# Patient Record
Sex: Female | Born: 1940 | Race: Black or African American | Hispanic: No | Marital: Single | Smoking: Former smoker
Health system: Southern US, Community
[De-identification: ages and names within clinical notes are randomized; demographics above are authoritative.]

## PROBLEM LIST (undated history)

## (undated) ENCOUNTER — Emergency Department (HOSPITAL_COMMUNITY): Discharge status: Against Medical Advice | Payer: Medicare Other

## (undated) DIAGNOSIS — Z720 Tobacco use: Secondary | ICD-10-CM

## (undated) DIAGNOSIS — I255 Ischemic cardiomyopathy: Secondary | ICD-10-CM

## (undated) DIAGNOSIS — I10 Essential (primary) hypertension: Secondary | ICD-10-CM

## (undated) DIAGNOSIS — E119 Type 2 diabetes mellitus without complications: Secondary | ICD-10-CM

## (undated) DIAGNOSIS — R1310 Dysphagia, unspecified: Secondary | ICD-10-CM

## (undated) DIAGNOSIS — A0472 Enterocolitis due to Clostridium difficile, not specified as recurrent: Secondary | ICD-10-CM

## (undated) DIAGNOSIS — I639 Cerebral infarction, unspecified: Secondary | ICD-10-CM

## (undated) DIAGNOSIS — D649 Anemia, unspecified: Secondary | ICD-10-CM

## (undated) DIAGNOSIS — J449 Chronic obstructive pulmonary disease, unspecified: Secondary | ICD-10-CM

## (undated) DIAGNOSIS — J45909 Unspecified asthma, uncomplicated: Secondary | ICD-10-CM

## (undated) DIAGNOSIS — I252 Old myocardial infarction: Secondary | ICD-10-CM

## (undated) HISTORY — DX: Chronic obstructive pulmonary disease, unspecified: J44.9

## (undated) HISTORY — PX: APPENDECTOMY: SHX54

---

## 2009-07-13 DIAGNOSIS — I639 Cerebral infarction, unspecified: Secondary | ICD-10-CM

## 2009-07-13 HISTORY — DX: Cerebral infarction, unspecified: I63.9

## 2011-07-14 DIAGNOSIS — I252 Old myocardial infarction: Secondary | ICD-10-CM

## 2011-07-14 HISTORY — DX: Old myocardial infarction: I25.2

## 2012-06-26 ENCOUNTER — Inpatient Hospital Stay (HOSPITAL_COMMUNITY)
Admission: EM | Admit: 2012-06-26 | Discharge: 2012-07-04 | DRG: 208 | Disposition: A | Discharge status: Rehabilitation Facility | Payer: Medicare Other | Attending: Cardiovascular Disease | Admitting: Cardiovascular Disease

## 2012-06-26 ENCOUNTER — Emergency Department (HOSPITAL_COMMUNITY): Payer: Medicare Other

## 2012-06-26 ENCOUNTER — Encounter (HOSPITAL_COMMUNITY): Payer: Self-pay | Admitting: Emergency Medicine

## 2012-06-26 DIAGNOSIS — Z79899 Other long term (current) drug therapy: Secondary | ICD-10-CM

## 2012-06-26 DIAGNOSIS — IMO0002 Reserved for concepts with insufficient information to code with codable children: Secondary | ICD-10-CM

## 2012-06-26 DIAGNOSIS — K5900 Constipation, unspecified: Secondary | ICD-10-CM | POA: Diagnosis present

## 2012-06-26 DIAGNOSIS — F17200 Nicotine dependence, unspecified, uncomplicated: Secondary | ICD-10-CM | POA: Diagnosis present

## 2012-06-26 DIAGNOSIS — Z23 Encounter for immunization: Secondary | ICD-10-CM

## 2012-06-26 DIAGNOSIS — J45909 Unspecified asthma, uncomplicated: Secondary | ICD-10-CM | POA: Diagnosis present

## 2012-06-26 DIAGNOSIS — I959 Hypotension, unspecified: Secondary | ICD-10-CM

## 2012-06-26 DIAGNOSIS — J449 Chronic obstructive pulmonary disease, unspecified: Secondary | ICD-10-CM | POA: Diagnosis present

## 2012-06-26 DIAGNOSIS — J45901 Unspecified asthma with (acute) exacerbation: Secondary | ICD-10-CM | POA: Diagnosis present

## 2012-06-26 DIAGNOSIS — R5381 Other malaise: Secondary | ICD-10-CM | POA: Diagnosis present

## 2012-06-26 DIAGNOSIS — R1313 Dysphagia, pharyngeal phase: Secondary | ICD-10-CM | POA: Diagnosis present

## 2012-06-26 DIAGNOSIS — E119 Type 2 diabetes mellitus without complications: Secondary | ICD-10-CM

## 2012-06-26 DIAGNOSIS — I509 Heart failure, unspecified: Secondary | ICD-10-CM | POA: Diagnosis present

## 2012-06-26 DIAGNOSIS — Z720 Tobacco use: Secondary | ICD-10-CM | POA: Diagnosis present

## 2012-06-26 DIAGNOSIS — I5023 Acute on chronic systolic (congestive) heart failure: Secondary | ICD-10-CM

## 2012-06-26 DIAGNOSIS — I428 Other cardiomyopathies: Secondary | ICD-10-CM

## 2012-06-26 DIAGNOSIS — Z8673 Personal history of transient ischemic attack (TIA), and cerebral infarction without residual deficits: Secondary | ICD-10-CM

## 2012-06-26 DIAGNOSIS — I214 Non-ST elevation (NSTEMI) myocardial infarction: Secondary | ICD-10-CM

## 2012-06-26 DIAGNOSIS — R0902 Hypoxemia: Secondary | ICD-10-CM

## 2012-06-26 DIAGNOSIS — J9600 Acute respiratory failure, unspecified whether with hypoxia or hypercapnia: Secondary | ICD-10-CM

## 2012-06-26 DIAGNOSIS — I213 ST elevation (STEMI) myocardial infarction of unspecified site: Secondary | ICD-10-CM

## 2012-06-26 DIAGNOSIS — J441 Chronic obstructive pulmonary disease with (acute) exacerbation: Secondary | ICD-10-CM

## 2012-06-26 DIAGNOSIS — J9620 Acute and chronic respiratory failure, unspecified whether with hypoxia or hypercapnia: Principal | ICD-10-CM | POA: Diagnosis present

## 2012-06-26 DIAGNOSIS — J9690 Respiratory failure, unspecified, unspecified whether with hypoxia or hypercapnia: Secondary | ICD-10-CM

## 2012-06-26 HISTORY — DX: Essential (primary) hypertension: I10

## 2012-06-26 HISTORY — DX: Unspecified asthma, uncomplicated: J45.909

## 2012-06-26 HISTORY — DX: Type 2 diabetes mellitus without complications: E11.9

## 2012-06-26 HISTORY — DX: Cerebral infarction, unspecified: I63.9

## 2012-06-26 HISTORY — DX: Tobacco use: Z72.0

## 2012-06-26 LAB — CBC WITH DIFFERENTIAL/PLATELET
Eosinophils Relative: 3 % (ref 0–5)
Lymphocytes Relative: 21 % (ref 12–46)
Lymphs Abs: 3.1 10*3/uL (ref 0.7–4.0)
MCV: 91.5 fL (ref 78.0–100.0)
Neutro Abs: 10.3 10*3/uL — ABNORMAL HIGH (ref 1.7–7.7)
Neutrophils Relative %: 71 % (ref 43–77)
Platelets: 337 10*3/uL (ref 150–400)
RBC: 4.35 MIL/uL (ref 3.87–5.11)
WBC: 14.6 10*3/uL — ABNORMAL HIGH (ref 4.0–10.5)

## 2012-06-26 MED ORDER — SODIUM CHLORIDE 0.9 % IV BOLUS (SEPSIS)
2000.0000 mL | Freq: Once | INTRAVENOUS | Status: AC
  Administered 2012-06-26: 1000 mL via INTRAVENOUS

## 2012-06-26 MED ORDER — DEXTROSE 5 % IV SOLN
1.0000 g | Freq: Once | INTRAVENOUS | Status: AC
  Administered 2012-06-26: 1 g via INTRAVENOUS
  Filled 2012-06-26: qty 10

## 2012-06-26 MED ORDER — METHYLPREDNISOLONE SODIUM SUCC 125 MG IJ SOLR
125.0000 mg | Freq: Once | INTRAMUSCULAR | Status: AC
  Administered 2012-06-26: 125 mg via INTRAVENOUS
  Filled 2012-06-26: qty 2

## 2012-06-26 MED ORDER — IPRATROPIUM BROMIDE 0.02 % IN SOLN
0.5000 mg | Freq: Once | RESPIRATORY_TRACT | Status: AC
  Administered 2012-06-26: 0.5 mg via RESPIRATORY_TRACT
  Filled 2012-06-26: qty 2.5

## 2012-06-26 MED ORDER — SODIUM CHLORIDE 0.9 % IV SOLN
INTRAVENOUS | Status: DC
  Administered 2012-06-27 – 2012-06-28 (×3): via INTRAVENOUS

## 2012-06-26 MED ORDER — ASPIRIN 81 MG PO CHEW
324.0000 mg | CHEWABLE_TABLET | Freq: Once | ORAL | Status: AC
  Administered 2012-06-26: 324 mg via ORAL
  Filled 2012-06-26: qty 4

## 2012-06-26 MED ORDER — DEXTROSE 5 % IV SOLN: 500.0000 mg | Freq: Once | INTRAVENOUS | Status: DC

## 2012-06-26 MED ORDER — ALBUTEROL SULFATE (5 MG/ML) 0.5% IN NEBU
10.0000 mg | INHALATION_SOLUTION | Freq: Once | RESPIRATORY_TRACT | Status: AC
  Administered 2012-06-26: 10 mg via RESPIRATORY_TRACT
  Filled 2012-06-26: qty 2

## 2012-06-26 MED ORDER — HEPARIN SODIUM (PORCINE) 5000 UNIT/ML IJ SOLN
60.0000 [IU]/kg | INTRAMUSCULAR | Status: AC
  Administered 2012-06-26: 3500 [IU] via INTRAVENOUS
  Filled 2012-06-26: qty 1

## 2012-06-26 MED ORDER — SODIUM CHLORIDE 0.9 % IV SOLN
INTRAVENOUS | Status: DC
  Administered 2012-06-27 – 2012-06-29 (×2): via INTRAVENOUS

## 2012-06-26 NOTE — ED Notes (Signed)
Patient with severe respiratory distress. States started suddenly tonight.

## 2012-06-26 NOTE — ED Provider Notes (Signed)
History   This chart was scribed for Hurman Horn, MD, by Frederik Pear, ER scribe. The patient was seen in room APA19/APA19 and the patient's care was started at 2311.    CSN: [id number]  Arrival date & time 06/26/12  2259   First MD Initiated Contact with Patient 06/26/12 2311      Chief Complaint  Patient presents with  . Respiratory Distress    (Consider location/radiation/quality/duration/timing/severity/associated sxs/prior treatment) HPI Comments: Christine Durham is a 71 y.o. female who presents to the Emergency Department complaining of gradually worsening, constant SOB that began around 1700. She has a h/o of mild asthma and denies any chronic health conditions except for mild asthma. She denies any nausea, emesis, diarrhea, confusion, hematochezia, or fever. She reports that she is a current, every day smoker, but has cut back recently. She is a Level 5 Caveat due to her unstable vital signs. No CP, fever, edema.  No treatment PTA.  SOB severe, started gradually.  Past Medical History  Diagnosis Date  . Stroke   . Diabetes mellitus without complication   . Asthma     Past Surgical History  Procedure Date  . Appendectomy     History reviewed. No pertinent family history.  History  Substance Use Topics  . Smoking status: Current Every Day Smoker  . Smokeless tobacco: Not on file  . Alcohol Use: No    OB History    Grav Para Term Preterm Abortions TAB SAB Ect Mult Living                  Review of Systems  Unable to perform ROS: Unstable vital signs    Allergies  Review of patient's allergies indicates no known allergies.  Home Medications  No current outpatient prescriptions on file.  BP 150/82  Pulse 116  Resp 31  Wt 129 lb (58.514 kg)  SpO2 100%  Physical Exam  Nursing note and vitals reviewed. Constitutional:       Awake, alert, nontoxic appearance.  HENT:  Head: Atraumatic.  Eyes: Right eye exhibits no discharge. Left eye exhibits no  discharge.  Neck: Neck supple.  Pulmonary/Chest: Effort normal. She has wheezes. She exhibits no tenderness.       She is hypoxic. Her pulse oximeter was in the 50's on room and and in the 80's on a 100% mask.  Abdominal: Soft. There is no tenderness. There is no rebound.  Musculoskeletal: She exhibits no edema and no tenderness.       Baseline ROM, no obvious new focal weakness.  Neurological:       Mental status and motor strength appears baseline for patient and situation.  Skin: No rash noted.  Psychiatric: She has a normal mood and affect.  Moderately severe resp distress upon arrival with hypoxia.  ED Course  Procedures (including critical care time) ECG: Sinus tachycardia, ventricular rate 119, left axis deviation, left anterior fascicular block, questionable slight ST elevation inferior leads versus respiratory variation, 2 mm ST elevation in leads V3 through V5, Q waves in V3 and V4,  No comparison ECG available DIAGNOSTIC STUDIES: Oxygen Saturation is 81% on 100% mask, low by my interpretation.    COORDINATION OF CARE:  23:14- Patient / Family / Caregiver understand and agree with initial ED impression and plan with expectations set for ED visit.  Code STEMI activated as soon as ECG shown to me; Pt arrived with several hours of severe respiratory distress and hypoxia requiring emergent intervention with Bipap  and breathing treatment, also without CP, so ECG not obtained immediately upon arrival.  CRITICAL CARE Performed by: Hurman Horn   Total critical care time:  Critical care time was exclusive of separately billable procedures and treating other patients.  Critical care was necessary to treat or prevent imminent or life-threatening deterioration.  Critical care was time spent personally by me on the following activities: development of treatment plan with patient and/or surrogate as well as nursing, discussions with consultants, evaluation of patient's response  to treatment, examination of patient, obtaining history from patient or surrogate, ordering and performing treatments and interventions, ordering and review of laboratory studies, ordering and review of radiographic studies, pulse oximetry and re-evaluation of patient's condition.  Labs Reviewed  CBC WITH DIFFERENTIAL - Abnormal; Notable for the following:    WBC 14.6 (*)     Neutro Abs 10.3 (*)     All other components within normal limits  COMPREHENSIVE METABOLIC PANEL - Abnormal; Notable for the following:    Glucose, Bld 217 (*)     Total Bilirubin 0.2 (*)     All other components within normal limits  TROPONIN I - Abnormal; Notable for the following:    Troponin I 0.39 (*)     All other components within normal limits  PRO B NATRIURETIC PEPTIDE - Abnormal; Notable for the following:    Pro B Natriuretic peptide (BNP) 150.2 (*)     All other components within normal limits  LACTIC ACID, PLASMA - Abnormal; Notable for the following:    Lactic Acid, Venous 2.4 (*)     All other components within normal limits  PROTIME-INR  APTT  CULTURE, BLOOD (ROUTINE X 2)  CULTURE, BLOOD (ROUTINE X 2)  URINALYSIS, ROUTINE W REFLEX MICROSCOPIC  URINE CULTURE   Dg Chest Port 1 View  06/26/2012  *RADIOLOGY REPORT*  Clinical Data: Shortness of breath.  PORTABLE CHEST - 1 VIEW  Comparison: None.  Findings: Adequately inflated without focal consolidation or effusion.  There is mild prominence of the perihilar markings which may be due to minimal vascular congestion.  Cardiomediastinal silhouette is within normal.  There is minimal spondylosis of the spine.  IMPRESSION: Suggestion of minimal vascular congestion.   Original Report Authenticated By: Elberta Fortis, M.D.      1. STEMI (ST elevation myocardial infarction)   2. Respiratory failure   3. Hypoxia   4. COPD exacerbation       MDM  The patient appears reasonably stabilized for transfer considering the current resources, flow, and  capabilities available in the ED at this time, and I doubt any other [facility] requiring further screening and/or treatment in the ED prior to transfer.  I personally performed the services described in this documentation, which was scribed in my presence. The recorded information has been reviewed and is accurate.        Hurman Horn, MD 06/27/12 2232

## 2012-06-27 ENCOUNTER — Emergency Department (HOSPITAL_COMMUNITY): Payer: Medicare Other

## 2012-06-27 ENCOUNTER — Inpatient Hospital Stay (HOSPITAL_COMMUNITY): Payer: Medicare Other

## 2012-06-27 ENCOUNTER — Encounter (HOSPITAL_COMMUNITY): Payer: Self-pay | Admitting: Anesthesiology

## 2012-06-27 ENCOUNTER — Encounter (HOSPITAL_COMMUNITY)
Admission: EM | Disposition: A | Discharge status: Rehabilitation Facility | Payer: Self-pay | Source: Home / Self Care | Attending: Cardiovascular Disease

## 2012-06-27 DIAGNOSIS — R0902 Hypoxemia: Secondary | ICD-10-CM

## 2012-06-27 DIAGNOSIS — I509 Heart failure, unspecified: Secondary | ICD-10-CM

## 2012-06-27 DIAGNOSIS — I2510 Atherosclerotic heart disease of native coronary artery without angina pectoris: Secondary | ICD-10-CM

## 2012-06-27 DIAGNOSIS — I219 Acute myocardial infarction, unspecified: Secondary | ICD-10-CM

## 2012-06-27 DIAGNOSIS — J441 Chronic obstructive pulmonary disease with (acute) exacerbation: Secondary | ICD-10-CM

## 2012-06-27 DIAGNOSIS — I5023 Acute on chronic systolic (congestive) heart failure: Secondary | ICD-10-CM

## 2012-06-27 DIAGNOSIS — J9600 Acute respiratory failure, unspecified whether with hypoxia or hypercapnia: Secondary | ICD-10-CM

## 2012-06-27 HISTORY — PX: LEFT HEART CATHETERIZATION WITH CORONARY ANGIOGRAM: SHX5451

## 2012-06-27 LAB — COMPREHENSIVE METABOLIC PANEL
ALT: 15 U/L (ref 0–35)
ALT: 11 U/L (ref 0–35)
AST: 15 U/L (ref 0–37)
Albumin: 2.3 g/dL — ABNORMAL LOW (ref 3.5–5.2)
Alkaline Phosphatase: 116 U/L (ref 39–117)
Alkaline Phosphatase: 77 U/L (ref 39–117)
BUN: 8 mg/dL (ref 6–23)
CO2: 28 mEq/L (ref 19–32)
Chloride: 97 mEq/L (ref 96–112)
Chloride: 106 mEq/L (ref 96–112)
GFR calc Af Amer: >90 mL/min (ref >90)
GFR calc non Af Amer: >90 mL/min (ref >90)
Glucose, Bld: 217 mg/dL — ABNORMAL HIGH (ref 70–99)
Potassium: 3.6 mEq/L (ref 3.5–5.1)
Potassium: 4 mEq/L (ref 3.5–5.1)
Sodium: 136 mEq/L (ref 135–145)
Sodium: 135 mEq/L (ref 135–145)
Total Bilirubin: 0.1 mg/dL — ABNORMAL LOW (ref 0.3–1.2)
Total Protein: 5.2 g/dL — ABNORMAL LOW (ref 6.0–8.3)

## 2012-06-27 LAB — URINALYSIS, ROUTINE W REFLEX MICROSCOPIC
Bilirubin Urine: NEGATIVE
Bilirubin Urine: NEGATIVE
Glucose, UA: 250 mg/dL — AB
Ketones, ur: NEGATIVE mg/dL
Ketones, ur: NEGATIVE mg/dL
Leukocytes, UA: NEGATIVE
Protein, ur: 30 mg/dL — AB
Specific Gravity, Urine: 1.03 (ref 1.005–1.030)
Specific Gravity, Urine: 1.042 — ABNORMAL HIGH (ref 1.005–1.030)
Urobilinogen, UA: 0.2 mg/dL (ref 0.0–1.0)
pH: 5 (ref 5.0–8.0)

## 2012-06-27 LAB — URINE MICROSCOPIC-ADD ON

## 2012-06-27 LAB — BLOOD GAS, ARTERIAL
Bicarbonate: 22.2 mEq/L (ref 20.0–24.0)
PEEP: 5 cmH2O
pCO2 arterial: 53.7 mmHg — ABNORMAL HIGH (ref 35.0–45.0)
pH, Arterial: 7.24 — ABNORMAL LOW (ref 7.350–7.450)
pO2, Arterial: 189 mmHg — ABNORMAL HIGH (ref 80.0–100.0)

## 2012-06-27 LAB — TROPONIN I
Troponin I: 1.63 ng/mL (ref <0.30)
Troponin I: 1.74 ng/mL (ref <0.30)
Troponin I: 1.64 ng/mL (ref <0.30)

## 2012-06-27 LAB — CBC
HCT: 36 % (ref 36.0–46.0)
HCT: 30 % — ABNORMAL LOW (ref 36.0–46.0)
Hemoglobin: 11.7 g/dL — ABNORMAL LOW (ref 12.0–15.0)
RBC: 3.93 MIL/uL (ref 3.87–5.11)
RDW: 13.7 % (ref 11.5–15.5)
WBC: 12 10*3/uL — ABNORMAL HIGH (ref 4.0–10.5)

## 2012-06-27 LAB — POCT ACTIVATED CLOTTING TIME: Activated Clotting Time: 187 seconds

## 2012-06-27 LAB — CREATININE, SERUM
Creatinine, Ser: 0.6 mg/dL (ref 0.50–1.10)
GFR calc non Af Amer: >90 mL/min (ref >90)

## 2012-06-27 LAB — APTT: aPTT: 30 seconds (ref 24–37)

## 2012-06-27 LAB — GLUCOSE, CAPILLARY
Glucose-Capillary: 119 mg/dL — ABNORMAL HIGH (ref 70–99)
Glucose-Capillary: 139 mg/dL — ABNORMAL HIGH (ref 70–99)
Glucose-Capillary: 133 mg/dL — ABNORMAL HIGH (ref 70–99)

## 2012-06-27 SURGERY — LEFT HEART CATHETERIZATION WITH CORONARY ANGIOGRAM
Anesthesia: LOCAL | Laterality: Right

## 2012-06-27 MED ORDER — GLUCERNA 1.2 CAL PO LIQD
1000.0000 mL | ORAL | Status: DC
  Administered 2012-06-27 – 2012-06-28 (×2): 1000 mL
  Filled 2012-06-27 (×4): qty 1000

## 2012-06-27 MED ORDER — PRO-STAT SUGAR FREE PO LIQD
30.0000 mL | Freq: Four times a day (QID) | ORAL | Status: DC
  Administered 2012-06-27 – 2012-06-28 (×7): 30 mL
  Filled 2012-06-27 (×11): qty 30

## 2012-06-27 MED ORDER — INSULIN ASPART 100 UNIT/ML ~~LOC~~ SOLN
0.0000 [IU] | SUBCUTANEOUS | Status: DC
  Administered 2012-06-27 (×2): 2 [IU] via SUBCUTANEOUS
  Administered 2012-06-27: 8 [IU] via SUBCUTANEOUS
  Administered 2012-06-27: 5 [IU] via SUBCUTANEOUS
  Administered 2012-06-27: 3 [IU] via SUBCUTANEOUS
  Administered 2012-06-28: 2 [IU] via SUBCUTANEOUS
  Administered 2012-06-28: 3 [IU] via SUBCUTANEOUS
  Administered 2012-06-28: 2 [IU] via SUBCUTANEOUS
  Administered 2012-06-28: 3 [IU] via SUBCUTANEOUS
  Administered 2012-06-28 (×2): 2 [IU] via SUBCUTANEOUS
  Administered 2012-06-29: 3 [IU] via SUBCUTANEOUS
  Administered 2012-06-29 (×4): 2 [IU] via SUBCUTANEOUS
  Administered 2012-06-29 – 2012-06-30 (×2): 3 [IU] via SUBCUTANEOUS
  Administered 2012-06-30: 11 [IU] via SUBCUTANEOUS

## 2012-06-27 MED ORDER — LEVALBUTEROL TARTRATE 45 MCG/ACT IN AERO
6.0000 | INHALATION_SPRAY | Freq: Four times a day (QID) | RESPIRATORY_TRACT | Status: DC
  Administered 2012-06-27 – 2012-06-29 (×9): 6 via RESPIRATORY_TRACT

## 2012-06-27 MED ORDER — SODIUM CHLORIDE 0.9 % IV SOLN: 250.0000 mL | INTRAVENOUS | Status: DC | PRN

## 2012-06-27 MED ORDER — ADULT MULTIVITAMIN LIQUID CH
5.0000 mL | Freq: Every day | ORAL | Status: DC
  Administered 2012-06-27 – 2012-06-30 (×3): 5 mL
  Filled 2012-06-27 (×4): qty 5

## 2012-06-27 MED ORDER — IPRATROPIUM BROMIDE HFA 17 MCG/ACT IN AERS
2.0000 | INHALATION_SPRAY | RESPIRATORY_TRACT | Status: DC
  Filled 2012-06-27: qty 12.9

## 2012-06-27 MED ORDER — METHYLPREDNISOLONE SODIUM SUCC 125 MG IJ SOLR
60.0000 mg | Freq: Two times a day (BID) | INTRAMUSCULAR | Status: DC
  Administered 2012-06-27 (×2): 60 mg via INTRAVENOUS
  Administered 2012-06-28: 10:00:00 via INTRAVENOUS
  Administered 2012-06-28 – 2012-06-30 (×4): 60 mg via INTRAVENOUS
  Filled 2012-06-27 (×4): qty 0.96
  Filled 2012-06-27: qty 2
  Filled 2012-06-27 (×4): qty 0.96

## 2012-06-27 MED ORDER — MIDAZOLAM BOLUS VIA INFUSION
1.0000 mg | INTRAVENOUS | Status: DC | PRN
  Filled 2012-06-27: qty 2

## 2012-06-27 MED ORDER — SODIUM CHLORIDE 0.9 % IV SOLN: 1.0000 mg/h | INTRAVENOUS | Status: DC

## 2012-06-27 MED ORDER — SODIUM CHLORIDE 0.9 % IV SOLN
1.0000 mg/h | INTRAVENOUS | Status: DC
  Administered 2012-06-27: 2 mg/h via INTRAVENOUS
  Administered 2012-06-27: 4 mg/h via INTRAVENOUS
  Administered 2012-06-28 (×2): 2 mg/h via INTRAVENOUS
  Filled 2012-06-27 (×6): qty 10

## 2012-06-27 MED ORDER — LIDOCAINE HCL (PF) 1 % IJ SOLN
INTRAMUSCULAR | Status: AC
  Filled 2012-06-27: qty 30

## 2012-06-27 MED ORDER — IPRATROPIUM BROMIDE HFA 17 MCG/ACT IN AERS
6.0000 | INHALATION_SPRAY | Freq: Four times a day (QID) | RESPIRATORY_TRACT | Status: DC
  Administered 2012-06-27 – 2012-06-29 (×10): 6 via RESPIRATORY_TRACT

## 2012-06-27 MED ORDER — HEPARIN (PORCINE) IN NACL 2-0.9 UNIT/ML-% IJ SOLN
INTRAMUSCULAR | Status: AC
  Filled 2012-06-27: qty 1000

## 2012-06-27 MED ORDER — INSULIN ASPART 100 UNIT/ML ~~LOC~~ SOLN: 1.0000 [IU] | SUBCUTANEOUS | Status: DC | PRN

## 2012-06-27 MED ORDER — BIOTENE DRY MOUTH MT LIQD
15.0000 mL | Freq: Four times a day (QID) | OROMUCOSAL | Status: DC
  Administered 2012-06-27 – 2012-06-29 (×9): 15 mL via OROMUCOSAL

## 2012-06-27 MED ORDER — SODIUM CHLORIDE 0.9 % IV SOLN
25.0000 ug/h | INTRAVENOUS | Status: DC
  Administered 2012-06-27: 75 ug/h via INTRAVENOUS
  Administered 2012-06-28: 50 ug/h via INTRAVENOUS
  Filled 2012-06-27 (×2): qty 50

## 2012-06-27 MED ORDER — SODIUM CHLORIDE 0.9 % IJ SOLN: 3.0000 mL | INTRAMUSCULAR | Status: DC | PRN

## 2012-06-27 MED ORDER — ASPIRIN-DIPYRIDAMOLE ER 25-200 MG PO CP12
1.0000 | ORAL_CAPSULE | Freq: Two times a day (BID) | ORAL | Status: DC
  Administered 2012-06-27 – 2012-07-04 (×13): 1 via ORAL
  Filled 2012-06-27 (×22): qty 1

## 2012-06-27 MED ORDER — SODIUM CHLORIDE 0.9 % IJ SOLN
3.0000 mL | Freq: Two times a day (BID) | INTRAMUSCULAR | Status: DC
  Administered 2012-06-27 (×2): 3 mL via INTRAVENOUS
  Administered 2012-06-28: 10:00:00 via INTRAVENOUS
  Administered 2012-06-29 – 2012-06-30 (×4): 3 mL via INTRAVENOUS
  Administered 2012-07-01: 10:00:00 via INTRAVENOUS
  Administered 2012-07-01 – 2012-07-04 (×6): 3 mL via INTRAVENOUS

## 2012-06-27 MED ORDER — INSULIN ASPART 100 UNIT/ML ~~LOC~~ SOLN: 2.0000 [IU] | SUBCUTANEOUS | Status: DC

## 2012-06-27 MED ORDER — ALBUTEROL SULFATE (5 MG/ML) 0.5% IN NEBU
2.5000 mg | INHALATION_SOLUTION | Freq: Once | RESPIRATORY_TRACT | Status: AC
  Administered 2012-06-27: 2.5 mg via RESPIRATORY_TRACT
  Filled 2012-06-27: qty 0.5

## 2012-06-27 MED ORDER — MIDAZOLAM HCL 2 MG/2ML IJ SOLN
INTRAMUSCULAR | Status: AC
  Filled 2012-06-27: qty 2

## 2012-06-27 MED ORDER — NITROGLYCERIN 0.2 MG/ML ON CALL CATH LAB
INTRAVENOUS | Status: AC
  Filled 2012-06-27: qty 1

## 2012-06-27 MED ORDER — DEXTROSE 5 % IV SOLN
1.0000 g | INTRAVENOUS | Status: DC
  Administered 2012-06-27 – 2012-07-01 (×5): 1 g via INTRAVENOUS
  Filled 2012-06-27 (×8): qty 10

## 2012-06-27 MED ORDER — SODIUM CHLORIDE 0.9 % IV SOLN: 10.0000 ug/h | INTRAVENOUS | Status: DC

## 2012-06-27 MED ORDER — CHLORHEXIDINE GLUCONATE 0.12 % MT SOLN
15.0000 mL | Freq: Two times a day (BID) | OROMUCOSAL | Status: DC
  Administered 2012-06-27 – 2012-06-29 (×5): 15 mL via OROMUCOSAL
  Filled 2012-06-27 (×5): qty 15

## 2012-06-27 MED ORDER — ENOXAPARIN SODIUM 40 MG/0.4ML ~~LOC~~ SOLN
40.0000 mg | SUBCUTANEOUS | Status: DC
  Administered 2012-06-27 – 2012-07-03 (×7): 40 mg via SUBCUTANEOUS
  Filled 2012-06-27 (×8): qty 0.4

## 2012-06-27 MED ORDER — SODIUM CHLORIDE 0.9 % IV SOLN: 750.0000 mL | INTRAVENOUS | Status: DC | PRN

## 2012-06-27 MED ORDER — LEVALBUTEROL TARTRATE 45 MCG/ACT IN AERO
6.0000 | INHALATION_SPRAY | RESPIRATORY_TRACT | Status: DC | PRN
  Administered 2012-06-27: 6 via RESPIRATORY_TRACT
  Filled 2012-06-27: qty 15

## 2012-06-27 MED ORDER — METHYLPREDNISOLONE SODIUM SUCC 1000 MG IJ SOLR: 1000.0000 mg | Freq: Four times a day (QID) | INTRAMUSCULAR | Status: DC

## 2012-06-27 MED ORDER — FENTANYL BOLUS VIA INFUSION
25.0000 ug | Freq: Four times a day (QID) | INTRAVENOUS | Status: DC | PRN
  Filled 2012-06-27: qty 100

## 2012-06-27 MED ORDER — DEXTROSE 5 % IV SOLN
500.0000 mg | INTRAVENOUS | Status: DC
  Administered 2012-06-27 – 2012-07-01 (×5): 500 mg via INTRAVENOUS
  Filled 2012-06-27 (×6): qty 500

## 2012-06-27 MED ORDER — LEVALBUTEROL TARTRATE 45 MCG/ACT IN AERO
2.0000 | INHALATION_SPRAY | Freq: Four times a day (QID) | RESPIRATORY_TRACT | Status: DC
  Filled 2012-06-27: qty 15

## 2012-06-27 MED ORDER — PANTOPRAZOLE SODIUM 40 MG IV SOLR
40.0000 mg | Freq: Every day | INTRAVENOUS | Status: DC
  Administered 2012-06-27 – 2012-06-28 (×2): 40 mg via INTRAVENOUS
  Filled 2012-06-27 (×2): qty 40

## 2012-06-27 NOTE — Consult Note (Signed)
Name: Christine Durham MRN: [id number] DOB: 09-23-1940    LOS: 1  Referring Provider:  Cardiology Reason for Referral:  VDRF  PULMONARY / CRITICAL CARE MEDICINE  HPI:  71 years old female with PMH relevant for asthma, smoking, DM and stroke. Patient unable to provide history due to intubation and mechanical ventilation. From the ED records she presented with progressive SOB that started at about 5 pm. EKG done in the ED showed STEMI in the anterolateral leads with initial troponin of 0.39. Apparently the patient had significant wheezing and was hypoxemic. Cardiology decided to proceed with intubation before proceeding with the cardiac catheterization. Critical care consult was called to assist with management of COPD Asthma and mechanical ventilation. At the time of my exam the patient is intubated, sedated. Hemodynamically stable. Cardiac cath did not show obstructive CAD. LVEF is 20%  Vital Signs: Temp:  [97.5 F (36.4 C)-97.7 F (36.5 C)] 97.5 F (36.4 C) (12/16 0800) Pulse Rate:  [79-121] 81  (12/16 1100) Resp:  [14-38] 16  (12/16 1100) BP: (79-162)/(41-139) 91/53 mmHg (12/16 1100) SpO2:  [81 %-100 %] 99 % (12/16 1100) FiO2 (%):  [40 %-100 %] 40 % (12/16 0800) Weight:  [54.9 kg (121 lb 0.5 oz)-58.514 kg (129 lb)] 54.9 kg (121 lb 0.5 oz) (12/16 0240)  Physical Examination: General:  Chronically ill appearing female, NAD. Neuro:  Arousable and follows commands. HEENT:  [place]/AT, PERRL, EOM-I and MMM. Neck:  Supple, -LAN and -thyromegally. Cardiovascular:  RRR, Nl S1/S2, -M/R/G. Lungs:  Coarse BS diffusely. Abdomen:  Soft, NT, ND and +BS. Musculoskeletal:  -edema and -tenderness. Skin:  Intact.  Principal Problem:  *Acute on chronic systolic CHF (congestive heart failure)   ASSESSMENT AND PLAN  PULMONARY  Lab 06/27/12 0400  PHART 7.240*  PCO2ART 53.7*  PO2ART 189.0*  HCO3 22.2  O2SAT 99.1   Ventilator Settings: Vent Mode:  [-] PRVC FiO2 (%):  [40 %-100 %] 40 % Set Rate:   [14 bmp-16 bmp] 16 bmp Vt Set:  [420 mL-500 mL] 420 mL PEEP:  [5 cmH20] 5 cmH20 Plateau Pressure:  [12 cmH20-20 cmH20] 12 cmH20 CXR:  ET tube ok, pulmonary edema. ETT:  12/15>>>  A:  VDRF secondary to pulmonary edema and laying flat as well as underlying pulmonary disease. P:   - Hold pressure support trials for today with active MI. - F/U ABG. - Active diureses. - BD as ordered.  CARDIOVASCULAR  Lab 06/27/12 0934 06/27/12 0251 06/26/12 2321 06/26/12 2320  TROPONINI 1.74* 1.63* -- 0.39*  LATICACIDVEN -- -- 2.4* --  PROBNP -- -- -- 150.2*   ECG:  Per cards. Lines: PIV  A: NSTEMI, nonobstructive lesions seen on cath.  Troponin continue to rise however and patient is aneuric. P:  - Per cards. - Monitor troponins.  RENAL  Lab 06/27/12 0250 06/26/12 2320  NA -- 136  K -- 3.6  CL -- 97  CO2 -- 28  BUN -- 7  CREATININE 0.60 0.50  CALCIUM -- 9.6  MG -- --  PHOS -- --   Intake/Output      12/15 0701 - 12/16 0700 12/16 0701 - 12/17 0700   I.V. (mL/kg) 510.5 (9.3) 513 (9.3)   IV Piggyback 250    Total Intake(mL/kg) 760.5 (13.9) 513 (9.3)   Urine (mL/kg/hr) 185 (0.1) 50 (0.2)   Total Output 185 50   Net +575.5 +463          Intake/Output Summary (Last 24 hours) at 06/27/12 1148 Last data filed  at 06/27/12 1100  Gross per 24 hour  Intake 1273.46 ml  Output    235 ml  Net 1038.46 ml   Foley:  12/15>>>  A:  Patient completely aneuric, cr ok and electrolytes WNL. P:   - Bladder scan, if negative then will order U/S of kidneys and hydrate more to avoid contrast nephropathy. - Renal U/S.  GASTROINTESTINAL  Lab 06/26/12 2320  AST 13  ALT 15  ALKPHOS 116  BILITOT 0.2*  PROT 8.0  ALBUMIN 3.8   A:  No acute P:   - TF per nutrition.  HEMATOLOGIC  Lab 06/27/12 0250 06/26/12 2320  HGB 11.7* 13.2  HCT 36.0 39.8  PLT 295 337  INR -- 0.97  APTT -- 30   A:  No active issues. P:  Daily CBC.  INFECTIOUS  Lab 06/27/12 0250 06/26/12 2320  WBC 15.6*  14.6*  PROCALCITON -- --   Cultures: Blood 12/16>>> Urine 12/16>>> Sputum 12/16>>> Antibiotics: Rocephin 12/16>>> Zithomax 12/16>>>  A:  ?CAP P:   - Pan culture. - F/U on culture. - Rocephin and zithromax.  ENDOCRINE  Lab 06/27/12 1120 06/27/12 0758 06/27/12 0324  GLUCAP 166* 235* 255*   A:  DM history.   P:   CBG and SSI.  NEUROLOGIC  A:  Sedation protocol. P:   Intermittent sedation.  CC time 50 min.  Koren Bound, M.D. Pulmonary and Critical Care Medicine [facility] Pager: [phone]  06/27/2012, 11:32 AM

## 2012-06-27 NOTE — Consult Note (Signed)
Name: Christine Durham MRN: [id number] DOB: 1940/09/14    LOS: 1  PULMONARY / CRITICAL CARE MEDICINE  HPI:   71 years old female with PMH relevant for asthma, smoking, DM and stroke. Patient unable to provide history due to intubation and mechanical ventilation. From the ED records she presented with progressive SOB that started at about 5 pm. EKG done in the ED showed STEMI in the anterolateral leads with initial troponin of 0.39. Apparently the patient had significant wheezing and was hypoxemic. Cardiology decided to proceed with intubation before proceeding with the cardiac catheterization. Critical care consult was called to assist with management of COPD  Asthma and mechanical ventilation. At the time of my exam the patient is intubated, sedated. Hemodynamically stable. Cardiac cath did not show obstructive CAD. LVEF is 20%  Past Medical History  Diagnosis Date  . Stroke   . Diabetes mellitus without complication   . Asthma    Past Surgical History  Procedure Date  . Appendectomy    Prior to Admission medications   Not on File   Allergies No Known Allergies  Family History History reviewed. No pertinent family history. Social History  reports that she has been smoking.  She does not have any smokeless tobacco history on file. She reports that she does not drink alcohol or use illicit drugs.  Review Of Systems:  Unable to provide.   Vital Signs: Pulse Rate:  [115-121] 116  (12/15 2342) Resp:  [30-38] 31  (12/15 2342) BP: (150-151)/(82-139) 150/82 mmHg (12/15 2342) SpO2:  [81 %-100 %] 100 % (12/15 2342) FiO2 (%):  [100 %] 100 % (12/15 2330) Weight:  [129 lb (58.514 kg)] 129 lb (58.514 kg) (12/15 2312)  Physical Examination: General:  Intubated, mechanically ventilated, no acute distress Neuro:  Sedated, synchronous, nonfocal HEENT:  PERRL, pink conjunctivae, moist membranes Neck:  Supple, no JVD   Cardiovascular:  RRR, no M/R/G Lungs:  CTA, mild bilateral  wheezing.. Abdomen:  Soft, nontender, nondistended, bowel sounds present Musculoskeletal:  Moves all extremities, no pedal edema Skin:  No rash  Laboratory data:  Lab 06/26/12 2321 06/26/12 2320 TROPONINI -- 0.39* LATICACIDVEN 2.4* -- PROBNP -- 150.2*   Lab 06/26/12 2320 NA 136 K 3.6 CL 97 CO2 28 BUN 7 CREATININE 0.50 CALCIUM 9.6 MG -- PHOS --   Lab 06/26/12 2320 AST 13 ALT 15 ALKPHOS 116 BILITOT 0.2* PROT 8.0 ALBUMIN 3.8   Lab 06/26/12 2320 HGB 13.2 HCT 39.8 PLT 337 INR 0.97 APTT 30   Lab 06/26/12 2320 WBC 14.6* PROCALCITON --   CXR:  No acute infiltrates ETT:  Adequate position  ASSESSMENT: 71 years old female with PMH relevant for asthma, smoking, DM and stroke. Presents with progressive SOB and hypoxemia. EKG with STEMI in the anterolateral leads and troponin elevation. Patient with diffuse wheezing and hypoxemia. Intubated before cardiac catheterization. Critical care consult called for mechanical ventilation and COPD exacerbation management. Cardiac cath did not show obstructive CAD but showed LVEF of 20% possibly stress cardiomyopathy.   RECOMMENDATIONS:  1) ST elevation in anterolateral leads and troponin elevation with LVEF of 20% possibly secondary to stress cardiomyopathy. 2) Asthma / COPD exacerbation 3) DM - Mechanical ventilation  - PRVC, Vt: 8cc/kg, PEEP: 5, RR: 16, FiO2 100% and adjust to keep O2 sat >94%  - VAP prevention order set  - Continuous sedation and pain management with Versed and Fentanyl.  - Daily awakening and SBT - Xopenex nebs - Ipratropium nebs. - GI prophylaxis with protonix - Will  follow serial troponin - Methylprednisolone 60 mg IV q12 - Rocephin / Zithromax - ICU hyperglycemia protocol with SQ Novolog - DVT prophylaxis with sq heparin.   Critical Care Time devoted to patient care services described in this note is: 1 Hour  Overton Mam, M.D. Pulmonary and Critical Care Medicine [facility] Pager: [phone]  06/27/2012, 1:57 AM

## 2012-06-27 NOTE — H&P (Signed)
History and Physical  Patient ID: Christine Durham MRN: [id number] DOB/AGE: Jul 08, 1941 71 y.o. Admit date: 06/26/2012  Primary Cardiologist : New.   HPI: This is a 71 year old African American female with no previous cardiac history. She has known history of COPD, tobacco use, type 2 diabetes and previous stroke in 2011. She presented to [facility] ER with acute onset of shortness of breath and wheezing around 5 PM today. There was no associated chest pain. The patient was noted to be hypoxic on presentation and tachycardic. EKG showed 2 mm of anterolateral ST elevation. A code STEMI was called. The patient was transferred for emergent cardiac catheterization. She was noted to be in significant respiratory distress upon presentation. She was given a breathing treatment and placed on BiPAP. However, she did not respond well and it was decided to intubate the patient before proceeding with cardiac catheterization. The patient has known history of diabetes treated with metformin. She had a previous stroke on Aggrenox.  Review of systems complete and found to be negative unless listed above  Past Medical History  Diagnosis Date  . Stroke   . Diabetes mellitus without complication   . Asthma     History reviewed. No pertinent family history.  History   Social History  . Marital Status: Single    Spouse Name: N/A    Number of Children: N/A  . Years of Education: N/A   Occupational History  . Not on file.   Social History Main Topics  . Smoking status: Current Every Day Smoker  . Smokeless tobacco: Not on file  . Alcohol Use: No  . Drug Use: No  . Sexually Active:    Other Topics Concern  . Not on file   Social History Narrative  . No narrative on file    Past Surgical History  Procedure Date  . Appendectomy      No prescriptions prior to admission    Physical Exam: Blood pressure 150/82, pulse 116, resp. rate 31, weight 58.514 kg (129 lb), SpO2 100.00%.   General: Patient is  in severe respiratory distress with accessory muscle use. Head: Normocephalic and atraumatic.  Eyes: Pupils are equal and round. Right eye exhibits no discharge. Left eye exhibits no discharge.  Neck: Normal range of motion. Neck supple. No JVD present. No thyromegaly present.  Cardiovascular: Tachycardic with regular rhythm. No gallops or murmurs.  Pulmonary/Chest: Severe respiratory distress with extensive expiratory wheezing. Abdominal: Soft. Bowel sounds are normal. She exhibits no distension. There is no tenderness. There is no rebound and no guarding.  Musculoskeletal: Normal range of motion. She exhibits no edema and no tenderness.  Neurological: She is alert and oriented to person, place, and time. Coordination normal.  Skin: Skin is warm and dry. No rash noted. She is not diaphoretic. No erythema. No pallor.  Psychiatric: She has a normal mood and affect. Her behavior is normal. Judgment and thought content normal.    Labs:   Lab Results  Component Value Date   WBC 14.6* 06/26/2012   HGB 13.2 06/26/2012   HCT 39.8 06/26/2012   MCV 91.5 06/26/2012   PLT 337 06/26/2012    Lab 06/26/12 2320  NA 136  K 3.6  CL 97  CO2 28  BUN 7  CREATININE 0.50  CALCIUM 9.6  PROT 8.0  BILITOT 0.2*  ALKPHOS 116  ALT 15  AST 13  GLUCOSE 217*   Lab Results  Component Value Date   TROPONINI 0.39* 06/26/2012    No  results found for this basename: CHOL   No results found for this basename: HDL   No results found for this basename: LDLCALC   No results found for this basename: TRIG   No results found for this basename: CHOLHDL   No results found for this basename: LDLDIRECT      Radiology: Chest x-ray showed pulmonary vascular congestion. EKG: Sinus tachycardia with 2 mm of ST elevation in V3 to V5 with Q waves.  ASSESSMENT AND PLAN:  1. Acute anterolateral ST elevation myocardial infarction: We'll proceed with emergent cardiac catheterization after stabilizing respiratory  status. She received a heparin bolus. 2. COPD exacerbation: The patient was intubated before cardiac catheterization.  She was ready given IV Solu-Medrol. She will need aggressive treatment of this. 3. Type 2 diabetes: Hold metformin for contrast exposure. 4. History of stroke.  Signed: Lorine Bears MD, [facility] 06/27/2012, 1:09 AM

## 2012-06-27 NOTE — ED Notes (Signed)
CRITICAL VALUE ALERT  Critical value received:  Troponin 0.39  Date of notification:  06/27/2012  Time of notification:  0012  Critical value read back:yes  Nurse who received alert:  Rudene Anda, RN  MD notified (1st page):  Dr. Fonnie Jarvis

## 2012-06-27 NOTE — Progress Notes (Signed)
CRITICAL VALUE ALERT  Critical value received:  Troponin  Date of notification:  06/27/2012  Time of notification:  0357  Critical value read back:yes  Nurse who received alert:  Sherry Ruffing, RN  MD notified (1st page):  [facility]  Time of first page:  [phone]  MD notified (2nd page):  Time of second page:  Responding MD:  Antoine Poche  Time MD responded:  [phone]

## 2012-06-27 NOTE — Progress Notes (Signed)
eLink Physician-Brief Progress Note Patient Name: Christine Durham DOB: Nov 11, 1940 MRN: [id number]  Date of Service  06/27/2012   HPI/Events of Note  Call from nurse reporting positive blood culture prior to txf from [facility] - GM + cocci.  Patient on Rocephin and zithromax   eICU Interventions  Plan: Pharmacy consult to initiate empiric vancomycin   Intervention Category Intermediate Interventions: Infection - evaluation and management  DETERDING,ELIZABETH 06/27/2012, 11:55 PM

## 2012-06-27 NOTE — Progress Notes (Signed)
INITIAL NUTRITION ASSESSMENT  DOCUMENTATION CODES Per approved criteria  -Not Applicable    INTERVENTION: 1. Once placement of OG tube has been verified: Initiate Glucerna 1.2 @ 25 ml/hr via OG and hold, this is the goal rate.  30 ml Prostat 4 times daily.  At goal rate, tube feeding regimen will provide 1120 kcal, 96 grams of protein, and 483 ml of H2O.  2. Multivitamin per tube 3. RD will continue to follow    NUTRITION DIAGNOSIS: Inadequate oral intake  related to inability to eat as evidenced by NPO.   Goal: Meet >/=90% estimated nutrition needs  Monitor:  Vent status, weight trends, labs, I/O's, TF tolerance    Reason for Assessment: consult, TF initiation and management   71 y.o. female  Admitting Dx: Acute on chronic systolic CHF (congestive heart failure)  ASSESSMENT:  Pt admitted with new onset SOB. Planned for cardiac cath, required intubation prior to cath for pulmonary edema. Pt now s/p cath, remains intubated. RD consulted for initiation of TF.  Currently CXR reports do not indicate placement of OG tube. Do not initiate until placement is verified.    Height: Ht Readings from Last 1 Encounters:  06/27/12 5\' 4"  (1.626 m)   Weight: Wt Readings from Last 1 Encounters:  06/27/12 121 lb 0.5 oz (54.9 kg)   Ideal Body Weight: 54.5 kg   % Ideal Body Weight: 101%  Wt Readings from Last 10 Encounters:  06/27/12 121 lb 0.5 oz (54.9 kg)  06/27/12 121 lb 0.5 oz (54.9 kg)   Usual Body Weight: no weight hx  % Usual Body Weight: -  BMI:  Body mass index is 20.78 kg/(m^2). WNL   Patient is currently intubated on ventilator support.  MV: 6.5 Temp:Temp (24hrs), Avg:97.6 F (36.4 C), Min:97.5 F (36.4 C), Max:97.7 F (36.5 C)  Propofol: none   Estimated Nutritional Needs: Kcal: 1109 Protein: 82-95 gm  Fluid: 1.2 L   Skin: intact   Diet Order: NPO  EDUCATION NEEDS: -No education needs identified at this time   Intake/Output Summary (Last 24 hours)  at 06/27/12 1318 Last data filed at 06/27/12 1200  Gross per 24 hour  Intake 1400.46 ml  Output    280 ml  Net 1120.46 ml   Last BM: PTA   Labs:   Lab 06/27/12 0250 06/26/12 2320  NA -- 136  K -- 3.6  CL -- 97  CO2 -- 28  BUN -- 7  CREATININE 0.60 0.50  CALCIUM -- 9.6  MG -- --  PHOS -- --  GLUCOSE -- 217*    CBG (last 3)   Basename 06/27/12 1120 06/27/12 0758 06/27/12 0324  GLUCAP 166* 235* 255*    Scheduled Meds:   . antiseptic oral rinse  15 mL Mouth Rinse QID  . azithromycin  500 mg Intravenous Q24H  . cefTRIAXone (ROCEPHIN)  IV  1 g Intravenous Q24H  . chlorhexidine  15 mL Mouth Rinse BID  . dipyridamole-aspirin  1 capsule Oral BID  . enoxaparin (LOVENOX) injection  40 mg Subcutaneous Q24H  . insulin aspart  0-15 Units Subcutaneous Q4H  . ipratropium  6 puff Inhalation Q6H  . levalbuterol  6 puff Inhalation Q6H  . methylPREDNISolone (SOLU-MEDROL) injection  60 mg Intravenous Q12H  . pantoprazole (PROTONIX) IV  40 mg Intravenous Daily  . sodium chloride  3 mL Intravenous Q12H   Continuous Infusions:   . sodium chloride 125 mL/hr at 06/27/12 0312  . sodium chloride 10 mL/hr at 06/27/12  3086  . fentaNYL infusion INTRAVENOUS Stopped (06/27/12 0848)  . midazolam (VERSED) infusion Stopped (06/27/12 0848)    Past Medical History  Diagnosis Date  . Stroke   . Diabetes mellitus without complication   . Asthma     Past Surgical History  Procedure Date  . Appendectomy       Clarene Duke RD, LDN Pager [phone] After Hours pager [phone]

## 2012-06-27 NOTE — CV Procedure (Signed)
    Cardiac Catheterization Procedure Note  Name: Christine Durham MRN: [id number] DOB: 12/02/1940  Procedure: Left Heart Cath, Selective Coronary Angiography, LV angiography  infarction  Indication: suspected anterolateral ST elevation myocardial infarction in the setting of COPD exacerbation.  Medications:    Contrast:  65 mL Omnipaque  Procedural details: The right groin was prepped, draped, and anesthetized with 1% lidocaine. Using modified Seldinger technique, a 6 French sheath was introduced into the right femoral artery. Standard Judkins catheters were used for coronary angiography and left ventriculography. Catheter exchanges were performed over a guidewire. The sheath was then removed and the site was closed with a Mynx device There were no immediate procedural complications. The patient was transferred to the post catheterization recovery area for further monitoring.   Procedural Findings:  Hemodynamics: AO:  95/50   mmHg LV:  95/15    mmHg LVEDP: 24  mmHg  Coronary angiography: Coronary dominance: Right   Left Main:  Normal  Left Anterior Descending (LAD):  Normal in size with tortuosity in the midsegment with a 40% stenosis in that area. Otherwise no evidence of obstructive disease.  1st diagonal (D1):  Large in size with minor irregularities.  2nd diagonal (D2):  Very small in size.  3rd diagonal (D3):  Very small in size.  Circumflex (LCx):  Normal in size and nondominant. The vessel has minor irregularities.  1st obtuse marginal:  High takeoff and small in size with minor irregularities.  2nd obtuse marginal:  Normal in size with no significant disease.  3rd obtuse marginal:  Medium in size with no significant disease.   Right Coronary Artery: Normal in size and dominant. There is 20% proximal stenosis. Otherwise no obstructive disease.  posterior descending artery: Medium in size with minor irregularities.  posterior lateral branch:  PL1 is small. PL 2 is  normal in size with no significant disease.  Left ventriculography: Left ventricular systolic function is severely , LVEF is estimated at 25% %, there is no significant mitral regurgitation . There is a tiny cyst of the mid distal anterior wall, apex and mid to distal inferior wall with hypercontractility of the basal segments. This appearance is highly suggestive of stress-induced cardiomyopathy.  Final Conclusions:  1. No evidence of obstructive coronary artery disease. 2. Moderately elevated left ventricular end-diastolic pressure. 3. 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  Recommendations: Stabilize respiratory status with treatment of COPD. Critical care consult. Gentle diuresis. Her blood pressure is too low at this time to add a beta blocker or an ACE inhibitor. These should be started subsequently.  Lorine Bears MD, [facility] 06/27/2012, 2:04 AM

## 2012-06-27 NOTE — Progress Notes (Signed)
Inpatient Diabetes Program Recommendations  AACE/ADA: New Consensus Statement on Inpatient Glycemic Control (2013)  Target Ranges:  Prepandial:   less than 140 mg/dL      Peak postprandial:   less than 180 mg/dL (1-2 hours)      Critically ill patients:  140 - 180 mg/dL   Inpatient Diabetes Program Recommendations HgbA1C: Please order to assess prehospital glucose control Thank you  Piedad Climes [facility] Inpatient Diabetes Coordinator [phone]

## 2012-06-27 NOTE — Progress Notes (Addendum)
PROGRESS NOTE  Subjective:   Christine Durham is a 71 yo with hx of COPD, DM, CVA who was transferred from [facility] with respiratory failure.  She was thought to have had a STEMI.  Cath last night revealed only mild - moderate CAD but did reveal a severely depressed LV EF of 20%.  She remains on the vent.  Objective:    Vital Signs:   Temp:  [97.7 F (36.5 C)] 97.7 F (36.5 C) (12/16 0240) Pulse Rate:  [82-121] 82  (12/16 0700) Resp:  [14-38] 16  (12/16 0700) BP: (88-162)/(51-139) 92/51 mmHg (12/16 0700) SpO2:  [81 %-100 %] 100 % (12/16 0740) FiO2 (%):  [40 %-100 %] 40 % (12/16 0740) Weight:  [121 lb 0.5 oz (54.9 kg)-129 lb (58.514 kg)] 121 lb 0.5 oz (54.9 kg) (12/16 0240)      24-hour weight change: Weight change:   Weight trends: Filed Weights   06/26/12 2312 06/27/12 0240  Weight: 129 lb (58.514 kg) 121 lb 0.5 oz (54.9 kg)    Intake/Output:  12/15 0701 - 12/16 0700 In: 635.5 [I.V.:385.5; IV Piggyback:250] Out: 185 [Urine:185]     Physical Exam: BP 92/51  Pulse 82  Temp 97.7 F (36.5 C) (Oral)  Resp 16  Ht 5\' 4"  (1.626 m)  Wt 121 lb 0.5 oz (54.9 kg)  BMI 20.78 kg/m2  SpO2 100%  General: Vital signs reviewed and noted.   Head: Normocephalic, atraumatic.  Eyes: conjunctivae/corneas clear.  EOM's intact.   Throat: normal  Neck: 2+ carotids, no JVD  Lungs:    clear, on vent  Heart:  RR,   Abdomen:  Soft, non-tender, non-distended    Extremities: No edema   Neurologic: Sedated, on vent, did not respond  Psych:     Labs: BMET:  Basename 06/27/12 0250 06/26/12 2320  NA -- 136  K -- 3.6  CL -- 97  CO2 -- 28  GLUCOSE -- 217*  BUN -- 7  CREATININE 0.60 0.50  CALCIUM -- 9.6  MG -- --  PHOS -- --    Liver function tests:  Basename 06/26/12 2320  AST 13  ALT 15  ALKPHOS 116  BILITOT 0.2*  PROT 8.0  ALBUMIN 3.8   No results found for this basename: LIPASE:2,AMYLASE:2 in the last 72 hours  CBC:  Basename 06/27/12 0250 06/26/12 2320  WBC 15.6*  14.6*  NEUTROABS -- 10.3*  HGB 11.7* 13.2  HCT 36.0 39.8  MCV 91.6 91.5  PLT 295 337    Cardiac Enzymes:  Basename 06/27/12 0251 06/26/12 2320  CKTOTAL -- --  CKMB -- --  TROPONINI 1.63* 0.39*    Coagulation Studies:  Basename 06/26/12 2320  LABPROT 12.8  INR 0.97      Tele:  NSR  Medications:    Infusions:    . sodium chloride 125 mL/hr at 06/27/12 0312  . sodium chloride 10 mL/hr at 06/27/12 0313  . fentaNYL infusion INTRAVENOUS 75 mcg/hr (06/27/12 0500)  . midazolam (VERSED) infusion 2 mg/hr (06/27/12 0500)    Scheduled Medications:    . antiseptic oral rinse  15 mL Mouth Rinse QID  . azithromycin  500 mg Intravenous Q24H  . cefTRIAXone (ROCEPHIN)  IV  1 g Intravenous Q24H  . chlorhexidine  15 mL Mouth Rinse BID  . dipyridamole-aspirin  1 capsule Oral BID  . enoxaparin (LOVENOX) injection  40 mg Subcutaneous Q24H  . insulin aspart  0-15 Units Subcutaneous Q4H  . ipratropium  6 puff Inhalation Q6H  .  levalbuterol  6 puff Inhalation Q6H  . methylPREDNISolone (SOLU-MEDROL) injection  60 mg Intravenous Q12H  . pantoprazole (PROTONIX) IV  40 mg Intravenous Daily  . sodium chloride  3 mL Intravenous Q12H    Assessment/ Plan:    1. Acute on chronic systolic CHF:  She presented with respiratory failure- likely to do a combination of systolic CHF and COPD.  The ECG suggested STEMI but the cath did not reveal any acute MI.  I suspect the Troponin elevation is due to her CHF and decompensation.  Will continue current supportive care.  Her BP is low.  Will not start ACE inhibitor yet.  She may not tolerate a beta blocker due to her COPD.  The original diagnosis was STEMI.  There is no STEMI and I have changed the principle diagnosis to A/C systolic CHF.  She will not follow the STEMI pathway or have typical STEMI medications.  2. COPD :  Still on vent. Plans per PCCM  3. DM:  Will check accuchecks.  Holding metformin for now.  4.   Disposition: on vent,  management per PCCM. Length of Stay: 1  Vesta Mixer, Montez Hageman., MD, [facility] 06/27/2012, 7:47 AM Office [phone] Pager [phone]

## 2012-06-27 NOTE — Care Management Note (Addendum)
    Page 1 of 1   06/30/2012     2:35:09 PM   CARE MANAGEMENT NOTE 06/30/2012  Patient:  Christine Durham, Christine Durham   Account Number:  [id number]  Date Initiated:  06/27/2012  Documentation initiated by:  Junius Creamer  Subjective/Objective Assessment:   adm w chf, on vent     Action/Plan:   lives w fam   Anticipated DC Date:     Anticipated DC Plan:        DC Planning Services  CM consult      Choice offered to / List presented to:             Status of service:   Medicare Important Message given?   (If response is "NO", the following Medicare IM given date fields will be blank) Date Medicare IM given:   Date Additional Medicare IM given:    Discharge Disposition:    Per UR Regulation:  Reviewed for med. necessity/level of care/duration of stay  If discussed at Long Length of Stay Meetings, dates discussed:    Comments:  12/19 1434p debbie Jossue Rubenstein rn,bsn phy ther has seen pt and rec cir. have left sticky note for md if he agrees for cir consult.  12/16 12:37p debbie Makinzey Banes rn,bns [phone]

## 2012-06-27 NOTE — Anesthesia Preprocedure Evaluation (Signed)
Anesthesia Evaluation    Airway        Dental   Pulmonary           Cardiovascular      Neuro/Psych    GI/Hepatic   Endo/Other    Renal/GU      Musculoskeletal   Abdominal   Peds  Hematology   Anesthesia Other Findings   Reproductive/Obstetrics                              Anesthesia Physical Anesthesia Plan Anesthesia Quick Evaluation  

## 2012-06-27 NOTE — Procedures (Signed)
Central Venous Catheter Insertion Procedure Note Christine Durham [id number] 1941-06-16  Procedure: Insertion of Central Venous Catheter Indications: Assessment of intravascular volume, Drug and/or fluid administration and Frequent blood sampling  Procedure Details Consent: Risks of procedure as well as the alternatives and risks of each were explained to the (patient/caregiver).  Consent for procedure obtained. Time Out: Verified patient identification, verified procedure, site/side was marked, verified correct patient position, special equipment/implants available, medications/allergies/relevent history reviewed, required imaging and test results available.  Performed  Maximum sterile technique was used including antiseptics, cap, gloves, gown, hand hygiene, mask and sheet. Skin prep: Chlorhexidine; local anesthetic administered A antimicrobial bonded/coated triple lumen catheter was placed in the left subclavian vein using the Seldinger technique.  Evaluation Blood flow good Complications: No apparent complications Patient did tolerate procedure well. Chest X-ray ordered to verify placement.  CXR: pending.  BABCOCK,PETE 06/27/2012, 3:31 PM  U/S used in placement.  Patient seen and examined, agree with above note.  I dictated the care and orders written for this patient under my direction.  Koren Bound, M.D. [phone]

## 2012-06-28 ENCOUNTER — Inpatient Hospital Stay (HOSPITAL_COMMUNITY): Payer: Medicare Other

## 2012-06-28 DIAGNOSIS — I959 Hypotension, unspecified: Secondary | ICD-10-CM | POA: Diagnosis present

## 2012-06-28 DIAGNOSIS — J9600 Acute respiratory failure, unspecified whether with hypoxia or hypercapnia: Secondary | ICD-10-CM | POA: Diagnosis present

## 2012-06-28 DIAGNOSIS — E119 Type 2 diabetes mellitus without complications: Secondary | ICD-10-CM | POA: Diagnosis present

## 2012-06-28 DIAGNOSIS — I214 Non-ST elevation (NSTEMI) myocardial infarction: Secondary | ICD-10-CM | POA: Diagnosis present

## 2012-06-28 DIAGNOSIS — I428 Other cardiomyopathies: Secondary | ICD-10-CM | POA: Diagnosis present

## 2012-06-28 LAB — CBC
HCT: 31.1 % — ABNORMAL LOW (ref 36.0–46.0)
MCHC: 32.8 g/dL (ref 30.0–36.0)
Platelets: 272 10*3/uL (ref 150–400)
RDW: 14.1 % (ref 11.5–15.5)
WBC: 13 10*3/uL — ABNORMAL HIGH (ref 4.0–10.5)

## 2012-06-28 LAB — BASIC METABOLIC PANEL
BUN: 8 mg/dL (ref 6–23)
GFR calc Af Amer: >90 mL/min (ref >90)
GFR calc non Af Amer: >90 mL/min (ref >90)
Potassium: 4 mEq/L (ref 3.5–5.1)
Sodium: 134 mEq/L — ABNORMAL LOW (ref 135–145)

## 2012-06-28 LAB — BLOOD GAS, ARTERIAL
Acid-base deficit: 4.8 mmol/L — ABNORMAL HIGH (ref 0.0–2.0)
Bicarbonate: 20.9 mEq/L (ref 20.0–24.0)
Drawn by: 31101
FIO2: 40 %
RATE: 16 resp/min
pCO2 arterial: 45.9 mmHg — ABNORMAL HIGH (ref 35.0–45.0)
pO2, Arterial: 149 mmHg — ABNORMAL HIGH (ref 80.0–100.0)

## 2012-06-28 LAB — GLUCOSE, CAPILLARY: Glucose-Capillary: 126 mg/dL — ABNORMAL HIGH (ref 70–99)

## 2012-06-28 LAB — URINE CULTURE: Colony Count: NO GROWTH

## 2012-06-28 LAB — MAGNESIUM: Magnesium: 1.7 mg/dL (ref 1.5–2.5)

## 2012-06-28 MED ORDER — K PHOS MONO-SOD PHOS DI & MONO 155-852-130 MG PO TABS
250.0000 mg | ORAL_TABLET | Freq: Two times a day (BID) | ORAL | Status: AC
  Administered 2012-06-28 (×2): 250 mg via ORAL
  Filled 2012-06-28 (×4): qty 1

## 2012-06-28 MED ORDER — VANCOMYCIN HCL IN DEXTROSE 1-5 GM/200ML-% IV SOLN
1000.0000 mg | Freq: Once | INTRAVENOUS | Status: AC
  Administered 2012-06-28: 1000 mg via INTRAVENOUS
  Filled 2012-06-28: qty 200

## 2012-06-28 MED ORDER — PANTOPRAZOLE SODIUM 40 MG PO PACK
40.0000 mg | PACK | Freq: Every day | ORAL | Status: DC
  Administered 2012-06-30: 40 mg
  Filled 2012-06-28 (×2): qty 20

## 2012-06-28 MED ORDER — VANCOMYCIN HCL 1000 MG IV SOLR
750.0000 mg | Freq: Two times a day (BID) | INTRAVENOUS | Status: DC
  Administered 2012-06-28 (×2): 750 mg via INTRAVENOUS
  Filled 2012-06-28 (×4): qty 750

## 2012-06-28 MED ORDER — MAGNESIUM SULFATE 40 MG/ML IJ SOLN
2.0000 g | Freq: Once | INTRAMUSCULAR | Status: AC
  Administered 2012-06-28: 2 g via INTRAVENOUS
  Filled 2012-06-28: qty 50

## 2012-06-28 NOTE — Progress Notes (Signed)
ANTIBIOTIC CONSULT NOTE - INITIAL  Pharmacy Consult for Vancomycin Indication: bacteremia  No Known Allergies  Patient Measurements: Height: 5\' 4"  (162.6 cm) Weight: 121 lb 0.5 oz (54.9 kg) IBW/kg (Calculated) : 54.7   Vital Signs: Temp: 97.4 F (36.3 C) (12/16 2000) Temp src: Oral (12/16 2000) BP: 91/51 mmHg (12/16 2312) Pulse Rate: 63  (12/16 2312) Intake/Output from previous day: 12/16 0701 - 12/17 0700 In: 5355 [I.V.:5105; NG/GT:200; IV Piggyback:50] Out: 545 [Urine:545] Intake/Output from this shift: Total I/O In: 1428 [I.V.:1278; NG/GT:100; IV Piggyback:50] Out: 300 [Urine:300]  Labs:  [facility] 06/27/12 2315 06/27/12 0250 06/26/12 2320  WBC 12.0* 15.6* 14.6*  HGB 9.8* 11.7* 13.2  PLT 251 295 337  LABCREA -- -- --  CREATININE 0.43* 0.60 0.50   Estimated Creatinine Clearance: 57.3 ml/min (by C-G formula based on Cr of 0.43). No results found for this basename: VANCOTROUGH:2,VANCOPEAK:2,VANCORANDOM:2,GENTTROUGH:2,GENTPEAK:2,GENTRANDOM:2,TOBRATROUGH:2,TOBRAPEAK:2,TOBRARND:2,AMIKACINPEAK:2,AMIKACINTROU:2,AMIKACIN:2, in the last 72 hours   Microbiology: Recent Results (from the past 720 hour(s))  CULTURE, BLOOD (ROUTINE X 2)     Status: Normal (Preliminary result)   Collection Time   06/26/12 11:20 PM      Component Value Range Status Comment   Specimen Description BLOOD RIGHT ANTECUBITAL DRAWN BY RN   Final    Special Requests BOTTLES DRAWN AEROBIC AND ANAEROBIC 5CC   Final    Culture     Final    Value: GRAM POSITIVE COCCI     Gram Stain Report Called to,Read Back By and Verified With: BRODY J AT [place] AT 2347 ON [id number] BY FORSYTH K   Report Status PENDING   Incomplete   CULTURE, BLOOD (ROUTINE X 2)     Status: Normal (Preliminary result)   Collection Time   06/26/12 11:25 PM      Component Value Range Status Comment   Specimen Description Blood BLOOD RIGHT HAND DRAWN BY RN   Final    Special Requests BOTTLES DRAWN AEROBIC AND ANAEROBIC 5CC   Final     Culture NO GROWTH 1 DAY   Final    Report Status PENDING   Incomplete   MRSA PCR SCREENING     Status: Normal   Collection Time   06/27/12  3:08 AM      Component Value Range Status Comment   MRSA by PCR NEGATIVE  NEGATIVE Final     Medical History: Past Medical History  Diagnosis Date  . Stroke   . Diabetes mellitus without complication   . Asthma     Medications:  Scheduled:    . [COMPLETED] albuterol  2.5 mg Nebulization Once  . antiseptic oral rinse  15 mL Mouth Rinse QID  . azithromycin  500 mg Intravenous Q24H  . [COMPLETED] cefTRIAXone (ROCEPHIN) IVPB 1 gram/50 mL D5W  1 g Intravenous Once  . cefTRIAXone (ROCEPHIN)  IV  1 g Intravenous Q24H  . chlorhexidine  15 mL Mouth Rinse BID  . dipyridamole-aspirin  1 capsule Oral BID  . enoxaparin (LOVENOX) injection  40 mg Subcutaneous Q24H  . feeding supplement (GLUCERNA 1.2 CAL)  1,000 mL Per Tube Q24H  . feeding supplement  30 mL Per Tube QID  . [COMPLETED] heparin      . insulin aspart  0-15 Units Subcutaneous Q4H  . ipratropium  6 puff Inhalation Q6H  . levalbuterol  6 puff Inhalation Q6H  . [COMPLETED] lidocaine      . methylPREDNISolone (SOLU-MEDROL) injection  60 mg Intravenous Q12H  . [COMPLETED] midazolam      .  multivitamin  5 mL Per Tube Daily  . [COMPLETED] nitroGLYCERIN      . pantoprazole (PROTONIX) IV  40 mg Intravenous Daily  . sodium chloride  3 mL Intravenous Q12H  . [DISCONTINUED] azithromycin (ZITHROMAX) 500 MG IVPB  500 mg Intravenous Once  . [DISCONTINUED] insulin aspart  2-6 Units Subcutaneous Q4H  . [DISCONTINUED] ipratropium  2 puff Inhalation Q4H  . [DISCONTINUED] levalbuterol  2 puff Inhalation Q6H  . [DISCONTINUED] methylPREDNISolone sodium succinate  1,000 mg Intravenous Q6H   Assessment: 71 yo female with blood cultures growing GPC for empiric vancomycin  Goal of Therapy:  Vancomycin trough level 15-20 mcg/ml  Plan:  Vancomycin 1 g IV now, then 750 mg IV q12h  Eddie Candle 06/28/2012,12:01 AM

## 2012-06-28 NOTE — Progress Notes (Signed)
Name: Christine Durham MRN: [id number] DOB: Oct 22, 1940    LOS: 2  Referring Provider:  Cardiology Reason for Referral:  VDRF  PULMONARY / CRITICAL CARE MEDICINE  HPI:  71 years old female with PMH relevant for asthma, smoking, DM and stroke. Patient unable to provide history due to intubation and mechanical ventilation. From the ED records she presented with progressive SOB that started at about 5 pm. EKG done in the ED showed STEMI in the anterolateral leads with initial troponin of 0.39. Apparently the patient had significant wheezing and was hypoxemic. Cardiology decided to proceed with intubation before proceeding with the cardiac catheterization. Critical care consult was called to assist with management of COPD Asthma and mechanical ventilation. At the time of my exam the patient is intubated, sedated. Hemodynamically stable. Cardiac cath did not show obstructive CAD. LVEF is 20%  Vital Signs: Temp:  [97.4 F (36.3 C)-98.2 F (36.8 C)] 97.5 F (36.4 C) (12/17 0800) Pulse Rate:  [57-86] 71  (12/17 0857) Resp:  [14-24] 16  (12/17 0857) BP: (81-103)/(44-64) 95/44 mmHg (12/17 0857) SpO2:  [95 %-100 %] 99 % (12/17 0857) FiO2 (%):  [30 %-40 %] 40 % (12/17 0857) Weight:  [62 kg (136 lb 11 oz)] 62 kg (136 lb 11 oz) (12/17 0500)  Physical Examination: General:  Chronically ill appearing female, NAD. Neuro:  Arousable and follows commands. HEENT:  [place]/AT, PERRL, EOM-I and MMM. Neck:  Supple, -LAN and -thyromegally. Cardiovascular:  RRR, Nl S1/S2, -M/R/G. Lungs:  Coarse BS diffusely. Abdomen:  Soft, NT, ND and +BS. Musculoskeletal:  -edema and -tenderness. Skin:  Intact.  Principal Problem:  *Acute on chronic systolic CHF (congestive heart failure) Active Problems:  Acute respiratory failure  Diabetes mellitus without complication  NICM (nonischemic cardiomyopathy)  Hypotension  Non-ST elevation myocardial infarction (NSTEMI), initial care episode - Type 2 secondary to respiratory  issues   ASSESSMENT AND PLAN  PULMONARY  Lab 06/28/12 0500 06/27/12 0400  PHART 7.280* 7.240*  PCO2ART 45.9* 53.7*  PO2ART 149.0* 189.0*  HCO3 20.9 22.2  O2SAT 99.0 99.1   Ventilator Settings: Vent Mode:  [-] CPAP FiO2 (%):  [30 %-40 %] 40 % Set Rate:  [16 bmp] 16 bmp Vt Set:  [420 mL] 420 mL PEEP:  [5 cmH20] 5 cmH20 Pressure Support:  [10 cmH20] 10 cmH20 Plateau Pressure:  [13 cmH20-17 cmH20] 17 cmH20 CXR:  ET tube ok, pulmonary edema. ETT:  12/15>>>  A:  VDRF secondary to pulmonary edema and laying flat as well as underlying pulmonary disease. P:   - Begin PS trials today. - F/U ABG. - Hold diureses today but KVO IVF. - BD as ordered.  CARDIOVASCULAR  Lab 06/27/12 1528 06/27/12 0934 06/27/12 0251 06/26/12 2321 06/26/12 2320  TROPONINI 1.64* 1.74* 1.63* -- 0.39*  LATICACIDVEN -- -- -- 2.4* --  PROBNP -- -- -- -- 150.2*   ECG:  Per cards. Lines: PIV  A: NSTEMI, nonobstructive lesions seen on cath.  Troponin dropping even with deteriorating renal function. P:  - Per cards. - Monitor troponins.  RENAL  Lab 06/28/12 0430 06/27/12 2315 06/27/12 0250 06/26/12 2320  NA 134* 135 -- 136  K 4.0 4.0 -- --  CL 105 106 -- 97  CO2 22 23 -- 28  BUN 8 8 -- 7  CREATININE 0.44* 0.43* 0.60 0.50  CALCIUM 8.2* 7.8* -- 9.6  MG 1.7 -- -- --  PHOS 2.5 -- -- --   Intake/Output      12/16 0701 - 12/17 0700  12/17 0701 - 12/18 0700   I.V. (mL/kg) 6161 (99.4)    NG/GT 400    IV Piggyback 300    Total Intake(mL/kg) 6861 (110.7)    Urine (mL/kg/hr) 1080 (0.7)    Total Output 1080    Net +5781           Intake/Output Summary (Last 24 hours) at 06/28/12 0915 Last data filed at 06/28/12 0700  Gross per 24 hour  Intake   6602 ml  Output   1030 ml  Net   5572 ml   Foley:  12/15>>>  A:  Patient completely aneuric, cr ok and electrolytes WNL. P:   - Renal U/S negative for hydro, aneuria improved with hydration, CVP 12. - KVO IVF. - Hold lasix due to  hypotension.  GASTROINTESTINAL  Lab 06/27/12 2315 06/26/12 2320  AST 15 13  ALT 11 15  ALKPHOS 77 116  BILITOT 0.1* 0.2*  PROT 5.2* 8.0  ALBUMIN 2.3* 3.8   A:  No acute P:   - TF per nutrition.  HEMATOLOGIC  Lab 06/28/12 0430 06/27/12 2315 06/27/12 0250 06/26/12 2320  HGB 10.2* 9.8* 11.7* 13.2  HCT 31.1* 30.0* 36.0 39.8  PLT 272 251 295 337  INR -- -- -- 0.97  APTT -- -- -- 30   A:  No active issues. P:  Daily CBC.  INFECTIOUS  Lab 06/28/12 0430 06/27/12 2315 06/27/12 0250 06/26/12 2320  WBC 13.0* 12.0* 15.6* 14.6*  PROCALCITON -- -- -- --   Cultures: Blood 12/16>>>NTD Urine 12/16>>>NTD Sputum 12/16>>>NTD Antibiotics: Rocephin 12/16>>> Zithomax 12/16>>>  A:  ?CAP P:   - Pan culture. - F/U on culture. - Rocephin and zithromax.  ENDOCRINE  Lab 06/28/12 0807 06/28/12 0359 06/27/12 2331 06/27/12 1936 06/27/12 1546  GLUCAP 152* 142* 133* 139* 119*   A:  DM history.   P:   CBG and SSI.  NEUROLOGIC  A:  Sedation protocol. P:   Intermittent sedation.  CC time 35 min.  Koren Bound, M.D. Pulmonary and Critical Care Medicine [facility] Pager: [phone]  06/28/2012, 9:15 AM

## 2012-06-28 NOTE — Progress Notes (Signed)
Patient Name: Christine Durham Date of Encounter: 06/28/2012  Principal Problem:  *Acute on chronic systolic CHF (congestive heart failure) Active Problems:  Acute respiratory failure  NICM (nonischemic cardiomyopathy)  Diabetes mellitus without complication    SUBJECTIVE:  Awake on vent (still with some sedation going), agitated but responds to verbal commands.   OBJECTIVE:  CVP's since 4 pm yest: 9 -> 11 -> 10 -> 15 -> 10 this am  Filed Vitals:   06/28/12 0500 06/28/12 0600 06/28/12 0700 06/28/12 0800  BP: 93/55 99/53 88/47  95/44  Pulse: 86 75 67 63  Temp:    97.5 F (36.4 C)  TempSrc:    Oral  Resp: 15 16 16 16   Height:      Weight: 136 lb 11 oz (62 kg)     SpO2: 100% 99% 97% 98%    Intake/Output Summary (Last 24 hours) at 06/28/12 0849 Last data filed at 06/28/12 0700  Gross per 24 hour  Intake   6729 ml  Output   1030 ml  Net   5699 ml   Filed Weights   06/26/12 2312 06/27/12 0240 06/28/12 0500  Weight: 129 lb (58.514 kg) 121 lb 0.5 oz (54.9 kg) 136 lb 11 oz (62 kg)    PHYSICAL EXAM General: Well developed, slender female, agitated. Head: Normocephalic, atraumatic.  Neck: Supple without bruits, JVD unable to assess secondary to pt movement/equipment. Lungs:  Resp regular and unlabored, exp wheeze, rales. Heart: RRR, S1, S2, no S3, S4, or murmur. Abdomen: Soft, non-tender, non-distended, BS + x 4.  Extremities: No clubbing, cyanosis, no edema.  Neuro: Alert and oriented X 1 but agitated. Moves all extremities spontaneously.  LABS: CBC:  Basename 06/28/12 0430 06/27/12 2315 06/26/12 2320  WBC 13.0* 12.0* --  NEUTROABS -- -- 10.3*  HGB 10.2* 9.8* --  HCT 31.1* 30.0* --  MCV 92.3 91.7 --  PLT 272 251 --   INR:  Basename 06/26/12 2320  INR 0.97   Basic Metabolic Panel:  Basename 06/28/12 0430 06/27/12 2315  NA 134* 135  K 4.0 4.0  CL 105 106  CO2 22 23  GLUCOSE 271* 154*  BUN 8 8  CREATININE 0.44* 0.43*  CALCIUM 8.2* 7.8*  MG 1.7 --  PHOS  2.5 --   Liver Function Tests:  [facility] 06/27/12 2315 06/26/12 2320  AST 15 13  ALT 11 15  ALKPHOS 77 116  BILITOT 0.1* 0.2*  PROT 5.2* 8.0  ALBUMIN 2.3* 3.8   Cardiac Enzymes:  Basename 06/27/12 1528 06/27/12 0934 06/27/12 0251  CKTOTAL -- -- --  CKMB -- -- --  CKMBINDEX -- -- --  TROPONINI 1.64* 1.74* 1.63*   No results found for this basename: TROPIPOC:2 in the last 72 hours BNP: Pro B Natriuretic peptide (BNP)  Date/Time Value Range Status  06/26/2012 11:20 PM 150.2* 0 - 125 pg/mL Final    TELE:   SR     ECG: pending   Radiology/Studies:  Dg Chest Port 1 View 06/28/2012  *RADIOLOGY REPORT*  Clinical Data: Shortness of breath.  Assess endotracheal tube position.  PORTABLE CHEST - 1 VIEW  Comparison: 06/27/2012.  Findings: The support apparatus is stable.  Low lung volumes with vascular crowding and bibasilar atelectasis.  No large pleural effusion or pulmonary edema.  IMPRESSION:  1.  Stable support apparatus. 2.  Lower lung volumes with vascular crowding and bibasilar atelectasis.   Original Report Authenticated By: Rudie Meyer, M.D.    US Renal 06/27/2012  *RADIOLOGY REPORT*  Clinical Data: Acute renal failure.  Diabetes.  Rule out hydronephrosis.  RENAL/URINARY TRACT ULTRASOUND COMPLETE  Comparison:  None.  Findings:  Right Kidney:  The right kidney is isoechoic to the index organ, the liver.  It is somewhat small measuring 9.8 cm.  No focal lesions are present.  Left Kidney:  The left kidney is isoechoic to the index organ, the spleen.  There is no hydronephrosis.  No stones or mass lesions are present.  The maximal length is 10.0 cm.  Bladder:  A Foley catheter is in place.  IMPRESSION:  1.  Somewhat small hyperechoic kidneys bilaterally, compatible with chronic medical renal disease. 2.  No mass lesion or evidence for obstruction.   Original Report Authenticated By: Marin Roberts, M.D.    Dg Chest Port 1 View 06/27/2012  *RADIOLOGY REPORT*  Clinical Data:  Central line placement.  PORTABLE CHEST - 1 VIEW  Comparison: Chest x-ray 06/27/2012.  Findings: An endotracheal tube is in place with tip 3.4 cm above the carina. There is a left-sided subclavian central venous catheter with tip terminating in the distal superior vena cava. A nasogastric tube is seen extending into the stomach, however, the tip of the nasogastric tube extends below the lower margin of the image.  Lung volumes are slightly low.  Linear bibasilar opacities are favored to represent subsegmental atelectasis.  No definite pleural effusions.  Pulmonary vascular crowding related to low lung volumes, without frank pulmonary edema.  Heart size is normal. Mediastinal contours are unremarkable.  Atherosclerosis of the thoracic aorta.  IMPRESSION: 1.  Support apparatus, as above. 2.  Low lung volumes with probable bibasilar subsegmental atelectasis.   Original Report Authenticated By: Trudie Reed, M.D.    Dg Chest Port 1 View 06/27/2012  *RADIOLOGY REPORT*  Clinical Data: Check ET position  PORTABLE CHEST - 1 VIEW  Comparison: 06/26/2012  Findings: Endotracheal tube tip 3.7 cm proximal to the carina. Bibasilar opacities.  Interstitial coarsening.  Right hilar prominence.  No pneumothorax.  No definite pleural effusion. Cardiac contour within normal limits. No interval osseous change.  IMPRESSION: Endotracheal tube tip 3.7 cm proximal to the carina.  Bibasilar opacities; atelectasis, aspiration, or infiltrate.  Right hilar prominence is nonspecific, may reflect vascular congestion.  Recommend attention at two-view follow-up when the patient is clinically able.   Original Report Authenticated By: Jearld Lesch, M.D.    Dg Chest Port 1 View 06/26/2012  *RADIOLOGY REPORT*  Clinical Data: Shortness of breath.  PORTABLE CHEST - 1 VIEW  Comparison: None.  Findings: Adequately inflated without focal consolidation or effusion.  There is mild prominence of the perihilar markings which may be due to minimal  vascular congestion.  Cardiomediastinal silhouette is within normal.  There is minimal spondylosis of the spine.  IMPRESSION: Suggestion of minimal vascular congestion.   Original Report Authenticated By: Elberta Fortis, M.D.     Current Medications:    . antiseptic oral rinse  15 mL Mouth Rinse QID  . azithromycin  500 mg Intravenous Q24H  . cefTRIAXone (ROCEPHIN)  IV  1 g Intravenous Q24H  . chlorhexidine  15 mL Mouth Rinse BID  . dipyridamole-aspirin  1 capsule Oral BID  . enoxaparin (LOVENOX) injection  40 mg Subcutaneous Q24H  . feeding supplement (GLUCERNA 1.2 CAL)  1,000 mL Per Tube Q24H  . feeding supplement  30 mL Per Tube QID  . insulin aspart  0-15 Units Subcutaneous Q4H  . ipratropium  6 puff Inhalation Q6H  . levalbuterol  6 puff  Inhalation Q6H  . methylPREDNISolone (SOLU-MEDROL) injection  60 mg Intravenous Q12H  . multivitamin  5 mL Per Tube Daily  . pantoprazole (PROTONIX) IV  40 mg Intravenous Daily  . sodium chloride  3 mL Intravenous Q12H  . vancomycin  750 mg Intravenous Q12H      . sodium chloride 125 mL/hr at 06/28/12 0814  . sodium chloride 10 mL/hr at 06/27/12 0313  . fentaNYL infusion INTRAVENOUS 50 mcg/hr (06/27/12 2000)  . midazolam (VERSED) infusion 2 mg/hr (06/28/12 0000)    ASSESSMENT AND PLAN: Principal Problem:  *Acute on chronic systolic CHF (congestive heart failure) - no diuresis for now with hypotension and she may be volume depleted. EF low but BNP not elevated 12/15, will recheck in am. I/O strongly positive but UOP has improved, continue to follow volume status closely.  Active Problems:  Acute respiratory failure - Per CCM, may be able to extubate today   NICM (nonischemic cardiomyopathy) - cath yest without sig CAD, BP too low for ACE/BB for now, add when BP will allow.   Diabetes mellitus without complication - on SSI, CBGs likely higher due to steroids.  Hypotension - not on pressors, continue fluid but decrease rate, watch  volume.  Non-ST elevation myocardial infarction (NSTEMI), initial care episode - Type 2 secondary to respiratory issues - Troponin elevated but no sig CAD at cath, follow.  SignedTheodore Demark , PA-C 8:49 AM 06/28/2012 Patient independently assessed and examined. It appears she was hypovolemic on admission and may be septic. I discussed with Dr Molli Knock of CCM.Marland Kitchen We'll discontinue IV fluids, treat with antibiotics, and evaluate for improved renal function. She is starting to have more urine output without a diuretic. Renal ultrasound is consistent with chronic medical disease. We'll try to extubate her today as well.

## 2012-06-29 ENCOUNTER — Inpatient Hospital Stay (HOSPITAL_COMMUNITY): Payer: Medicare Other

## 2012-06-29 LAB — CULTURE, BLOOD (ROUTINE X 2)

## 2012-06-29 LAB — MAGNESIUM: Magnesium: 2.2 mg/dL (ref 1.5–2.5)

## 2012-06-29 LAB — BLOOD GAS, ARTERIAL
Bicarbonate: 25.1 mEq/L — ABNORMAL HIGH (ref 20.0–24.0)
O2 Saturation: 93.3 %
PEEP: 5 cmH2O
Patient temperature: 98.6
TCO2: 26.4 mmol/L (ref 0–100)

## 2012-06-29 LAB — BASIC METABOLIC PANEL
CO2: 24 mEq/L (ref 19–32)
Chloride: 106 mEq/L (ref 96–112)
GFR calc non Af Amer: >90 mL/min (ref >90)
Glucose, Bld: 252 mg/dL — ABNORMAL HIGH (ref 70–99)
Potassium: 3.5 mEq/L (ref 3.5–5.1)
Sodium: 137 mEq/L (ref 135–145)

## 2012-06-29 LAB — GLUCOSE, CAPILLARY
Glucose-Capillary: 144 mg/dL — ABNORMAL HIGH (ref 70–99)
Glucose-Capillary: 174 mg/dL — ABNORMAL HIGH (ref 70–99)
Glucose-Capillary: 132 mg/dL — ABNORMAL HIGH (ref 70–99)
Glucose-Capillary: 144 mg/dL — ABNORMAL HIGH (ref 70–99)

## 2012-06-29 LAB — CBC
Hemoglobin: 9.7 g/dL — ABNORMAL LOW (ref 12.0–15.0)
MCH: 30.1 pg (ref 26.0–34.0)
RBC: 3.22 MIL/uL — ABNORMAL LOW (ref 3.87–5.11)

## 2012-06-29 MED ORDER — SODIUM CHLORIDE 0.9 % IV SOLN
INTRAVENOUS | Status: DC
  Administered 2012-06-29: 17:00:00 via INTRAVENOUS

## 2012-06-29 MED ORDER — IPRATROPIUM BROMIDE 0.02 % IN SOLN
0.5000 mg | Freq: Four times a day (QID) | RESPIRATORY_TRACT | Status: DC
  Administered 2012-06-29 – 2012-07-04 (×20): 0.5 mg via RESPIRATORY_TRACT
  Filled 2012-06-29 (×20): qty 2.5

## 2012-06-29 MED ORDER — BIOTENE DRY MOUTH MT LIQD
15.0000 mL | Freq: Two times a day (BID) | OROMUCOSAL | Status: DC
  Administered 2012-06-29 – 2012-07-04 (×9): 15 mL via OROMUCOSAL

## 2012-06-29 MED ORDER — LOSARTAN POTASSIUM 50 MG PO TABS
50.0000 mg | ORAL_TABLET | Freq: Every day | ORAL | Status: DC
  Administered 2012-06-30: 50 mg via NASOGASTRIC
  Filled 2012-06-29 (×2): qty 1

## 2012-06-29 MED ORDER — LEVALBUTEROL HCL 0.63 MG/3ML IN NEBU
0.6300 mg | INHALATION_SOLUTION | Freq: Four times a day (QID) | RESPIRATORY_TRACT | Status: DC
  Administered 2012-06-29 – 2012-07-04 (×20): 0.63 mg via RESPIRATORY_TRACT
  Filled 2012-06-29 (×25): qty 3

## 2012-06-29 MED ORDER — LABETALOL HCL 5 MG/ML IV SOLN
0.5000 mg/min | INTRAVENOUS | Status: DC
  Administered 2012-06-29: 0.5 mg/min via INTRAVENOUS
  Filled 2012-06-29 (×2): qty 100

## 2012-06-29 NOTE — Progress Notes (Signed)
PROGRESS NOTE  Subjective:   [name] is a 71 yo with hx of COPD, DM, CVA who was transferred from [facility] with respiratory failure.  She was thought to have had a STEMI.  Cath last night revealed only mild - moderate CAD but did reveal a severely depressed LV EF of 20%.  She remains on the vent.  Objective:    Vital Signs:   Temp:  [97.4 F (36.3 C)-99 F (37.2 C)] 98.3 F (36.8 C) (12/18 0400) Pulse Rate:  [60-91] 81  (12/18 0600) Resp:  [9-21] 16  (12/18 0600) BP: (95-135)/(44-73) 131/66 mmHg (12/18 0600) SpO2:  [95 %-100 %] 99 % (12/18 0600) FiO2 (%):  [30 %-40 %] 40 % (12/18 0539) Weight:  [142 lb 3.2 oz (64.5 kg)] 142 lb 3.2 oz (64.5 kg) (12/18 0500)      24-hour weight change: Weight change: 5 lb 8.2 oz (2.5 kg)  Weight trends: Filed Weights   06/27/12 0240 06/28/12 0500 06/29/12 0500  Weight: 121 lb 0.5 oz (54.9 kg) 136 lb 11 oz (62 kg) 142 lb 3.2 oz (64.5 kg)    Intake/Output:  12/17 0701 - 12/18 0700 In: 1636 [I.V.:411; NG/GT:575; IV Piggyback:650] Out: 1252 [Urine:1250; Stool:2]     Physical Exam: BP 131/66  Pulse 81  Temp 98.3 F (36.8 C) (Oral)  Resp 16  Ht 5\' 4"  (1.626 m)  Wt 142 lb 3.2 oz (64.5 kg)  BMI 24.41 kg/m2  SpO2 99%  General: Vital signs reviewed and noted.   Head: Normocephalic, atraumatic.  Eyes: conjunctivae/corneas clear.  EOM's intact.   Throat: normal  Neck: 2+ carotids, no JVD  Lungs:    clear, on vent  Heart:  RR,   Abdomen:  Soft, non-tender, non-distended    Extremities: No edema   Neurologic: Sedated, on vent, did not respond  Psych:     Labs: BMET:  Basename 06/29/12 0430 06/28/12 0430  NA 137 134*  K 3.5 4.0  CL 106 105  CO2 24 22  GLUCOSE 252* 271*  BUN 12 8  CREATININE 0.41* 0.44*  CALCIUM 8.2* 8.2*  MG 2.2 1.7  PHOS 2.5 2.5    Liver function tests:  Basename 06/27/12 2315 06/26/12 2320  AST 15 13  ALT 11 15  ALKPHOS 77 116  BILITOT 0.1* 0.2*  PROT 5.2* 8.0  ALBUMIN 2.3* 3.8   No results  found for this basename: LIPASE:2,AMYLASE:2 in the last 72 hours  CBC:  Basename 06/29/12 0430 06/28/12 0430 06/26/12 2320  WBC 13.4* 13.0* --  NEUTROABS -- -- 10.3*  HGB 9.7* 10.2* --  HCT 29.6* 31.1* --  MCV 91.9 92.3 --  PLT 260 272 --    Cardiac Enzymes:  Basename 06/27/12 1528 06/27/12 0934 06/27/12 0251 06/26/12 2320  CKTOTAL -- -- -- --  CKMB -- -- -- --  TROPONINI 1.64* 1.74* 1.63* 0.39*    Coagulation Studies:  Basename 06/26/12 2320  LABPROT 12.8  INR 0.97      Tele:  NSR  Medications:    Infusions:    . sodium chloride 10 mL/hr at 06/27/12 0313  . fentaNYL infusion INTRAVENOUS 50 mcg/hr (06/28/12 2005)  . midazolam (VERSED) infusion 2 mg/hr (06/28/12 2000)    Scheduled Medications:    . antiseptic oral rinse  15 mL Mouth Rinse QID  . azithromycin  500 mg Intravenous Q24H  . cefTRIAXone (ROCEPHIN)  IV  1 g Intravenous Q24H  . chlorhexidine  15 mL Mouth Rinse BID  .  dipyridamole-aspirin  1 capsule Oral BID  . enoxaparin (LOVENOX) injection  40 mg Subcutaneous Q24H  . feeding supplement (GLUCERNA 1.2 CAL)  1,000 mL Per Tube Q24H  . feeding supplement  30 mL Per Tube QID  . insulin aspart  0-15 Units Subcutaneous Q4H  . ipratropium  6 puff Inhalation Q6H  . levalbuterol  6 puff Inhalation Q6H  . methylPREDNISolone (SOLU-MEDROL) injection  60 mg Intravenous Q12H  . multivitamin  5 mL Per Tube Daily  . pantoprazole sodium  40 mg Per Tube Daily  . phosphorus  250 mg Oral BID  . sodium chloride  3 mL Intravenous Q12H  . vancomycin  750 mg Intravenous Q12H    Assessment/ Plan:    1. Acute on chronic systolic CHF:  She presented with respiratory failure- likely to do a combination of systolic CHF and COPD.  The ECG suggested STEMI but the cath did not reveal any acute MI.  I suspect the Troponin elevation is due to her CHF and decompensation.  Will continue current supportive care.  Will start ARB.   She may not tolerate a beta blocker due to her  COPD.  The original diagnosis was STEMI.  There is no STEMI and I have changed the principle diagnosis to A/C systolic CHF.  She will not follow the STEMI pathway or have typical STEMI medications.  2. COPD :  Still on vent. Plans per PCCM  3. DM:  Will check accuchecks.  Holding metformin for now.  4. Acute on chronic Respiratory failure- management per CCM.     Disposition: on vent, management per PCCM. Length of Stay: 3  Vesta Mixer, Montez Hageman., MD, [facility] 06/29/2012, 7:05 AM Office [phone] Pager [phone]

## 2012-06-29 NOTE — Progress Notes (Signed)
06/29/2012- 0850- Pt suctioned and extubated to 4lpm cannula.  Pt tolerated well with sats of 95%, rr22, and pt vocalizing post extubation.  Will cont to monitor progress and wean as tolerated.

## 2012-06-29 NOTE — Progress Notes (Signed)
Name: Christine Durham MRN: [id number] DOB: July 24, 1940    LOS: 3  Referring Provider:  Cardiology Reason for Referral:  VDRF  PULMONARY / CRITICAL CARE MEDICINE  HPI:  71 years old female with PMH relevant for asthma, smoking, DM and stroke. Patient unable to provide history due to intubation and mechanical ventilation. From the ED records she presented with progressive SOB that started at about 5 pm. EKG done in the ED showed STEMI in the anterolateral leads with initial troponin of 0.39. Apparently the patient had significant wheezing and was hypoxemic. Cardiology decided to proceed with intubation before proceeding with the cardiac catheterization. Critical care consult was called to assist with management of COPD Asthma and mechanical ventilation. At the time of my exam the patient is intubated, sedated. Hemodynamically stable. Cardiac cath did not show obstructive CAD. LVEF is 20%  Vital Signs: Temp:  [97.4 F (36.3 C)-99 F (37.2 C)] 98.5 F (36.9 C) (12/18 0800) Pulse Rate:  [60-93] 93  (12/18 0844) Resp:  [9-21] 15  (12/18 0844) BP: (95-142)/(44-71) 142/71 mmHg (12/18 0700) SpO2:  [95 %-100 %] 98 % (12/18 0844) FiO2 (%):  [30 %-40 %] 40 % (12/18 0844) Weight:  [64.5 kg (142 lb 3.2 oz)] 64.5 kg (142 lb 3.2 oz) (12/18 0500)  Physical Examination: General:  Chronically ill appearing female, NAD. Neuro:  Arousable and follows commands. HEENT:  [place]/AT, PERRL, EOM-I and MMM. Neck:  Supple, -LAN and -thyromegally. Cardiovascular:  RRR, Nl S1/S2, -M/R/G. Lungs:  Coarse BS diffusely. Abdomen:  Soft, NT, ND and +BS. Musculoskeletal:  -edema and -tenderness. Skin:  Intact.  Principal Problem:  *Acute on chronic systolic CHF (congestive heart failure) Active Problems:  Acute respiratory failure  Diabetes mellitus without complication  NICM (nonischemic cardiomyopathy)  Hypotension  Non-ST elevation myocardial infarction (NSTEMI), initial care episode - Type 2 secondary to respiratory  issues   ASSESSMENT AND PLAN  PULMONARY  Lab 06/29/12 0500 06/28/12 0500 06/27/12 0400  PHART 7.394 7.280* 7.240*  PCO2ART 42.0 45.9* 53.7*  PO2ART 64.0* 149.0* 189.0*  HCO3 25.1* 20.9 22.2  O2SAT 93.3 99.0 99.1   Ventilator Settings: Vent Mode:  [-] CPAP;PSV FiO2 (%):  [30 %-40 %] 40 % Set Rate:  [16 bmp] 16 bmp Vt Set:  [420 mL] 420 mL PEEP:  [5 cmH20] 5 cmH20 Pressure Support:  [5 cmH20-10 cmH20] 5 cmH20 Plateau Pressure:  [12 cmH20-18 cmH20] 18 cmH20 CXR:  ET tube ok, pulmonary edema. ETT:  12/15>>>  A:  VDRF secondary to pulmonary edema and laying flat as well as underlying pulmonary disease. P:   - SBT to extubate today. - Continue to hold diureses today but KVO IVF. - BD as ordered. - IS and flutter valve.  CARDIOVASCULAR  Lab 06/27/12 1528 06/27/12 0934 06/27/12 0251 06/26/12 2321 06/26/12 2320  TROPONINI 1.64* 1.74* 1.63* -- 0.39*  LATICACIDVEN -- -- -- 2.4* --  PROBNP -- -- -- -- 150.2*   ECG:  Per cards. Lines: PIV  A: NSTEMI, nonobstructive lesions seen on cath.  Troponin dropping even with deteriorating renal function. P:  - Per cards. - Monitor troponins. - Start labetalol drip for BP control since patient will not be able to take PO after extubation.  RENAL  Lab 06/29/12 0430 06/28/12 0430 06/27/12 2315 06/27/12 0250 06/26/12 2320  NA 137 134* 135 -- 136  K 3.5 4.0 -- -- --  CL 106 105 106 -- 97  CO2 24 22 23  -- 28  BUN 12 8 8  -- 7  CREATININE 0.41* 0.44* 0.43* 0.60 0.50  CALCIUM 8.2* 8.2* 7.8* -- 9.6  MG 2.2 1.7 -- -- --  PHOS 2.5 2.5 -- -- --   Intake/Output      12/17 0701 - 12/18 0700 12/18 0701 - 12/19 0700   I.V. (mL/kg) 418 (6.5) 4 (0.1)   NG/GT 600 25   IV Piggyback 650    Total Intake(mL/kg) 1668 (25.9) 29 (0.4)   Urine (mL/kg/hr) 1500 (1) 100   Stool 2    Total Output 1502 100   Net +166 -71         Intake/Output Summary (Last 24 hours) at 06/29/12 1002 Last data filed at 06/29/12 0800  Gross per 24 hour  Intake    1201 ml  Output   1452 ml  Net   -251 ml   Foley:  12/15>>>  A:  Patient completely aneuric, cr ok and electrolytes WNL. P:   - Renal U/S negative for hydro, aneuria improved with hydration, CVP noted. - KVO IVF. - Hold lasix and allow for renal function to normalize. - Replace K and Phos.  GASTROINTESTINAL  Lab 06/27/12 2315 06/26/12 2320  AST 15 13  ALT 11 15  ALKPHOS 77 116  BILITOT 0.1* 0.2*  PROT 5.2* 8.0  ALBUMIN 2.3* 3.8   A:  No acute P:   - Hold TF and swallow evaluation ordered for today.  HEMATOLOGIC  Lab 06/29/12 0430 06/28/12 0430 06/27/12 2315 06/27/12 0250 06/26/12 2320  HGB 9.7* 10.2* 9.8* 11.7* 13.2  HCT 29.6* 31.1* 30.0* 36.0 39.8  PLT 260 272 251 295 337  INR -- -- -- -- 0.97  APTT -- -- -- -- 30   A:  No active issues. P:  - Daily CBC.  INFECTIOUS  Lab 06/29/12 0430 06/28/12 0430 06/27/12 2315 06/27/12 0250 06/26/12 2320  WBC 13.4* 13.0* 12.0* 15.6* 14.6*  PROCALCITON -- -- -- -- --   Cultures: Blood 12/16>>>Coag negative staph 2/2. Urine 12/16>>>NTD. Sputum 12/16>>>NTD. Antibiotics: Rocephin 12/16>>> Zithomax 12/16>>> Vancomycin 12/17>>>2/18  A:  ?CAP P:   - Pan culture with vancomycin added but is coag negative staph, will stop. - Rocephin and zithromax.  ENDOCRINE  Lab 06/29/12 0758 06/29/12 0351 06/28/12 2327 06/28/12 1944 06/28/12 1531  GLUCAP 174* 144* 143* 171* 122*   A:  DM history.   P:   CBG and SSI.  NEUROLOGIC  A:  Sedation protocol. P:   D/C sedation.  CC time 35 min.  Koren Bound, M.D. Pulmonary and Critical Care Medicine [facility] Pager: [phone]  06/29/2012, 10:02 AM

## 2012-06-30 ENCOUNTER — Inpatient Hospital Stay (HOSPITAL_COMMUNITY): Payer: Medicare Other

## 2012-06-30 LAB — CBC
MCH: 30 pg (ref 26.0–34.0)
MCV: 90.3 fL (ref 78.0–100.0)
Platelets: 272 10*3/uL (ref 150–400)
RDW: 13.9 % (ref 11.5–15.5)

## 2012-06-30 LAB — BASIC METABOLIC PANEL
BUN: 12 mg/dL (ref 6–23)
CO2: 29 mEq/L (ref 19–32)
Calcium: 9.1 mg/dL (ref 8.4–10.5)
Creatinine, Ser: 0.52 mg/dL (ref 0.50–1.10)
Glucose, Bld: 125 mg/dL — ABNORMAL HIGH (ref 70–99)

## 2012-06-30 MED ORDER — INSULIN ASPART 100 UNIT/ML ~~LOC~~ SOLN: 0.0000 [IU] | SUBCUTANEOUS | Status: DC

## 2012-06-30 MED ORDER — POTASSIUM CHLORIDE CRYS ER 20 MEQ PO TBCR: 40.0000 meq | EXTENDED_RELEASE_TABLET | Freq: Once | ORAL | Status: DC

## 2012-06-30 MED ORDER — INSULIN ASPART 100 UNIT/ML ~~LOC~~ SOLN
0.0000 [IU] | Freq: Three times a day (TID) | SUBCUTANEOUS | Status: DC
  Administered 2012-07-01: 3 [IU] via SUBCUTANEOUS
  Administered 2012-07-01: 5 [IU] via SUBCUTANEOUS
  Administered 2012-07-02 – 2012-07-03 (×4): 3 [IU] via SUBCUTANEOUS
  Administered 2012-07-03: 5 [IU] via SUBCUTANEOUS
  Administered 2012-07-04: 3 [IU] via SUBCUTANEOUS

## 2012-06-30 MED ORDER — CARVEDILOL 3.125 MG PO TABS
3.1250 mg | ORAL_TABLET | Freq: Two times a day (BID) | ORAL | Status: DC
  Administered 2012-06-30 – 2012-07-01 (×3): 3.125 mg via ORAL
  Filled 2012-06-30 (×5): qty 1

## 2012-06-30 MED ORDER — INSULIN ASPART 100 UNIT/ML ~~LOC~~ SOLN: 0.0000 [IU] | Freq: Every day | SUBCUTANEOUS | Status: DC

## 2012-06-30 MED ORDER — PANTOPRAZOLE SODIUM 40 MG PO TBEC
40.0000 mg | DELAYED_RELEASE_TABLET | Freq: Every day | ORAL | Status: DC
  Administered 2012-07-01 – 2012-07-04 (×4): 40 mg via ORAL
  Filled 2012-06-30 (×4): qty 1

## 2012-06-30 MED ORDER — ADULT MULTIVITAMIN W/MINERALS CH
1.0000 | ORAL_TABLET | Freq: Every day | ORAL | Status: DC
  Administered 2012-07-01 – 2012-07-04 (×4): 1 via ORAL
  Filled 2012-06-30 (×4): qty 1

## 2012-06-30 MED ORDER — METHYLPREDNISOLONE SODIUM SUCC 40 MG IJ SOLR
40.0000 mg | Freq: Two times a day (BID) | INTRAMUSCULAR | Status: DC
  Administered 2012-06-30 – 2012-07-04 (×8): 40 mg via INTRAVENOUS
  Filled 2012-06-30 (×9): qty 1

## 2012-06-30 MED ORDER — POTASSIUM CHLORIDE CRYS ER 20 MEQ PO TBCR
40.0000 meq | EXTENDED_RELEASE_TABLET | Freq: Once | ORAL | Status: AC
  Administered 2012-06-30: 40 meq via ORAL
  Filled 2012-06-30: qty 2

## 2012-06-30 MED ORDER — LOSARTAN POTASSIUM 50 MG PO TABS
50.0000 mg | ORAL_TABLET | Freq: Every day | ORAL | Status: DC
  Administered 2012-07-01 – 2012-07-04 (×4): 50 mg via ORAL
  Filled 2012-06-30 (×4): qty 1

## 2012-06-30 NOTE — Progress Notes (Signed)
NUTRITION FOLLOW UP  Intervention:   1. Will follow PO intake  Nutrition Dx:   Inadequate oral intake related to inability to eat as evidenced by NPO.  Resolving with diet advance   Goal:   Meet >/=90% estimated nutrition needs.  -unmet   Monitor:   PO intake, weight trends, labs, I/O's  Assessment:   Pt was extubated on 12/18 AM. Seen by SLP this morning for appropriate diet, recommending D3-thin.  S/p cardiac cath- did not show obstructive CAD, LVEF 20%    Height: Ht Readings from Last 1 Encounters:  06/27/12 5\' 4"  (1.626 m)    Weight Status:   Wt Readings from Last 1 Encounters:  06/30/12 139 lb 1.8 oz (63.1 kg)    Re-estimated needs:  Kcal: 1400-1600 Protein: 75-90 gm  Fluid: per team   Skin: intact   Diet Order: NPO   Intake/Output Summary (Last 24 hours) at 06/30/12 1038 Last data filed at 06/30/12 1000  Gross per 24 hour  Intake 1191.38 ml  Output    700 ml  Net 491.38 ml    Last BM: 12/18   Labs:   Lab 06/30/12 0350 06/29/12 0430 06/28/12 0430  NA 138 137 134*  K 3.6 3.5 4.0  CL 103 106 105  CO2 29 24 22   BUN 12 12 8   CREATININE 0.52 0.41* 0.44*  CALCIUM 9.1 8.2* 8.2*  MG 2.0 2.2 1.7  PHOS 2.9 2.5 2.5  GLUCOSE 125* 252* 271*    CBG (last 3)   Basename 06/30/12 0728 06/30/12 0328 06/29/12 2256  GLUCAP 168* 117* 144*    Scheduled Meds:   . antiseptic oral rinse  15 mL Mouth Rinse BID  . azithromycin  500 mg Intravenous Q24H  . carvedilol  3.125 mg Oral BID WC  . cefTRIAXone (ROCEPHIN)  IV  1 g Intravenous Q24H  . dipyridamole-aspirin  1 capsule Oral BID  . enoxaparin (LOVENOX) injection  40 mg Subcutaneous Q24H  . insulin aspart  0-15 Units Subcutaneous Q4H  . ipratropium  0.5 mg Nebulization Q6H  . levalbuterol  0.63 mg Nebulization Q6H  . losartan  50 mg Per NG tube Daily  . methylPREDNISolone (SOLU-MEDROL) injection  40 mg Intravenous Q12H  . multivitamin  5 mL Per Tube Daily  . pantoprazole sodium  40 mg Per Tube Daily   . potassium chloride  40 mEq Oral Once  . sodium chloride  3 mL Intravenous Q12H    Continuous Infusions:   . sodium chloride 10 mL/hr at 06/30/12 1000  . sodium chloride 10 mL/hr at 06/30/12 1000    Clarene Duke RD, LDN Pager [phone] After Hours pager [phone]

## 2012-06-30 NOTE — Evaluation (Addendum)
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  Admitted for progressive SOB and thought to have STEMI underwent cath, intubated early am of 12/16 and extubated 12/18.  Presents to below impairments affecting her prior level of function, independence and safety with functional mobility. Needs f/u rehab prior to d/c home.  Will benefit physical therapy in the acute setting to maximize safety for d/c to next venue.     PT Assessment  Patient needs continued PT services    Follow Up Recommendations  CIR    Does the patient have the potential to tolerate intense rehabilitation      Barriers to Discharge Decreased caregiver support Unsure how much support her sister is able to provide    Equipment Recommendations  None recommended by PT    Recommendations for Other Services OT consult (spoke with RN about OT consult)   Frequency Min 3X/week    Precautions / Restrictions Precautions Precautions: Fall Precaution Comments: CVA with left sided weakness in 2011, wears AFO on left ankle Restrictions Weight Bearing Restrictions: No         Mobility  Bed Mobility Bed Mobility: Supine to Sit Supine to Sit: 4: Min guard;HOB elevated;With rails (20 degrees) Details for Bed Mobility Assistance: sequencing cues, gaurding for safety, increased effort from weakness and residual left sided deficits Transfers Transfers: Sit to Stand;Stand to Sit Sit to Stand: 3: Mod assist;From bed;With upper extremity assist;With armrests Stand to Sit: 4: Min assist;To chair/3-in-1;With upper extremity assist;With armrests Details for Transfer Assistance: difficulty placing and controlling LUE for functional assist with sit->stand  needing therapist to stabilize it, mod facilitation at hips/trunk for anterior translation over BOS as well as stability as she powered up to standing, needing redirective cues for safety throughout all transfers and gait as pt distracted easily by surroundings Ambulation/Gait Ambulation/Gait Assistance: 4: Min assist Ambulation Distance (Feet): 25 Feet Assistive device: Rolling walker Ambulation/Gait Assistance Details: cues for tall posture and facilitation for trunk stability Gait Pattern: Left genu recurvatum;Decreased step length - left;Decreased stance time - left General Gait Details: ambulates on the lateral boder of left foot, appears AFO inverts her ankle slightly (possibly loose?) Stairs: No Wheelchair Mobility Wheelchair Mobility: No              PT Diagnosis: Difficulty walking;Abnormality of gait;Generalized weakness  PT Problem List: Decreased strength;Decreased activity tolerance;Decreased balance;Decreased mobility;Decreased cognition;Decreased safety awareness;Cardiopulmonary status limiting activity;Decreased knowledge of precautions;Decreased coordination;Decreased knowledge of use of DME PT Treatment Interventions: DME instruction;Gait training;Functional mobility training;Therapeutic activities;Therapeutic exercise;Balance training;Neuromuscular re-education;Patient/family education;Cognitive remediation   PT Goals Acute Rehab PT Goals PT Goal Formulation: With patient Time For Goal Achievement: 07/07/12 Potential to Achieve Goals: Good Pt will go Supine/Side to Sit: with modified independence PT Goal: Supine/Side to Sit - Progress: Goal set today Pt will go Sit to Supine/Side: with modified independence PT Goal: Sit to Supine/Side - Progress: Goal set today Pt will go Sit to Stand: with modified independence PT Goal: Sit to Stand - Progress: Goal set today Pt will go Stand to Sit: with modified independence PT Goal: Stand to Sit - Progress: Goal set today Pt  will Transfer Bed to Chair/Chair to Bed: with modified independence PT Transfer Goal: Bed to Chair/Chair to Bed - Progress: Goal  set today Pt will Ambulate: 51 - 150 feet;with modified independence;with least restrictive assistive device PT Goal: Ambulate - Progress: Goal set today Pt will Perform Home Exercise Program: Independently PT Goal: Perform Home Exercise Program - Progress: Goal set today  Visit Information  Last PT Received On: 06/30/12 Assistance Needed: +1    Subjective Data  Subjective: You are doing everything wrong. (with regards to putting on her shoes and AFO)   Prior Functioning  Home Living Lives With: Family (sister) Available Help at Discharge: Family;Available 24 hours/day (unsure how much sister can help her) Type of Home: House Home Access: Stairs to enter [employer] of Steps: 1 Home Layout: One level Bathroom Shower/Tub: Tub/shower unit Home Adaptive Equipment: Tub transfer bench;Walker - rolling;Straight cane Prior Function Level of Independence: Independent with assistive device(s) (?? per pt report, unsure how true this is) Needs Assistance: Transfers;Bathing Transfer Assistance: says she scoots on the bench into the tub, initially said sister helped but she bounced around a lot so unsure what the truth really is Driving: No Vocation: On disability Communication Communication: No difficulties    Cognition  Overall Cognitive Status: Impaired (possibly at baseline) Area of Impairment: Safety/judgement;Attention Arousal/Alertness: Awake/alert Orientation Level: Appears intact for tasks assessed Behavior During Session: [facility] for tasks performed Current Attention Level: Selective;Alternating Attention - Other Comments: highly distractable needing redirection throughout session Safety/Judgement: Decreased safety judgement for tasks assessed;Impulsive    Extremity/Trunk Assessment Right Upper Extremity Assessment RUE ROM/Strength/Tone: WFL  for tasks assessed (discomfort from IV making use of wrist difficult) Left Upper Extremity Assessment LUE ROM/Strength/Tone: Deficits LUE ROM/Strength/Tone Deficits: previous CVA with residual deficits, grossly 3-4/5 LUE Sensation: WFL - Light Touch Right Lower Extremity Assessment RLE ROM/Strength/Tone: WFL for tasks assessed Left Lower Extremity Assessment LLE ROM/Strength/Tone: Deficits LLE ROM/Strength/Tone Deficits: residual deficits from CVA, grossly 3-4/5   Balance Balance Balance Assessed: Yes Static Sitting Balance Static Sitting - Balance Support: Bilateral upper extremity supported Static Sitting - Level of Assistance: 5: Stand by assistance;4: Min assist Static Sitting - Comment/# of Minutes: falls posteriorly when challenged, increased effort trying to put her shoe on  Static Standing Balance Static Standing - Balance Support: Bilateral upper extremity supported Static Standing - Level of Assistance: 4: Min assist;5: Stand by assistance Static Standing - Comment/# of Minutes: cues for safety (pt trying to take hands off RW and turn around to see what the nurse is doing (pt incontinent with stool needing assist for pericare)  End of Session PT - End of Session Equipment Utilized During Treatment: Gait belt Activity Tolerance: Patient tolerated treatment well Patient left: in chair;with call bell/phone within reach Nurse Communication: Mobility status  GP     [facility] HELEN 06/30/2012, 12:04 PM

## 2012-06-30 NOTE — Progress Notes (Signed)
150 mls and of fenatnyl and versed respectively wasted at the sink in the presence of Tara. Charge nurse for the night

## 2012-06-30 NOTE — Progress Notes (Signed)
PROGRESS NOTE  Subjective:   Christine Durham is a 71 yo with hx of COPD, DM, CVA who was transferred from [facility] with respiratory failure.  She was thought to have had a STEMI.  Cath last night revealed only mild - moderate CAD but did reveal a severely depressed LV EF of 20%.  She remains on the vent.  Objective:    Vital Signs:   Temp:  [97.5 F (36.4 C)-98.5 F (36.9 C)] 98.1 F (36.7 C) (12/19 0725) Pulse Rate:  [72-110] 78  (12/19 0725) Resp:  [13-27] 15  (12/19 0600) BP: (99-172)/(47-98) 127/72 mmHg (12/19 0700) SpO2:  [89 %-100 %] 97 % (12/19 0725) FiO2 (%):  [36 %-40 %] 36 % (12/18 1513) Weight:  [139 lb 1.8 oz (63.1 kg)] 139 lb 1.8 oz (63.1 kg) (12/19 0504)  Last BM Date: 06/29/12   24-hour weight change: Weight change: -3 lb 1.4 oz (-1.4 kg)  Weight trends: Filed Weights   06/28/12 0500 06/29/12 0500 06/30/12 0504  Weight: 136 lb 11 oz (62 kg) 142 lb 3.2 oz (64.5 kg) 139 lb 1.8 oz (63.1 kg)    Intake/Output:  12/18 0701 - 12/19 0700 In: 1038.4 [I.V.:695.4; NG/GT:43; IV Piggyback:300] Out: 900 [Urine:900]     Physical Exam: BP 127/72  Pulse 78  Temp 98.1 F (36.7 C) (Oral)  Resp 15  Ht 5\' 4"  (1.626 m)  Wt 139 lb 1.8 oz (63.1 kg)  BMI 23.88 kg/m2  SpO2 97%  General: Vital signs reviewed and noted. Awake, extubated.  Head: Normocephalic, atraumatic.  Eyes: conjunctivae/corneas clear.  EOM's intact.   Throat: normal  Neck: 2+ carotids, no JVD  Lungs:   clear  Heart:  RR,   Abdomen:  Soft, non-tender, non-distended    Extremities: No edema   Neurologic: Grossly intact  Psych: Waking up fairly well,  Answers basic questions well    Labs: BMET:  Basename 06/30/12 0350 06/29/12 0430  NA 138 137  K 3.6 3.5  CL 103 106  CO2 29 24  GLUCOSE 125* 252*  BUN 12 12  CREATININE 0.52 0.41*  CALCIUM 9.1 8.2*  MG 2.0 2.2  PHOS 2.9 2.5    Liver function tests:  Basename 06/27/12 2315  AST 15  ALT 11  ALKPHOS 77  BILITOT 0.1*  PROT 5.2*  ALBUMIN  2.3*   No results found for this basename: LIPASE:2,AMYLASE:2 in the last 72 hours  CBC:  Basename 06/30/12 0350 06/29/12 0430  WBC 11.3* 13.4*  NEUTROABS -- --  HGB 9.9* 9.7*  HCT 29.8* 29.6*  MCV 90.3 91.9  PLT 272 260    Cardiac Enzymes:  Basename 06/27/12 1528 06/27/12 0934  CKTOTAL -- --  CKMB -- --  TROPONINI 1.64* 1.74*    Coagulation Studies: No results found for this basename: LABPROT:5,INR:5 in the last 72 hours    Tele:  NSR  Medications:    Infusions:    . sodium chloride 10 mL/hr at 06/29/12 1648  . sodium chloride 10 mL/hr at 06/29/12 1648  . labetalol (NORMODYNE) infusion Stopped (06/30/12 0410)    Scheduled Medications:    . antiseptic oral rinse  15 mL Mouth Rinse BID  . azithromycin  500 mg Intravenous Q24H  . cefTRIAXone (ROCEPHIN)  IV  1 g Intravenous Q24H  . dipyridamole-aspirin  1 capsule Oral BID  . enoxaparin (LOVENOX) injection  40 mg Subcutaneous Q24H  . insulin aspart  0-15 Units Subcutaneous Q4H  . ipratropium  0.5 mg Nebulization Q6H  .  levalbuterol  0.63 mg Nebulization Q6H  . losartan  50 mg Per NG tube Daily  . methylPREDNISolone (SOLU-MEDROL) injection  60 mg Intravenous Q12H  . multivitamin  5 mL Per Tube Daily  . pantoprazole sodium  40 mg Per Tube Daily  . phosphorus  250 mg Oral BID  . sodium chloride  3 mL Intravenous Q12H    Assessment/ Plan:    1. Acute on chronic systolic CHF:  She presented with respiratory failure- likely to do a combination of systolic CHF and COPD.  The ECG suggested STEMI but the cath did not reveal any acute MI.   She is now off the vent.  Making progress.  The original diagnosis was STEMI.  There is no STEMI and I have changed the principle diagnosis to A/C systolic CHF.  She will not follow the STEMI pathway or have typical STEMI medications. She tolerated the Labetalol last night without wheezing.  Will start coreg 3.125 BID.  2. COPD :  Still on vent. Plans per PCCM  3. DM:  Will  check accuchecks.  Holding metformin for now.  4. Acute on chronic Respiratory failure- management per CCM.     Disposition: will get PT/OT consult.  Transfer to tele / step down. tomorrow  Length of Stay: 4  Vesta Mixer, Montez Hageman., MD, [facility] 06/30/2012, 7:30 AM Office [phone] Pager [phone]

## 2012-06-30 NOTE — Progress Notes (Signed)
Rehab Admissions Coordinator Note:  Patient was screened by Trish Mage for appropriateness for an Inpatient Acute Rehab Consult.  At this time, we are recommending Inpatient Rehab consult.  Trish Mage 06/30/2012, 3:45 PM  I can be reached at [phone].

## 2012-06-30 NOTE — Evaluation (Signed)
Clinical/Bedside Swallow Evaluation Patient Details  Name: Christine Durham MRN: [id number] Date of Birth: 08/16/1940  Today's Date: 06/30/2012 Time: 4098-1191 SLP Time Calculation (min): 19 min  Past Medical History:  Past Medical History  Diagnosis Date  . Stroke   . Diabetes mellitus without complication   . Asthma    Past Surgical History:  Past Surgical History  Procedure Date  . Appendectomy    HPI:  71 yr old admitted with SOB, wheeze with PMH of COPD, CVA (2011).  STEMI underwent cath, intubated early am of 12/16 and extubated 12/18.  Pt. denies dysphagia from prior CVA.   Assessment / Plan / Recommendation Clinical Impression  Pt. exhibited an overall functional oropharyngeal swalllow fucntion.  Delayed cough x 1 at end of assessment possibly due to baseline cough with COPD.  Belching present throughout in pt. with history of GERD. Pt. is at mild aspiration risk at present which should diminish as pt.'s endurance and pharyngeal edema (if present ) from intubation subsides.  Recommend Dys 3 diet (for energy conservation) and thin liquids, small sips with straw ok (cups preferred for 1st day or so), pills whole in applesauce.  ST will f/u x 1.     Aspiration Risk  Mild    Diet Recommendation Dysphagia 3 (Mechanical Soft);Thin liquid   Liquid Administration via: Cup;Straw Medication Administration: Whole meds with puree Supervision: Patient able to self feed;Intermittent supervision to cue for compensatory strategies Compensations: Slow rate;Small sips/bites Postural Changes and/or Swallow Maneuvers: Seated upright 90 degrees;Upright 30-60 min after meal    Other  Recommendations Oral Care Recommendations: Oral care BID   Follow Up Recommendations  None    Frequency and Duration min 1 x/week  1 week        SLP Swallow Goals Patient will consume recommended diet without observed clinical signs of aspiration with: Minimal assistance Patient will utilize recommended  strategies during swallow to increase swallowing safety with: Minimal assistance   Swallow Study Prior Functional Status          Oral/Motor/Sensory Function Overall Oral Motor/Sensory Function: Appears within functional limits for tasks assessed   Ice Chips Ice chips: Not tested   Thin Liquid Thin Liquid: Impaired Presentation: Straw;Cup Pharyngeal  Phase Impairments: Cough - Delayed    Nectar Thick Nectar Thick Liquid: Not tested   Honey Thick Honey Thick Liquid: Within functional limits   Puree Puree: Within functional limits   Solid   GO    Solid: Within functional limits       Royce Macadamia M.Ed [employer] [phone]  06/30/2012

## 2012-06-30 NOTE — Progress Notes (Signed)
Name: Christine Durham MRN: [id number] DOB: 1940/08/29    LOS: 4  Referring Provider:  Cardiology Reason for Referral:  VDRF  PULMONARY / CRITICAL CARE MEDICINE  HPI:  71 years old female with PMH relevant for asthma, smoking, DM and stroke. Patient unable to provide history due to intubation and mechanical ventilation. From the ED records she presented with progressive SOB that started at about 5 pm. EKG done in the ED showed STEMI in the anterolateral leads with initial troponin of 0.39. Apparently the patient had significant wheezing and was hypoxemic. Cardiology decided to proceed with intubation before proceeding with the cardiac catheterization. Critical care consult was called to assist with management of COPD Asthma and mechanical ventilation. At the time of my exam the patient is intubated, sedated. Hemodynamically stable. Cardiac cath did not show obstructive CAD. LVEF is 20%  Vital Signs: Temp:  [97.5 F (36.4 C)-98.5 F (36.9 C)] 98.1 F (36.7 C) (12/19 0725) Pulse Rate:  [72-103] 78  (12/19 0725) Resp:  [14-26] 16  (12/19 0700) BP: (99-172)/(47-98) 127/72 mmHg (12/19 0700) SpO2:  [89 %-99 %] 97 % (12/19 0725) FiO2 (%):  [36 %] 36 % (12/18 1513) Weight:  [63.1 kg (139 lb 1.8 oz)] 63.1 kg (139 lb 1.8 oz) (12/19 0504)  Physical Examination: General:  Chronically ill appearing female, NAD. Neuro:  Arousable and follows commands.  Lethargic. HEENT:  [place]/AT, PERRL, EOM-I and MMM. Neck:  Supple, -LAN and -thyromegally. Cardiovascular:  RRR, Nl S1/S2, -M/R/G. Lungs: Bibasilar rales, ET tube out. Abdomen:  Soft, NT, ND and +BS. Musculoskeletal:  -edema and -tenderness. Skin:  Intact.  Principal Problem:  *Acute on chronic systolic CHF (congestive heart failure) Active Problems:  Acute respiratory failure  Diabetes mellitus without complication  NICM (nonischemic cardiomyopathy)  Hypotension  Non-ST elevation myocardial infarction (NSTEMI), initial care episode - Type 2  secondary to respiratory issues   ASSESSMENT AND PLAN  PULMONARY  Lab 06/29/12 0500 06/28/12 0500 06/27/12 0400  PHART 7.394 7.280* 7.240*  PCO2ART 42.0 45.9* 53.7*  PO2ART 64.0* 149.0* 189.0*  HCO3 25.1* 20.9 22.2  O2SAT 93.3 99.0 99.1   Ventilator Settings: Vent Mode:  [-]  FiO2 (%):  [36 %] 36 % CXR:  ET tube ok, pulmonary edema. ETT:  12/15>>>12/18  A:  VDRF secondary to pulmonary edema and laying flat as well as underlying pulmonary disease. P:   - Extubated and doing well. - Abx as below. - Hold further diureses from a pulmonary standpoint. - IS and flutter valve. - Decrease solumedrol to 40 q12 x4 doses then change to prednisone 40 mg PO daily x4 days then D/C. - Bronchodilators as ordered  CARDIOVASCULAR  Lab 06/27/12 1528 06/27/12 0934 06/27/12 0251 06/26/12 2321 06/26/12 2320  TROPONINI 1.64* 1.74* 1.63* -- 0.39*  LATICACIDVEN -- -- -- 2.4* --  PROBNP -- -- -- -- 150.2*   ECG:  Per cards. Lines: L IJ TLC 12/15>>>12/19  A: NSTEMI, nonobstructive lesions seen on cath.  Troponin dropping even with deteriorating renal function. P:  - Per cards. - Monitor troponins. - HTN management per cards.  RENAL  Lab 06/30/12 0350 06/29/12 0430 06/28/12 0430 06/27/12 2315 06/27/12 0250 06/26/12 2320  NA 138 137 134* 135 -- 136  K 3.6 3.5 -- -- -- --  CL 103 106 105 106 -- 97  CO2 29 24 22 23  -- 28  BUN 12 12 8 8  -- 7  CREATININE 0.52 0.41* 0.44* 0.43* 0.60 --  CALCIUM 9.1 8.2* 8.2* 7.8* --  9.6  MG 2.0 2.2 1.7 -- -- --  PHOS 2.9 2.5 2.5 -- -- --   Intake/Output      12/18 0701 - 12/19 0700 12/19 0701 - 12/20 0700   I.V. (mL/kg) 695.4 (11)    NG/GT 43    IV Piggyback 300    Total Intake(mL/kg) 1038.4 (16.5)    Urine (mL/kg/hr) 900 (0.6)    Stool 0    Total Output 900    Net +138.4         Stool Occurrence 2 x      Intake/Output Summary (Last 24 hours) at 06/30/12 1030 Last data filed at 06/30/12 0700  Gross per 24 hour  Intake 931.38 ml  Output     700 ml  Net 231.38 ml   Foley:  12/15>>>  A:  Patient completely aneuric, cr ok and electrolytes WNL. P:   - Renal U/S negative for hydro, aneuria improved with hydration, CVP noted. - KVO IVF. - Hold lasix from a pulmonary standpoint. - Replace electrolytes as needed.  GASTROINTESTINAL  Lab 06/27/12 2315 06/26/12 2320  AST 15 13  ALT 11 15  ALKPHOS 77 116  BILITOT 0.1* 0.2*  PROT 5.2* 8.0  ALBUMIN 2.3* 3.8   A:  No acute P:   - Diet per speech recommendations.  HEMATOLOGIC  Lab 06/30/12 0350 06/29/12 0430 06/28/12 0430 06/27/12 2315 06/27/12 0250 06/26/12 2320  HGB 9.9* 9.7* 10.2* 9.8* 11.7* --  HCT 29.8* 29.6* 31.1* 30.0* 36.0 --  PLT 272 260 272 251 295 --  INR -- -- -- -- -- 0.97  APTT -- -- -- -- -- 30   A:  No active issues. P:  - Daily CBC.  INFECTIOUS  Lab 06/30/12 0350 06/29/12 0430 06/28/12 0430 06/27/12 2315 06/27/12 0250  WBC 11.3* 13.4* 13.0* 12.0* 15.6*  PROCALCITON -- -- -- -- --   Cultures: Blood 12/16>>>Coag negative staph 2/2, repeat pending. Urine 12/16>>>NTD. Sputum 12/16>>>NTD. Antibiotics: Rocephin 12/16>>>12/23 Zithomax 12/16>>>12/20 Vancomycin 12/17>>>2/18  A:  ?CAP P:   - Rocephin and zithromax as designated above.  ENDOCRINE  Lab 06/30/12 0728 06/30/12 0328 06/29/12 2256 06/29/12 2001 06/29/12 1553  GLUCAP 168* 117* 144* 169* 147*   A:  DM history.   P:   CBG and SSI.  NEUROLOGIC  A:  Sedation protocol. P:   D/C sedation.  Koren Bound, M.D. Pulmonary and Critical Care Medicine [facility] Pager: [phone]  06/30/2012, 10:30 AM

## 2012-07-01 ENCOUNTER — Encounter (HOSPITAL_COMMUNITY): Payer: Self-pay | Admitting: Physician Assistant

## 2012-07-01 DIAGNOSIS — E119 Type 2 diabetes mellitus without complications: Secondary | ICD-10-CM

## 2012-07-01 DIAGNOSIS — I219 Acute myocardial infarction, unspecified: Secondary | ICD-10-CM

## 2012-07-01 DIAGNOSIS — R5381 Other malaise: Secondary | ICD-10-CM

## 2012-07-01 DIAGNOSIS — Z720 Tobacco use: Secondary | ICD-10-CM | POA: Diagnosis present

## 2012-07-01 DIAGNOSIS — J9600 Acute respiratory failure, unspecified whether with hypoxia or hypercapnia: Secondary | ICD-10-CM

## 2012-07-01 LAB — BASIC METABOLIC PANEL
CO2: 28 mEq/L (ref 19–32)
GFR calc non Af Amer: >90 mL/min (ref >90)
Glucose, Bld: 245 mg/dL — ABNORMAL HIGH (ref 70–99)
Potassium: 4.1 mEq/L (ref 3.5–5.1)
Sodium: 139 mEq/L (ref 135–145)

## 2012-07-01 LAB — CBC
Hemoglobin: 10 g/dL — ABNORMAL LOW (ref 12.0–15.0)
MCH: 29.9 pg (ref 26.0–34.0)
RBC: 3.34 MIL/uL — ABNORMAL LOW (ref 3.87–5.11)

## 2012-07-01 LAB — GLUCOSE, CAPILLARY
Glucose-Capillary: 170 mg/dL — ABNORMAL HIGH (ref 70–99)
Glucose-Capillary: 194 mg/dL — ABNORMAL HIGH (ref 70–99)

## 2012-07-01 LAB — MAGNESIUM: Magnesium: 2 mg/dL (ref 1.5–2.5)

## 2012-07-01 LAB — PHOSPHORUS: Phosphorus: 3.2 mg/dL (ref 2.3–4.6)

## 2012-07-01 LAB — CULTURE, BLOOD (ROUTINE X 2)

## 2012-07-01 MED ORDER — METFORMIN HCL 500 MG PO TABS
500.0000 mg | ORAL_TABLET | Freq: Two times a day (BID) | ORAL | Status: DC
Start: 2012-07-02 — End: 2012-07-04
  Administered 2012-07-02 – 2012-07-04 (×5): 500 mg via ORAL
  Filled 2012-07-01 (×7): qty 1

## 2012-07-01 MED ORDER — AZITHROMYCIN 500 MG PO TABS
500.0000 mg | ORAL_TABLET | Freq: Every day | ORAL | Status: DC
  Administered 2012-07-02 – 2012-07-04 (×3): 500 mg via ORAL
  Filled 2012-07-01 (×3): qty 1

## 2012-07-01 MED ORDER — INFLUENZA VIRUS VACC SPLIT PF IM SUSP
0.5000 mL | INTRAMUSCULAR | Status: AC
  Administered 2012-07-02: 0.5 mL via INTRAMUSCULAR
  Filled 2012-07-01: qty 0.5

## 2012-07-01 MED ORDER — CARVEDILOL 6.25 MG PO TABS
6.2500 mg | ORAL_TABLET | Freq: Two times a day (BID) | ORAL | Status: DC
  Administered 2012-07-01 – 2012-07-04 (×6): 6.25 mg via ORAL
  Filled 2012-07-01 (×9): qty 1

## 2012-07-01 MED ORDER — PNEUMOCOCCAL VAC POLYVALENT 25 MCG/0.5ML IJ INJ
0.5000 mL | INJECTION | INTRAMUSCULAR | Status: AC
  Administered 2012-07-02: 0.5 mL via INTRAMUSCULAR
  Filled 2012-07-01: qty 0.5

## 2012-07-01 NOTE — Progress Notes (Signed)
Speech Language Pathology Dysphagia Treatment Patient Details Name: Christine Durham MRN: [id number] DOB: 08-28-1940 Today's Date: 07/01/2012 Time: 4098-1191 SLP Time Calculation (min): 8 min  Assessment / Plan / Recommendation Clinical Impression  Follow up after yesterday's bedside swallow assessment.  Pt. alert, up in chair coughing as SLP entered room.  Delayed cough x 1 following sip water, however suspect this is baseline cough versus aspiration with liquid.  No difficulty masticating cracker.  SLP explained clinical relationship between respiration and swallowing and benefit of rest breaks if needed, small sips, use caution with straw.  Pt. verbalized understanding.  Recommend continue Dys 3 diet (for energy conservation) and thin liquids.  No further ST needed.     Diet Recommendation  Continue with Current Diet: Dysphagia 3 (mechanical soft);Thin liquid    SLP Plan All goals met;Discharge SLP treatment due to (comment)      Swallowing Goals  SLP Swallowing Goals Patient will consume recommended diet without observed clinical signs of aspiration with: Minimal assistance Swallow Study Goal #1 - Progress: Met Patient will utilize recommended strategies during swallow to increase swallowing safety with: Minimal assistance Swallow Study Goal #2 - Progress: Met  General Temperature Spikes Noted: No Respiratory Status: Supplemental O2 delivered via (comment) Behavior/Cognition: Alert;Cooperative;Pleasant mood Oral Cavity - Dentition: Adequate natural dentition Patient Positioning: Upright in chair  Oral Cavity - Oral Hygiene Does patient have any of the following "at risk" factors?: Oxygen therapy - cannula, mask, simple oxygen devices Brush patient's teeth BID with toothbrush (using toothpaste with fluoride): Yes   Dysphagia Treatment Treatment focused on: Skilled observation of diet tolerance;Patient/family/caregiver education Treatment Methods/Modalities: Skilled  observation Patient observed directly with PO's: Yes Type of PO's observed: Regular;Thin liquids Feeding: Able to feed self Liquids provided via: Straw Pharyngeal Phase Signs & Symptoms: Delayed cough Type of cueing: Verbal Amount of cueing: Minimal        Royce Macadamia M.Ed [employer] [phone]  07/01/2012

## 2012-07-01 NOTE — Progress Notes (Signed)
Patient Name: Christine Durham Date of Encounter: 07/01/2012     Principal Problem:  *Acute on chronic systolic CHF (congestive heart failure) Active Problems:  Acute respiratory failure  Diabetes mellitus without complication  NICM (nonischemic cardiomyopathy)  Hypotension  Non-ST elevation myocardial infarction (NSTEMI), initial care episode - Type 2 secondary to respiratory issues   SUBJECTIVE: Speaking in short sentences. Still short of breath. No chest pain or lightheadedness. Reports progressive dyspnea leading up to hospitalization. No chest pain, leg swelling or weight changes. No increased stress.   OBJECTIVE  Filed Vitals:   07/01/12 0400 07/01/12 0500 07/01/12 0600 07/01/12 0700  BP: 109/73 126/67 128/61 142/71  Pulse: 96 93 82 93  Temp:      TempSrc:      Resp:      Height:      Weight:   62.7 kg (138 lb 3.7 oz)   SpO2: 95% 98% 98% 96%    Intake/Output Summary (Last 24 hours) at 07/01/12 0810 Last data filed at 07/01/12 0321  Gross per 24 hour  Intake    750 ml  Output   1000 ml  Net   -250 ml   Weight change: -0.4 kg (-14.1 oz)  PHYSICAL EXAM  General: Appears older than stated age, in no acute distress. Head: Normocephalic, sparse hair distribution, atraumatic, sclera non-icteric, no xanthomas, nares are without discharge, poor dentition  Neck: Supple without bruits or JVD. Lungs:  Tachypneic, mild-moderately labored, accessory muscles used when speaking, expiratory wheeze and rhonchi which clears with cough noted Heart: Distant heart sounds, RRR no s3, s4, or murmurs. Abdomen: Soft, non-tender, non-distended, BS + x 4.  Msk:  Strength and tone appears normal for age. Extremities: No clubbing, cyanosis or edema. DP/PT/Radials 2+ and equal bilaterally. Neuro: Alert and oriented X 3. Moves all extremities spontaneously. Psych: Normal affect.  LABS:  Recent Labs  [facility] 07/01/12 0535 06/30/12 0350   WBC 11.7* 11.3*   HGB 10.0* 9.9*   HCT 29.9*  29.8*   MCV 89.5 90.3   PLT 277 272   Lab 07/01/12 0535 06/30/12 0350 06/29/12 0430 06/27/12 2315  NA 139 138 137 --  K 4.1 3.6 3.5 --  CL 103 103 106 --  CO2 28 29 24  --  BUN 11 12 12  --  CREATININE 0.58 0.52 0.41* --  CALCIUM 9.0 9.1 8.2* --  PROT -- -- -- 5.2*  BILITOT -- -- -- 0.1*  ALKPHOS -- -- -- 77  ALT -- -- -- 11  AST -- -- -- 15  AMYLASE -- -- -- --  LIPASE -- -- -- --  GLUCOSE 245* 125* 252* --    TELE: NSR, sinus tachycardia intermittently with hypoxia  Radiology/Studies:  US Renal  06/27/2012  *RADIOLOGY REPORT*  Clinical Data: Acute renal failure.  Diabetes.  Rule out hydronephrosis.  RENAL/URINARY TRACT ULTRASOUND COMPLETE  Comparison:  None.  Findings:  Right Kidney:  The right kidney is isoechoic to the index organ, the liver.  It is somewhat small measuring 9.8 cm.  No focal lesions are present.  Left Kidney:  The left kidney is isoechoic to the index organ, the spleen.  There is no hydronephrosis.  No stones or mass lesions are present.  The maximal length is 10.0 cm.  Bladder:  A Foley catheter is in place.  IMPRESSION:  1.  Somewhat small hyperechoic kidneys bilaterally, compatible with chronic medical renal disease. 2.  No mass lesion or evidence for obstruction.   Original Report  Authenticated By: Marin Roberts, M.D.    Dg Chest Port 1 View  06/30/2012  *RADIOLOGY REPORT*  Clinical Data: Shortness of breath, post extubation  PORTABLE CHEST - 1 VIEW  Comparison: 06/29/2012  Findings: Endotracheal tube has been removed.  No diagnostic pneumothorax.  The cardiomediastinal silhouette is stable. Persistent right basilar atelectasis or infiltrate.  Left basilar atelectasis.  Stable left subclavian central line position with tip in the innominate vein.  No pulmonary edema.  IMPRESSION: Endotracheal tube has been removed.  No diagnostic pneumothorax. No pulmonary edema.  Right basilar atelectasis or infiltrate.  Left basilar atelectasis.  Stable left subclavian  central line position.   Original Report Authenticated By: Natasha Mead, M.D.    Dg Chest Port 1 View  06/29/2012  *RADIOLOGY REPORT*  Clinical Data: Shortness of breath.  Endotracheal tube position.  PORTABLE CHEST - 1 VIEW  Comparison: 06/28/2012  Findings: 0550 hours.  Endotracheal tube tip is 3.9 cm above the base of the carina. The NG tube passes into the stomach although the distal tip position is not included on the film.  The tip of the left subclavian central line has pulled back in the interval and is now positioned at the innominate vein confluence.  Cardiopericardial silhouette is stable.  There is bibasilar collapse / consolidation, left greater than right.  Probable tiny bilateral pleural effusions. Telemetry leads overlie the chest.  IMPRESSION: The left subclavian central line tip has pulled back in the interval and is now at the level of the innominate vein confluence.  Persistent bibasilar collapse / consolidation.   Original Report Authenticated By: Kennith Center, M.D.    Dg Chest Port 1 View  06/28/2012  *RADIOLOGY REPORT*  Clinical Data: Shortness of breath.  Assess endotracheal tube position.  PORTABLE CHEST - 1 VIEW  Comparison: 06/27/2012.  Findings: The support apparatus is stable.  Low lung volumes with vascular crowding and bibasilar atelectasis.  No large pleural effusion or pulmonary edema.  IMPRESSION:  1.  Stable support apparatus. 2.  Lower lung volumes with vascular crowding and bibasilar atelectasis.   Original Report Authenticated By: Rudie Meyer, M.D.    Dg Chest Port 1 View  06/27/2012  *RADIOLOGY REPORT*  Clinical Data: Central line placement.  PORTABLE CHEST - 1 VIEW  Comparison: Chest x-ray 06/27/2012.  Findings: An endotracheal tube is in place with tip 3.4 cm above the carina. There is a left-sided subclavian central venous catheter with tip terminating in the distal superior vena cava. A nasogastric tube is seen extending into the stomach, however, the tip of the  nasogastric tube extends below the lower margin of the image.  Lung volumes are slightly low.  Linear bibasilar opacities are favored to represent subsegmental atelectasis.  No definite pleural effusions.  Pulmonary vascular crowding related to low lung volumes, without frank pulmonary edema.  Heart size is normal. Mediastinal contours are unremarkable.  Atherosclerosis of the thoracic aorta.  IMPRESSION: 1.  Support apparatus, as above. 2.  Low lung volumes with probable bibasilar subsegmental atelectasis.   Original Report Authenticated By: Trudie Reed, M.D.    Dg Chest Port 1 View  06/27/2012  *RADIOLOGY REPORT*  Clinical Data: Check ET position  PORTABLE CHEST - 1 VIEW  Comparison: 06/26/2012  Findings: Endotracheal tube tip 3.7 cm proximal to the carina. Bibasilar opacities.  Interstitial coarsening.  Right hilar prominence.  No pneumothorax.  No definite pleural effusion. Cardiac contour within normal limits. No interval osseous change.  IMPRESSION: Endotracheal tube tip 3.7 cm proximal  to the carina.  Bibasilar opacities; atelectasis, aspiration, or infiltrate.  Right hilar prominence is nonspecific, may reflect vascular congestion.  Recommend attention at two-view follow-up when the patient is clinically able.   Original Report Authenticated By: Jearld Lesch, M.D.    Dg Chest Port 1 View  06/26/2012  *RADIOLOGY REPORT*  Clinical Data: Shortness of breath.  PORTABLE CHEST - 1 VIEW  Comparison: None.  Findings: Adequately inflated without focal consolidation or effusion.  There is mild prominence of the perihilar markings which may be due to minimal vascular congestion.  Cardiomediastinal silhouette is within normal.  There is minimal spondylosis of the spine.  IMPRESSION: Suggestion of minimal vascular congestion.   Original Report Authenticated By: Elberta Fortis, M.D.     Current Medications:     . antiseptic oral rinse  15 mL Mouth Rinse BID  . azithromycin  500 mg Intravenous Q24H  .  carvedilol  3.125 mg Oral BID WC  . cefTRIAXone (ROCEPHIN)  IV  1 g Intravenous Q24H  . dipyridamole-aspirin  1 capsule Oral BID  . enoxaparin (LOVENOX) injection  40 mg Subcutaneous Q24H  . influenza  inactive virus vaccine  0.5 mL Intramuscular Tomorrow-1000  . insulin aspart  0-15 Units Subcutaneous TID WC  . insulin aspart  0-5 Units Subcutaneous QHS  . ipratropium  0.5 mg Nebulization Q6H  . levalbuterol  0.63 mg Nebulization Q6H  . losartan  50 mg Oral Daily  . methylPREDNISolone (SOLU-MEDROL) injection  40 mg Intravenous Q12H  . multivitamin with minerals  1 tablet Oral Daily  . pantoprazole  40 mg Oral QAC breakfast  . pneumococcal 23 valent vaccine  0.5 mL Intramuscular Tomorrow-1000  . potassium chloride  40 mEq Oral Once  . sodium chloride  3 mL Intravenous Q12H    ASSESSMENT:  Ms. Chow is a 71 yo with hx of COPD, DM, CVA who was transferred from [facility] with respiratory failure. She was thought to have had a STEMI. Cath 12/16 reveals nonobstructive CAD, elevated LVEDP, LVEF 25%, WMAs c/w stress-induced cardiomyopathy.  1. VDRF s/p extubation 12/19 2. Acute systolic CHF/NICM 3. NSTEMI, type 2 4. Asthma 5. DM2 6. HTN 7. H/o CVA 8. Tobacco abuse 9. Anuria  - resolved  DISCUSSION/PLAN:   Patient successfully extubated yesterday. Acute on chronic respiratory failure multifactorial 2/2 acute systolic CHF believed to be mediated by stress-induced cardiomyopathy and underlying asthma +/- A/COPD with continued tobacco abuse. Still dyspneic on exam with expiratory wheezing and rhonchi. O2 sat's consistently in the mid-high 90s. Undergoing breathing treatment now. Continued on nebs, O2, abx and steroids. Appreciate management by pulmonary/CCM. From a cardiac perspective, she appears fairly euvolemic. No pulmonary edema on CXR yesterday. Adventitious lung sounds likely from primary pulmonary disease. Continue ARB and BB. Add low-dose ASA and statin. She will need repeat echo after  discharge. Okay to transfer to stepdown. Inpatient rehab recommended, and deemed appropriate once medically stable. Will plan on this once tuned up. Otherwise continue SSI. Tobacco cessation stressed. Anuria resolved. No acute process on renal u/s 12/16. BP and renal function stable.    Signed, R. Hurman Horn, PA-C 07/01/2012, 8:10 AM   Attending Note:   The patient was seen and examined.  Agree with assessment and plan as noted above and the note was amended as needed.  She has improved significantly this week.  We will transfer her out of the CCU.  She has significant COPD and underlying lung disease. I think that much of her presenting symptoms  were due to lung issues as well as her cardiomyopathy.  We have increased her Coreg today to 6.25 BID.  Will continue to titrate her CHF meds up.  2. CPAP Per PCCM-- continue antibiotics as noted below.  Antibiotics:  Rocephin 12/16>>>12/23  Zithomax 12/16>>>12/20  Vancomycin 12/17>>>2/18   Alvia Grove., MD, [facility] 07/01/2012, 4:46 PM

## 2012-07-01 NOTE — Progress Notes (Signed)
PHARMACIST - PHYSICIAN COMMUNICATION DR:   Kirke Corin CONCERNING: Antibiotic IV to Oral Route Change Policy  RECOMMENDATION: This patient is receiving Azithromycin by the intravenous route.  Based on criteria approved by the Pharmacy and Therapeutics Committee, the antibiotic(s) is/are being converted to the equivalent oral dose form(s).   DESCRIPTION: These criteria include:  Patient being treated for a respiratory tract infection, urinary tract infection, or cellulitis  The patient is not neutropenic and does not exhibit a GI malabsorption state  The patient is eating (either orally or via tube) and/or has been taking other orally administered medications for a least 24 hours  The patient is improving clinically and has a Tmax < 100.5  If you have questions about this conversion, please contact the Pharmacy Department  []   [phone] )  [facility] [x]   [phone] )  [facility]  []   [phone] )  [facility] []   [phone] )  [facility]   Georgina Pillion, PharmD, BCPS 07/01/2012 2:59 PM

## 2012-07-01 NOTE — Progress Notes (Signed)
Physical Therapy Treatment Patient Details Name: Christine Durham MRN: [id number] DOB: 30-Dec-1940 Today's Date: 07/01/2012 Time: 4098-1191 PT Time Calculation (min): 55 min  PT Assessment / Plan / Recommendation Comments on Treatment Session  Pt admitted with SOB, VDRF, STEMI pt initially dyspneic on arrival with wheezing with sats 96% on 2L RN and RT notified with breathing tx provided. Pt able to maintain 89-96% on 2L with mobility with HR 83-95 with mobility. Pt cued for breathing technique and mobility. Will continue to follow.     Follow Up Recommendations        Does the patient have the potential to tolerate intense rehabilitation     Barriers to Discharge        Equipment Recommendations       Recommendations for Other Services    Frequency     Plan Discharge plan remains appropriate;Frequency remains appropriate    Precautions / Restrictions Precautions Precautions: Fall Precaution Comments: Left weakness and AFO   Pertinent Vitals/Pain No pain    Mobility  Bed Mobility Bed Mobility: Not assessed Details for Bed Mobility Assistance: Pt in recliner before and after tx Transfers Sit to Stand: From chair/3-in-1;From toilet;3: Mod assist Stand to Sit: 4: Min assist;To chair/3-in-1;To toilet Details for Transfer Assistance: pt with cueing for sequence, safety and hand placement with assist for anterior translation from chair and toilet over 3 trials. Assist to control descent Ambulation/Gait Ambulation/Gait Assistance: 4: Min assist Ambulation Distance (Feet): 100 Feet (100', 10', 10') Ambulation/Gait Assistance Details: Pt ambulated in hall with RW with excessive trunk flexion and inversion LLE. Pt needed to use bathroom after sitting and assited to bathroom with assist for pericare due to incontinent stool in standing Gait Pattern: Step-through pattern;Decreased stride length;Decreased stance time - left;Trunk flexed Gait velocity: decreased Stairs: No    Exercises  General Exercises - Lower Extremity Long Arc Quad: AROM;Both;20 reps;Seated Hip Flexion/Marching: AROM;Both;20 reps;Seated   PT Diagnosis:    PT Problem List:   PT Treatment Interventions:     PT Goals Acute Rehab PT Goals PT Goal: Sit to Stand - Progress: Progressing toward goal PT Goal: Stand to Sit - Progress: Progressing toward goal PT Goal: Ambulate - Progress: Progressing toward goal PT Goal: Perform Home Exercise Program - Progress: Progressing toward goal  Visit Information  Last PT Received On: 07/01/12 Assistance Needed: +1    Subjective Data  Subjective: I'm just so SOB   Cognition  Arousal/Alertness: Awake/alert Orientation Level: Appears intact for tasks assessed Behavior During Session: [facility] for tasks performed Safety/Judgement: Decreased awareness of need for assistance    Balance     End of Session PT - End of Session Equipment Utilized During Treatment: Gait belt;Left ankle foot orthosis;Oxygen Activity Tolerance: Patient tolerated treatment well Patient left: in chair   GP     [facility] 07/01/2012, 9:57 AM Delaney Meigs, PT [phone]

## 2012-07-01 NOTE — Progress Notes (Signed)
Name: Christine Durham MRN: [id number] DOB: 1941/02/16    LOS: 5  Referring Provider:  Cardiology Reason for Referral:  VDRF  PULMONARY / CRITICAL CARE MEDICINE  HPI:  71 years old female with PMH relevant for asthma, smoking, DM and stroke. Patient unable to provide history due to intubation and mechanical ventilation. From the ED records she presented with progressive SOB that started at about 5 pm. EKG done in the ED showed STEMI in the anterolateral leads with initial troponin of 0.39. Apparently the patient had significant wheezing and was hypoxemic. Cardiology decided to proceed with intubation before proceeding with the cardiac catheterization. Critical care consult was called to assist with management of COPD Asthma and mechanical ventilation. At the time of my exam the patient is intubated, sedated. Hemodynamically stable. Cardiac cath did not show obstructive CAD. LVEF is 20%  Vital Signs: Temp:  [97.3 F (36.3 C)-98.4 F (36.9 C)] 98.4 F (36.9 C) (12/20 0800) Pulse Rate:  [76-108] 92  (12/20 0950) Resp:  [20] 20  (12/20 0147) BP: (107-142)/(50-84) 141/79 mmHg (12/20 0824) SpO2:  [88 %-98 %] 96 % (12/20 0950) Weight:  [62.7 kg (138 lb 3.7 oz)] 62.7 kg (138 lb 3.7 oz) (12/20 0600)  Physical Examination: General:  Chronically ill appearing female, NAD. Neuro:  Arousable and follows commands.  Lethargic. HEENT:  [place]/AT, PERRL, EOM-I and MMM. Neck:  Supple, -LAN and -thyromegally. Cardiovascular:  RRR, Nl S1/S2, -M/R/G. Lungs: Bibasilar rales, ET tube out. Abdomen:  Soft, NT, ND and +BS. Musculoskeletal:  -edema and -tenderness. Skin:  Intact.  Principal Problem:  *Acute on chronic systolic CHF (congestive heart failure) Active Problems:  Acute respiratory failure  Diabetes mellitus without complication  NICM (nonischemic cardiomyopathy)  Hypotension  Non-ST elevation myocardial infarction (NSTEMI), initial care episode - Type 2 secondary to respiratory issues  Tobacco  abuse   ASSESSMENT AND PLAN  PULMONARY  Lab 06/29/12 0500 06/28/12 0500 06/27/12 0400  PHART 7.394 7.280* 7.240*  PCO2ART 42.0 45.9* 53.7*  PO2ART 64.0* 149.0* 189.0*  HCO3 25.1* 20.9 22.2  O2SAT 93.3 99.0 99.1   Ventilator Settings:   CXR:  ET tube ok, pulmonary edema. ETT:  12/15>>>12/18  A:  VDRF secondary to pulmonary edema and laying flat as well as underlying pulmonary disease. P:   - Extubated and doing well. - Abx as below. - Diureses per cards. - IS and flutter valve. - Decreased solumedrol to 40 q12 x2 doses then change to prednisone 40 mg PO daily x4 days then D/C. - Bronchodilators as ordered  CARDIOVASCULAR  Lab 06/27/12 1528 06/27/12 0934 06/27/12 0251 06/26/12 2321 06/26/12 2320  TROPONINI 1.64* 1.74* 1.63* -- 0.39*  LATICACIDVEN -- -- -- 2.4* --  PROBNP -- -- -- -- 150.2*   ECG:  Per cards. Lines: L IJ TLC 12/15>>>12/19  A: NSTEMI, nonobstructive lesions seen on cath.  Troponin dropping even with deteriorating renal function. P:  - Per cards. - Monitor troponins. - HTN management per cards.  RENAL  Lab 07/01/12 0535 06/30/12 0350 06/29/12 0430 06/28/12 0430 06/27/12 2315  NA 139 138 137 134* 135  K 4.1 3.6 -- -- --  CL 103 103 106 105 106  CO2 28 29 24 22 23   BUN 11 12 12 8 8   CREATININE 0.58 0.52 0.41* 0.44* 0.43*  CALCIUM 9.0 9.1 8.2* 8.2* 7.8*  MG 2.0 2.0 2.2 1.7 --  PHOS 3.2 2.9 2.5 2.5 --   Intake/Output      12/19 0701 - 12/20 0700 12/20  0701 - 12/21 0700   P.O. 400 150   I.V. (mL/kg) 70 (1.1)    NG/GT     IV Piggyback 300    Total Intake(mL/kg) 770 (12.3) 150 (2.4)   Urine (mL/kg/hr) 1000 (0.7) 150   Stool 0 1   Total Output 1000 151   Net -230 -1        Urine Occurrence 1 x    Stool Occurrence 2 x      Intake/Output Summary (Last 24 hours) at 07/01/12 1017 Last data filed at 07/01/12 0800  Gross per 24 hour  Intake    660 ml  Output   1151 ml  Net   -491 ml   Foley:  12/15>>>  A:  Patient completely aneuric, cr  ok and electrolytes WNL. P:   - Renal U/S negative for hydro, aneuria improved with hydration, CVP noted. - KVO IVF. - Diureses per cards. - Replace electrolytes as needed.  GASTROINTESTINAL  Lab 06/27/12 2315 06/26/12 2320  AST 15 13  ALT 11 15  ALKPHOS 77 116  BILITOT 0.1* 0.2*  PROT 5.2* 8.0  ALBUMIN 2.3* 3.8   A:  No acute P:   - Diet per speech recommendations.  HEMATOLOGIC  Lab 07/01/12 0535 06/30/12 0350 06/29/12 0430 06/28/12 0430 06/27/12 2315 06/26/12 2320  HGB 10.0* 9.9* 9.7* 10.2* 9.8* --  HCT 29.9* 29.8* 29.6* 31.1* 30.0* --  PLT 277 272 260 272 251 --  INR -- -- -- -- -- 0.97  APTT -- -- -- -- -- 30   A:  No active issues. P:  - Daily CBC.  INFECTIOUS  Lab 07/01/12 0535 06/30/12 0350 06/29/12 0430 06/28/12 0430 06/27/12 2315  WBC 11.7* 11.3* 13.4* 13.0* 12.0*  PROCALCITON -- -- -- -- --   Cultures: Blood 12/16>>>Coag negative staph 2/2, repeat pending. Urine 12/16>>>NTD. Sputum 12/16>>>NTD.  Antibiotics: Rocephin 12/16>>>12/23 Zithomax 12/16>>>12/20 Vancomycin 12/17>>>2/18  A:  ?CAP P:   - Rocephin and zithromax as designated above.  ENDOCRINE  Lab 07/01/12 0821 06/30/12 2111 06/30/12 1700 06/30/12 1228 06/30/12 0728  GLUCAP 170* 196* 347* 110* 168*   A:  DM history.   P:   CBG and SSI.  NEUROLOGIC  A:  Sedation protocol. P:   D/C sedation.  PCCM sign off, please cal back if needed.  Koren Bound, M.D. Pulmonary and Critical Care Medicine [facility] Pager: [phone]  07/01/2012, 10:17 AM

## 2012-07-01 NOTE — Progress Notes (Signed)
Inpatient Diabetes Program Recommendations  AACE/ADA: New Consensus Statement on Inpatient Glycemic Control (2013)  Target Ranges:  Prepandial:   less than 140 mg/dL      Peak postprandial:   less than 180 mg/dL (1-2 hours)      Critically ill patients:  140 - 180 mg/dL  Inpatient Diabetes Program Recommendations HgbA1C: Please order to assess prehospital glucose control Thank you  Piedad Climes [facility] Inpatient Diabetes Coordinator [phone]

## 2012-07-01 NOTE — Consult Note (Signed)
Physical Medicine and Rehabilitation Consult Reason for Consult: Deconditioning/STEMI Referring Physician: Critical care   HPI: Christine Durham is a 71 y.o. 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 Patient moved from [place] after most recent stroke 2011 the liver there sister. She was independent with a cane/walker prior to admission. Presented 06/27/2012 with acute onset of shortness of breath and wheezing. She denied any chest pain. Patient was noted to be hypoxic on presentation and tachycardic in the emergency department. EKG showed 2 mm of anterior lateral ST elevation with code NSTEMI was called. Patient with increased respiratory distress and was intubated. Cardiac catheterization completed showing no evidence of obstructive coronary artery disease she was severely reduced LV systolic function due to stress induced cardiomyopathy. Patient remained on Aggrenox therapy prior to admission as well as subcutaneous Lovenox added for DVT prophylaxis. She was extubated on 06/29/2012. Physical therapy evaluation completed with recommendations for physical medicine rehabilitation consult to consider inpatient rehabilitation services   Review of Systems  Respiratory: Positive for cough and wheezing.   Gastrointestinal: Positive for constipation.  Musculoskeletal: Positive for myalgias.  Neurological: Positive for weakness.  All other systems reviewed and are negative.   Past Medical History  Diagnosis Date  . Stroke   . Diabetes mellitus without complication   . Asthma    Past Surgical History  Procedure Date  . Appendectomy    History reviewed. No pertinent family history. Social History:  reports that she has been smoking.  She does not have any smokeless tobacco history on file. She reports that she does not drink alcohol or use illicit drugs. Allergies: No Known  Allergies Medications Prior to Admission  Medication Sig Dispense Refill  . dipyridamole-aspirin (AGGRENOX) 200-25 MG per 12 hr capsule Take 1 capsule by mouth 2 (two) times daily.      . metFORMIN (GLUCOPHAGE) 500 MG tablet Take 500 mg by mouth 2 (two) times daily with a meal.        Home: Home Living Lives With: Family (sister) Available Help at Discharge: Family;Available 24 hours/day (unsure how much sister can help her) Type of Home: House Home Access: Stairs to enter [employer] of Steps: 1 Home Layout: One level Bathroom Shower/Tub: Tub/shower unit Home Adaptive Equipment: Tub transfer bench;Walker - rolling;Straight cane  Functional History: Prior Function Driving: No Vocation: On disability Functional Status:  Mobility: Bed Mobility Bed Mobility: Supine to Sit Supine to Sit: 4: Min guard;HOB elevated;With rails (20 degrees) Transfers Transfers: Sit to Stand;Stand to Sit Sit to Stand: 3: Mod assist;From bed;With upper extremity assist;With armrests Stand to Sit: 4: Min assist;To chair/3-in-1;With upper extremity assist;With armrests Ambulation/Gait Ambulation/Gait Assistance: 4: Min assist Ambulation Distance (Feet): 25 Feet Assistive device: Rolling walker Ambulation/Gait Assistance Details: cues for tall posture and facilitation for trunk stability Gait Pattern: Left genu recurvatum;Decreased step length - left;Decreased stance time - left General Gait Details: ambulates on the lateral boder of left foot, appears AFO everts her ankle slightly (possibly loose?) Stairs: No Wheelchair Mobility Wheelchair Mobility: No  ADL:    Cognition: Cognition Arousal/Alertness: Awake/alert Orientation Level: Oriented X4 Cognition Overall Cognitive Status: Impaired (possibly at baseline) Area of Impairment: Safety/judgement;Attention Arousal/Alertness: Awake/alert Orientation Level: Appears intact for tasks assessed Behavior During Session: [facility] for tasks  performed Current Attention Level: Selective;Alternating Attention - Other Comments: highly distractable needing redirection throughout session Safety/Judgement: Decreased safety judgement for tasks assessed;Impulsive  Blood pressure 142/71, pulse 93,  temperature 98.3 F (36.8 C), temperature source Oral, resp. rate 20, height 5\' 4"  (1.626 m), weight 62.7 kg (138 lb 3.7 oz), SpO2 96.00%. Physical Exam  Vitals reviewed. Constitutional: She is oriented to person, place, and time.  HENT:  Head: Normocephalic.  Eyes:       Pupils reactive to light  Neck: Neck supple. No thyromegaly present.  Cardiovascular: Normal rate and regular rhythm.   Pulmonary/Chest:       Decreased breath sounds at the bases but clear to auscultation, dyspneic with conversation  Abdominal: Soft. Bowel sounds are normal. She exhibits no distension.  Neurological: She is alert and oriented to person, place, and time.       She follows simple commands. Mild left facial droop and weakness. LUE is 4-, LLE 3+ to -. Mild sensory loss to LT on the left. Right side grossly 4/5. Cognitively seems to display reasonable insight and awareness.  Skin: Skin is warm and dry.  Psychiatric: She has a normal mood and affect.    Results for orders placed during the hospital encounter of 06/26/12 (from the past 24 hour(s))  GLUCOSE, CAPILLARY     Status: Abnormal   Collection Time   06/30/12 12:28 PM      Component Value Range   Glucose-Capillary 110 (*) 70 - 99 mg/dL  GLUCOSE, CAPILLARY     Status: Abnormal   Collection Time   06/30/12  5:00 PM      Component Value Range   Glucose-Capillary 347 (*) 70 - 99 mg/dL  GLUCOSE, CAPILLARY     Status: Abnormal   Collection Time   06/30/12  9:11 PM      Component Value Range   Glucose-Capillary 196 (*) 70 - 99 mg/dL  BASIC METABOLIC PANEL     Status: Abnormal   Collection Time   07/01/12  5:35 AM      Component Value Range   Sodium 139  135 - 145 mEq/L   Potassium 4.1  3.5 - 5.1  mEq/L   Chloride 103  96 - 112 mEq/L   CO2 28  19 - 32 mEq/L   Glucose, Bld 245 (*) 70 - 99 mg/dL   BUN 11  6 - 23 mg/dL   Creatinine, Ser 4.09  0.50 - 1.10 mg/dL   Calcium 9.0  8.4 - 81.1 mg/dL   GFR calc non Af Amer >90  >90 mL/min   GFR calc Af Amer >90  >90 mL/min  CBC     Status: Abnormal   Collection Time   07/01/12  5:35 AM      Component Value Range   WBC 11.7 (*) 4.0 - 10.5 K/uL   RBC 3.34 (*) 3.87 - 5.11 MIL/uL   Hemoglobin 10.0 (*) 12.0 - 15.0 g/dL   HCT 91.4 (*) 78.2 - 95.6 %   MCV 89.5  78.0 - 100.0 fL   MCH 29.9  26.0 - 34.0 pg   MCHC 33.4  30.0 - 36.0 g/dL   RDW 21.3  08.6 - 57.8 %   Platelets 277  150 - 400 K/uL  MAGNESIUM     Status: Normal   Collection Time   07/01/12  5:35 AM      Component Value Range   Magnesium 2.0  1.5 - 2.5 mg/dL  PHOSPHORUS     Status: Normal   Collection Time   07/01/12  5:35 AM      Component Value Range   Phosphorus 3.2  2.3 - 4.6 mg/dL  Dg Chest Port 1 View  06/30/2012  *RADIOLOGY REPORT*  Clinical Data: Shortness of breath, post extubation  PORTABLE CHEST - 1 VIEW  Comparison: 06/29/2012  Findings: Endotracheal tube has been removed.  No diagnostic pneumothorax.  The cardiomediastinal silhouette is stable. Persistent right basilar atelectasis or infiltrate.  Left basilar atelectasis.  Stable left subclavian central line position with tip in the innominate vein.  No pulmonary edema.  IMPRESSION: Endotracheal tube has been removed.  No diagnostic pneumothorax. No pulmonary edema.  Right basilar atelectasis or infiltrate.  Left basilar atelectasis.  Stable left subclavian central line position.   Original Report Authenticated By: Natasha Mead, M.D.     Assessment/Plan: Diagnosis: deconditioning after respiratory failure, MI//hx of prior right CVA 1. Does the need for close, 24 hr/day medical supervision in concert with the patient's rehab needs make it unreasonable for this patient to be served in a less intensive setting?  Yes 2. Co-Morbidities requiring supervision/potential complications: DM,CM, 3. Due to bladder management, bowel management, safety, skin/wound care, disease management, medication administration and patient education, does the patient require 24 hr/day rehab nursing? Yes 4. Does the patient require coordinated care of a physician, rehab nurse, PT (1-2 hrs/day, 5 days/week) and OT (1-2 hrs/day, 5 days/week) to address physical and functional deficits in the context of the above medical diagnosis(es)? Yes Addressing deficits in the following areas: balance, endurance, locomotion, strength, transferring, bowel/bladder control, bathing, dressing, feeding, grooming and toileting 5. Can the patient actively participate in an intensive therapy program of at least 3 hrs of therapy per day at least 5 days per week? Yes 6. The potential for patient to make measurable gains while on inpatient rehab is excellent 7. Anticipated functional outcomes upon discharge from inpatient rehab are mod I to supevision with PT, mod I to min assist with OT, n/a with SLP. 8. Estimated rehab length of stay to reach the above functional goals is: 10 days 9. Does the patient have adequate social supports to accommodate these discharge functional goals? Yes 10. Anticipated D/C setting: Home 11. Anticipated post D/C treatments: HH therapy 12. Overall Rehab/Functional Prognosis: excellent  RECOMMENDATIONS: This patient's condition is appropriate for continued rehabilitative care in the following setting: CIR Patient has agreed to participate in recommended program. Yes Note that insurance prior authorization may be required for reimbursement for recommended care.  Comment: Pt still with conversational dyspnea. Will follow for medical progress. Rehab RN to follow up.   Ivory Broad, MD     07/01/2012

## 2012-07-01 NOTE — Progress Notes (Signed)
Pt arrived via wheelchair from 2900.  Pt has no complaints.  Will con't to monitor.

## 2012-07-02 DIAGNOSIS — I428 Other cardiomyopathies: Secondary | ICD-10-CM

## 2012-07-02 LAB — GLUCOSE, CAPILLARY: Glucose-Capillary: 193 mg/dL — ABNORMAL HIGH (ref 70–99)

## 2012-07-02 NOTE — Progress Notes (Signed)
Patient ID: Zela Sobieski, female   DOB: Aug 22, 1940, 71 y.o.   MRN: [id number]   Patient Name: Christine Durham Date of Encounter: 07/02/2012     Principal Problem:  *Acute on chronic systolic CHF (congestive heart failure) Active Problems:  Acute respiratory failure  Diabetes mellitus without complication  NICM (nonischemic cardiomyopathy)  Hypotension  Non-ST elevation myocardial infarction (NSTEMI), initial care episode - Type 2 secondary to respiratory issues  Tobacco abuse   SUBJECTIVE: Less dyspnic eating breakfast with no oxygen on.   OBJECTIVE  Filed Vitals:   07/02/12 0242 07/02/12 0440 07/02/12 0815 07/02/12 0917  BP:  134/62  113/61  Pulse:  68    Temp:  98.1 F (36.7 C)    TempSrc:  Oral    Resp:  18    Height:      Weight:      SpO2: 96% 97% 96%     Intake/Output Summary (Last 24 hours) at 07/02/12 0946 Last data filed at 07/02/12 4098  Gross per 24 hour  Intake    661 ml  Output    575 ml  Net     86 ml   Weight change: -14.1 oz (-0.4 kg)  PHYSICAL EXAM  General: Appears older than stated age, in no acute distress. Head: Normocephalic, sparse hair distribution, atraumatic, sclera non-icteric, no xanthomas, nares are without discharge, poor dentition  Neck: Supple without bruits or JVD. Lungs:  RR 14  non labored, accessory muscles used when speaking, expiratory wheeze and rhonchi which clears with cough noted Heart: Distant heart sounds, RRR no s3, s4, or murmurs. Abdomen: Soft, non-tender, non-distended, BS + x 4.  Msk:  Strength and tone appears normal for age. Extremities: No clubbing, cyanosis or edema. DP/PT/Radials 2+ and equal bilaterally. Neuro: Alert and oriented X 3. Moves all extremities spontaneously. Psych: Normal affect.  LABS:  Recent Labs  [facility] 07/01/12 0535 06/30/12 0350   WBC 11.7* 11.3*   HGB 10.0* 9.9*   HCT 29.9* 29.8*   MCV 89.5 90.3   PLT 277 272    Lab 07/01/12 0535 06/30/12 0350 06/29/12 0430 06/27/12 2315  NA  139 138 137 --  K 4.1 3.6 3.5 --  CL 103 103 106 --  CO2 28 29 24  --  BUN 11 12 12  --  CREATININE 0.58 0.52 0.41* --  CALCIUM 9.0 9.1 8.2* --  PROT -- -- -- 5.2*  BILITOT -- -- -- 0.1*  ALKPHOS -- -- -- 77  ALT -- -- -- 11  AST -- -- -- 15  AMYLASE -- -- -- --  LIPASE -- -- -- --  GLUCOSE 245* 125* 252* --    TELE: NSR, sinus tachycardia intermittently with hypoxia  Radiology/Studies:  US Renal  06/27/2012  *RADIOLOGY REPORT*  Clinical Data: Acute renal failure.  Diabetes.  Rule out hydronephrosis.  RENAL/URINARY TRACT ULTRASOUND COMPLETE  Comparison:  None.  Findings:  Right Kidney:  The right kidney is isoechoic to the index organ, the liver.  It is somewhat small measuring 9.8 cm.  No focal lesions are present.  Left Kidney:  The left kidney is isoechoic to the index organ, the spleen.  There is no hydronephrosis.  No stones or mass lesions are present.  The maximal length is 10.0 cm.  Bladder:  A Foley catheter is in place.  IMPRESSION:  1.  Somewhat small hyperechoic kidneys bilaterally, compatible with chronic medical renal disease. 2.  No mass lesion or evidence for obstruction.   Original Report  Authenticated By: Marin Roberts, M.D.    Dg Chest Port 1 View  06/30/2012  *RADIOLOGY REPORT*  Clinical Data: Shortness of breath, post extubation  PORTABLE CHEST - 1 VIEW  Comparison: 06/29/2012  Findings: Endotracheal tube has been removed.  No diagnostic pneumothorax.  The cardiomediastinal silhouette is stable. Persistent right basilar atelectasis or infiltrate.  Left basilar atelectasis.  Stable left subclavian central line position with tip in the innominate vein.  No pulmonary edema.  IMPRESSION: Endotracheal tube has been removed.  No diagnostic pneumothorax. No pulmonary edema.  Right basilar atelectasis or infiltrate.  Left basilar atelectasis.  Stable left subclavian central line position.   Original Report Authenticated By: Natasha Mead, M.D.    Dg Chest Port 1  View  06/29/2012  *RADIOLOGY REPORT*  Clinical Data: Shortness of breath.  Endotracheal tube position.  PORTABLE CHEST - 1 VIEW  Comparison: 06/28/2012  Findings: 0550 hours.  Endotracheal tube tip is 3.9 cm above the base of the carina. The NG tube passes into the stomach although the distal tip position is not included on the film.  The tip of the left subclavian central line has pulled back in the interval and is now positioned at the innominate vein confluence.  Cardiopericardial silhouette is stable.  There is bibasilar collapse / consolidation, left greater than right.  Probable tiny bilateral pleural effusions. Telemetry leads overlie the chest.  IMPRESSION: The left subclavian central line tip has pulled back in the interval and is now at the level of the innominate vein confluence.  Persistent bibasilar collapse / consolidation.   Original Report Authenticated By: Kennith Center, M.D.    Dg Chest Port 1 View  06/28/2012  *RADIOLOGY REPORT*  Clinical Data: Shortness of breath.  Assess endotracheal tube position.  PORTABLE CHEST - 1 VIEW  Comparison: 06/27/2012.  Findings: The support apparatus is stable.  Low lung volumes with vascular crowding and bibasilar atelectasis.  No large pleural effusion or pulmonary edema.  IMPRESSION:  1.  Stable support apparatus. 2.  Lower lung volumes with vascular crowding and bibasilar atelectasis.   Original Report Authenticated By: Rudie Meyer, M.D.    Dg Chest Port 1 View  06/27/2012  *RADIOLOGY REPORT*  Clinical Data: Central line placement.  PORTABLE CHEST - 1 VIEW  Comparison: Chest x-ray 06/27/2012.  Findings: An endotracheal tube is in place with tip 3.4 cm above the carina. There is a left-sided subclavian central venous catheter with tip terminating in the distal superior vena cava. A nasogastric tube is seen extending into the stomach, however, the tip of the nasogastric tube extends below the lower margin of the image.  Lung volumes are slightly low.   Linear bibasilar opacities are favored to represent subsegmental atelectasis.  No definite pleural effusions.  Pulmonary vascular crowding related to low lung volumes, without frank pulmonary edema.  Heart size is normal. Mediastinal contours are unremarkable.  Atherosclerosis of the thoracic aorta.  IMPRESSION: 1.  Support apparatus, as above. 2.  Low lung volumes with probable bibasilar subsegmental atelectasis.   Original Report Authenticated By: Trudie Reed, M.D.    Dg Chest Port 1 View  06/27/2012  *RADIOLOGY REPORT*  Clinical Data: Check ET position  PORTABLE CHEST - 1 VIEW  Comparison: 06/26/2012  Findings: Endotracheal tube tip 3.7 cm proximal to the carina. Bibasilar opacities.  Interstitial coarsening.  Right hilar prominence.  No pneumothorax.  No definite pleural effusion. Cardiac contour within normal limits. No interval osseous change.  IMPRESSION: Endotracheal tube tip 3.7 cm proximal  to the carina.  Bibasilar opacities; atelectasis, aspiration, or infiltrate.  Right hilar prominence is nonspecific, may reflect vascular congestion.  Recommend attention at two-view follow-up when the patient is clinically able.   Original Report Authenticated By: Jearld Lesch, M.D.    Dg Chest Port 1 View  06/26/2012  *RADIOLOGY REPORT*  Clinical Data: Shortness of breath.  PORTABLE CHEST - 1 VIEW  Comparison: None.  Findings: Adequately inflated without focal consolidation or effusion.  There is mild prominence of the perihilar markings which may be due to minimal vascular congestion.  Cardiomediastinal silhouette is within normal.  There is minimal spondylosis of the spine.  IMPRESSION: Suggestion of minimal vascular congestion.   Original Report Authenticated By: Elberta Fortis, M.D.     Current Medications:     . antiseptic oral rinse  15 mL Mouth Rinse BID  . azithromycin  500 mg Oral Daily  . carvedilol  6.25 mg Oral BID WC  . cefTRIAXone (ROCEPHIN)  IV  1 g Intravenous Q24H  .  dipyridamole-aspirin  1 capsule Oral BID  . enoxaparin (LOVENOX) injection  40 mg Subcutaneous Q24H  . insulin aspart  0-15 Units Subcutaneous TID WC  . insulin aspart  0-5 Units Subcutaneous QHS  . ipratropium  0.5 mg Nebulization Q6H  . levalbuterol  0.63 mg Nebulization Q6H  . losartan  50 mg Oral Daily  . metFORMIN  500 mg Oral BID WC  . methylPREDNISolone (SOLU-MEDROL) injection  40 mg Intravenous Q12H  . multivitamin with minerals  1 tablet Oral Daily  . pantoprazole  40 mg Oral QAC breakfast  . potassium chloride  40 mEq Oral Once  . sodium chloride  3 mL Intravenous Q12H    ASSESSMENT:  Ms. Wearing is a 71 yo with hx of COPD, DM, CVA who was transferred from [facility] with respiratory failure. She was thought to have had a STEMI. Cath 12/16 reveals nonobstructive CAD, elevated LVEDP, LVEF 25%, WMAs c/w stress-induced cardiomyopathy.  1. VDRF s/p extubation 12/19 2. Acute systolic CHF/NICM 3. NSTEMI, type 2 4. Asthma 5. DM2 6. HTN 7. H/o CVA 8. Tobacco abuse 9. Anuria  - resolved  DISCUSSION/PLAN:   Patient successfully extubated yesterday. Acute on chronic respiratory failure multifactorial 2/2 acute systolic CHF believed to be mediated by stress-induced cardiomyopathy and underlying asthma +/- A/COPD with continued tobacco abuse.  D/C iv antibiotic in am. after 5 days.  From a cardiac perspective, she appears fairly euvolemic. No pulmonary edema on CXR yesterday. Adventitious lung sounds likely from primary pulmonary disease. Continue ARB and BB. Add low-dose ASA and statin. She will need repeat echo after discharge. Okay to transfer to stepdown. Inpatient rehab recommended, and deemed appropriate once medically stable. Will plan on this once tuned up. Otherwise continue SSI. Tobacco cessation stressed. Anuria resolved. No acute process on renal u/s 12/16. BP and renal function stable. She is ready to go to rehab over the week end   Charlton Haws 9:49 AM  07/02/2012

## 2012-07-03 LAB — GLUCOSE, CAPILLARY
Glucose-Capillary: 194 mg/dL — ABNORMAL HIGH (ref 70–99)
Glucose-Capillary: 191 mg/dL — ABNORMAL HIGH (ref 70–99)
Glucose-Capillary: 223 mg/dL — ABNORMAL HIGH (ref 70–99)

## 2012-07-03 NOTE — Progress Notes (Signed)
Patient ID: Christine Durham, female   DOB: February 08, 1941, 71 y.o.   MRN: [id number]   Patient Name: Christine Durham Date of Encounter: 07/03/2012     Principal Problem:  *Acute on chronic systolic CHF (congestive heart failure) Active Problems:  Acute respiratory failure  Diabetes mellitus without complication  NICM (nonischemic cardiomyopathy)  Hypotension  Non-ST elevation myocardial infarction (NSTEMI), initial care episode - Type 2 secondary to respiratory issues  Tobacco abuse   SUBJECTIVE: Less dyspnic eating breakfast with no oxygen on.   OBJECTIVE  Filed Vitals:   07/02/12 1424 07/02/12 1728 07/02/12 2017 07/03/12 0557  BP: 120/64 128/61 134/70 137/69  Pulse: 70 90 92 87  Temp: 98.4 F (36.9 C)  98.1 F (36.7 C) 97.6 F (36.4 C)  TempSrc: Oral  Oral Oral  Resp: 18  18 20   Height:      Weight:      SpO2: 96%  90% 96%    Intake/Output Summary (Last 24 hours) at 07/03/12 6440 Last data filed at 07/03/12 0232  Gross per 24 hour  Intake    723 ml  Output   1026 ml  Net   -303 ml   Weight change:   PHYSICAL EXAM  General: Appears older than stated age, in no acute distress. Head: Normocephalic, sparse hair distribution, atraumatic, sclera non-icteric, no xanthomas, nares are without discharge, poor dentition  Neck: Supple without bruits or JVD. Lungs:  RR 14  non labored, accessory muscles used when speaking, expiratory wheeze and rhonchi which clears with cough noted Heart: Distant heart sounds, RRR no s3, s4, or murmurs. Abdomen: Soft, non-tender, non-distended, BS + x 4.  Msk:  Strength and tone appears normal for age. Extremities: No clubbing, cyanosis or edema. DP/PT/Radials 2+ and equal bilaterally. Neuro: Alert and oriented X 3. Moves all extremities spontaneously. Psych: Normal affect.  LABS:  Recent Labs  [facility] 07/01/12 0535   WBC 11.7*   HGB 10.0*   HCT 29.9*   MCV 89.5   PLT 277    Lab 07/01/12 0535 06/30/12 0350 06/29/12 0430 06/27/12  2315  NA 139 138 137 --  K 4.1 3.6 3.5 --  CL 103 103 106 --  CO2 28 29 24  --  BUN 11 12 12  --  CREATININE 0.58 0.52 0.41* --  CALCIUM 9.0 9.1 8.2* --  PROT -- -- -- 5.2*  BILITOT -- -- -- 0.1*  ALKPHOS -- -- -- 77  ALT -- -- -- 11  AST -- -- -- 15  AMYLASE -- -- -- --  LIPASE -- -- -- --  GLUCOSE 245* 125* 252* --    TELE: NSR, sinus tachycardia intermittently with hypoxia  Radiology/Studies:  US Renal  06/27/2012  *RADIOLOGY REPORT*  Clinical Data: Acute renal failure.  Diabetes.  Rule out hydronephrosis.  RENAL/URINARY TRACT ULTRASOUND COMPLETE  Comparison:  None.  Findings:  Right Kidney:  The right kidney is isoechoic to the index organ, the liver.  It is somewhat small measuring 9.8 cm.  No focal lesions are present.  Left Kidney:  The left kidney is isoechoic to the index organ, the spleen.  There is no hydronephrosis.  No stones or mass lesions are present.  The maximal length is 10.0 cm.  Bladder:  A Foley catheter is in place.  IMPRESSION:  1.  Somewhat small hyperechoic kidneys bilaterally, compatible with chronic medical renal disease. 2.  No mass lesion or evidence for obstruction.   Original Report Authenticated By: Marin Roberts, M.D.  Dg Chest Port 1 View  06/30/2012  *RADIOLOGY REPORT*  Clinical Data: Shortness of breath, post extubation  PORTABLE CHEST - 1 VIEW  Comparison: 06/29/2012  Findings: Endotracheal tube has been removed.  No diagnostic pneumothorax.  The cardiomediastinal silhouette is stable. Persistent right basilar atelectasis or infiltrate.  Left basilar atelectasis.  Stable left subclavian central line position with tip in the innominate vein.  No pulmonary edema.  IMPRESSION: Endotracheal tube has been removed.  No diagnostic pneumothorax. No pulmonary edema.  Right basilar atelectasis or infiltrate.  Left basilar atelectasis.  Stable left subclavian central line position.   Original Report Authenticated By: Natasha Mead, M.D.    Dg Chest Port 1  View  06/29/2012  *RADIOLOGY REPORT*  Clinical Data: Shortness of breath.  Endotracheal tube position.  PORTABLE CHEST - 1 VIEW  Comparison: 06/28/2012  Findings: 0550 hours.  Endotracheal tube tip is 3.9 cm above the base of the carina. The NG tube passes into the stomach although the distal tip position is not included on the film.  The tip of the left subclavian central line has pulled back in the interval and is now positioned at the innominate vein confluence.  Cardiopericardial silhouette is stable.  There is bibasilar collapse / consolidation, left greater than right.  Probable tiny bilateral pleural effusions. Telemetry leads overlie the chest.  IMPRESSION: The left subclavian central line tip has pulled back in the interval and is now at the level of the innominate vein confluence.  Persistent bibasilar collapse / consolidation.   Original Report Authenticated By: Kennith Center, M.D.    Dg Chest Port 1 View  06/28/2012  *RADIOLOGY REPORT*  Clinical Data: Shortness of breath.  Assess endotracheal tube position.  PORTABLE CHEST - 1 VIEW  Comparison: 06/27/2012.  Findings: The support apparatus is stable.  Low lung volumes with vascular crowding and bibasilar atelectasis.  No large pleural effusion or pulmonary edema.  IMPRESSION:  1.  Stable support apparatus. 2.  Lower lung volumes with vascular crowding and bibasilar atelectasis.   Original Report Authenticated By: Rudie Meyer, M.D.    Dg Chest Port 1 View  06/27/2012  *RADIOLOGY REPORT*  Clinical Data: Central line placement.  PORTABLE CHEST - 1 VIEW  Comparison: Chest x-ray 06/27/2012.  Findings: An endotracheal tube is in place with tip 3.4 cm above the carina. There is a left-sided subclavian central venous catheter with tip terminating in the distal superior vena cava. A nasogastric tube is seen extending into the stomach, however, the tip of the nasogastric tube extends below the lower margin of the image.  Lung volumes are slightly low.   Linear bibasilar opacities are favored to represent subsegmental atelectasis.  No definite pleural effusions.  Pulmonary vascular crowding related to low lung volumes, without frank pulmonary edema.  Heart size is normal. Mediastinal contours are unremarkable.  Atherosclerosis of the thoracic aorta.  IMPRESSION: 1.  Support apparatus, as above. 2.  Low lung volumes with probable bibasilar subsegmental atelectasis.   Original Report Authenticated By: Trudie Reed, M.D.    Dg Chest Port 1 View  06/27/2012  *RADIOLOGY REPORT*  Clinical Data: Check ET position  PORTABLE CHEST - 1 VIEW  Comparison: 06/26/2012  Findings: Endotracheal tube tip 3.7 cm proximal to the carina. Bibasilar opacities.  Interstitial coarsening.  Right hilar prominence.  No pneumothorax.  No definite pleural effusion. Cardiac contour within normal limits. No interval osseous change.  IMPRESSION: Endotracheal tube tip 3.7 cm proximal to the carina.  Bibasilar opacities; atelectasis, aspiration,  or infiltrate.  Right hilar prominence is nonspecific, may reflect vascular congestion.  Recommend attention at two-view follow-up when the patient is clinically able.   Original Report Authenticated By: Jearld Lesch, M.D.    Dg Chest Port 1 View  06/26/2012  *RADIOLOGY REPORT*  Clinical Data: Shortness of breath.  PORTABLE CHEST - 1 VIEW  Comparison: None.  Findings: Adequately inflated without focal consolidation or effusion.  There is mild prominence of the perihilar markings which may be due to minimal vascular congestion.  Cardiomediastinal silhouette is within normal.  There is minimal spondylosis of the spine.  IMPRESSION: Suggestion of minimal vascular congestion.   Original Report Authenticated By: Elberta Fortis, M.D.     Current Medications:     . antiseptic oral rinse  15 mL Mouth Rinse BID  . azithromycin  500 mg Oral Daily  . carvedilol  6.25 mg Oral BID WC  . dipyridamole-aspirin  1 capsule Oral BID  . enoxaparin  (LOVENOX) injection  40 mg Subcutaneous Q24H  . insulin aspart  0-15 Units Subcutaneous TID WC  . insulin aspart  0-5 Units Subcutaneous QHS  . ipratropium  0.5 mg Nebulization Q6H  . levalbuterol  0.63 mg Nebulization Q6H  . losartan  50 mg Oral Daily  . metFORMIN  500 mg Oral BID WC  . methylPREDNISolone (SOLU-MEDROL) injection  40 mg Intravenous Q12H  . multivitamin with minerals  1 tablet Oral Daily  . pantoprazole  40 mg Oral QAC breakfast  . potassium chloride  40 mEq Oral Once  . sodium chloride  3 mL Intravenous Q12H    ASSESSMENT:  Ms. Trovato is a 71 yo with hx of COPD, DM, CVA who was transferred from [facility] with respiratory failure. She was thought to have had a STEMI. Cath 12/16 reveals nonobstructive CAD, elevated LVEDP, LVEF 25%, WMAs c/w stress-induced cardiomyopathy.  1. VDRF s/p extubation 12/19 2. Acute systolic CHF/NICM 3. NSTEMI, type 2 4. Asthma 5. DM2 6. HTN 7. H/o CVA 8. Tobacco abuse 9. Anuria  - resolved  DISCUSSION/PLAN:   Patient successfully extubated yesterday. Acute on chronic respiratory failure multifactorial 2/2 acute systolic CHF believed to be mediated by stress-induced cardiomyopathy and underlying asthma +/- A/COPD with continued tobacco abuse. IV antibiotics D/C  From a cardiac perspective, she appears fairly euvolemic. No pulmonary edema on CXR 12/18 . Adventitious lung sounds likely from primary pulmonary disease. Continue ARB and BB. Add low-dose ASA and statin. She will need repeat echo after discharge. Okay to transfer to inpatient rehab when bed available ? Monday  Needs to stop smoking Charlton Haws 8:19 AM  07/03/2012

## 2012-07-03 NOTE — Progress Notes (Signed)
Telemetry monitors down periodically.  Rhythm confirmed post return of monitoring.  Pt remains in NSR at this time.  Will continue to monitor. Nino Glow RN

## 2012-07-04 ENCOUNTER — Inpatient Hospital Stay (HOSPITAL_COMMUNITY)
Admission: RE | Admit: 2012-07-04 | Discharge: 2012-07-08 | DRG: 945 | Disposition: A | Discharge status: Home / Self Care | Payer: Medicare Other | Source: Ambulatory Visit | Attending: Physical Medicine & Rehabilitation | Admitting: Physical Medicine & Rehabilitation

## 2012-07-04 ENCOUNTER — Encounter (HOSPITAL_COMMUNITY): Payer: Self-pay | Admitting: *Deleted

## 2012-07-04 DIAGNOSIS — J9600 Acute respiratory failure, unspecified whether with hypoxia or hypercapnia: Secondary | ICD-10-CM | POA: Diagnosis present

## 2012-07-04 DIAGNOSIS — K5900 Constipation, unspecified: Secondary | ICD-10-CM | POA: Diagnosis present

## 2012-07-04 DIAGNOSIS — I69998 Other sequelae following unspecified cerebrovascular disease: Secondary | ICD-10-CM

## 2012-07-04 DIAGNOSIS — J449 Chronic obstructive pulmonary disease, unspecified: Secondary | ICD-10-CM | POA: Diagnosis present

## 2012-07-04 DIAGNOSIS — I214 Non-ST elevation (NSTEMI) myocardial infarction: Secondary | ICD-10-CM | POA: Diagnosis present

## 2012-07-04 DIAGNOSIS — I639 Cerebral infarction, unspecified: Secondary | ICD-10-CM

## 2012-07-04 DIAGNOSIS — F17200 Nicotine dependence, unspecified, uncomplicated: Secondary | ICD-10-CM | POA: Diagnosis present

## 2012-07-04 DIAGNOSIS — J189 Pneumonia, unspecified organism: Secondary | ICD-10-CM | POA: Diagnosis present

## 2012-07-04 DIAGNOSIS — J45909 Unspecified asthma, uncomplicated: Secondary | ICD-10-CM | POA: Diagnosis present

## 2012-07-04 DIAGNOSIS — I10 Essential (primary) hypertension: Secondary | ICD-10-CM | POA: Diagnosis present

## 2012-07-04 DIAGNOSIS — E119 Type 2 diabetes mellitus without complications: Secondary | ICD-10-CM | POA: Diagnosis present

## 2012-07-04 DIAGNOSIS — R5381 Other malaise: Secondary | ICD-10-CM

## 2012-07-04 DIAGNOSIS — J4489 Other specified chronic obstructive pulmonary disease: Secondary | ICD-10-CM | POA: Diagnosis present

## 2012-07-04 DIAGNOSIS — Z5189 Encounter for other specified aftercare: Principal | ICD-10-CM

## 2012-07-04 DIAGNOSIS — I428 Other cardiomyopathies: Secondary | ICD-10-CM | POA: Diagnosis present

## 2012-07-04 LAB — GLUCOSE, CAPILLARY: Glucose-Capillary: 211 mg/dL — ABNORMAL HIGH (ref 70–99)

## 2012-07-04 LAB — CREATININE, SERUM
Creatinine, Ser: 0.58 mg/dL (ref 0.50–1.10)
GFR calc Af Amer: >90 mL/min (ref >90)

## 2012-07-04 MED ORDER — LOSARTAN POTASSIUM 50 MG PO TABS
50.0000 mg | ORAL_TABLET | Freq: Every day | ORAL | Status: DC
  Administered 2012-07-05 – 2012-07-08 (×4): 50 mg via ORAL
  Filled 2012-07-04 (×5): qty 1

## 2012-07-04 MED ORDER — CARVEDILOL 12.5 MG PO TABS: 12.5000 mg | ORAL_TABLET | Freq: Two times a day (BID) | ORAL | Status: DC

## 2012-07-04 MED ORDER — SORBITOL 70 % SOLN: 30.0000 mL | Freq: Every day | Status: DC | PRN

## 2012-07-04 MED ORDER — ONDANSETRON HCL 4 MG PO TABS: 4.0000 mg | ORAL_TABLET | Freq: Four times a day (QID) | ORAL | Status: DC | PRN

## 2012-07-04 MED ORDER — POLYETHYLENE GLYCOL 3350 17 G PO PACK
17.0000 g | PACK | Freq: Every day | ORAL | Status: DC | PRN
  Filled 2012-07-04: qty 1

## 2012-07-04 MED ORDER — AZITHROMYCIN 500 MG PO TABS
500.0000 mg | ORAL_TABLET | Freq: Every day | ORAL | Status: DC
  Administered 2012-07-05 – 2012-07-08 (×4): 500 mg via ORAL
  Filled 2012-07-04 (×5): qty 1

## 2012-07-04 MED ORDER — ALBUTEROL SULFATE HFA 108 (90 BASE) MCG/ACT IN AERS: 2.0000 | INHALATION_SPRAY | Freq: Four times a day (QID) | RESPIRATORY_TRACT | Status: DC | PRN

## 2012-07-04 MED ORDER — AZITHROMYCIN 500 MG PO TABS: 500.0000 mg | ORAL_TABLET | Freq: Every day | ORAL | Status: AC

## 2012-07-04 MED ORDER — LEVALBUTEROL HCL 0.63 MG/3ML IN NEBU: 0.6300 mg | INHALATION_SOLUTION | Freq: Four times a day (QID) | RESPIRATORY_TRACT | Status: DC

## 2012-07-04 MED ORDER — METFORMIN HCL 500 MG PO TABS
500.0000 mg | ORAL_TABLET | Freq: Two times a day (BID) | ORAL | Status: DC
  Administered 2012-07-04 – 2012-07-08 (×8): 500 mg via ORAL
  Filled 2012-07-04 (×10): qty 1

## 2012-07-04 MED ORDER — PANTOPRAZOLE SODIUM 40 MG PO TBEC
40.0000 mg | DELAYED_RELEASE_TABLET | Freq: Every day | ORAL | Status: DC
  Administered 2012-07-05 – 2012-07-08 (×4): 40 mg via ORAL
  Filled 2012-07-04 (×5): qty 1

## 2012-07-04 MED ORDER — BIOTENE DRY MOUTH MT LIQD
15.0000 mL | Freq: Two times a day (BID) | OROMUCOSAL | Status: DC
  Administered 2012-07-04 – 2012-07-08 (×7): 15 mL via OROMUCOSAL

## 2012-07-04 MED ORDER — ASPIRIN-DIPYRIDAMOLE ER 25-200 MG PO CP12
1.0000 | ORAL_CAPSULE | Freq: Two times a day (BID) | ORAL | Status: DC
  Administered 2012-07-04 – 2012-07-08 (×8): 1 via ORAL
  Filled 2012-07-04 (×10): qty 1

## 2012-07-04 MED ORDER — LOSARTAN POTASSIUM 50 MG PO TABS: 50.0000 mg | ORAL_TABLET | Freq: Every day | ORAL | Status: DC

## 2012-07-04 MED ORDER — PREDNISONE (PAK) 10 MG PO TABS: 40.0000 mg | ORAL_TABLET | Freq: Every day | ORAL | Status: DC

## 2012-07-04 MED ORDER — CARVEDILOL 12.5 MG PO TABS
12.5000 mg | ORAL_TABLET | Freq: Two times a day (BID) | ORAL | Status: DC
  Filled 2012-07-04 (×2): qty 1

## 2012-07-04 MED ORDER — INSULIN ASPART 100 UNIT/ML ~~LOC~~ SOLN
0.0000 [IU] | Freq: Three times a day (TID) | SUBCUTANEOUS | Status: DC
  Administered 2012-07-04: 8 [IU] via SUBCUTANEOUS
  Administered 2012-07-05: 2 [IU] via SUBCUTANEOUS
  Administered 2012-07-05: 5 [IU] via SUBCUTANEOUS
  Administered 2012-07-05 – 2012-07-06 (×2): 2 [IU] via SUBCUTANEOUS
  Administered 2012-07-06: 8 [IU] via SUBCUTANEOUS
  Administered 2012-07-06: 2 [IU] via SUBCUTANEOUS
  Administered 2012-07-07 (×2): 3 [IU] via SUBCUTANEOUS
  Administered 2012-07-07 – 2012-07-08 (×2): 2 [IU] via SUBCUTANEOUS

## 2012-07-04 MED ORDER — METHYLPREDNISOLONE SODIUM SUCC 40 MG IJ SOLR
40.0000 mg | Freq: Two times a day (BID) | INTRAMUSCULAR | Status: DC
  Administered 2012-07-04 – 2012-07-05 (×3): 40 mg via INTRAVENOUS
  Filled 2012-07-04 (×6): qty 1

## 2012-07-04 MED ORDER — ACETAMINOPHEN 325 MG PO TABS: 325.0000 mg | ORAL_TABLET | ORAL | Status: DC | PRN

## 2012-07-04 MED ORDER — ONDANSETRON HCL 4 MG/2ML IJ SOLN: 4.0000 mg | Freq: Four times a day (QID) | INTRAMUSCULAR | Status: DC | PRN

## 2012-07-04 MED ORDER — CARVEDILOL 12.5 MG PO TABS
12.5000 mg | ORAL_TABLET | Freq: Two times a day (BID) | ORAL | Status: DC
  Administered 2012-07-04 – 2012-07-08 (×8): 12.5 mg via ORAL
  Filled 2012-07-04 (×10): qty 1

## 2012-07-04 MED ORDER — ADULT MULTIVITAMIN W/MINERALS CH
1.0000 | ORAL_TABLET | Freq: Every day | ORAL | Status: DC
  Administered 2012-07-05 – 2012-07-08 (×4): 1 via ORAL
  Filled 2012-07-04 (×5): qty 1

## 2012-07-04 MED ORDER — ENOXAPARIN SODIUM 40 MG/0.4ML ~~LOC~~ SOLN
40.0000 mg | SUBCUTANEOUS | Status: DC
  Administered 2012-07-04 – 2012-07-07 (×4): 40 mg via SUBCUTANEOUS
  Filled 2012-07-04 (×5): qty 0.4

## 2012-07-04 MED ORDER — IPRATROPIUM BROMIDE 0.02 % IN SOLN: 0.5000 mg | Freq: Three times a day (TID) | RESPIRATORY_TRACT | Status: DC

## 2012-07-04 MED ORDER — IPRATROPIUM BROMIDE 0.02 % IN SOLN: 0.5000 mg | Freq: Four times a day (QID) | RESPIRATORY_TRACT | Status: DC

## 2012-07-04 MED ORDER — LEVALBUTEROL HCL 0.63 MG/3ML IN NEBU
0.6300 mg | INHALATION_SOLUTION | Freq: Three times a day (TID) | RESPIRATORY_TRACT | Status: DC
  Filled 2012-07-04 (×2): qty 3

## 2012-07-04 NOTE — Progress Notes (Signed)
Patient admit to inpatient rehab at 1500 from 4700.  Patient A &O x4, oriented to unit and provide admission information.  Patient verbalized understanding of fall prevention video and states she will call for assistance, call bell within reach.

## 2012-07-04 NOTE — Progress Notes (Signed)
Rehab admissions - Evaluated for possible admission.  I spoke with patient.  I then called her sister.  Both patient and sister are in agreement to inpatient rehab admission.  Patient is sitting up in chair today and tolerating activity well.  Bed available and will plan to admit to acute inpatient rehab today.  Call me for questions.  #[phone]

## 2012-07-04 NOTE — Progress Notes (Signed)
Subjective:  The patient is feeling well today.  She is anxious to transfer to inpatient rehabilitation when bed available. She denies any chest pain or shortness of breath. Telemetry shows normal sinus rhythm with occasional short runs of PAT which are asymptomatic.  Objective:  Vital Signs in the last 24 hours: Temp:  [97.9 F (36.6 C)-98.6 F (37 C)] 97.9 F (36.6 C) (12/23 0607) Pulse Rate:  [82-92] 84  (12/23 0607) Resp:  [19-20] 19  (12/23 0607) BP: (131-150)/(51-77) 131/57 mmHg (12/23 0607) SpO2:  [92 %-97 %] 94 % (12/23 0833) Weight:  [129 lb 8 oz (58.741 kg)] 129 lb 8 oz (58.741 kg) (12/23 0607)  Intake/Output from previous day: 12/22 0701 - 12/23 0700 In: 1203 [P.O.:1200; I.V.:3] Out: 2203 [Urine:2200; Stool:3] Intake/Output from this shift:       . antiseptic oral rinse  15 mL Mouth Rinse BID  . azithromycin  500 mg Oral Daily  . carvedilol  6.25 mg Oral BID WC  . dipyridamole-aspirin  1 capsule Oral BID  . enoxaparin (LOVENOX) injection  40 mg Subcutaneous Q24H  . insulin aspart  0-15 Units Subcutaneous TID WC  . insulin aspart  0-5 Units Subcutaneous QHS  . ipratropium  0.5 mg Nebulization Q6H  . levalbuterol  0.63 mg Nebulization Q6H  . losartan  50 mg Oral Daily  . metFORMIN  500 mg Oral BID WC  . methylPREDNISolone (SOLU-MEDROL) injection  40 mg Intravenous Q12H  . multivitamin with minerals  1 tablet Oral Daily  . pantoprazole  40 mg Oral QAC breakfast  . potassium chloride  40 mEq Oral Once  . sodium chloride  3 mL Intravenous Q12H      . sodium chloride Stopped (06/30/12 1104)  . sodium chloride Stopped (06/30/12 1038)    Physical Exam: The patient appears to be in no distress.  There is significant alopecia of a chronic nature.  Head and neck exam reveals that the pupils are equal and reactive.  The extraocular movements are full.  There is no scleral icterus.  Mouth and pharynx are benign.  No lymphadenopathy.  No carotid bruits.  The  jugular venous pressure is normal.  Thyroid is not enlarged or tender.  Chest is clear to percussion and auscultation.  No rales or rhonchi.  Expansion of the chest is symmetrical.  Heart reveals no abnormal lift or heave.  First and second heart sounds are normal.  There is no murmur gallop rub or click.  The abdomen is soft and nontender.  Bowel sounds are normoactive.  There is no hepatosplenomegaly or mass.  There are no abdominal bruits.  Extremities reveal no phlebitis or edema.  Pedal pulses are good.  There is no cyanosis or clubbing.  Neurologic exam reveals that she feels that her left side is weaker than her right secondary to old stroke.  Integument reveals no rash  Lab Results: No results found for this basename: WBC:2,HGB:2,PLT:2 in the last 72 hours  Basename 07/04/12 0512  NA --  K --  CL --  CO2 --  GLUCOSE --  BUN --  CREATININE 0.58   No results found for this basename: TROPONINI:2,CK,MB:2 in the last 72 hours Hepatic Function Panel No results found for this basename: PROT,ALBUMIN,AST,ALT,ALKPHOS,BILITOT,BILIDIR,IBILI in the last 72 hours No results found for this basename: CHOL in the last 72 hours No results found for this basename: PROTIME in the last 72 hours  Imaging: Imaging results have been reviewed  Cardiac Studies:  Assessment/Plan:  Patient Active Hospital Problem List: Acute on chronic systolic CHF (congestive heart failure) (06/27/2012)   Assessment: Patient appears to be euvolemic at the present time    Plan: Continue off diuretics.  Continue beta blocker and ARB  Acute respiratory failure (06/28/2012)   Assessment: No dyspnea at present    Plan: Continue current meds per pulmonary  Diabetes mellitus without complication ()   Assessment: Stable    Plan: Continue sliding scale insulin and metformin.  Renal function stable  NICM (nonischemic cardiomyopathy) (06/28/2012)   Assessment: Improved    Plan: We'll increase carvedilol to 12.5  mg twice a day  Hypotension (06/28/2012)   Assessment: Resolved   Non-ST elevation myocardial infarction (NSTEMI), initial care episode - Type 2 secondary to respiratory issues (06/28/2012)   Assessment: Stress-induced cardiomyopathy    Plan: Continue current cardiac meds    Plan: Okay to transfer to inpatient rehabilitation today if bed available.   LOS: 8 days    Cassell Clement 07/04/2012, 8:38 AM

## 2012-07-04 NOTE — Plan of Care (Signed)
Overall Plan of Care 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  Functional Problem List  Patient demonstrates impairments in the following areas: Balance, Bladder, Bowel, Edema, Endurance, Medication Management, Motor, Pain, Safety and Skin Integrity  Basic ADL's: bathing, dressing and toileting Advanced ADL's: not applicable  Transfers:  bed to chair, toilet, tub/shower and car Locomotion:  ambulation and stairs  Additional Impairments:  Functional use of upper extremity  Anticipated Outcomes Item Anticipated Outcome  Eating/Swallowing  independent  Basic self-care  Mod I with self care, supervision ADL transfer to toilet and tub bench  Tolieting  Mod I, Supervision with transfer  Bowel/Bladder  Remain continent with timed toileting   Transfers    Locomotion    Communication    Cognition    Pain  2 or less on scale 0-10  Safety/Judgment    Other     Therapy Plan: PT Intensity: Minimum of 1-2 x/day ,45 to 90 minutes PT Frequency: 5 out of 7 days PT Duration Estimated Length of Stay: 5 to 7 days OT Intensity: Minimum of 1-2 x/day, 45 to 90 minutes OT Frequency: 5 out of 7 days OT Duration/Estimated Length of Stay: 5-7 days      Team Interventions: Item RN PT OT SLP SW TR Other  Self Care/Advanced ADL Retraining  x x      Neuromuscular Re-Education  x x      Therapeutic Activities  x x      UE/LE Strength Training/ROM  x x      UE/LE Coordination Activities  x x      Visual/Perceptual Remediation/Compensation         DME/Adaptive Equipment Instruction  x x      Therapeutic Exercise  x x      Balance/Vestibular Training  x x      Patient/Family Education x x x      Cognitive Remediation/Compensation         Functional Mobility Training  x x       Ambulation/Gait Training  x       Arts administrator         Bladder Management x        Bowel Management x        Disease Management/Prevention         Pain Management x        Medication Management x        Skin Care/Wound Management x        Splinting/Orthotics         Discharge Planning  x x      Psychosocial Support                                Team Discharge Planning: Destination: PT- home ,OT- None ,  SLP-  Projected Follow-up: PT-Other (comment) (Prosthetics consult to adjust brace), OT-   , SLP-  Projected Equipment Needs: PT- (patient has cane and rolling walker), OT-none  ,  SLP-  Patient/family involved in discharge planning: PT- Patient,  OT-Patient, SLP-   MD ELOS: 10 days Medical Rehab Prognosis:  Good Assessment: 71 year old female with chronic left 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 Developed non-ST elevation MI. Now requires 24 7 rehabilitation RN M.D. As well as CIR level PT OT    See Team Conference Notes for weekly updates to the plan of care

## 2012-07-04 NOTE — Discharge Summary (Signed)
CARDIOLOGY DISCHARGE SUMMARY   Patient ID: Christine Durham MRN: [id number] DOB/AGE: March 31, 1941 71 y.o.  Admit date: 06/26/2012 Discharge date: 07/04/2012  Primary Discharge Diagnosis:    *Acute on chronic systolic CHF (congestive heart failure)/Acute respiratory failure Secondary Discharge Diagnosis:   NICM (nonischemic cardiomyopathy)  Hypotension  Diabetes mellitus without complication  Non-ST elevation myocardial infarction (NSTEMI), initial care episode - Type 2 secondary to respiratory issues  Tobacco abuse Asthma COPD Deconditioning  Consults: CCM, diabetes coordinator, nutrition, physical medicine and rehabilitation  Procedures:  Intubation and mechanical ventilation, Left Heart Cath, Selective Coronary Angiography, LV angiography, insertion of a central venous catheter   Hospital Course: Christine Durham is a 71 year old female with no previous cardiac issues. She went to [facility] emergency room on 06/26/2012 for increasing shortness of breath. Her ECG showed anterolateral ST elevation occurred STEMI was called. She was in significant respiratory distress upon presentation, not responding to BiPAP and nebulizers. She was intubated and taken to the Cath Lab.  Cardiac catheterization results are below but she had no significant coronary artery disease. However, she had a significant nonischemic cardiomyopathy with an EF of 25%. Medical therapy was recommended.  Critical care medicine was consulted to manage her ventilator and respiratory status. She had blood cultures drawn at [facility] that were positive for gram-positive cocci, coag negative staph. Antibiotics were initiated. Nebulizers as well as steroids were managed by critical care medicine. On 07/01/2012, she was extubated. She will complete her antibiotics today and is to be discharged on nebulizers and her plus a when necessary inhaler. She has done well since extubation and will follow up with primary care after  discharge.  Medications for her nonischemic cardiomyopathy were initiated. She was diuresed as needed but is not being discharged on Lasix. A beta blocker was started and has been uptitrated as her blood pressure will allow. She was also started on ARB. She had been on Aggrenox prior to admission and this was continued. She was on sliding scale insulin to help manage her blood sugars which were elevated because of the steroids and she is to followup with primary care for management of her diabetes. Her Glucophage was held initially after her heart catheterization but was restarted and she is to be discharged on that.  She was felt to have significant deconditioning because of her illness. A physical medicine and rehabilitation consult was called. She was seen by Dr. Hermelinda Medicus and by the rehabilitation admissions coordinator. She was seen by physical therapy and felt appropriate for inpatient rehabilitation.  By 07/04/2012, she had made some progress but was still unable to ambulate without assistance. She was evaluated by Dr. Patty Sermons and considered stable for discharge to inpatient rehabilitation, then to follow up in the [facility] office as well as with primary care as an outpatient. She will need to use daily weights to follow her volume status and be on a low sodium diabetic diet.  Labs:   Lab Results  Component Value Date   WBC 11.7* 07/01/2012   HGB 10.0* 07/01/2012   HCT 29.9* 07/01/2012   MCV 89.5 07/01/2012   PLT 277 07/01/2012     Lab 07/04/12 0512 07/01/12 0535 06/27/12 2315  NA -- 139 --  K -- 4.1 --  CL -- 103 --  CO2 -- 28 --  BUN -- 11 --  CREATININE 0.58 -- --  CALCIUM -- 9.0 --  PROT -- -- 5.2*  BILITOT -- -- 0.1*  ALKPHOS -- -- 77  ALT -- -- 11  AST -- -- 15  GLUCOSE -- 245* --   Lab Results  Component Value Date   TROPONINI 1.64* 06/27/2012   Pro B Natriuretic peptide (BNP)  Date/Time Value Range Status  06/26/2012 11:20 PM 150.2* 0 - 125 pg/mL Final       Radiology: US Renal 06/27/2012  *RADIOLOGY REPORT*  Clinical Data: Acute renal failure.  Diabetes.  Rule out hydronephrosis.  RENAL/URINARY TRACT ULTRASOUND COMPLETE  Comparison:  None.  Findings:  Right Kidney:  The right kidney is isoechoic to the index organ, the liver.  It is somewhat small measuring 9.8 cm.  No focal lesions are present.  Left Kidney:  The left kidney is isoechoic to the index organ, the spleen.  There is no hydronephrosis.  No stones or mass lesions are present.  The maximal length is 10.0 cm.  Bladder:  A Foley catheter is in place.  IMPRESSION:  1.  Somewhat small hyperechoic kidneys bilaterally, compatible with chronic medical renal disease. 2.  No mass lesion or evidence for obstruction.   Original Report Authenticated By: Marin Roberts, M.D.    Dg Chest Port 1 View 06/30/2012  *RADIOLOGY REPORT*  Clinical Data: Shortness of breath, post extubation  PORTABLE CHEST - 1 VIEW  Comparison: 06/29/2012  Findings: Endotracheal tube has been removed.  No diagnostic pneumothorax.  The cardiomediastinal silhouette is stable. Persistent right basilar atelectasis or infiltrate.  Left basilar atelectasis.  Stable left subclavian central line position with tip in the innominate vein.  No pulmonary edema.  IMPRESSION: Endotracheal tube has been removed.  No diagnostic pneumothorax. No pulmonary edema.  Right basilar atelectasis or infiltrate.  Left basilar atelectasis.  Stable left subclavian central line position.   Original Report Authenticated By: Natasha Mead, M.D.    Dg Chest Port 1 View 06/29/2012  *RADIOLOGY REPORT*  Clinical Data: Shortness of breath.  Endotracheal tube position.  PORTABLE CHEST - 1 VIEW  Comparison: 06/28/2012  Findings: 0550 hours.  Endotracheal tube tip is 3.9 cm above the base of the carina. The NG tube passes into the stomach although the distal tip position is not included on the film.  The tip of the left subclavian central line has pulled back in the interval  and is now positioned at the innominate vein confluence.  Cardiopericardial silhouette is stable.  There is bibasilar collapse / consolidation, left greater than right.  Probable tiny bilateral pleural effusions. Telemetry leads overlie the chest.  IMPRESSION: The left subclavian central line tip has pulled back in the interval and is now at the level of the innominate vein confluence.  Persistent bibasilar collapse / consolidation.   Original Report Authenticated By: Kennith Center, M.D.    Dg Chest Port 1 View 06/28/2012  *RADIOLOGY REPORT*  Clinical Data: Shortness of breath.  Assess endotracheal tube position.  PORTABLE CHEST - 1 VIEW  Comparison: 06/27/2012.  Findings: The support apparatus is stable.  Low lung volumes with vascular crowding and bibasilar atelectasis.  No large pleural effusion or pulmonary edema.  IMPRESSION:  1.  Stable support apparatus. 2.  Lower lung volumes with vascular crowding and bibasilar atelectasis.   Original Report Authenticated By: Rudie Meyer, M.D.    Dg Chest Port 1 View 06/27/2012  *RADIOLOGY REPORT*  Clinical Data: Central line placement.  PORTABLE CHEST - 1 VIEW  Comparison: Chest x-ray 06/27/2012.  Findings: An endotracheal tube is in place with tip 3.4 cm above the carina. There is a left-sided subclavian central venous catheter with tip terminating in the  distal superior vena cava. A nasogastric tube is seen extending into the stomach, however, the tip of the nasogastric tube extends below the lower margin of the image.  Lung volumes are slightly low.  Linear bibasilar opacities are favored to represent subsegmental atelectasis.  No definite pleural effusions.  Pulmonary vascular crowding related to low lung volumes, without frank pulmonary edema.  Heart size is normal. Mediastinal contours are unremarkable.  Atherosclerosis of the thoracic aorta.  IMPRESSION: 1.  Support apparatus, as above. 2.  Low lung volumes with probable bibasilar subsegmental atelectasis.    Original Report Authenticated By: Trudie Reed, M.D.    Dg Chest Port 1 View 06/27/2012  *RADIOLOGY REPORT*  Clinical Data: Check ET position  PORTABLE CHEST - 1 VIEW  Comparison: 06/26/2012  Findings: Endotracheal tube tip 3.7 cm proximal to the carina. Bibasilar opacities.  Interstitial coarsening.  Right hilar prominence.  No pneumothorax.  No definite pleural effusion. Cardiac contour within normal limits. No interval osseous change.  IMPRESSION: Endotracheal tube tip 3.7 cm proximal to the carina.  Bibasilar opacities; atelectasis, aspiration, or infiltrate.  Right hilar prominence is nonspecific, may reflect vascular congestion.  Recommend attention at two-view follow-up when the patient is clinically able.   Original Report Authenticated By: Jearld Lesch, M.D.    Dg Chest Port 1 View 06/26/2012  *RADIOLOGY REPORT*  Clinical Data: Shortness of breath.  PORTABLE CHEST - 1 VIEW  Comparison: None.  Findings: Adequately inflated without focal consolidation or effusion.  There is mild prominence of the perihilar markings which may be due to minimal vascular congestion.  Cardiomediastinal silhouette is within normal.  There is minimal spondylosis of the spine.  IMPRESSION: Suggestion of minimal vascular congestion.   Original Report Authenticated By: Elberta Fortis, M.D.     Cardiac Cath: 06/27/2012 Left Main: Normal  Left Anterior Descending (LAD): Normal in size with tortuosity in the midsegment with a 40% stenosis in that area. Otherwise no evidence of obstructive disease.  1st diagonal (D1): Large in size with minor irregularities.  2nd diagonal (D2): Very small in size.  3rd diagonal (D3): Very small in size.  Circumflex (LCx): Normal in size and nondominant. The vessel has minor irregularities.  1st obtuse marginal: High takeoff and small in size with minor irregularities.  2nd obtuse marginal: Normal in size with no significant disease.  3rd obtuse marginal: Medium in size with no  significant disease.  Right Coronary Artery: Normal in size and dominant. There is 20% proximal stenosis. Otherwise no obstructive disease.  posterior descending artery: Medium in size with minor irregularities.  posterior lateral branch: PL1 is small. PL 2 is normal in size with no significant disease. Left ventriculography: Left ventricular systolic function is severely , LVEF is estimated at 25% %, there is no significant mitral regurgitation . There is a tiny cyst of the mid distal anterior wall, apex and mid to distal inferior wall with hypercontractility of the basal segments. This appearance is highly suggestive of stress-induced cardiomyopathy.  Final Conclusions:  1. No evidence of obstructive coronary artery disease.  2. Moderately elevated left ventricular end-diastolic pressure.  3. 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  EKG:  28-Jun-2012 09:10:15 [facility] ROUTINE RECORD Normal sinus rhythm Left axis deviation Low voltage QRS Inferior infarct , age undetermined Cannot rule out Anterior infarct , age undetermined T wave abnormality, consider lateral ischemia Prolonged QT Abnormal ECG 16mm/s 56mm/mV 100Hz  8.0.1 12SL 241 HD CID: 1 Referred by: Orson Slick  Vent. rate 81 BPM PR interval 158 ms QRS duration 70 ms QT/QTc 454/527 ms P-R-T axes 77 -31 245  FOLLOW UP PLANS AND APPOINTMENTS No Known Allergies   Medication List     As of 07/04/2012 11:22 AM    TAKE these medications         albuterol 108 (90 BASE) MCG/ACT inhaler   Commonly known as: PROVENTIL HFA;VENTOLIN HFA   Inhale 2 puffs into the lungs every 6 (six) hours as needed for wheezing.      azithromycin 500 MG tablet   Commonly known as: ZITHROMAX   Take 1 tablet (500 mg total) by mouth daily.      carvedilol 12.5 MG tablet   Commonly known as: COREG   Take 1 tablet (12.5 mg total) by mouth 2 (two) times daily with a meal.       dipyridamole-aspirin 200-25 MG per 12 hr capsule   Commonly known as: AGGRENOX   Take 1 capsule by mouth 2 (two) times daily.      ipratropium 0.02 % nebulizer solution   Commonly known as: ATROVENT   Take 2.5 mLs (0.5 mg total) by nebulization every 6 (six) hours.      levalbuterol 0.63 MG/3ML nebulizer solution   Commonly known as: XOPENEX   Take 3 mLs (0.63 mg total) by nebulization every 6 (six) hours.      losartan 50 MG tablet   Commonly known as: COZAAR   Take 1 tablet (50 mg total) by mouth daily.      metFORMIN 500 MG tablet   Commonly known as: GLUCOPHAGE   Take 500 mg by mouth 2 (two) times daily with a meal.      predniSONE 10 MG tablet   Commonly known as: STERAPRED UNI-PAK   Take 4 tablets (40 mg total) by mouth daily. Take for 4 days and then stop.         Discharge Orders    Future Appointments: Provider: Department: Dept Phone: Center:   07/27/2012 1:30 PM Gaylord Shih, MD [facility] [phone] [id number]     Follow-up Information    Follow up with Valera Castle, MD. On 07/27/2012. (at 1:30 pm)    Contact information:   See at [facility] [address] [place], [place] [phone]           BRING ALL MEDICATIONS WITH YOU TO FOLLOW UP APPOINTMENTS  Time spent with patient to include physician time: 51 min Signed: Theodore Demark 07/04/2012, 11:22 AM Co-Sign MD

## 2012-07-04 NOTE — H&P (Signed)
CC:  Breathing problems and weakness Christine Durham is a 71 y.o. 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 Patient moved from [place] after most recent stroke 2011 the liver there sister. She was independent with a cane/walker prior to admission. Presented 06/27/2012 with acute onset of shortness of breath and wheezing. She denied any chest pain. Patient was noted to be hypoxic on presentation and tachycardic in the emergency department. EKG showed 2 mm of anterior lateral ST elevation with code NSTEMI was called. Patient with increased respiratory distress and was intubated. Cardiac catheterization completed showing no evidence of obstructive coronary artery disease she was severely reduced LV systolic function due to stress induced cardiomyopathy. Patient remained on Aggrenox therapy prior to admission as well as subcutaneous Lovenox added for DVT prophylaxis. She was extubated on 06/29/2012. Placed on Zithromax and Rocephin as well as Solu-Medrol 07/02/2012 after chest x-ray showed right basilar atelectasis versus infiltrate patient remained afebrile as later Rocephin was discontinued and remained on Zithromax alone. Her Solu-Medrol was to be slowly tapered. Physical therapy evaluation completed with recommendations for physical medicine rehabilitation consult to consider inpatient rehabilitation services. Patient was felt to be a candidate for inpatient rehabilitation services and was admitted for a comprehensive rehabilitation program    Review of Systems   Respiratory: Positive for cough and wheezing.   Gastrointestinal: Positive for constipation.   Musculoskeletal: Positive for myalgias.   Neurological: Positive for weakness.   All other systems reviewed and are negative      Past Medical History   Diagnosis  Date   .  Stroke  2011       with left-sided residual deficits   .   Diabetes mellitus without complication     .  Asthma     .  HTN (hypertension)     .  Tobacco abuse      Past Surgical History   Procedure  Date   .  Appendectomy      History reviewed. No pertinent family history. Social History: reports that she has been smoking.  She does not have any smokeless tobacco history on file. She reports that she does not drink alcohol or use illicit drugs. Allergies: No Known Allergies Medications Prior to Admission   Medication  Sig  Dispense  Refill   .  dipyridamole-aspirin (AGGRENOX) 200-25 MG per 12 hr capsule  Take 1 capsule by mouth 2 (two) times daily.         .  metFORMIN (GLUCOPHAGE) 500 MG tablet  Take 500 mg by mouth 2 (two) times daily with a meal.            Home: Home Living Lives With: Family (sister) Available Help at Discharge: Family;Available 24 hours/day (unsure how much sister can help her) Type of Home: House Home Access: Stairs to enter [employer] of Steps: 1 Home Layout: One level Bathroom Shower/Tub: Tub/shower unit Home Adaptive Equipment: Tub transfer bench;Walker - rolling;Straight cane    Functional History: Prior Function Driving: No Vocation: On disability   Functional Status:   Mobility: Bed Mobility Bed Mobility: Not assessed Supine to Sit: 4: Min guard;HOB elevated;With rails (20 degrees) Transfers Transfers: Sit to Stand;Stand to Sit Sit to Stand: From chair/3-in-1;From toilet;3: Mod assist Stand to Sit: 4: Min assist;To chair/3-in-1;To toilet Ambulation/Gait Ambulation/Gait Assistance: 4: Min assist Ambulation Distance (Feet): 100 Feet (100', 10', 10') Assistive device: Rolling walker Ambulation/Gait Assistance Details: Pt  ambulated in hall with RW with excessive trunk flexion and inversion LLE. Pt needed to use bathroom after sitting and assited to bathroom with assist for pericare due to incontinent stool in standing Gait Pattern: Step-through pattern;Decreased stride length;Decreased  stance time - left;Trunk flexed Gait velocity: decreased General Gait Details: ambulates on the lateral boder of left foot, appears AFO everts her ankle slightly (possibly loose?) Stairs: No Wheelchair Mobility Wheelchair Mobility: No   ADL:   Cognition: Cognition Arousal/Alertness: Awake/alert Orientation Level: Oriented X4 Cognition Overall Cognitive Status: Impaired (possibly at baseline) Area of Impairment: Safety/judgement;Attention Arousal/Alertness: Awake/alert Orientation Level: Appears intact for tasks assessed Behavior During Session: [facility] for tasks performed Current Attention Level: Selective;Alternating Attention - Other Comments: highly distractable needing redirection throughout session Safety/Judgement: Decreased awareness of need for assistance     Blood pressure 131/57, pulse 84, temperature 97.9 F (36.6 C), temperature source Oral, resp. rate 19, height 5\' 7"  (1.702 m), weight 58.741 kg (129 lb 8 oz), SpO2 94.00%.   Physical Exam  Vitals reviewed.   Constitutional: She is oriented to person, place, and time.   HENT:   Head: Normocephalic.   Eyes:  Pupils reactive to light   Neck: Neck supple. No thyromegaly present.   Cardiovascular: Normal rate and regular rhythm.   Pulmonary/Chest:  Decreased breath sounds at the bases but clear to auscultation, dyspneic with conversation   Abdominal: Soft. Bowel sounds are normal. She exhibits no distension.  Neurological: She is alert and oriented to person, place, and time.  She follows simple commands. Mild left facial droop and weakness. LUE is 3-In the deltoid, biceps, triceps, grip, LLE 3+In the hip flexors, knee extensors, ankle dorsiflexors and plantar flexors . Mild sensory loss to LT on the left. Right side grossly 4/5. Cognitively seems to display reasonable insight and awareness.  Cerebellar: Ataxia finger nose to finger testing on the left side  Skin: Skin is warm and dry.  Psychiatric: She has a  normal mood and affect    Results for orders placed during the hospital encounter of 06/26/12 (from the past 48 hour(s))   GLUCOSE, CAPILLARY     Status: Abnormal     Collection Time     07/02/12 11:10 AM       Component  Value  Range  Comment     Glucose-Capillary  168 (*)  70 - 99 mg/dL     GLUCOSE, CAPILLARY     Status: Abnormal     Collection Time     07/02/12  4:04 PM       Component  Value  Range  Comment     Glucose-Capillary  119 (*)  70 - 99 mg/dL     GLUCOSE, CAPILLARY     Status: Abnormal     Collection Time     07/02/12  9:06 PM       Component  Value  Range  Comment     Glucose-Capillary  121 (*)  70 - 99 mg/dL     GLUCOSE, CAPILLARY     Status: Abnormal     Collection Time     07/03/12  6:43 AM       Component  Value  Range  Comment     Glucose-Capillary  194 (*)  70 - 99 mg/dL     GLUCOSE, CAPILLARY     Status: Abnormal     Collection Time     07/03/12 11:03 AM       Component  Value  Range  Comment     Glucose-Capillary  191 (*)  70 - 99 mg/dL     GLUCOSE, CAPILLARY     Status: Abnormal     Collection Time     07/03/12  4:11 PM       Component  Value  Range  Comment     Glucose-Capillary  223 (*)  70 - 99 mg/dL     GLUCOSE, CAPILLARY     Status: Abnormal     Collection Time     07/03/12  8:53 PM       Component  Value  Range  Comment     Glucose-Capillary  141 (*)  70 - 99 mg/dL     CREATININE, SERUM     Status: Normal     Collection Time     07/04/12  5:12 AM       Component  Value  Range  Comment     Creatinine, Ser  0.58   0.50 - 1.10 mg/dL       GFR calc non Af Amer  >90   >90 mL/min       GFR calc Af Amer  >90   >90 mL/min     GLUCOSE, CAPILLARY     Status: Abnormal     Collection Time     07/04/12  6:01 AM       Component  Value  Range  Comment     Glucose-Capillary  166 (*)  70 - 99 mg/dL      No results found.   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 Patient is admitted to receive collaborative, interdisciplinary care between the physiatrist, rehab nursing staff, and therapy team. Patient's level of medical complexity and substantial therapy needs in context of that medical necessity cannot be provided at a lesser intensity of care such as a SNF. Patient has experienced substantial functional loss from his/her baseline which was documented above under the "Functional History" and "Functional Status" headings.  Judging by the patient's diagnosis, physical exam, and functional history, the patient has potential for functional progress which will result in measurable gains while on inpatient rehab.  These gains will be of substantial and practical use upon discharge  in facilitating mobility and self-care at the household level. Physiatrist will provide 24 hour management of medical needs as well as oversight of the therapy plan/treatment and provide guidance as appropriate regarding the interaction of the two. 24 hour rehab nursing will assist with bladder management, bowel management, safety, skin/wound care, disease management, medication administration and patient education  and help integrate therapy concepts, techniques,education, etc. PT will assess and treat for:  Pre-gait training, gait training, neuromuscular reeducation, endurance, safety, equipment.  Goals are: Supervision to modified independent level with mobility. OT will assess and treat for: ADLs, cognitive perceptual skills, safety, endurance, equipment, neuromuscular reeducation.   Goals are: Supervision to modified independent level ADLs. SLP will assess and treat for: Not applicable.  Goals are: Not applicable. Case Management and Social Worker will assess and treat for psychological issues and discharge planning. Team conference will be held weekly to assess progress toward goals and to determine barriers to  discharge. Patient will receive at least 3 hours of therapy per day at least 5 days per week. ELOS: 10 days      Prognosis:  good     Medical Problem List and Plan: 1.  Deconditioning after respiratory failure, myocardial infarction with history of prior right CVA 2. DVT Prophylaxis/Anticoagulation: Subcutaneous Lovenox. Monitor platelet counts any signs of bleeding 3. Neuropsych: This patient Is capable of making decisions on his/her own behalf. 4.NSTEMI. Cardiac catheterization showed no obstructive coronary artery disease. Followup per cardiology services 5. Hypertension. Coreg 12.5 mg twice a day, Cozaar 50 mg daily. Monitor with increased activity 6. Question CAP. Continue Zithromax as advised as well as Solu-Medrol taper. Nebulizers as directed. Check oxygen saturations every shift 7. COPD/tobacco abuse. Counseling. 8. Diabetes mellitus. Blood sugars ranging from 141-223 and monitored closely while on Solu-Medrol. Continue Glucophage 500 mg twice a day and sliding scale insulin. Check blood sugars a.c. and at bedtime 07/04/2012

## 2012-07-04 NOTE — PMR Pre-admission (Signed)
PMR Admission Coordinator Pre-Admission Assessment  Patient: Christine Durham is an 71 y.o., female MRN: [id number] DOB: 1940/10/21 Height: 5\' 7"  (170.2 cm) Weight: 58.741 kg (129 lb 8 oz) (c scale)              Insurance Information HMO:  No    PPO:       PCP:       IPA:       80/20:       OTHER:   PRIMARY:  Medicare A/B      Policy#: [id number]      Subscriber: Tarri Fuller CM Name:        Phone#:       Fax#:   Pre-Cert#:        Employer: Not employed Benefits:  Phone #:       Name: Armed forces technical officer. Date: 06/13/07     Deduct: $1184      Out of Pocket Max: none      Life Max: unlimited CIR: 100%      SNF: 100 days Outpatient: 80%     Co-Pay: 20% Home Health: 100%      Co-Pay: none DME: 80%     Co-Pay: 20% Providers: patient's choice  Emergency Contact Information Contact Information    Name Relation Home Work Mobile   [place] Sister [phone]  [phone]     Current Medical History  Patient Admitting Diagnosis: deconditioning after respiratory failure, MI//hx of prior right CVA   History of Present Illness: A  71 y.o. 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 Patient moved from [place] after most recent stroke 2011 the liver there sister. She was independent with a cane/walker prior to admission. Presented 06/27/2012 with acute onset of shortness of breath and wheezing. She denied any chest pain. Patient was noted to be hypoxic on presentation and tachycardic in the emergency department. EKG showed 2 mm of anterior lateral ST elevation with code NSTEMI was called. Patient with increased respiratory distress and was intubated. Cardiac catheterization completed showing no evidence of obstructive coronary artery disease she was severely reduced LV systolic function due to stress induced cardiomyopathy. Patient remained on Aggrenox therapy prior to admission as  well as subcutaneous Lovenox added for DVT prophylaxis. She was extubated on 06/29/2012. Physical therapy evaluation completed with recommendations for physical medicine rehabilitation consult to consider inpatient rehabilitation services.  Past Medical History  Past Medical History  Diagnosis Date  . 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 Diabetes mellitus without complication   . Asthma   . HTN (hypertension)   . Tobacco abuse     Family History  family history is not on file.  Prior Rehab/Hospitalizations: Had inpatient rehab in [place] for 2 months until 01/12.   Current Medications  Current facility-administered medications:0.9 %  sodium chloride infusion, , Intravenous, Continuous, Hurman Horn, MD;  0.9 %  sodium chloride infusion, 250 mL, Intravenous, PRN, Iran Ouch, MD;  0.9 %  sodium chloride infusion, , Intravenous, Continuous, Alyson Reedy, MD;  antiseptic oral rinse (BIOTENE) solution 15 mL, 15 mL, Mouth Rinse, BID, Alyson Reedy, MD, 15 mL at 07/04/12 0811 azithromycin (ZITHROMAX) tablet 500 mg, 500 mg, Oral, Daily, Iran Ouch, MD, 500 mg at 07/03/12 0853;  carvedilol (COREG) tablet 12.5 mg, 12.5 mg, Oral, BID WC, Cassell Clement, MD;  dipyridamole-aspirin (AGGRENOX) 200-25 MG per 12 hr capsule  1 capsule, 1 capsule, Oral, BID, Iran Ouch, MD, 1 capsule at 07/04/12 0810;  enoxaparin (LOVENOX) injection 40 mg, 40 mg, Subcutaneous, Q24H, Iran Ouch, MD, 40 mg at 07/03/12 1853 insulin aspart (novoLOG) injection 0-15 Units, 0-15 Units, Subcutaneous, TID WC, Lonia Farber, MD, 3 Units at 07/04/12 0625;  insulin aspart (novoLOG) injection 0-5 Units, 0-5 Units, Subcutaneous, QHS, Konstantin Zubelevitskiy, MD;  insulin aspart (novoLOG) injection 1-4 Units, 1-4 Units, Subcutaneous, Q4H PRN, Alyson Reedy, MD ipratropium (ATROVENT) nebulizer solution 0.5 mg, 0.5 mg, Nebulization, Q6H, Alyson Reedy, MD, 0.5 mg at 07/04/12 1610;   levalbuterol (XOPENEX) nebulizer solution 0.63 mg, 0.63 mg, Nebulization, Q6H, Alyson Reedy, MD, 0.63 mg at 07/04/12 9604;  losartan (COZAAR) tablet 50 mg, 50 mg, Oral, Daily, Iran Ouch, MD, 50 mg at 07/03/12 0854;  metFORMIN (GLUCOPHAGE) tablet 500 mg, 500 mg, Oral, BID WC, Vesta Mixer, MD, 500 mg at 07/04/12 5409 methylPREDNISolone sodium succinate (SOLU-MEDROL) 40 mg/mL injection 40 mg, 40 mg, Intravenous, Q12H, Alyson Reedy, MD, 40 mg at 07/03/12 2212;  multivitamin with minerals tablet 1 tablet, 1 tablet, Oral, Daily, Iran Ouch, MD, 1 tablet at 07/03/12 8119;  pantoprazole (PROTONIX) EC tablet 40 mg, 40 mg, Oral, QAC breakfast, Iran Ouch, MD, 40 mg at 07/04/12 0810 potassium chloride SA (K-DUR,KLOR-CON) CR tablet 40 mEq, 40 mEq, Oral, Once, Alyson Reedy, MD;  sodium chloride 0.9 % injection 3 mL, 3 mL, Intravenous, Q12H, Iran Ouch, MD, 3 mL at 07/03/12 2212;  sodium chloride 0.9 % injection 3 mL, 3 mL, Intravenous, PRN, Iran Ouch, MD  Patients Current Diet: Dysphagia  Precautions / Restrictions Precautions Precautions: Fall Precaution Comments: Left weakness and AFO Restrictions Weight Bearing Restrictions: No   Prior Activity Level Household: Was homebound, going to MD appts only.  Home Assistive Devices / Equipment Home Assistive Devices/Equipment: Brace (specify type) Home Adaptive Equipment: Tub transfer bench;Walker - rolling;Straight cane  Prior Functional Level Prior Function Level of Independence: Independent with assistive device(s) (?? per pt report, unsure how true this is) Needs Assistance: Transfers;Bathing Transfer Assistance: says she scoots on the bench into the tub, initially said sister helped but she bounced around a lot so unsure what the truth really is Driving: No Vocation: On disability  Current Functional Level Cognition  Arousal/Alertness: Awake/alert Overall Cognitive Status: Impaired (possibly at  baseline) Current Attention Level: Selective;Alternating Attention - Other Comments: highly distractable needing redirection throughout session Orientation Level: Oriented X4 Safety/Judgement: Decreased awareness of need for assistance    Extremity Assessment (includes Sensation/Coordination)  RUE ROM/Strength/Tone: WFL for tasks assessed (discomfort from IV making use of wrist difficult)  RLE ROM/Strength/Tone: WFL for tasks assessed    ADLs       Mobility  Bed Mobility: Not assessed Supine to Sit: 4: Min guard;HOB elevated;With rails (20 degrees)    Transfers  Transfers: Sit to Stand;Stand to Sit Sit to Stand: From chair/3-in-1;From toilet;3: Mod assist Stand to Sit: 4: Min assist;To chair/3-in-1;To toilet    Ambulation / Gait / Stairs / Wheelchair Mobility  Ambulation/Gait Ambulation/Gait Assistance: 4: Min assist Ambulation Distance (Feet): 100 Feet (100', 10', 10') Assistive device: Rolling walker Ambulation/Gait Assistance Details: Pt ambulated in hall with RW with excessive trunk flexion and inversion LLE. Pt needed to use bathroom after sitting and assited to bathroom with assist for pericare due to incontinent stool in standing Gait Pattern: Step-through pattern;Decreased stride length;Decreased stance time - left;Trunk flexed Gait velocity: decreased General Gait Details:  ambulates on the lateral boder of left foot, appears AFO everts her ankle slightly (possibly loose?) Stairs: No Wheelchair Mobility Wheelchair Mobility: No    Posture / Games developer Sitting - Balance Support: Bilateral upper extremity supported Static Sitting - Level of Assistance: 5: Stand by assistance;4: Min assist Static Sitting - Comment/# of Minutes: falls posteriorly when challenged, increased effort trying to put her shoe on  Static Standing Balance Static Standing - Balance Support: Bilateral upper extremity supported Static Standing - Level of Assistance: 4: Min  assist;5: Stand by assistance Static Standing - Comment/# of Minutes: cues for safety (pt trying to take hands off RW and turn around to see what the nurse is doing (pt incontinent with stool needing assist for pericare)    Special needs/care consideration BiPAP/CPAP No CPM NO Continuous Drip IV Yes, NS IV Dialysis NO        Days NO Life Vest NO Oxygen NO Special Bed NO Trach Size NO Wound Vac (area) NO      Location NO Skin WNL                              Location NO Bowel mgmt: Had BM on 12/20 Bladder mgmt: Voiding up on [facility] Diabetic mgmt Yes.  Sister states mild diabetic controlled with oral medication at home.     Previous Home Environment Living Arrangements: Other relatives Lives With: Family (sister) Available Help at Discharge: Family;Available 24 hours/day (unsure how much sister can help her) Type of Home: House Home Layout: One level Home Access: Stairs to enter [employer] of Steps: 1 Bathroom Shower/Tub: Tub/shower unit Home Care Services: No  Discharge Living Setting Plans for Discharge Living Setting: House;Lives with (comment) (Lives with her sister and a nephew.) Type of Home at Discharge: House Discharge Home Layout: One level Discharge Home Access: Stairs to enter Entrance Stairs-Number of Steps: 1 step entry Do you have any problems obtaining your medications?: Yes (Describe)  Social/Family/Support Systems Patient Roles: Other (Comment) (Has a sister.) Contact Information: Charlaine Seif-Webb - sister (h) [phone] (c) [phone] Anticipated Caregiver: Charlaine - sister Ability/Limitations of Caregiver: Sister is retired and can assist. Medical laboratory scientific officer: 24/7 Discharge Plan Discussed with Primary Caregiver: Yes Is Caregiver In Agreement with Plan?: Yes Does Caregiver/Family have Issues with Lodging/Transportation while Pt is in Rehab?: No    Goals/Additional Needs Patient/Family Goal for Rehab: PT/OT mod I/S goals, receiving ST  on acute. Expected length of stay: 10 days Cultural Considerations: Ep[employer] faith Dietary Needs: Dys 3, thin liquids Equipment Needs: TBD Pt/Family Agrees to Admission and willing to participate: Yes Program Orientation Provided & Reviewed with Pt/Caregiver Including Roles  & Responsibilities: Yes   Decrease burden of Care through IP rehab admission:  Not applicable  Possible need for SNF placement upon discharge: Not likely.  Sister was caregiver PTA and can assist.  Patient Condition: Please see physician update to information in consult dated 07/01/12.  Preadmission Screen Completed By:  Trish Mage, 07/04/2012 10:58 AM ______________________________________________________________________   Discussed status with Dr. Wynn Banker on 07/04/12 at 1105 and received telephone approval for admission today.  Admission Coordinator:  Trish Mage, time1105/Date12/23/13

## 2012-07-04 NOTE — Progress Notes (Signed)
Physical Therapy Treatment Patient Details Name: Christine Durham MRN: [id number] DOB: [birth year] Today's Date: 07/04/2012 Time: 7829-5621 PT Time Calculation (min): 27 min  PT Assessment / Plan / Recommendation Comments on Treatment Session  Pt admitted with SOB, VDRF, NSTEMI and progressing well with mobility. Pt on RA throughout with sats 95% and HR 97-100 with mobility. Pt unable to ascend step to enter home at this point but progressing with transfers and gait. Will continue to follow. Encouraged daily mobility with nursing assist.     Follow Up Recommendations  Home health PT;Supervision/Assistance - 24 hour     Does the patient have the potential to tolerate intense rehabilitation     Barriers to Discharge        Equipment Recommendations       Recommendations for Other Services    Frequency     Plan Discharge plan needs to be updated;Frequency remains appropriate    Precautions / Restrictions Precautions Precautions: Fall Precaution Comments: left weakness and AFO   Pertinent Vitals/Pain No pain    Mobility  Bed Mobility Bed Mobility: Not assessed Transfers Transfers: Sit to Stand;Stand to Sit Sit to Stand: 4: Min assist;From chair/3-in-1;With armrests Stand to Sit: 4: Min assist;To chair/3-in-1;With armrests Details for Transfer Assistance: cueing for sequence, hand placement, anterior translation and repeated over 3 trials to improve strength and transition, assist to control descent Ambulation/Gait Ambulation/Gait Assistance: 4: Min guard Ambulation Distance (Feet): 250 Feet Assistive device: Rolling walker Ambulation/Gait Assistance Details: cueing to step into RW and to extend turnk as pt tends to maintain self posterior to RW Gait Pattern: Step-through pattern;Decreased stance time - left;Trunk flexed Gait velocity: decreased Stairs: No (attempted one step backward with RW pt able to step up with )    Exercises General Exercises - Lower Extremity Long Arc  Quad: AROM;Both;Other reps (comment);Seated (25) Hip Flexion/Marching: AROM;Both;Other reps (comment);Seated (25)   PT Diagnosis:    PT Problem List:   PT Treatment Interventions:     PT Goals Acute Rehab PT Goals PT Goal: Sit to Stand - Progress: Progressing toward goal PT Goal: Stand to Sit - Progress: Progressing toward goal PT Goal: Ambulate - Progress: Progressing toward goal Pt will Go Up / Down Stairs: 1-2 stairs;with min assist;with least restrictive assistive device PT Goal: Up/Down Stairs - Progress: Goal set today PT Goal: Perform Home Exercise Program - Progress: Progressing toward goal  Visit Information  Last PT Received On: 07/04/12    Subjective Data  Subjective: "I thank you for getting me out"   Cognition  Overall Cognitive Status: Appears within functional limits for tasks assessed/performed Arousal/Alertness: Awake/alert Orientation Level: Appears intact for tasks assessed Behavior During Session: [facility] for tasks performed    Balance     End of Session PT - End of Session Equipment Utilized During Treatment: Left ankle foot orthosis Activity Tolerance: Patient tolerated treatment well Patient left: in chair;with call bell/phone within reach   GP     Delorse Lek 07/04/2012, 11:07 AM Delaney Meigs, PT [phone]

## 2012-07-05 ENCOUNTER — Inpatient Hospital Stay (HOSPITAL_COMMUNITY): Payer: Medicare Other | Admitting: Occupational Therapy

## 2012-07-05 ENCOUNTER — Inpatient Hospital Stay (HOSPITAL_COMMUNITY): Payer: Medicare Other

## 2012-07-05 DIAGNOSIS — R5381 Other malaise: Secondary | ICD-10-CM

## 2012-07-05 LAB — CBC WITH DIFFERENTIAL/PLATELET
Basophils Absolute: 0 10*3/uL (ref 0.0–0.1)
Eosinophils Relative: 0 % (ref 0–5)
HCT: 35.1 % — ABNORMAL LOW (ref 36.0–46.0)
Lymphocytes Relative: 19 % (ref 12–46)
MCV: 90 fL (ref 78.0–100.0)
Monocytes Absolute: 0.6 10*3/uL (ref 0.1–1.0)
Monocytes Relative: 4 % (ref 3–12)
RDW: 14 % (ref 11.5–15.5)
WBC: 12.5 10*3/uL — ABNORMAL HIGH (ref 4.0–10.5)

## 2012-07-05 LAB — COMPREHENSIVE METABOLIC PANEL
BUN: 11 mg/dL (ref 6–23)
CO2: 28 mEq/L (ref 19–32)
Calcium: 9.5 mg/dL (ref 8.4–10.5)
Creatinine, Ser: 0.51 mg/dL (ref 0.50–1.10)
GFR calc Af Amer: >90 mL/min (ref >90)
GFR calc non Af Amer: >90 mL/min (ref >90)
Glucose, Bld: 194 mg/dL — ABNORMAL HIGH (ref 70–99)

## 2012-07-05 LAB — URINALYSIS, ROUTINE W REFLEX MICROSCOPIC
Glucose, UA: >1000 mg/dL — AB
Hgb urine dipstick: NEGATIVE
Ketones, ur: NEGATIVE mg/dL
Protein, ur: NEGATIVE mg/dL
pH: 6 (ref 5.0–8.0)

## 2012-07-05 LAB — GLUCOSE, CAPILLARY: Glucose-Capillary: 131 mg/dL — ABNORMAL HIGH (ref 70–99)

## 2012-07-05 NOTE — Progress Notes (Signed)
Patient information reviewed and entered into eRehab system by Chau Savell, RN, CRRN, PPS Coordinator.  Information including medical coding and functional independence measure will be reviewed and updated through discharge.     Per nursing patient was given "Data Collection Information Summary for Patients in Inpatient Rehabilitation Facilities with attached "Privacy Act Statement-Health Care Records" upon admission.  

## 2012-07-05 NOTE — Progress Notes (Signed)
Social Work Patient ID: Christine Durham, female   DOB: [birth year], 71 y.o.   MRN: [id number]  Met briefly with patient this afternoon to review team conference.  Pt aware and agreeable with targeted d/c 12/27. Plans to contact sister to inform her of this (she will be picking her up).  Pt aware that Dossie Der, LCSW to follow up with her upon her return on Thursday to determine any d/c referral needs.  Ulas Zuercher, LCSW

## 2012-07-05 NOTE — Evaluation (Signed)
Occupational Therapy Assessment and Plan and Skilled Intervention  Patient Details  Name: Christine Durham MRN: [id number] Date of Birth: 1941/04/22  OT Diagnosis: hemiplegia affecting non-dominant side and muscle weakness (generalized) Rehab Potential: Rehab Potential: Good ELOS: 5-7 days   Today's Date: 07/05/2012 Time: 0900-1000 and 1130-1200 Time Calculation (min): 60 min and 30 min  Visit 1:  Pt seen for initial evaluation and BADL retraining at shower level with the use of a RW.  Pt is able to use her LUE as an active assist in all activities and raise her arm to 130 degrees. Pt only required close supervision with self care and min assist with mobility skills.  Visit 2:  Pt seen for LUE AROM exercise, tub bench transfers in tub room, toilet transfers, and dynamic standing balance at the sink with grooming activities.  Pt is highly motivated to increase her balance skills.  Problem List:  Patient Active Problem List  Diagnosis  . Acute on chronic systolic CHF (congestive heart failure)  . Acute respiratory failure  . Diabetes mellitus without complication  . NICM (nonischemic cardiomyopathy)  . Hypotension  . Non-ST elevation myocardial infarction (NSTEMI), initial care episode - Type 2 secondary to respiratory issues  . Tobacco abuse  . Asthma  . COPD (chronic obstructive pulmonary disease)  . Physical deconditioning  . CVA (cerebral infarction)    Past Medical History:  Past Medical History  Diagnosis Date  . 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 Diabetes mellitus without complication   . Asthma   . HTN (hypertension)   . Tobacco abuse    Past Surgical History:  Past Surgical History  Procedure Date  . Appendectomy     Assessment & Plan Clinical Impression:  Christine Durham is a 71 y.o. 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 Patient  moved from [place] after most recent stroke 2011 the liver there sister. She was independent with a cane/walker prior to admission. Presented 06/27/2012 with acute onset of shortness of breath and wheezing. She denied any chest pain. Patient was noted to be hypoxic on presentation and tachycardic in the emergency department. EKG showed 2 mm of anterior lateral ST elevation with code NSTEMI was called. Patient with increased respiratory distress and was intubated. Cardiac catheterization completed showing no evidence of obstructive coronary artery disease she was severely reduced LV systolic function due to stress induced cardiomyopathy. Patient remained on Aggrenox therapy prior to admission as well as subcutaneous Lovenox added for DVT prophylaxis. She was extubated on 06/29/2012. Placed on Zithromax and Rocephin as well as Solu-Medrol 07/02/2012 after chest x-ray showed right basilar atelectasis versus infiltrate patient remained afebrile as later Rocephin was discontinued and remained on Zithromax alone. Her Solu-Medrol was to be slowly tapered. Physical therapy evaluation completed with recommendations for physical medicine rehabilitation consult to consider inpatient rehabilitation services. Patient was felt to be a candidate for inpatient rehabilitation services and was admitted for a comprehensive rehabilitation program.  Patient transferred to CIR on 07/04/2012 .    Patient currently requires min with self-care mobility skills and supervision with self care secondary to muscle weakness, abnormal tone and decreased standing balance and hemiplegia.  Prior to hospitalization, patient could complete self care with mod I and mobility with supervision.  Patient will benefit from skilled intervention to increase independence with basic self-care skills prior to discharge home with care partner.  Anticipate patient will  require 24 hour supervision and intermittent supervision and no further OT follow  recommended.  OT - End of Session Activity Tolerance: Tolerates 10 - 20 min activity with multiple rests OT Assessment Rehab Potential: Good OT Plan OT Intensity: Minimum of 1-2 x/day, 45 to 90 minutes OT Frequency: 5 out of 7 days OT Duration/Estimated Length of Stay: 5-7 days OT Treatment/Interventions: Balance/vestibular training;Discharge planning;DME/adaptive equipment instruction;Functional mobility training;Neuromuscular re-education;Patient/family education;Therapeutic Exercise;Therapeutic Activities;UE/LE Coordination activities;UE/LE Strength taining/ROM;Self Care/advanced ADL retraining OT Recommendation Follow Up Recommendations: None  OT Evaluation Precautions/Restrictions  Precautions Precautions: Fall Precaution Comments: left AFO Restrictions Weight Bearing Restrictions: No  Pain Pain Assessment Pain Assessment: No/denies pain Home Living/Prior Functioning Home Living Lives With: Family Available Help at Discharge: Family;Available 24 hours/day Type of Home: House Home Access: Stairs to enter [employer] of Steps: 1 Home Layout: One level Bathroom Shower/Tub: Engineer, manufacturing systems: Standard Home Adaptive Equipment: Tub transfer bench;Walker - rolling;Straight cane;Bedside commode/3-in-1 Prior Function Level of Independence: Independent with basic ADLs;Needs assistance with tranfers;Needs assistance with gait Driving: No Vocation: On disability ADL Supervision   Vision/Perception  Vision - History Patient Visual Report: No change from baseline Perception Perception: Within Functional Limits Praxis Praxis: Intact  Cognition Overall Cognitive Status: Appears within functional limits for tasks assessed Sensation Sensation Light Touch: Impaired by gross assessment Stereognosis: Appears Intact Hot/Cold: Appears Intact Proprioception: Impaired by gross assessment (LLE) Coordination Gross Motor Movements are Fluid and  Coordinated: No Fine Motor Movements are Fluid and Coordinated: No (Left hand only) Motor  Motor Motor: Hemiplegia Motor - Skilled Clinical Observations: left hemiplegia from CVA in 2011 Mobility    min assist with transfers and ambulation with RW   Trunk/Postural Assessment  Cervical Assessment Cervical Assessment: Within Functional Limits Thoracic Assessment Thoracic Assessment: Within Functional Limits Postural Control Postural Control: Within Functional Limits  Balance Static Sitting Balance Static Sitting - Level of Assistance: 7: Independent Dynamic Sitting Balance Dynamic Sitting - Level of Assistance: 7: Independent Static Standing Balance Static Standing - Level of Assistance: 5: Stand by assistance Dynamic Standing Balance Dynamic Standing - Level of Assistance: 4: Min assist Extremity/Trunk Assessment RUE Assessment RUE Assessment: Within Functional Limits LUE Assessment LUE Assessment: Exceptions to [facility] (left hemiplegia from CVA in 2011, pt is able to actively use)  See FIM for current functional status Refer to Care Plan for Long Term Goals  Recommendations for other services: None  Discharge Criteria: Patient will be discharged from OT if patient refuses treatment 3 consecutive times without medical reason, if treatment goals not met, if there is a change in medical status, if patient makes no progress towards goals or if patient is discharged from hospital.  The above assessment, treatment plan, treatment alternatives and goals were discussed and mutually agreed upon: by patient  [facility] 07/05/2012, 10:32 AM

## 2012-07-05 NOTE — Evaluation (Addendum)
Physical Therapy Assessment and Plan  Patient Details  Name: Christine Durham MRN: [id number] Date of Birth: [birth year]  PT Diagnosis: Abnormality of gait, Difficulty walking and Hemiplegia non-dominant Rehab Potential: Good ELOS: 5 to 7 days   Today's Date: 07/05/2012 Time: 9562-1308 Time Calculation (min): 53 min  Problem List:  Patient Active Problem List  Diagnosis  . Acute on chronic systolic CHF (congestive heart failure)  . Acute respiratory failure  . Diabetes mellitus without complication  . NICM (nonischemic cardiomyopathy)  . Hypotension  . Non-ST elevation myocardial infarction (NSTEMI), initial care episode - Type 2 secondary to respiratory issues  . Tobacco abuse  . Asthma  . COPD (chronic obstructive pulmonary disease)  . Physical deconditioning  . CVA (cerebral infarction)    Past Medical History:  Past Medical History  Diagnosis Date  . 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 Diabetes mellitus without complication   . Asthma   . HTN (hypertension)   . Tobacco abuse    Past Surgical History:  Past Surgical History  Procedure Date  . Appendectomy     Assessment & Plan Clinical Impression: Patient is a 71 y.o. year old female with history of COPD, tobacco abuse, type 2 diabetes mellitus and previous stroke in 2011 with left-sided residual weakness. Patient moved from [place] after most recent stroke in 2011 to live with her sister. She was independent with a cane/walker prior to admission. Presented 06/27/2012 with acute onset of shortness of breath and wheezing. She denied any chest pain. Patient was noted to be hypoxic on presentation and tachycardic in the emergency department. EKG showed 2 mm of anterior lateral ST elevation with code NSTEMI was called. Patient with increased respiratory distress and was intubated. Cardiac catheterization completed showing no evidence of obstructive coronary artery disease she was severely reduced LV systolic  function due to stress induced cardiomyopathy. Patient remained on Aggrenox therapy prior to admission as well as subcutaneous Lovenox added for DVT prophylaxis. She was extubated on 06/29/2012. Placed on Zithromax and Rocephin as well as Solu-Medrol 07/02/2012 after chest x-ray showed right basilar atelectasis versus infiltrate patient remained afebrile as later Rocephin was discontinued and remained on Zithromax alone. Her Solu-Medrol was to be slowly tapered. Physical therapy evaluation completed with recommendations for physical medicine rehabilitation consult to consider inpatient rehabilitation services. Patient was felt to be a candidate for inpatient rehabilitation services and was admitted for a comprehensive rehabilitation program on 07/04/2012 .   Patient currently requires min with mobility secondary to muscle weakness and decreased cardiorespiratoy endurance.  Prior to hospitalization, patient was modified independent with mobility using a cane and rolling walker and lived with Other (Comment) (sister) in a one-level home.  Home access is 1Stairs to enter.  Patient will benefit from skilled PT intervention to maximize safe functional mobility and minimize fall risk for planned discharge home with intermittent supervision.  Anticipate patient will not need PT follow up at discharge.  PT - End of Session Activity Tolerance: Tolerates 30+ min activity with multiple rests PT Assessment Rehab Potential: Good Barriers to Discharge: None PT Plan PT Intensity: Minimum of 1-2 x/day ,45 to 90 minutes PT Frequency: 5 out of 7 days PT Duration Estimated Length of Stay: 5 to 7 days PT Treatment/Interventions: Ambulation/gait training;Balance/vestibular training;Discharge planning;Functional mobility training;Patient/family education;Stair training;Therapeutic Activities;Therapeutic Exercise;UE/LE Strength taining/ROM PT Recommendation Recommendations for Other Services: Other (comment) (Prosthetics  consult to adjust brace) Follow Up Recommendations: None Patient destination: Home Equipment Recommended:  (patient has  cane and rolling walker)  PT Evaluation Precautions/Restrictions Precautions Precautions: Fall Required Braces or Orthoses: Other Brace/Splint Other Brace/Splint: left articulating custom AFO Restrictions Weight Bearing Restrictions: No  Vital Signs Oxygen Therapy SpO2: 95 % O2 Device: None (Room air) (O2 sat dropped to 86 after amb; quickly returned above 90)  Pain Pain Assessment Pain Assessment: No/denies pain  Home Living/Prior Functioning Home Living Lives With: Other (Comment) (sister) Available Help at Discharge: Family Type of Home: House Home Access: Stairs to enter Secretary/administrator of Steps: 1 Entrance Stairs-Rails: None Home Layout: One level Prior Function Level of Independence: Independent with gait;Independent with transfers;Requires assistive device for independence Driving: No Vocation: On disability  Cognition Overall Cognitive Status: Impaired at baseline Arousal/Alertness: Awake/alert Orientation Level: Oriented X4 Attention: Alternating;Divided Alternating Attention: Impaired Alternating Attention Impairment: Functional complex (min verbal cue required) Divided Attention: Impaired Divided Attention Impairment: Functional complex (min verbal cue required) Memory: Impaired Memory Impairment: Storage deficit;Retrieval deficit (75% accurate ) Awareness: Appears intact Problem Solving: Appears intact Executive Function: Reasoning;Organizing;Initiating;Decision Making;Self Monitoring;Self Correcting Reasoning: Appears intact Organizing: Appears intact Decision Making: Appears intact Initiating: Appears intact Self Monitoring: Appears intact Self Correcting: Appears intact  Sensation Sensation Light Touch: Impaired by gross assessment Stereognosis: Not tested Hot/Cold: Not tested Proprioception: Impaired by gross  assessment Coordination Gross Motor Movements are Fluid and Coordinated: No Coordination and Movement Description: Patient able to touch thumb to each finger and perform heel to shin - increased time with left extremities Motor  Motor Motor: Hemiplegia Motor - Skilled Clinical Observations: left hemiplegia from stroke in 2011  Mobility Bed Mobility Bed Mobility: Sit to Supine Supine to Sit: 5: Supervision Sit to Supine: 5: Supervision Transfers Sit to Stand: 4: Min assist Stand to Sit: 4: Min Geneticist, molecular Ambulation/Gait Assistance: 4: Min Environmental consultant (Feet): 140 Feet Assistive device: Rolling walker Ambulation/Gait Assistance Details: Patient with hemiplegic gait from previous CVA. externally rotated at ankle and walks on the outside of her left foot   Gait Gait: Yes Gait Pattern: Impaired Gait Pattern: Step-through pattern;Decreased stance time - left;Left circumduction;Left hip hike;Left genu recurvatum;Trunk flexed;Decreased hip/knee flexion - left Stairs / Additional Locomotion Stairs: Yes Stairs Assistance: 4: Min assist Stairs Assistance Details: Verbal cues for gait pattern Stairs Assistance Details (indicate cue type and reason): cueing to use left LE first coming down stairs Stair Management Technique: Two rails Number of Stairs: 5  Height of Stairs: 6  Curb: 4: Min Paediatric nurse: No  Trunk/Postural Counsellor Sitting - Balance Support: No upper extremity supported;Feet supported Static Sitting - Level of Assistance: 7: Independent Dynamic Sitting Balance Dynamic Sitting - Level of Assistance: 7: Independent Static Standing Balance Static Standing - Level of Assistance: 5: Stand by assistance Dynamic Standing Balance Dynamic Standing - Level of Assistance: 4: Min assist Extremity Assessment      RLE Assessment RLE Assessment: Within Functional  Limits LLE Assessment LLE Assessment: Exceptions to [facility] (Patient has active movement left LE against gravity. )  See FIM for current functional status Refer to Care Plan for Long Term Goals  Recommendations for other services: Other: prosthetics to evaluate AFO  Discharge Criteria: Patient will be discharged from PT if patient refuses treatment 3 consecutive times without medical reason, if treatment goals not met, if there is a change in medical status, if patient makes no progress towards goals or if patient is discharged from hospital.  The above assessment, treatment plan,  treatment alternatives and goals were discussed and mutually agreed upon: by patient  Treatment began during session: Patient ambulated with single point cane and min assist 120 feet. Patient ambulated up and down 5 steps with railings and min assist and cueing to step down with left LE. Patient up and down curb with rolling walker and min steady assist. Patient exercised 10 minutes on Nustep for endurance training.   Arelia Longest M 07/05/2012, 2:49 PM

## 2012-07-05 NOTE — Progress Notes (Signed)
Patient ID: Christine Durham, female   DOB: 11/23/1940, 71 y.o.   MRN: [id number]  Subjective/Complaints: 71 y.o. 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 Patient moved from [place] after most recent stroke 2011 the liver there sister. She was independent with a cane/walker prior to admission. Presented 06/27/2012 with acute onset of shortness of breath and wheezing. She denied any chest pain. Patient was noted to be hypoxic on presentation and tachycardic in the emergency department. EKG showed 2 mm of anterior lateral ST elevation with code NSTEMI was called. Patient with increased respiratory distress and was intubated. Cardiac catheterization completed showing no evidence of obstructive coronary artery disease she was severely reduced LV systolic function due to stress induced cardiomyopathy. Patient remained on Aggrenox therapy prior to admission as well as subcutaneous Lovenox added for DVT prophylaxis. She was extubated on 06/29/2012. Placed on Zithromax and Rocephin as well as Solu-Medrol 07/02/2012 after chest x-ray showed right basilar atelectasis versus infiltrate patient remained afebrile as later Rocephin was discontinued and remained on Zithromax  Occ cough with H2O SLP checked swallow which was normal Review of Systems  Respiratory: Positive for cough. Negative for hemoptysis, sputum production and shortness of breath.   Neurological: Positive for weakness.  All other systems reviewed and are negative.     Objective: Vital Signs: Blood pressure 135/73, pulse 81, temperature 98.1 F (36.7 C), temperature source Oral, resp. rate 18, height 5\' 7"  (1.702 m), weight 55.7 kg (122 lb 12.7 oz), SpO2 92.00%. No results found. Results for orders placed during the hospital encounter of 07/04/12 (from the past 72 hour(s))  GLUCOSE, CAPILLARY     Status: Abnormal   Collection Time    07/04/12  4:23 PM      Component Value Range Comment   Glucose-Capillary 265 (*) 70 - 99 mg/dL    Comment 1 Notify RN     GLUCOSE, CAPILLARY     Status: Abnormal   Collection Time   07/04/12  8:13 PM      Component Value Range Comment   Glucose-Capillary 211 (*) 70 - 99 mg/dL    Comment 1 Notify RN     CBC WITH DIFFERENTIAL     Status: Abnormal   Collection Time   07/05/12  4:20 AM      Component Value Range Comment   WBC 12.5 (*) 4.0 - 10.5 K/uL    RBC 3.90  3.87 - 5.11 MIL/uL    Hemoglobin 11.7 (*) 12.0 - 15.0 g/dL    HCT 56.2 (*) 13.0 - 46.0 %    MCV 90.0  78.0 - 100.0 fL    MCH 30.0  26.0 - 34.0 pg    MCHC 33.3  30.0 - 36.0 g/dL    RDW 86.5  78.4 - 69.6 %    Platelets 341  150 - 400 K/uL    Neutrophils Relative 77  43 - 77 %    Neutro Abs 9.6 (*) 1.7 - 7.7 K/uL    Lymphocytes Relative 19  12 - 46 %    Lymphs Abs 2.3  0.7 - 4.0 K/uL    Monocytes Relative 4  3 - 12 %    Monocytes Absolute 0.6  0.1 - 1.0 K/uL    Eosinophils Relative 0  0 - 5 %    Eosinophils Absolute 0.0  0.0 - 0.7 K/uL    Basophils Relative 0  0 - 1 %  Basophils Absolute 0.0  0.0 - 0.1 K/uL   COMPREHENSIVE METABOLIC PANEL     Status: Abnormal   Collection Time   07/05/12  4:20 AM      Component Value Range Comment   Sodium 140  135 - 145 mEq/L    Potassium 4.7  3.5 - 5.1 mEq/L    Chloride 104  96 - 112 mEq/L    CO2 28  19 - 32 mEq/L    Glucose, Bld 194 (*) 70 - 99 mg/dL    BUN 11  6 - 23 mg/dL    Creatinine, Ser 1.61  0.50 - 1.10 mg/dL    Calcium 9.5  8.4 - 09.6 mg/dL    Total Protein 5.7 (*) 6.0 - 8.3 g/dL    Albumin 2.6 (*) 3.5 - 5.2 g/dL    AST 16  0 - 37 U/L    ALT 34  0 - 35 U/L    Alkaline Phosphatase 74  39 - 117 U/L    Total Bilirubin 0.3  0.3 - 1.2 mg/dL    GFR calc non Af Amer >90  >90 mL/min    GFR calc Af Amer >90  >90 mL/min   GLUCOSE, CAPILLARY     Status: Abnormal   Collection Time   07/05/12  7:50 AM      Component Value Range Comment   Glucose-Capillary 131 (*) 70 - 99  mg/dL    Comment 1 Notify RN        Constitutional: She is oriented to person, place, and time.  HENT:  Head: Normocephalic.  Eyes:  Pupils reactive to light  Neck: Neck supple. No thyromegaly present.  Cardiovascular: Normal rate and regular rhythm.  Pulmonary/Chest:  Decreased breath sounds at the bases but clear to auscultation, dyspneic with conversation  Abdominal: Soft. Bowel sounds are normal. She exhibits no distension.  Neurological: She is alert and oriented to person, place, and time.  She follows simple commands. Mild left facial droop and weakness. LUE is 3-In the deltoid, biceps, triceps, grip, LLE 3+In the hip flexors, knee extensors, ankle dorsiflexors and plantar flexors . Mild sensory loss to LT on the left. Right side grossly 4/5. Cognitively seems to display reasonable insight and awareness.  Cerebellar: Ataxia finger nose to finger testing on the left side  Skin: Skin is warm and dry.  Psychiatric: She has a normal mood and affect  Assessment/Plan: 1. Functional deficits secondary to Respiratory failure after MI and exacerbation of COPD which require 3+ hours per day of interdisciplinary therapy in a comprehensive inpatient rehab setting. Physiatrist is providing close team supervision and 24 hour management of active medical problems listed below. Physiatrist and rehab team continue to assess barriers to discharge/monitor patient progress toward functional and medical goals. FIM:                   Comprehension Comprehension Mode: Auditory Comprehension: 6-Follows complex conversation/direction: With extra time/assistive device  Expression Expression Mode: Verbal Expression: 6-Expresses complex ideas: With extra time/assistive device  Social Interaction Social Interaction: 6-Interacts appropriately with others with medication or extra time (anti-anxiety, antidepressant).  Problem Solving Problem Solving: 5-Solves complex 90% of the time/cues <  10% of the time  Memory Memory: 6-More than reasonable amt of time    Medical Problem List and Plan:  1. Deconditioning after respiratory failure, myocardial infarction with history of prior right CVA with L HP 2. DVT Prophylaxis/Anticoagulation: Subcutaneous Lovenox. Monitor platelet counts any signs of bleeding  3.  Neuropsych: This patient Is capable of making decisions on his/her own behalf.  4.NSTEMI. Cardiac catheterization showed no obstructive coronary artery disease. Followup per cardiology services  5. Hypertension. Coreg 12.5 mg twice a day, Cozaar 50 mg daily. Monitor with increased activity  6. Question CAP. Continue Zithromax as advised as well as Solu-Medrol taper. Nebulizers as directed. Check oxygen saturations every shift  7. COPD/tobacco abuse. Counseling. Solumedrol causing leukocytosis, afebrile 8. Diabetes mellitus. Blood sugars ranging from 141-223 and monitored closely while on Solu-Medrol. Continue Glucophage 500 mg twice a day   LOS (Days) 1 A FACE TO FACE EVALUATION WAS PERFORMED  Hamzeh Tall E 07/05/2012, 10:16 AM

## 2012-07-05 NOTE — Patient Care Conference (Signed)
Inpatient RehabilitationTeam Conference and Plan of Care Update Date: 07/05/2012   Time: 1:25 PM    Patient Name: Christine Durham      Medical Record Number: [id number]  Date of Birth: 14-May-1941 Sex: Female         Room/Bed: 4144/4144-01 Payor Info: Payor: MEDICARE  Plan: MEDICARE PART A AND B  Product Type: *No Product type*     Admitting Diagnosis: Decond  Admit Date/Time:  07/04/2012  3:06 PM Admission Comments: No comment available   Primary Diagnosis:  CVA (cerebral infarction) Principal Problem: CVA (cerebral infarction)  Patient Active Problem List   Diagnosis Date Noted  . COPD (chronic obstructive pulmonary disease) 07/04/2012  . Physical deconditioning 07/04/2012  . CVA (cerebral infarction) 07/04/2012  . Asthma   . Tobacco abuse 07/01/2012  . Acute respiratory failure 06/28/2012  . NICM (nonischemic cardiomyopathy) 06/28/2012  . Hypotension 06/28/2012  . Non-ST elevation myocardial infarction (NSTEMI), initial care episode - Type 2 secondary to respiratory issues 06/28/2012  . Diabetes mellitus without complication   . Acute on chronic systolic CHF (congestive heart failure) 06/27/2012    Expected Discharge Date: Expected Discharge Date: 07/08/12  Team Members Present: Social Worker Present: Amada Jupiter, LCSW Nurse Present: Other (comment) (Thaih Rcom, RN) PT Present: Other (comment) Clarisse Gouge Ripa, PT) OT Present: Mackie Pai, Felipa Eth, OT     Current Status/Progress Goal Weekly Team Focus  Medical   Stroke  2011   Causing chronic left hemiparesis, respiratory status is stable, no chest pain  Maintained medical stability during rehabilitation stay  Monitor cardiac and pulmonary function as therapy schedule increases   Bowel/Bladder   can be incontinent of bladder/ No bowel movement  cont of bowel and bladder  cont of bowel and bladder   Swallow/Nutrition/ Hydration   Pt consumes dys 3 diet with thin liquids with min verbal cues for aspiration  precautions with intermittent signs of aspriation.   Pt will consume regular diet with thin liquids with supervision for aspiration precautions with no signs of aspiraiton.        ADL's   supervision with self care, min assist with ambulation with RW within room  mod I with bathing, dressing, and toileting;  supervision with transfers  ADL retraining, functional mobility training, pt/ family education   Mobility   min assist with rolling walker in comtrolled environment  modified independent with ambulation; supervision car transfers  prosthetic consult to possibly revise brace; balance, ambulation, family education   Communication   WNL         Safety/Cognition/ Behavioral Observations  Min verbal cues required for alternating/divided attention, short term memory tasks.   Pt will demosntrate alternating/divided attention with supervision cues/ Utilize compensatory strategies for memory with supervision cues.       Pain   denied pain/ Acetaminophen 325-650 mg PRN  less or equal to 2  less or equal to 2   Skin   Small hematoma Right groin  free of new skin breakdown  free of new skin breakdown    Rehab Goals Patient on target to meet rehab goals: Yes *See Interdisciplinary Assessment and Plan and progress notes for long and short-term goals  Barriers to Discharge: Overall weakness in addition to chronic left hemiparesis    Possible Resolutions to Barriers:  Continued therapy program    Discharge Planning/Teaching Needs:  home with sister to assist as needed      Team Discussion:  New admit doing relatively well and anticipate reaching mod i goals  with short LOS - no concerns  Revisions to Treatment Plan:  None   Continued Need for Acute Rehabilitation Level of Care: The patient requires daily medical management by a physician with specialized training in physical medicine and rehabilitation for the following conditions: Daily direction of a multidisciplinary physical  rehabilitation program to ensure safe treatment while eliciting the highest outcome that is of practical value to the patient.: Yes Daily medical management of patient stability for increased activity during participation in an intensive rehabilitation regime.: Yes Daily analysis of laboratory values and/or radiology reports with any subsequent need for medication adjustment of medical intervention for : Neurological problems;Other;Pulmonary problems;Cardiac problems  Christine Durham 07/05/2012, 2:04 PM

## 2012-07-05 NOTE — Evaluation (Signed)
Speech Language Pathology Assessment and Plan  Patient Details  Name: Christine Durham MRN: [id number] Date of Birth: 02-12-1941  SLP Diagnosis: Cognitive Impairments;Dysphagia  Rehab Potential: Good ELOS: 10   Today's Date: 07/05/2012 Time: 7829-5621 Time Calculation (min): 53 min  Problem List:  Patient Active Problem List  Diagnosis  . Acute on chronic systolic CHF (congestive heart failure)  . Acute respiratory failure  . Diabetes mellitus without complication  . NICM (nonischemic cardiomyopathy)  . Hypotension  . Non-ST elevation myocardial infarction (NSTEMI), initial care episode - Type 2 secondary to respiratory issues  . Tobacco abuse  . Asthma  . COPD (chronic obstructive pulmonary disease)  . Physical deconditioning  . CVA (cerebral infarction)   Past Medical History:  Past Medical History  Diagnosis Date  . 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 Diabetes mellitus without complication   . Asthma   . HTN (hypertension)   . Tobacco abuse    Past Surgical History:  Past Surgical History  Procedure Date  . Appendectomy     Assessment / Plan / Recommendation Clinical Impression  Christine Durham is a 71 y.o. 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 Patient moved from [place] after most recent stroke 2011 the liver there sister. She was independent with a cane/walker prior to admission. Presented 06/27/2012 with acute onset of shortness of breath and wheezing. She denied any chest pain. Patient was noted to be hypoxic on presentation and tachycardic in the emergency department. EKG showed 2 mm of anterior lateral ST elevation with code NSTEMI was called. Patient with increased respiratory distress and was intubated. Cardiac catheterization completed showing no evidence of obstructive coronary artery disease she was severely reduced LV systolic  function due to stress induced cardiomyopathy. Patient remained on Aggrenox therapy prior to admission as well as subcutaneous Lovenox added for DVT prophylaxis. She was extubated on 06/29/2012. Placed on Zithromax and Rocephin as well as Solu-Medrol 07/02/2012 after chest x-ray showed right basilar atelectasis versus infiltrate patient remained afebrile as later Rocephin was discontinued and remained on Zithromax alone. Her Solu-Medrol was to be slowly tapered. Physical therapy evaluation completed with recommendations for physical medicine rehabilitation consult to consider inpatient rehabilitation services. Patient was felt to be a candidate for inpatient rehabilitation services and was admitted for a comprehensive rehabilitation program. Today Pt presents with mild baseline cognitive deficits requiring min assist from family prior to admission (finances, filling perscriptions, recalling appts). Pt demonstrates ability to complete compex functional problem solving and demonstrates emergent awareness of deficits. Pt does require min verbal cues for short term memory and for high level attention (alternating and divided). Speech and language are WNL though pt does appear to have some mildly decreased inhibition with verbal language/pragmatics which she reports is her baseline. Swallow evaluation reveals  immediate signs of aspiration with large straw sips exclusively. With min verbal cues for small cup sisps no immediate cough over multiple trials. Pt would benefit from continued speech therapy to reinforce aspiration precautions and monitor for diet tolerance and address higher level cognition to decrease burden of care on family during her inpatient rehab stay.     SLP Assessment  Patient will need skilled Speech Lanaguage Pathology Services during CIR admission    Recommendations  Diet Recommendations: Dysphagia 3 (Mechanical Soft);Thin liquid Liquid Administration via: Cup;No straw Medication  Administration: Whole meds with liquid Supervision: Intermittent supervision to cue for  compensatory strategies Compensations: Slow rate;Small sips/bites Postural Changes and/or Swallow Maneuvers: Out of bed for meals;Seated upright 90 degrees Oral Care Recommendations: Oral care BID Patient destination: Home Follow up Recommendations: None    SLP Frequency 3 out of 7 days   SLP Treatment/Interventions Cognitive remediation/compensation;Cueing hierarchy    Pain Pain Assessment Pain Assessment: No/denies pain Prior Functioning Cognitive/Linguistic Baseline: Baseline deficits Baseline deficit details: higher level cognitiion from previous Right CVA Type of Home: House Lives With: Other (Comment) (sister) Available Help at Discharge: Family Vocation: On disability  Short Term Goals: Week 1: SLP Short Term Goal 1 (Week 1): Pt will demonstrate divided attention during functional problem solving task with supervision cues.  SLP Short Term Goal 2 (Week 1): Pt will utilize compensatory strategies for memory during functional task with supervision cues.  SLP Short Term Goal 3 (Week 1): Pt will follow aspiration precautions in 80% of trials with supervision cues.   See FIM for current functional status Refer to Care Plan for Long Term Goals  Recommendations for other services: None  Discharge Criteria: Patient will be discharged from SLP if patient refuses treatment 3 consecutive times without medical reason, if treatment goals not met, if there is a change in medical status, if patient makes no progress towards goals or if patient is discharged from hospital.  The above assessment, treatment plan, treatment alternatives and goals were discussed and mutually agreed upon: by patient  Christine Durham, Riley Nearing 07/05/2012, 1:04 PM

## 2012-07-06 LAB — GLUCOSE, CAPILLARY
Glucose-Capillary: 146 mg/dL — ABNORMAL HIGH (ref 70–99)
Glucose-Capillary: 254 mg/dL — ABNORMAL HIGH (ref 70–99)
Glucose-Capillary: 159 mg/dL — ABNORMAL HIGH (ref 70–99)

## 2012-07-06 MED ORDER — PREDNISONE 20 MG PO TABS
40.0000 mg | ORAL_TABLET | Freq: Every day | ORAL | Status: DC
  Administered 2012-07-06 – 2012-07-08 (×3): 40 mg via ORAL
  Filled 2012-07-06 (×4): qty 2

## 2012-07-06 NOTE — Progress Notes (Signed)
Patient ID: Christine Durham, female   DOB: Jul 11, 1941, 71 y.o.   MRN: [id number]  Subjective/Complaints: 71 y.o. 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 Patient moved from [place] after most recent stroke 2011 the liver there sister. She was independent with a cane/walker prior to admission. Presented 06/27/2012 with acute onset of shortness of breath and wheezing. She denied any chest pain. Patient was noted to be hypoxic on presentation and tachycardic in the emergency department. EKG showed 2 mm of anterior lateral ST elevation with code NSTEMI was called. Patient with increased respiratory distress and was intubated. Cardiac catheterization completed showing no evidence of obstructive coronary artery disease she was severely reduced LV systolic function due to stress induced cardiomyopathy. Patient remained on Aggrenox therapy prior to admission as well as subcutaneous Lovenox added for DVT prophylaxis. She was extubated on 06/29/2012. Placed on Zithromax and Rocephin as well as Solu-Medrol 07/02/2012 after chest x-ray showed right basilar atelectasis versus infiltrate patient remained afebrile as later Rocephin was discontinued and remained on Zithromax  Occ cough with H2O SLP checked swallow which was normal Review of Systems  Respiratory: Positive for cough. Negative for hemoptysis, sputum production and shortness of breath.   Neurological: Positive for weakness.  All other systems reviewed and are negative.     Objective: Vital Signs: Blood pressure 132/74, pulse 82, temperature 98.2 F (36.8 C), temperature source Oral, resp. rate 19, height 5\' 7"  (1.702 m), weight 55.7 kg (122 lb 12.7 oz), SpO2 98.00%. No results found. Results for orders placed during the hospital encounter of 07/04/12 (from the past 72 hour(s))  GLUCOSE, CAPILLARY     Status: Abnormal   Collection Time    07/04/12  4:23 PM      Component Value Range Comment   Glucose-Capillary 265 (*) 70 - 99 mg/dL    Comment 1 Notify RN     GLUCOSE, CAPILLARY     Status: Abnormal   Collection Time   07/04/12  8:13 PM      Component Value Range Comment   Glucose-Capillary 211 (*) 70 - 99 mg/dL    Comment 1 Notify RN     CBC WITH DIFFERENTIAL     Status: Abnormal   Collection Time   07/05/12  4:20 AM      Component Value Range Comment   WBC 12.5 (*) 4.0 - 10.5 K/uL    RBC 3.90  3.87 - 5.11 MIL/uL    Hemoglobin 11.7 (*) 12.0 - 15.0 g/dL    HCT 69.6 (*) 29.5 - 46.0 %    MCV 90.0  78.0 - 100.0 fL    MCH 30.0  26.0 - 34.0 pg    MCHC 33.3  30.0 - 36.0 g/dL    RDW 28.4  13.2 - 44.0 %    Platelets 341  150 - 400 K/uL    Neutrophils Relative 77  43 - 77 %    Neutro Abs 9.6 (*) 1.7 - 7.7 K/uL    Lymphocytes Relative 19  12 - 46 %    Lymphs Abs 2.3  0.7 - 4.0 K/uL    Monocytes Relative 4  3 - 12 %    Monocytes Absolute 0.6  0.1 - 1.0 K/uL    Eosinophils Relative 0  0 - 5 %    Eosinophils Absolute 0.0  0.0 - 0.7 K/uL    Basophils Relative 0  0 - 1 %  Basophils Absolute 0.0  0.0 - 0.1 K/uL   COMPREHENSIVE METABOLIC PANEL     Status: Abnormal   Collection Time   07/05/12  4:20 AM      Component Value Range Comment   Sodium 140  135 - 145 mEq/L    Potassium 4.7  3.5 - 5.1 mEq/L    Chloride 104  96 - 112 mEq/L    CO2 28  19 - 32 mEq/L    Glucose, Bld 194 (*) 70 - 99 mg/dL    BUN 11  6 - 23 mg/dL    Creatinine, Ser 1.61  0.50 - 1.10 mg/dL    Calcium 9.5  8.4 - 09.6 mg/dL    Total Protein 5.7 (*) 6.0 - 8.3 g/dL    Albumin 2.6 (*) 3.5 - 5.2 g/dL    AST 16  0 - 37 U/L    ALT 34  0 - 35 U/L    Alkaline Phosphatase 74  39 - 117 U/L    Total Bilirubin 0.3  0.3 - 1.2 mg/dL    GFR calc non Af Amer >90  >90 mL/min    GFR calc Af Amer >90  >90 mL/min   GLUCOSE, CAPILLARY     Status: Abnormal   Collection Time   07/05/12  7:50 AM      Component Value Range Comment   Glucose-Capillary 131 (*) 70 - 99  mg/dL    Comment 1 Notify RN     GLUCOSE, CAPILLARY     Status: Abnormal   Collection Time   07/05/12 11:55 AM      Component Value Range Comment   Glucose-Capillary 122 (*) 70 - 99 mg/dL   GLUCOSE, CAPILLARY     Status: Abnormal   Collection Time   07/05/12  4:39 PM      Component Value Range Comment   Glucose-Capillary 224 (*) 70 - 99 mg/dL    Comment 1 Notify RN     URINALYSIS, ROUTINE W REFLEX MICROSCOPIC     Status: Abnormal   Collection Time   07/05/12  5:00 PM      Component Value Range Comment   Color, Urine YELLOW  YELLOW    APPearance HAZY (*) CLEAR    Specific Gravity, Urine 1.027  1.005 - 1.030    pH 6.0  5.0 - 8.0    Glucose, UA >1000 (*) NEGATIVE mg/dL    Hgb urine dipstick NEGATIVE  NEGATIVE    Bilirubin Urine NEGATIVE  NEGATIVE    Ketones, ur NEGATIVE  NEGATIVE mg/dL    Protein, ur NEGATIVE  NEGATIVE mg/dL    Urobilinogen, UA 0.2  0.0 - 1.0 mg/dL    Nitrite NEGATIVE  NEGATIVE    Leukocytes, UA NEGATIVE  NEGATIVE   URINE MICROSCOPIC-ADD ON     Status: Abnormal   Collection Time   07/05/12  5:00 PM      Component Value Range Comment   Squamous Epithelial / LPF MANY (*) RARE    WBC, UA 0-2  <3 WBC/hpf    Urine-Other MUCOUS PRESENT     GLUCOSE, CAPILLARY     Status: Abnormal   Collection Time   07/05/12  9:07 PM      Component Value Range Comment   Glucose-Capillary 139 (*) 70 - 99 mg/dL   GLUCOSE, CAPILLARY     Status: Abnormal   Collection Time   07/06/12  7:17 AM      Component Value Range Comment   Glucose-Capillary 141 (*)  70 - 99 mg/dL      Constitutional: She is oriented to person, place, and time.  HENT:  Head: Normocephalic.  Eyes:  Pupils reactive to light  Neck: Neck supple. No thyromegaly present.  Cardiovascular: Normal rate and regular rhythm.  Pulmonary/Chest:  Decreased breath sounds at the bases but clear to auscultation, dyspneic with conversation  Abdominal: Soft. Bowel sounds are normal. She exhibits no distension.   Neurological: She is alert and oriented to person, place, and time.  She follows simple commands. Mild left facial droop and weakness. LUE is 3-In the deltoid, biceps, triceps, grip, LLE 3+In the hip flexors, knee extensors, ankle dorsiflexors and plantar flexors . Mild sensory loss to LT on the left. Right side grossly 4/5. Cognitively seems to display reasonable insight and awareness.  Cerebellar: Ataxia finger nose to finger testing on the left side  Skin: Skin is warm and dry.  Psychiatric: She has a normal mood and affect  Assessment/Plan: 1. Functional deficits secondary to Respiratory failure after MI and exacerbation of COPD which require 3+ hours per day of interdisciplinary therapy in a comprehensive inpatient rehab setting. Physiatrist is providing close team supervision and 24 hour management of active medical problems listed below. Physiatrist and rehab team continue to assess barriers to discharge/monitor patient progress toward functional and medical goals. FIM: FIM - Bathing Bathing Steps Patient Completed: Chest;Right Arm;Left Arm;Abdomen;Front perineal area;Right upper leg;Left upper leg;Right lower leg (including foot);Buttocks;Left lower leg (including foot) Bathing: 5: Supervision: Safety issues/verbal cues  FIM - Upper Body Dressing/Undressing Upper body dressing/undressing steps patient completed: Thread/unthread left sleeve of pullover shirt/dress;Thread/unthread right sleeve of pullover shirt/dresss;Put head through opening of pull over shirt/dress;Pull shirt over trunk Upper body dressing/undressing: 5: Supervision: Safety issues/verbal cues FIM - Lower Body Dressing/Undressing Lower body dressing/undressing steps patient completed: Thread/unthread right pants leg;Thread/unthread left pants leg;Pull pants up/down;Fasten/unfasten right shoe;Fasten/unfasten left shoe;Don/Doff left shoe;Don/Doff right shoe Lower body dressing/undressing: 5: Supervision: Safety  issues/verbal cues  FIM - Toileting Toileting steps completed by patient: Adjust clothing prior to toileting;Performs perineal hygiene;Adjust clothing after toileting Toileting: 5: Supervision: Safety issues/verbal cues  FIM - Archivist Transfers Assistive Devices: Art gallery manager Transfers: 4-To toilet/BSC: Min A (steadying Pt. > 75%);4-From toilet/BSC: Min A (steadying Pt. > 75%)  FIM - Bed/Chair Transfer Bed/Chair Transfer: 5: Supine > Sit: Supervision (verbal cues/safety issues);5: Sit > Supine: Supervision (verbal cues/safety issues);4: Bed > Chair or W/C: Min A (steadying Pt. > 75%);4: Chair or W/C > Bed: Min A (steadying Pt. > 75%)  FIM - Locomotion: Wheelchair Locomotion: Wheelchair: 0: Activity did not occur FIM - Locomotion: Ambulation Locomotion: Ambulation Assistive Devices: Designer, industrial/product Ambulation/Gait Assistance: 4: Min assist Locomotion: Ambulation: 2: Travels 50 - 149 ft with minimal assistance (Pt.>75%)  Comprehension Comprehension Mode: Auditory Comprehension: 5-Follows basic conversation/direction: With no assist  Expression Expression Mode: Verbal Expression: 6-Expresses complex ideas: With extra time/assistive device  Social Interaction Social Interaction: 6-Interacts appropriately with others with medication or extra time (anti-anxiety, antidepressant).  Problem Solving Problem Solving: 5-Solves basic problems: With no assist  Memory Memory: 5-Recognizes or recalls 90% of the time/requires cueing < 10% of the time    Medical Problem List and Plan:  1. Deconditioning after respiratory failure, myocardial infarction with history of prior right CVA with L HP 2. DVT Prophylaxis/Anticoagulation: Subcutaneous Lovenox. Monitor platelet counts any signs of bleeding  3. Neuropsych: This patient Is capable of making decisions on his/her own behalf.  4.NSTEMI. Cardiac catheterization showed no obstructive coronary artery  disease. Followup per  cardiology services  5. Hypertension. Coreg 12.5 mg twice a day, Cozaar 50 mg daily. Monitor with increased activity  6. Question CAP. Continue Zithromax. Nebulizers as directed. Check oxygen saturations every shift  7. COPD/tobacco abuse. Counseling. Change to prednisone 40mg  po last dose on 12/27 8. Diabetes mellitus. Blood sugars ranging from 141-223 and monitored closely while on Solu-Medrol. Continue Glucophage 500 mg twice a day , D/C SSI when off steroids  LOS (Days) 2 A FACE TO FACE EVALUATION WAS PERFORMED  KIRSTEINS,ANDREW E 07/06/2012, 7:32 AM

## 2012-07-07 ENCOUNTER — Inpatient Hospital Stay (HOSPITAL_COMMUNITY): Payer: Medicare Other | Admitting: Physical Therapy

## 2012-07-07 ENCOUNTER — Inpatient Hospital Stay (HOSPITAL_COMMUNITY): Payer: Medicare Other | Admitting: *Deleted

## 2012-07-07 ENCOUNTER — Inpatient Hospital Stay (HOSPITAL_COMMUNITY): Payer: Medicare Other | Admitting: Speech Pathology

## 2012-07-07 ENCOUNTER — Inpatient Hospital Stay (HOSPITAL_COMMUNITY): Payer: Medicare Other | Admitting: Occupational Therapy

## 2012-07-07 DIAGNOSIS — G8110 Spastic hemiplegia affecting unspecified side: Secondary | ICD-10-CM

## 2012-07-07 DIAGNOSIS — R5381 Other malaise: Secondary | ICD-10-CM

## 2012-07-07 LAB — GLUCOSE, CAPILLARY: Glucose-Capillary: 124 mg/dL — ABNORMAL HIGH (ref 70–99)

## 2012-07-07 NOTE — Progress Notes (Signed)
Physical Therapy Discharge Summary  Patient Details  Name: Christine Durham MRN: [id number] Date of Birth: 28-Feb-1941  Today's Date: 07/07/2012 Time: 8413-2440 and 1027-2536 Time Calculation (min): 63 min and 24  Patient has met 8 of 8 long term goals due to improved activity tolerance, improved balance, increased strength and independence with transfers.  Patient to discharge at an ambulatory level Modified Independent with RW and AFO.   Patient's care partner sister, was not present for education but has been providing the necessary supervision and transportation assistance since 2011.  Patient is modified independent with all mobility and can verbalize any assistance needed.   Reasons goals not met: All goals met  Recommendation:  Patient will benefit from ongoing skilled PT services in home health setting to continue to advance safe functional mobility, address ongoing impairments in generalized LE weakness, impaired activity tolerance/endurance, dynamic balance, gait, and minimize fall risk.  Equipment: No equipment provided  Reasons for discharge: treatment goals met and discharge from hospital  Patient/family agrees with progress made and goals achieved: Yes  PT Discharge Precautions/Restrictions Precautions Other Brace/Splint: L AFO Restrictions Weight Bearing Restrictions: No Vital Signs Therapy Vitals Pulse Rate: 86  BP: 99/66 mmHg Patient Position, if appropriate: Sitting Oxygen Therapy SpO2: 96 % O2 Device: None (Room air) Pulse Oximetry Type: Intermittent Pain Pain Assessment Pain Assessment: No/denies pain Sensation Sensation Light Touch: Impaired by gross assessment Stereognosis: Not tested Hot/Cold: Appears Intact Proprioception: Impaired by gross assessment Additional Comments: h/o R CVA with L impaired sensation to light touch Coordination Gross Motor Movements are Fluid and Coordinated: No Coordination and Movement Description: impaired secondary to  old CVA; residual L weakness Motor  Motor Motor: Hemiplegia  Mobility Bed Mobility Bed Mobility: Sit to Supine;Supine to Sit Supine to Sit: 7: Independent Sit to Supine: 7: Independent Transfers Stand Pivot Transfers: 6: Modified independent (Device/Increase time);With armrests (Requires arm rests secondary to LE weakness) Stand Pivot Transfer Details (indicate cue type and reason): Requires extra time and use of arm rests to stand secondary to LE weakness; use of RW or UE support on furniture to maintain balance; patient demonstrated modified independent stand pivot with RW or UE support on car door for simulated low car transfer verbalizing sequence prior to entering car by sitting first and then bringing LE into car. Locomotion  Ambulation Ambulation/Gait Assistance: 6: Modified independent (Device/Increase time) Ambulation Distance (Feet): 150 Feet Assistive device: Rolling walker;Other (Comment) (AFO) Ambulation/Gait Assistance Details: Patient continues to present with impaired gait sequence from previous CVA: L scissoring, weight on lateral foot, steppage gait but with step through sequence with RW and AFO mod I x 150' x 2 reps with rest breaks to assess HR and Sp02 Stairs / Additional Locomotion Stairs Assistance: 5: Supervision Stairs Assistance Details (indicate cue type and reason): Performed up and down one step with RW x 4 reps to simulate home entry/exit with supervision and verbal cues for safe stepping sequence ascending with RLE and descending with LLE secondary to weakness and AFO;.  Also performed up and down 5 stairs x 2 reps with 2 rails and supervision with alternating sequence for LE strengthening and increased community access  Stair Management Technique: Step to pattern;Forwards;With walker Number of Stairs: 4  Height of Stairs: 6  Wheelchair Mobility Wheelchair Mobility: No  Trunk/Postural Assessment  Cervical Assessment Cervical Assessment: Within Functional  Limits Thoracic Assessment Thoracic Assessment: Within Functional Limits Lumbar Assessment Lumbar Assessment: Within Functional Limits  Balance Static Sitting Balance Static Sitting - Balance Support: No  upper extremity supported Static Sitting - Level of Assistance: 7: Independent Dynamic Sitting Balance Dynamic Sitting - Level of Assistance: 7: Independent Static Standing Balance Static Standing - Balance Support: No upper extremity supported Static Standing - Level of Assistance: 6: Modified independent (Device/Increase time) Dynamic Standing Balance Dynamic Standing - Level of Assistance: 6: Modified independent (Device/Increase time) (with UE support and AFO) Extremity Assessment  RLE Assessment RLE Assessment: Within Functional Limits (5/5 except 4/5 hip flexion) LLE Assessment LLE Assessment: Exceptions to [facility] LLE Strength LLE Overall Strength: Deficits;Due to premorbid status LLE Overall Strength Comments: h/o R CVA with L weakness, 3/5 hip flexion, 4-/5 knee extension and flexion, 3/5 ankle DF  PM session: patient educated on and given handouts on pursed lip breathing: purpose of it and how to perform correctly; patient able to demonstrate effective pursed lip breathing in sitting; educated on importance of using pursed lip breathing during exertion to maximize ventilation and perfusion.  Also given handout on and educated on signs and symptoms of COPD and CHF exacerbation and when to contact MD.  Patient verbalized understanding.    See FIM for current functional status  Edman Circle [facility] 07/07/2012, 11:25 AM

## 2012-07-07 NOTE — Discharge Summary (Addendum)
NAMEHONORA, Christine Durham                ACCOUNT NO.:  [id number]  MEDICAL RECORD NO.:  [id number]  LOCATION:  4144                         FACILITY:  [facility]  PHYSICIAN:  Christine DurhamDATE OF BIRTH:  02/27/1941  DATE OF ADMISSION:  07/04/2012 DATE OF DISCHARGE:                              DISCHARGE SUMMARY   DISCHARGE DIAGNOSES: 1. Deconditioning after respiratory failure, myocardial infarction,     with history of prior cerebrovascular accident. 2. Subcutaneous Lovenox for deep vein thrombosis prophylaxis. 3. Non-ST elevation myocardial infarction. 4. Hypertension questionable community-acquired pneumonia. 5. Chronic obstructive pulmonary disease with tobacco abuse. 6. Diabetes mellitus.  HISTORY OF PRESENT ILLNESS:  This is a 71 year old right-handed female with COPD, tobacco abuse, as well as type 2 diabetes mellitus and previous stroke in 2011 with left-sided residual weakness.  Patient moved from [place] after most recent stroke in 2011 to live with her sister.  She was independent with a cane walker prior to admission. Presented June 27, 2012, with acute onset of shortness of breath and wheezing.  She denied any chest pain.  Patient was noted to be hypoxic on presentation and tachycardic in the emergency department.  EKG showed 2 mm of anterolateral ST elevation with code STEMI was called.  The patient with increased respiratory distress and was intubated.  Cardiac catheterization completed showing no evidence of obstructive coronary artery disease and with severely reduced LV systolic function due to stress-induced cardiomyopathy.  She remained on Aggrenox therapy prior to admission for stroke prophylaxis as well as Lovenox for DVT prophylaxis.  She was extubated on June 29, 2012, placed on Zithromax, Rocephin as well as Solu-Medrol after chest x-ray showed right basilar atelectasis versus infiltrate.  Her Solu-Medrol was to be slowly tapered.  Physical and  occupational therapy ongoing.  She was admitted for comprehensive rehab program.  PAST MEDICAL HISTORY:  See discharge diagnoses.  SOCIAL HISTORY:  She lives with a sister and assistance as needed. Functional history prior to admission used a cane walker she does not drive.  Functional status upon admission to rehab services was minimal assist to ambulate 100 feet with a rolling walker.  PHYSICAL EXAMINATION:  VITAL SIGNS:  Blood pressure 131/57, pulse 84, temperature 97.9, respirations 19. GENERAL:  This was an alert female, oriented x3. LUNGS:  Decreased breath sounds.  Clear to auscultation.  CARDIAC:  Rate controlled. ABDOMEN:  Soft and nontender.  Good bowel sounds.  She was mildly ataxic, finger-to-nose testing on the left side.  REHABILITATION HOSPITAL COURSE:  Patient was admitted to inpatient rehab services with therapies initiated on a 3-hour daily basis consisting of physical therapy, occupational therapy, and rehabilitation nursing.  The following issues were addressed during the patient's rehabilitation stay.  Pertaining to [name]'s deconditioning after respiratory failure-myocardial infarction with history of prior CVA she continued with therapies as advised.  She was to be slowly weaned from her Solu- Medrol.  She had no chest pain or shortness of breath and would follow up with Cardiology Services on discharge.  She remained on subcutaneous Lovenox for DVT prophylaxis throughout her course as well as Aggrenox for history of stroke.  Her blood pressures were well controlled with no  orthostatic changes and she remained on Coreg as well as Cozaar.  She did have a history of tobacco abuse.  She received bedside counseling in regard to cessation of nicotine products.  It was questionable if she would be compliant with these request.  She did have a history of diabetes mellitus and monitored closely while on Solu-Medrol and remained on Glucophage therapy.  Patient  received weekly collaborative interdisciplinary team conferences to discuss estimated length of stay, family teaching, and any barriers to her discharge.  She was occasionally incontinent of bladder with routine toileting, maintaining a mechanical soft diet with no signs of aspiration.  She was supervision with self-care minimal assist with ambulation with a rolling walker with in the room, minimal assist with rolling walker, and controlled environment.  Overall strength and endurance had greatly improved.  She felt she was at her baseline.  Family teaching was completed with her sister with plan to be discharged to home with ongoing therapies dictated as per rehab services.  DISCHARGE MEDICATIONS: 1. Zithromax 500 mg daily as directed, last dose on 12/28. 2. Coreg 12.5 mg b.i.d. 3. Aggrenox 1 capsule b.i.d. 4. Cozaar 50 mg daily. 5. Glucophage 500 mg b.i.d. 6. Multivitamin daily. 7. Protonix 40 mg daily. 8. Prednisone 40 mg daily last dose 12/27 DIET:  Dysphagia 3 diabetic diet.  SPECIAL INSTRUCTIONS:  Patient would follow up with Dr. Valera Durham of [facility], appointment to be made Dr. Claudette Durham at the outpatient rehab service office as directed, ongoing therapies were arranged as per rehab services.     Christine Durham, P.A.   ______________________________ Christine Durham, M.D.    DA/MEDQ  D:  07/07/2012  T:  07/07/2012  Job:  [id number]  cc:   Christine C. Wall, MD, [facility]

## 2012-07-07 NOTE — Discharge Summary (Signed)
  Discharge summary job [id number]

## 2012-07-07 NOTE — Progress Notes (Signed)
Occupational Therapy Session Note  Patient Details  Name: Christine Durham MRN: [id number] Date of Birth: [birth year]  Today's Date: 07/07/2012 Time: 0930-1028 Time Calculation (min): 58 min  Short Term Goals: Week 1:    STG = LTG due to short LOS  Skilled Therapeutic Interventions/Progress Updates:    Pt completed ADL retraining at overall modified independent with pt gathering clothes with use of RW and demonstrating ability to don AFO and shoes without assistance.  Pt completed toilet transfer with supervision with use of BSC over toilet secondary to decreased ability to get off of low toilet, pt reports having BSC over toilet at home for this purpose.  Pt refused to bathe at shower level, reporting that she doesn't shower every day and took a shower yesterday, however willing to sponge bathe at sink.  Pt reports having no questions prior to d/c home tomorrow, reporting she has been through this before.  Therapy Documentation Precautions:  Precautions Precautions: Fall Precaution Comments: left AFO Required Braces or Orthoses: Other Brace/Splint Other Brace/Splint: L AFO Restrictions Weight Bearing Restrictions: No General:   Vital Signs: Therapy Vitals Pulse Rate: 86  BP: 99/66 mmHg Patient Position, if appropriate: Sitting Oxygen Therapy SpO2: 96 % O2 Device: None (Room air) Pulse Oximetry Type: Intermittent Pain: Pain Assessment Pain Assessment: No/denies pain  See FIM for current functional status  Therapy/Group: Individual Therapy  Leonette Monarch 07/07/2012, 12:25 PM

## 2012-07-07 NOTE — Progress Notes (Signed)
Social Work Assessment and Plan Social Work Assessment and Plan  Patient Details  Name: Christine Durham MRN: [id number] Date of Birth: Jan 21, 1941  Today's Date: 07/07/2012  Problem List:  Patient Active Problem List  Diagnosis  . Acute on chronic systolic CHF (congestive heart failure)  . Acute respiratory failure  . Diabetes mellitus without complication  . NICM (nonischemic cardiomyopathy)  . Hypotension  . Non-ST elevation myocardial infarction (NSTEMI), initial care episode - Type 2 secondary to respiratory issues  . Tobacco abuse  . Asthma  . COPD (chronic obstructive pulmonary disease)  . Physical deconditioning  . CVA (cerebral infarction)   Past Medical History:  Past Medical History  Diagnosis Date  . 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 Diabetes mellitus without complication   . Asthma   . HTN (hypertension)   . Tobacco abuse    Past Surgical History:  Past Surgical History  Procedure Date  . Appendectomy    Social History:  reports that she has been smoking.  She does not have any smokeless tobacco history on file. She reports that she does not drink alcohol or use illicit drugs.  Family / Support Systems Marital Status: Divorced Patient Roles: Other (Comment) (Sister) Other Supports: Probation officer  [phone] Anticipated Caregiver: Sister Ability/Limitations of Caregiver: Sister recently retired and can Geneticist, molecular Availability: 24/7 Family Dynamics: Close knit family who look out for one another.  Pt came here to be with sister so she could help her out.  She feels very grateful to her.  Social History Preferred language: English Religion:  Cultural Background: No issues Education: High School Read: Yes Write: Yes Employment Status: Retired Fish farm manager Issues: No issues Guardian/Conservator: None-according to MD pt is capable of making her own decisions   Abuse/Neglect Physical Abuse:  Denies Verbal Abuse: Denies Sexual Abuse: Denies Exploitation of patient/patient's resources: Denies Self-Neglect: Denies  Emotional Status Pt's affect, behavior adn adjustment status: Pt is motivated and very pleased with how well she has done.  She is ready to go home, mod/i in her room.  She plans to try to stop smoking and realizes how bad it is for her with her CHF and COPD. Recent Psychosocial Issues: Other medical issues Pyschiatric History: No history feels she is doing well with hosptialization Substance Abuse History: Tobacco-plans to quit and will use the patch if MD approves.  Aware she needs to change her lifestyle habits for her health  Patient / Family Perceptions, Expectations & Goals Pt/Family understanding of illness & functional limitations: Pt and sister have a good understanding of her medical conditions and will follow them more closely.  Sister would like pt to quit smoking and will support her in this. Premorbid pt/family roles/activities: Sister, Engineer, mining, Retiree, Etc Anticipated changes in roles/activities/participation: Resume Pt/family expectations/goals: Pt states: " I am so glad to be doing so well, going home tomorrow."  Sister reports: " She feels her sister can do more for her health."  She is glad she has done so well this time.  Community Resources [employer]: Other (Comment) (Had in the past ) Premorbid Home Care/DME Agencies: None Transportation available at discharge: Sister Resource referrals recommended: Support group (specify) (Cardio/Pulm Support group)  Discharge Planning Living Arrangements: Other relatives Support Systems: Other relatives;Friends/neighbors Type of Residence: Private residence Insurance Resources: [employer] Financial Resources: Restaurant manager, fast food Screen Referred: No Living Expenses: Lives with family Money Management: Family;Patient Do you have any problems obtaining your medications?: No Home Management:  Sister Patient/Family Preliminary Plans: Return to sister's home and sister is there to assist if necessary.  Pt is at mod/i level and enocuraged to get up and move around more. Social Work Anticipated Follow Up Needs: Other (comment) (Cardiac Rehab) DC Planning Additional Notes/Comments: Short length of stay  Clinical Impression Pleasant female who is thankful to be doing so well.  She plans to quit smoking and aware of the health issues it causes.  Supportive sister who will assist. Try to connect with Cardiac Rehab in [place] at discharge.   Lucy Chris 07/07/2012, 12:06 PM

## 2012-07-07 NOTE — Progress Notes (Signed)
Speech Language Pathology Daily Session Note & Discharge Summary  Patient Details  Name: Tynisha Ogan MRN: [id number] Date of Birth: 1940/12/21  Today's Date: 07/07/2012 Time: 0830-0900 Time Calculation (min): 30 min  Short Term Goals: Week 1: SLP Short Term Goal 1 (Week 1): Pt will demonstrate divided attention during functional problem solving task with supervision cues.  SLP Short Term Goal 2 (Week 1): Pt will utilize compensatory strategies for memory during functional task with supervision cues.  SLP Short Term Goal 3 (Week 1): Pt will follow aspiration precautions in 80% of trials with supervision cues.   Skilled Therapeutic Interventions: Skilled treatmetn session focused on addressing dysphagia goals.  SLP observed patient consume regular textures and thin liquids via straw with delayed cough x1.  Patient exhibited a strong reflexive cough which appeared to remove suspected penetrates.  Patient reports her thinking problems are due to her age and not this stroke.  Of note, at baseline patient resided with sister who managed household tasks.  Patient verbalized awareness of physical deficits and need for follow up physical therapy.  SLP recommends upgrade to regualr textures and is in agreement that cognition is close to baseline.  As a reuslt, no further skilled SLP services are warranted at this time.  SLP educated patient on s/s of aspiration.     Patient missed 15 minutes due to refusal; she reported there was nothing more for SLP to do with/for her and did not want to be charged for anymore services.    FIM:  Comprehension Comprehension Mode: Auditory Comprehension: 5-Understands complex 90% of the time/Cues < 10% of the time Expression Expression Mode: Verbal Expression: 6-Expresses complex ideas: With extra time/assistive device Social Interaction Social Interaction: 6-Interacts appropriately with others with medication or extra time (anti-anxiety, antidepressant). Problem  Solving Problem Solving: 5-Solves basic problems: With no assist Memory Memory: 5-Recognizes or recalls 90% of the time/requires cueing < 10% of the time FIM - Eating Eating Activity: 7: Complete independence:no helper  Pain Pain Assessment Pain Assessment: No/denies pain  Therapy/Group: Individual Therapy   Speech Language Pathology Discharge Summary  Patient Details  Name: Minka Knight MRN: [id number] Date of Birth: Sep 14, 1940  Today's Date: 07/07/2012  Patient has met 3 of 4 long term goals.  Patient to discharge at overall Modified Independent;Min level.  Reasons goals not met: patient continues to demonstrate difficulty dividing attention; however, adequate for dischage and home    Clinical Impression/Discharge Summary: Patient met 3 out of 4 long term objectives due to gains in swallow function and use of external aids for recall.  Currently, patient is Mod I while consuming regular textures and thin liquids with occasional reflexive coughs that appear to remove suspected penetrate.  Patient requires min assist to utilize external aids to assist with recall; however, SLP suspects this to be baseline due to patient report and her residing with sister prior to admission so that her sister could help her manage basic tasks.  Patient verbalized adequate safety awareness of weakness and need for continued physical therapy.  As a result, to further skilled SLP services are warranted at this time.    Care Partner:  Caregiver Able to Provide Assistance: Other (comment) (per patient report)  Type of Caregiver Assistance: Physical;Cognitive  Recommendation:  None     Equipment: none   Reasons for discharge: Discharged from hospital   Patient/Family Agrees with Progress Made and Goals Achieved: Yes   See FIM for current functional status  Charlane Ferretti., CCC-SLP [phone]  Fae Pippin  07/07/2012, 9:19 AM

## 2012-07-07 NOTE — Progress Notes (Signed)
Patient ID: Christine Durham, female   DOB: 08-Aug-1940, 71 y.o.   MRN: [id number]  Subjective/Complaints: 71 y.o. 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 Patient moved from [place] after most recent stroke 2011 the liver there sister. She was independent with a cane/walker prior to admission. Presented 06/27/2012 with acute onset of shortness of breath and wheezing. She denied any chest pain. Patient was noted to be hypoxic on presentation and tachycardic in the emergency department. EKG showed 2 mm of anterior lateral ST elevation with code NSTEMI was called. Patient with increased respiratory distress and was intubated. Cardiac catheterization completed showing no evidence of obstructive coronary artery disease she was severely reduced LV systolic function due to stress induced cardiomyopathy. Patient remained on Aggrenox therapy prior to admission as well as subcutaneous Lovenox added for DVT prophylaxis. She was extubated on 06/29/2012. Placed on Zithromax and Rocephin as well as Solu-Medrol 07/02/2012 after chest x-ray showed right basilar atelectasis versus infiltrate patient remained afebrile as later Rocephin was discontinued and remained on Zithromax   No breathing c/o Feels ready for D/C in am No bowel or bladder concerns  Review of Systems  Respiratory: Positive for cough. Negative for hemoptysis, sputum production and shortness of breath.   Neurological: Positive for weakness.  All other systems reviewed and are negative.     Objective: Vital Signs: Blood pressure 132/66, pulse 75, temperature 97.8 F (36.6 C), temperature source Oral, resp. rate 19, height 5\' 7"  (1.702 m), weight 55.7 kg (122 lb 12.7 oz), SpO2 97.00%. No results found. Results for orders placed during the hospital encounter of 07/04/12 (from the past 72 hour(s))  GLUCOSE, CAPILLARY     Status:  Abnormal   Collection Time   07/04/12  4:23 PM      Component Value Range Comment   Glucose-Capillary 265 (*) 70 - 99 mg/dL    Comment 1 Notify RN     GLUCOSE, CAPILLARY     Status: Abnormal   Collection Time   07/04/12  8:13 PM      Component Value Range Comment   Glucose-Capillary 211 (*) 70 - 99 mg/dL    Comment 1 Notify RN     CBC WITH DIFFERENTIAL     Status: Abnormal   Collection Time   07/05/12  4:20 AM      Component Value Range Comment   WBC 12.5 (*) 4.0 - 10.5 K/uL    RBC 3.90  3.87 - 5.11 MIL/uL    Hemoglobin 11.7 (*) 12.0 - 15.0 g/dL    HCT 40.9 (*) 81.1 - 46.0 %    MCV 90.0  78.0 - 100.0 fL    MCH 30.0  26.0 - 34.0 pg    MCHC 33.3  30.0 - 36.0 g/dL    RDW 91.4  78.2 - 95.6 %    Platelets 341  150 - 400 K/uL    Neutrophils Relative 77  43 - 77 %    Neutro Abs 9.6 (*) 1.7 - 7.7 K/uL    Lymphocytes Relative 19  12 - 46 %    Lymphs Abs 2.3  0.7 - 4.0 K/uL    Monocytes Relative 4  3 - 12 %    Monocytes Absolute 0.6  0.1 - 1.0 K/uL    Eosinophils Relative 0  0 - 5 %    Eosinophils Absolute 0.0  0.0 - 0.7 K/uL    Basophils Relative  0  0 - 1 %    Basophils Absolute 0.0  0.0 - 0.1 K/uL   COMPREHENSIVE METABOLIC PANEL     Status: Abnormal   Collection Time   07/05/12  4:20 AM      Component Value Range Comment   Sodium 140  135 - 145 mEq/L    Potassium 4.7  3.5 - 5.1 mEq/L    Chloride 104  96 - 112 mEq/L    CO2 28  19 - 32 mEq/L    Glucose, Bld 194 (*) 70 - 99 mg/dL    BUN 11  6 - 23 mg/dL    Creatinine, Ser 1.30  0.50 - 1.10 mg/dL    Calcium 9.5  8.4 - 86.5 mg/dL    Total Protein 5.7 (*) 6.0 - 8.3 g/dL    Albumin 2.6 (*) 3.5 - 5.2 g/dL    AST 16  0 - 37 U/L    ALT 34  0 - 35 U/L    Alkaline Phosphatase 74  39 - 117 U/L    Total Bilirubin 0.3  0.3 - 1.2 mg/dL    GFR calc non Af Amer >90  >90 mL/min    GFR calc Af Amer >90  >90 mL/min   GLUCOSE, CAPILLARY     Status: Abnormal   Collection Time   07/05/12  7:50 AM      Component Value Range Comment    Glucose-Capillary 131 (*) 70 - 99 mg/dL    Comment 1 Notify RN     GLUCOSE, CAPILLARY     Status: Abnormal   Collection Time   07/05/12 11:55 AM      Component Value Range Comment   Glucose-Capillary 122 (*) 70 - 99 mg/dL   GLUCOSE, CAPILLARY     Status: Abnormal   Collection Time   07/05/12  4:39 PM      Component Value Range Comment   Glucose-Capillary 224 (*) 70 - 99 mg/dL    Comment 1 Notify RN     URINALYSIS, ROUTINE W REFLEX MICROSCOPIC     Status: Abnormal   Collection Time   07/05/12  5:00 PM      Component Value Range Comment   Color, Urine YELLOW  YELLOW    APPearance HAZY (*) CLEAR    Specific Gravity, Urine 1.027  1.005 - 1.030    pH 6.0  5.0 - 8.0    Glucose, UA >1000 (*) NEGATIVE mg/dL    Hgb urine dipstick NEGATIVE  NEGATIVE    Bilirubin Urine NEGATIVE  NEGATIVE    Ketones, ur NEGATIVE  NEGATIVE mg/dL    Protein, ur NEGATIVE  NEGATIVE mg/dL    Urobilinogen, UA 0.2  0.0 - 1.0 mg/dL    Nitrite NEGATIVE  NEGATIVE    Leukocytes, UA NEGATIVE  NEGATIVE   URINE MICROSCOPIC-ADD ON     Status: Abnormal   Collection Time   07/05/12  5:00 PM      Component Value Range Comment   Squamous Epithelial / LPF MANY (*) RARE    WBC, UA 0-2  <3 WBC/hpf    Urine-Other MUCOUS PRESENT     GLUCOSE, CAPILLARY     Status: Abnormal   Collection Time   07/05/12  9:07 PM      Component Value Range Comment   Glucose-Capillary 139 (*) 70 - 99 mg/dL   GLUCOSE, CAPILLARY     Status: Abnormal   Collection Time   07/06/12  7:17 AM  Component Value Range Comment   Glucose-Capillary 141 (*) 70 - 99 mg/dL   GLUCOSE, CAPILLARY     Status: Abnormal   Collection Time   07/06/12 11:40 AM      Component Value Range Comment   Glucose-Capillary 146 (*) 70 - 99 mg/dL   GLUCOSE, CAPILLARY     Status: Abnormal   Collection Time   07/06/12  4:03 PM      Component Value Range Comment   Glucose-Capillary 254 (*) 70 - 99 mg/dL   GLUCOSE, CAPILLARY     Status: Abnormal   Collection Time    07/06/12  8:48 PM      Component Value Range Comment   Glucose-Capillary 159 (*) 70 - 99 mg/dL    Comment 1 Notify RN     GLUCOSE, CAPILLARY     Status: Abnormal   Collection Time   07/07/12  7:08 AM      Component Value Range Comment   Glucose-Capillary 124 (*) 70 - 99 mg/dL    Comment 1 Notify RN        Constitutional: She is oriented to person, place, and time.  HENT:  Head: Normocephalic.  Eyes:  Pupils reactive to light  Neck: Neck supple. No thyromegaly present.  Cardiovascular: Normal rate and regular rhythm.  Pulmonary/Chest:  Decreased breath sounds at the bases but clear to auscultation, dyspneic with conversation  Abdominal: Soft. Bowel sounds are normal. She exhibits no distension.  Neurological: She is alert and oriented to person, place, and time.  She follows simple commands. Mild left facial droop and weakness. LUE is 3-In the deltoid, biceps, triceps, grip, LLE 3+In the hip flexors, knee extensors, ankle dorsiflexors and plantar flexors . Mild sensory loss to LT on the left. Right side grossly 4/5. Cognitively seems to display reasonable insight and awareness.  Cerebellar: Ataxia finger nose to finger testing on the left side  Skin: Skin is warm and dry.  Psychiatric: She has a normal mood and affect  Assessment/Plan: 1. Functional deficits secondary to Respiratory failure after MI and exacerbation of COPD which require 3+ hours per day of interdisciplinary therapy in a comprehensive inpatient rehab setting. Physiatrist is providing close team supervision and 24 hour management of active medical problems listed below. Physiatrist and rehab team continue to assess barriers to discharge/monitor patient progress toward functional and medical goals. FIM: FIM - Bathing Bathing Steps Patient Completed: Chest;Right Arm;Left Arm;Abdomen;Front perineal area;Right upper leg;Left upper leg;Right lower leg (including foot);Buttocks;Left lower leg (including foot) Bathing: 5:  Supervision: Safety issues/verbal cues  FIM - Upper Body Dressing/Undressing Upper body dressing/undressing steps patient completed: Thread/unthread left sleeve of pullover shirt/dress;Thread/unthread right sleeve of pullover shirt/dresss;Put head through opening of pull over shirt/dress;Pull shirt over trunk Upper body dressing/undressing: 5: Supervision: Safety issues/verbal cues FIM - Lower Body Dressing/Undressing Lower body dressing/undressing steps patient completed: Thread/unthread right pants leg;Thread/unthread left pants leg;Pull pants up/down;Fasten/unfasten right shoe;Fasten/unfasten left shoe;Don/Doff left shoe;Don/Doff right shoe Lower body dressing/undressing: 5: Supervision: Safety issues/verbal cues  FIM - Toileting Toileting steps completed by patient: Adjust clothing prior to toileting;Performs perineal hygiene;Adjust clothing after toileting Toileting: 5: Supervision: Safety issues/verbal cues  FIM - Archivist Transfers Assistive Devices: Art gallery manager Transfers: 4-To toilet/BSC: Min A (steadying Pt. > 75%);4-From toilet/BSC: Min A (steadying Pt. > 75%)  FIM - Bed/Chair Transfer Bed/Chair Transfer: 5: Supine > Sit: Supervision (verbal cues/safety issues);5: Sit > Supine: Supervision (verbal cues/safety issues);4: Bed > Chair or W/C: Min A (steadying Pt. > 75%);4: Chair  or W/C > Bed: Min A (steadying Pt. > 75%)  FIM - Locomotion: Wheelchair Locomotion: Wheelchair: 0: Activity did not occur FIM - Locomotion: Ambulation Locomotion: Ambulation Assistive Devices: Designer, industrial/product Ambulation/Gait Assistance: 4: Min assist Locomotion: Ambulation: 2: Travels 50 - 149 ft with minimal assistance (Pt.>75%)  Comprehension Comprehension Mode: Auditory Comprehension: 7-Follows complex conversation/direction: With no assist  Expression Expression Mode: Verbal Expression: 7-Expresses complex ideas: With no assist  Social Interaction Social Interaction:  7-Interacts appropriately with others - No medications needed.  Problem Solving Problem Solving: 7-Solves complex problems: Recognizes & self-corrects  Memory Memory: 7-Complete Independence: No helper    Medical Problem List and Plan:  1. Deconditioning after respiratory failure, myocardial infarction with history of prior right CVA with L HP 2. DVT Prophylaxis/Anticoagulation: Subcutaneous Lovenox. Monitor platelet counts any signs of bleeding  3. Neuropsych: This patient Is capable of making decisions on his/her own behalf.  4.NSTEMI. Cardiac catheterization showed no obstructive coronary artery disease. Followup per cardiology services  5. Hypertension. Coreg 12.5 mg twice a day, Cozaar 50 mg daily. Monitor with increased activity  6. Question CAP. Continue Zithromax. Nebulizers as directed. Check oxygen saturations every shift  7. COPD/tobacco abuse. Counseling. Change to prednisone 40mg  po last dose on 12/27 8. Diabetes mellitus. Blood sugars ranging from 141-223 and monitored closely while on Solu-Medrol. Continue Glucophage 500 mg twice a day , D/C SSI when off steroids  LOS (Days) 3 A FACE TO FACE EVALUATION WAS PERFORMED  KIRSTEINS,ANDREW E 07/07/2012, 8:03 AM

## 2012-07-07 NOTE — Progress Notes (Signed)
Social Work Patient ID: Christine Durham, female   DOB: 1940-09-30, 71 y.o.   MRN: [id number] Met with pt to discuss follow up, she reports she has had Home Health before and follows that exercise program at home. Discussed once follows up with Dr Daleen Squibb to refer to Cardiac rehab program at [facility]  She wants to wait and see him and then decide What is the best for her.  She plans to do her exercise program at home-home health gave her when they saw.  She reports money is tight and they do the Best they can.  Has all DME and feels she is better than she was at home before hospitalization.  She has follow up appt with both Dr. Daleen Squibb and primary MD-Dr. Sherril Croon. Ready for discharge tomorrow, mod/i in her room.

## 2012-07-07 NOTE — Progress Notes (Signed)
Occupational Therapy Discharge Summary  Patient Details  Name: Christine Durham MRN: [id number] Date of Birth: 1941-07-03  Today's Date: 07/07/2012  Patient has met 7 of 7 long term goals due to improved activity tolerance, postural control, ability to compensate for deficits and improved awareness.  Patient to discharge at overall Modified Independent level.  Patient's care partner, sister, was never present for any formal family education during this stay, however pt reports sister has been providing occasional assistance since CVA in 2011 and they have "been through this before".  Pt is modified independent with all self-care skills and supervision with toilet and tub/shower transfer and can verbalize when assistance is needed.  Reasons goals not met: N/A, all goals met  Recommendation:  Patient will not require further OT follow up at this time.  Equipment: No equipment provided Has all necessary equipment from CVA in 2011.  Reasons for discharge: treatment goals met and discharge from hospital  Patient/family agrees with progress made and goals achieved: Yes  OT Discharge Precautions/Restrictions  Precautions Precautions: Fall Required Braces or Orthoses: Other Brace/Splint Other Brace/Splint: Lt AFO General   Vital Signs Therapy Vitals Pulse Rate: 86  BP: 99/66 mmHg Patient Position, if appropriate: Sitting Oxygen Therapy SpO2: 96 % O2 Device: None (Room air) Pulse Oximetry Type: Intermittent Pain Pain Assessment Pain Assessment: No/denies pain Pain Score: 0-No pain ADL ADL Grooming: Modified independent Where Assessed-Grooming: Sitting at sink;Standing at sink Upper Body Bathing: Modified independent Where Assessed-Upper Body Bathing: Sitting at sink Lower Body Bathing: Modified independent Where Assessed-Lower Body Bathing: Sitting at sink Upper Body Dressing: Independent Where Assessed-Upper Body Dressing: Chair Lower Body Dressing: Modified  independent Where Assessed-Lower Body Dressing: Chair Toileting: Modified independent Where Assessed-Toileting: Teacher, adult education: Close supervision Toilet Transfer Method: Proofreader: Bedside commode (Pt requires BSC for elevated surface to increase independenc) Vision/Perception  Vision - History Patient Visual Report: No change from baseline Perception Perception: Within Functional Limits Praxis Praxis: Intact  Cognition    Impaired at baseline Sensation Sensation Light Touch: Impaired by gross assessment Stereognosis: Not tested Hot/Cold: Appears Intact Proprioception: Impaired by gross assessment Additional Comments: h/o R CVA with L impaired sensation to light touch Coordination Gross Motor Movements are Fluid and Coordinated: No Fine Motor Movements are Fluid and Coordinated: No (decreased Lt hand from CVA in 2011) Coordination and Movement Description: impaired secondary to old CVA; residual L weakness Motor  Motor Motor: Hemiplegia Mobility  Bed Mobility Bed Mobility: Sit to Supine;Supine to Sit Supine to Sit: 7: Independent Sit to Supine: 7: Independent  Trunk/Postural Assessment  Cervical Assessment Cervical Assessment: Within Functional Limits Thoracic Assessment Thoracic Assessment: Within Functional Limits Lumbar Assessment Lumbar Assessment: Within Functional Limits  Balance Static Sitting Balance Static Sitting - Balance Support: No upper extremity supported Static Sitting - Level of Assistance: 7: Independent Dynamic Sitting Balance Dynamic Sitting - Level of Assistance: 7: Independent Static Standing Balance Static Standing - Balance Support: No upper extremity supported Static Standing - Level of Assistance: 6: Modified independent (Device/Increase time) Dynamic Standing Balance Dynamic Standing - Level of Assistance: 6: Modified independent (Device/Increase time) (with UE support and AFO) Extremity/Trunk  Assessment RUE Assessment RUE Assessment: Within Functional Limits LUE Assessment LUE Assessment: Exceptions to [facility] (left hemiplegia from CVA in 2011, shoulder flex approx 130)  See FIM for current functional status  Leonette Monarch 07/07/2012, 2:51 PM

## 2012-07-07 NOTE — Care Management Note (Signed)
[facility]patient [facility] Individual Statement of Services  Patient Name:  Christine Durham  Date:  07/07/2012  Welcome to the [facility]patient [facility].  Our goal is to provide you with an individualized program based on your diagnosis and situation, designed to meet your specific needs.  With this comprehensive rehabilitation program, you will be expected to participate in at least 3 hours of rehabilitation therapies Monday-Friday, with modified therapy programming on the weekends.  Your rehabilitation program will include the following services:  Physical Therapy (PT), Occupational Therapy (OT), 24 hour per day rehabilitation nursing, Case Management (RN and Social Worker), Rehabilitation Medicine, Nutrition Services and Pharmacy Services  Weekly team conferences will be held on Wednesday to discuss your progress.  Your RN Case Designer, television/film set will talk with you frequently to get your input and to update you on team discussions.  Team conferences with you and your family in attendance may also be held.  Expected length of stay: 5-7 days Overall anticipated outcome: mod/i level  Depending on your progress and recovery, your program may change.  Your RN Case Estate agent will coordinate services and will keep you informed of any changes.  Your RN Sports coach and SW names and contact numbers are listed  below.  The following services may also be recommended but are not provided by the Inpatient Rehabilitation Center:   Driving Evaluations  Home Health Rehabiltiation Services  Outpatient Rehabilitatation [facility]  Vocational Rehabilitation   Arrangements will be made to provide these services after discharge if needed.  Arrangements include referral to agencies that provide these services.  Your insurance has been verified to be:  Medicare Your primary doctor is:  Dr Doreen Beam  Pertinent information will be shared with your doctor and your insurance  company.   Social Worker:  Dossie Der, [place] [phone]  Information discussed with and copy given to patient by: Lucy Chris, 07/07/2012, 11:56 AM

## 2012-07-08 DIAGNOSIS — R5381 Other malaise: Secondary | ICD-10-CM

## 2012-07-08 DIAGNOSIS — G8110 Spastic hemiplegia affecting unspecified side: Secondary | ICD-10-CM

## 2012-07-08 LAB — GLUCOSE, CAPILLARY: Glucose-Capillary: 134 mg/dL — ABNORMAL HIGH (ref 70–99)

## 2012-07-08 MED ORDER — CARVEDILOL 12.5 MG PO TABS: 12.5000 mg | ORAL_TABLET | Freq: Two times a day (BID) | ORAL | Status: DC

## 2012-07-08 MED ORDER — METFORMIN HCL 500 MG PO TABS: 500.0000 mg | ORAL_TABLET | Freq: Two times a day (BID) | ORAL | Status: DC

## 2012-07-08 MED ORDER — ASPIRIN-DIPYRIDAMOLE ER 25-200 MG PO CP12: 1.0000 | ORAL_CAPSULE | Freq: Two times a day (BID) | ORAL | Status: DC

## 2012-07-08 MED ORDER — LOSARTAN POTASSIUM 50 MG PO TABS: 50.0000 mg | ORAL_TABLET | Freq: Every day | ORAL | Status: DC

## 2012-07-08 MED ORDER — PANTOPRAZOLE SODIUM 40 MG PO TBEC: 40.0000 mg | DELAYED_RELEASE_TABLET | Freq: Every day | ORAL | Status: DC

## 2012-07-08 MED ORDER — ADULT MULTIVITAMIN W/MINERALS CH: 1.0000 | ORAL_TABLET | Freq: Every day | ORAL | Status: DC

## 2012-07-08 NOTE — Progress Notes (Signed)
Social Work Discharge Note Discharge Note  The overall goal for the admission was met for:   Discharge location: Yes-HOME WITH SISTER WHO CAN BE THERE WITH  Length of Stay: Yes-4 DAYS  Discharge activity level: Yes-MOD/I LEVEL  Home/community participation: Yes  Services provided included: MD, RD, PT, OT, RN, Pharmacy and SW  Financial Services: Medicare  Follow-up services arranged: Other: NO NEEDS HAS ALL DME AND HOME EXERCISIE PROGRAM-ONCE SSEN BY DR WALL REFERRAL TO CARDIAC REHAB [place]   Comments (or additional information):PT DID WELL HERE-WILL DO HOME EXERCISE PROGRAM AT HOME  Patient/Family verbalized understanding of follow-up arrangements: Yes  Individual responsible for coordination of the follow-up plan: SELF & CHARLAINE-SISTER  Confirmed correct DME delivered: Lucy Chris 07/08/2012    Lucy Chris

## 2012-07-08 NOTE — Progress Notes (Signed)
Pt discharged to home with family. Discharge instruction reviewed by PA. No concerns voiced at this time.

## 2012-07-08 NOTE — Progress Notes (Signed)
Patient ID: Christine Durham, female   DOB: 04/27/1941, 71 y.o.   MRN: [id number] Patient ID: Christine Durham, female   DOB: 05/16/1941, 71 y.o.   MRN: [id number]  Subjective/Complaints: 71 y.o. 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 Patient moved from [place] after most recent stroke 2011 the liver there sister. She was independent with a cane/walker prior to admission. Presented 06/27/2012 with acute onset of shortness of breath and wheezing. She denied any chest pain. Patient was noted to be hypoxic on presentation and tachycardic in the emergency department. EKG showed 2 mm of anterior lateral ST elevation with code NSTEMI was called. Patient with increased respiratory distress and was intubated. Cardiac catheterization completed showing no evidence of obstructive coronary artery disease she was severely reduced LV systolic function due to stress induced cardiomyopathy. Patient remained on Aggrenox therapy prior to admission as well as subcutaneous Lovenox added for DVT prophylaxis. She was extubated on 06/29/2012. Placed on Zithromax and Rocephin as well as Solu-Medrol 07/02/2012 after chest x-ray showed right basilar atelectasis versus infiltrate patient remained afebrile as later Rocephin was discontinued and remained on Zithromax   No breathing c/o Discussed pro/con of nicoderm.  No cigs for 11 days no cravings  Review of Systems  Respiratory: Positive for cough. Negative for hemoptysis, sputum production and shortness of breath.   Neurological: Positive for weakness.  All other systems reviewed and are negative.     Objective: Vital Signs: Blood pressure 124/75, pulse 74, temperature 97.7 F (36.5 C), temperature source Oral, resp. rate 18, height 5\' 7"  (1.702 m), weight 55.7 kg (122 lb 12.7 oz), SpO2 96.00%. No results found. Results for orders placed during the hospital  encounter of 07/04/12 (from the past 72 hour(s))  GLUCOSE, CAPILLARY     Status: Abnormal   Collection Time   07/05/12 11:55 AM      Component Value Range Comment   Glucose-Capillary 122 (*) 70 - 99 mg/dL   GLUCOSE, CAPILLARY     Status: Abnormal   Collection Time   07/05/12  4:39 PM      Component Value Range Comment   Glucose-Capillary 224 (*) 70 - 99 mg/dL    Comment 1 Notify RN     URINALYSIS, ROUTINE W REFLEX MICROSCOPIC     Status: Abnormal   Collection Time   07/05/12  5:00 PM      Component Value Range Comment   Color, Urine YELLOW  YELLOW    APPearance HAZY (*) CLEAR    Specific Gravity, Urine 1.027  1.005 - 1.030    pH 6.0  5.0 - 8.0    Glucose, UA >1000 (*) NEGATIVE mg/dL    Hgb urine dipstick NEGATIVE  NEGATIVE    Bilirubin Urine NEGATIVE  NEGATIVE    Ketones, ur NEGATIVE  NEGATIVE mg/dL    Protein, ur NEGATIVE  NEGATIVE mg/dL    Urobilinogen, UA 0.2  0.0 - 1.0 mg/dL    Nitrite NEGATIVE  NEGATIVE    Leukocytes, UA NEGATIVE  NEGATIVE   URINE MICROSCOPIC-ADD ON     Status: Abnormal   Collection Time   07/05/12  5:00 PM      Component Value Range Comment   Squamous Epithelial / LPF MANY (*) RARE    WBC, UA 0-2  <3 WBC/hpf    Urine-Other MUCOUS PRESENT     GLUCOSE, CAPILLARY     Status: Abnormal   Collection Time  07/05/12  9:07 PM      Component Value Range Comment   Glucose-Capillary 139 (*) 70 - 99 mg/dL   GLUCOSE, CAPILLARY     Status: Abnormal   Collection Time   07/06/12  7:17 AM      Component Value Range Comment   Glucose-Capillary 141 (*) 70 - 99 mg/dL   GLUCOSE, CAPILLARY     Status: Abnormal   Collection Time   07/06/12 11:40 AM      Component Value Range Comment   Glucose-Capillary 146 (*) 70 - 99 mg/dL   GLUCOSE, CAPILLARY     Status: Abnormal   Collection Time   07/06/12  4:03 PM      Component Value Range Comment   Glucose-Capillary 254 (*) 70 - 99 mg/dL   GLUCOSE, CAPILLARY     Status: Abnormal   Collection Time   07/06/12  8:48 PM       Component Value Range Comment   Glucose-Capillary 159 (*) 70 - 99 mg/dL    Comment 1 Notify RN     GLUCOSE, CAPILLARY     Status: Abnormal   Collection Time   07/07/12  7:08 AM      Component Value Range Comment   Glucose-Capillary 124 (*) 70 - 99 mg/dL    Comment 1 Notify RN     GLUCOSE, CAPILLARY     Status: Abnormal   Collection Time   07/07/12 11:31 AM      Component Value Range Comment   Glucose-Capillary 171 (*) 70 - 99 mg/dL    Comment 1 Notify RN     GLUCOSE, CAPILLARY     Status: Abnormal   Collection Time   07/07/12  4:09 PM      Component Value Range Comment   Glucose-Capillary 187 (*) 70 - 99 mg/dL    Comment 1 Notify RN     GLUCOSE, CAPILLARY     Status: Abnormal   Collection Time   07/07/12  9:15 PM      Component Value Range Comment   Glucose-Capillary 187 (*) 70 - 99 mg/dL    Comment 1 Notify RN     GLUCOSE, CAPILLARY     Status: Abnormal   Collection Time   07/08/12  7:02 AM      Component Value Range Comment   Glucose-Capillary 134 (*) 70 - 99 mg/dL    Comment 1 Notify RN        Constitutional: She is oriented to person, place, and time.  HENT:  Head: Normocephalic.  Eyes:  Pupils reactive to light  Neck: Neck supple. No thyromegaly present.  Cardiovascular: Normal rate and regular rhythm.  Pulmonary/Chest:  Decreased breath sounds at the bases but clear to auscultation, dyspneic with conversation  Abdominal: Soft. Bowel sounds are normal. She exhibits no distension.  Neurological: She is alert and oriented to person, place, and time.  She follows simple commands. Mild left facial droop and weakness. LUE is 3-In the deltoid, biceps, triceps, grip, LLE 3+In the hip flexors, knee extensors, ankle dorsiflexors and plantar flexors . Mild sensory loss to LT on the left. Right side grossly 4/5. Cognitively seems to display reasonable insight and awareness.  Cerebellar: Ataxia finger nose to finger testing on the left side  Skin: Skin is warm and dry.    Psychiatric: She has a normal mood and affect  Assessment/Plan: 1. Functional deficits secondary to Respiratory failure after MI and exacerbation of COPD  Stable for D/C F/u PCP Dr  Vyas F/u Cardiology Dr Daleen Squibb No PMR F/u    FIM: FIM - Bathing Bathing Steps Patient Completed: Right Arm;Chest;Left Arm;Abdomen;Front perineal area;Buttocks;Right upper leg;Left upper leg;Right lower leg (including foot);Left lower leg (including foot) Bathing: 6: More than reasonable amount of time  FIM - Upper Body Dressing/Undressing Upper body dressing/undressing steps patient completed: Thread/unthread right sleeve of pullover shirt/dresss;Thread/unthread left sleeve of pullover shirt/dress;Put head through opening of pull over shirt/dress;Pull shirt over trunk Upper body dressing/undressing: 6: More than reasonable amount of time FIM - Lower Body Dressing/Undressing Lower body dressing/undressing steps patient completed: Thread/unthread right underwear leg;Thread/unthread left underwear leg;Pull underwear up/down;Thread/unthread right pants leg;Thread/unthread left pants leg;Pull pants up/down;Don/Doff right sock;Don/Doff left sock;Don/Doff right shoe;Don/Doff left shoe Lower body dressing/undressing: 6: More than reasonable amount of time  FIM - Toileting Toileting steps completed by patient: Adjust clothing prior to toileting;Performs perineal hygiene;Adjust clothing after toileting Toileting Assistive Devices: Grab bar or rail for support Toileting: 6: More than reasonable amount of time  FIM - Diplomatic Services operational officer Devices: Walker;Elevated toilet seat Toilet Transfers: 5-To toilet/BSC: Supervision (verbal cues/safety issues);5-From toilet/BSC: Supervision (verbal cues/safety issues)  FIM - Banker Devices: Walker;Orthosis Bed/Chair Transfer: 7: Supine > Sit: No assist;7: Sit > Supine: No assist;6: Bed > Chair or W/C: No assist;6: Chair or  W/C > Bed: No assist  FIM - Locomotion: Wheelchair Locomotion: Wheelchair: 0: Activity did not occur FIM - Locomotion: Ambulation Locomotion: Ambulation Assistive Devices: Walker - Rolling;Orthosis Ambulation/Gait Assistance: 6: Modified independent (Device/Increase time) Locomotion: Ambulation: 6: Travels 150 ft or more with assistive device/no helper  Comprehension Comprehension Mode: Auditory Comprehension: 5-Understands complex 90% of the time/Cues < 10% of the time  Expression Expression Mode: Verbal Expression: 6-Expresses complex ideas: With extra time/assistive device  Social Interaction Social Interaction: 6-Interacts appropriately with others with medication or extra time (anti-anxiety, antidepressant).  Problem Solving Problem Solving: 5-Solves basic problems: With no assist  Memory Memory: 5-Recognizes or recalls 90% of the time/requires cueing < 10% of the time    Medical Problem List and Plan:  1. Deconditioning after respiratory failure, myocardial infarction with history of prior right CVA with L HP 2. DVT Prophylaxis/Anticoagulation: Subcutaneous Lovenox. Monitor platelet counts any signs of bleeding  3. Neuropsych: This patient Is capable of making decisions on his/her own behalf.  4.NSTEMI. Cardiac catheterization showed no obstructive coronary artery disease. Followup per cardiology services  5. Hypertension. Coreg 12.5 mg twice a day, Cozaar 50 mg daily. Monitor with increased activity  6. Question CAP. Continue Zithromax through 12/28. Nebulizers as directed. Check oxygen saturations every shift  7. COPD/tobacco abuse. Counseling. Change to prednisone 40mg  po last dose on 12/27 8. Diabetes mellitus. Blood sugars ranging from 141-223 and monitored closely while on Solu-Medrol. Continue Glucophage 500 mg twice a day , D/C SSI when off steroids  LOS (Days) 4 A FACE TO FACE EVALUATION WAS PERFORMED  Zienna Ahlin E 07/08/2012, 8:32 AM

## 2012-07-27 ENCOUNTER — Encounter: Payer: Medicare Other | Admitting: Cardiology

## 2013-03-12 ENCOUNTER — Emergency Department (HOSPITAL_COMMUNITY): Payer: Medicare Other

## 2013-03-12 ENCOUNTER — Encounter (HOSPITAL_COMMUNITY): Payer: Self-pay | Admitting: *Deleted

## 2013-03-12 ENCOUNTER — Emergency Department (HOSPITAL_COMMUNITY)
Admission: EM | Admit: 2013-03-12 | Discharge: 2013-03-12 | Disposition: A | Discharge status: Home / Self Care | Payer: Medicare Other | Attending: Emergency Medicine | Admitting: Emergency Medicine

## 2013-03-12 DIAGNOSIS — Z791 Long term (current) use of non-steroidal anti-inflammatories (NSAID): Secondary | ICD-10-CM | POA: Insufficient documentation

## 2013-03-12 DIAGNOSIS — Z8673 Personal history of transient ischemic attack (TIA), and cerebral infarction without residual deficits: Secondary | ICD-10-CM | POA: Insufficient documentation

## 2013-03-12 DIAGNOSIS — Y929 Unspecified place or not applicable: Secondary | ICD-10-CM | POA: Insufficient documentation

## 2013-03-12 DIAGNOSIS — W010XXA Fall on same level from slipping, tripping and stumbling without subsequent striking against object, initial encounter: Secondary | ICD-10-CM | POA: Insufficient documentation

## 2013-03-12 DIAGNOSIS — Z79899 Other long term (current) drug therapy: Secondary | ICD-10-CM | POA: Insufficient documentation

## 2013-03-12 DIAGNOSIS — J4489 Other specified chronic obstructive pulmonary disease: Secondary | ICD-10-CM | POA: Insufficient documentation

## 2013-03-12 DIAGNOSIS — Z87891 Personal history of nicotine dependence: Secondary | ICD-10-CM | POA: Insufficient documentation

## 2013-03-12 DIAGNOSIS — S22000A Wedge compression fracture of unspecified thoracic vertebra, initial encounter for closed fracture: Secondary | ICD-10-CM

## 2013-03-12 DIAGNOSIS — J449 Chronic obstructive pulmonary disease, unspecified: Secondary | ICD-10-CM | POA: Insufficient documentation

## 2013-03-12 DIAGNOSIS — I10 Essential (primary) hypertension: Secondary | ICD-10-CM | POA: Insufficient documentation

## 2013-03-12 DIAGNOSIS — S32009A Unspecified fracture of unspecified lumbar vertebra, initial encounter for closed fracture: Secondary | ICD-10-CM | POA: Insufficient documentation

## 2013-03-12 DIAGNOSIS — E119 Type 2 diabetes mellitus without complications: Secondary | ICD-10-CM | POA: Insufficient documentation

## 2013-03-12 DIAGNOSIS — S32000A Wedge compression fracture of unspecified lumbar vertebra, initial encounter for closed fracture: Secondary | ICD-10-CM

## 2013-03-12 DIAGNOSIS — I252 Old myocardial infarction: Secondary | ICD-10-CM | POA: Insufficient documentation

## 2013-03-12 DIAGNOSIS — S22009A Unspecified fracture of unspecified thoracic vertebra, initial encounter for closed fracture: Secondary | ICD-10-CM | POA: Insufficient documentation

## 2013-03-12 DIAGNOSIS — Y9389 Activity, other specified: Secondary | ICD-10-CM | POA: Insufficient documentation

## 2013-03-12 HISTORY — DX: Old myocardial infarction: I25.2

## 2013-03-12 MED ORDER — OXYCODONE-ACETAMINOPHEN 5-325 MG PO TABS: 2.0000 | ORAL_TABLET | ORAL | Status: DC | PRN

## 2013-03-12 NOTE — ED Notes (Signed)
Pt presents to er with c/o back pain after a fall that occurred Thursday, pt states that was turning around with her walker when her feet became tangled causing her to fall. Family reports that pt fell last week as well, denies any LOC, hitting head.

## 2013-03-12 NOTE — ED Provider Notes (Signed)
CSN: [id number]     Arrival date & time 03/12/13  1310 History  This chart was scribed for Donnetta Hutching, MD by Caryn Bee, ED Scribe and Greggory Stallion, ED Scribe. This patient was seen in room APA19/APA19 and the patient's care was started at 2:44 PM.    Chief Complaint  Patient presents with  . Fall  . Back Pain    The history is provided by the patient and a relative. No language interpreter was used.   HPI Comments: Level 5 Caveat: Dementia Christine Durham is a 72 y.o. female with history of COPD and dementia presents to the Emergency Department complaining of a fall this morning. Pt falls occasionally since she had a stroke 3 years ago. She states that she is having pain in her back. Pt denies pain in her hips, shoulders, and chest. Patient's sister has been transporting her in a wheelchair.  Questionable radicular symptoms to left leg.  Severity is mild to moderate. Palpation makes symptoms worse.  No bowel or bladder incontinence  Past Medical History  Diagnosis Date  . 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 Diabetes mellitus without complication   . Asthma   . HTN (hypertension)   . Tobacco abuse   . 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 Appendectomy     No family history on file. History  Substance Use Topics  . Smoking status: Former Games developer  . Smokeless tobacco: Not on file  . Alcohol Use: No   OB History   Grav Para Term Preterm Abortions TAB SAB Ect Mult Living                 Review of Systems A complete 10 system review of systems was obtained and all systems are negative except as noted in the HPI and PMH.    Allergies  Review of patient's allergies indicates no known allergies.  Home Medications   Current Outpatient Rx  Name  Route  Sig  Dispense  Refill  . atorvastatin (LIPITOR) 10 MG tablet   Oral   Take 10 mg by mouth at bedtime.         . dipyridamole-aspirin (AGGRENOX) 200-25 MG  per 12 hr capsule   Oral   Take 1 capsule by mouth 2 (two) times daily.   60 capsule   1   . donepezil (ARICEPT) 5 MG tablet   Oral   Take 5 mg by mouth at bedtime.         Marland Kitchen lisinopril (PRINIVIL,ZESTRIL) 5 MG tablet   Oral   Take 5 mg by mouth daily.         . metFORMIN (GLUCOPHAGE) 500 MG tablet   Oral   Take 1 tablet (500 mg total) by mouth 2 (two) times daily with a meal.   60 tablet   1   . naproxen (NAPROSYN) 500 MG tablet   Oral   Take 500 mg by mouth 2 (two) times daily with a meal.         . albuterol (PROVENTIL HFA;VENTOLIN HFA) 108 (90 BASE) MCG/ACT inhaler   Inhalation   Inhale 2 puffs into the lungs every 6 (six) hours as needed for wheezing.   1 Inhaler   2    BP 139/58  Pulse 102  Temp(Src) 97.4 F (36.3 C)  Resp 20  SpO2 96% Physical Exam  Nursing note and vitals reviewed.  Constitutional: She is oriented to person, place, and time. She appears well-developed and well-nourished.  HENT:  Head: Normocephalic and atraumatic.  Eyes: Conjunctivae and EOM are normal. Pupils are equal, round, and reactive to light.  Neck: Normal range of motion. Neck supple.  Cardiovascular: Normal rate, regular rhythm and normal heart sounds.   Pulmonary/Chest: Effort normal and breath sounds normal.  Abdominal: Soft. Bowel sounds are normal.  Musculoskeletal: Normal range of motion.  Lower lumbar spine tenderness.  Neurological: She is alert and oriented to person, place, and time.  Skin: Skin is warm and dry.  Psychiatric: She has a normal mood and affect.    ED Course  Procedures (including critical care time) DIAGNOSTIC STUDIES: Oxygen Saturation is 96% on room air, normal by my interpretation.    COORDINATION OF CARE: 3:18 PM-Discussed treatment plan which includes x-ray on pelvis and lumbar spine with pt at bedside and pt agreed to plan.   Dg Lumbar Spine Complete  03/12/2013   *RADIOLOGY REPORT*  Clinical Data: Fall, pain  LUMBAR SPINE - COMPLETE 4+  VIEW  Comparison: None.  Findings: Normal alignment.  Calcification of the aorta.  Mild compression deformity of T12.  Mild compression deformity of L3-8. Significant multilevel degenerative disc disease.  Calcified pelvic masses consistent with fibroids.  Mild to moderate gaseous distension of small and large bowel suggesting ileus.  IMPRESSION: Significant degenerative change.  There are two mild compression deformities, age uncertain.   Original Report Authenticated By: Esperanza Heir, M.D.   Dg Pelvis 1-2 Views  03/12/2013   *RADIOLOGY REPORT*  Clinical Data: Fall, back pain  PELVIS - 1-2 VIEW  Comparison: None.  Findings: There is a 5 cm and 2 cm mass in the pelvis both of which are calcified and appear consistent with fibroids.  There is diffuse mild to moderate gaseous distension of small and large bowel, suggesting ileus.  Pelvic bones are intact with no fracture identified.  IMPRESSION: No acute findings   Original Report Authenticated By: Esperanza Heir, M.D.    Labs Review Labs Reviewed - No data to display Imaging Review No results found.  MDM  No diagnosis found.  Discussed xray results with patient and her sister.  rx percocet.  Pt has primary care f/u   I personally performed the services described in this documentation, which was scribed in my presence. The recorded information has been reviewed and is accurate.    Donnetta Hutching, MD 03/12/13 1800

## 2013-03-22 ENCOUNTER — Emergency Department (HOSPITAL_COMMUNITY): Payer: Medicare Other

## 2013-03-22 ENCOUNTER — Inpatient Hospital Stay (HOSPITAL_COMMUNITY)
Admission: EM | Admit: 2013-03-22 | Discharge: 2013-03-25 | DRG: 064 | Disposition: A | Discharge status: Skilled Nursing Facility | Payer: Medicare Other | Attending: Internal Medicine | Admitting: Internal Medicine

## 2013-03-22 ENCOUNTER — Encounter (HOSPITAL_COMMUNITY): Payer: Self-pay

## 2013-03-22 DIAGNOSIS — I252 Old myocardial infarction: Secondary | ICD-10-CM

## 2013-03-22 DIAGNOSIS — I428 Other cardiomyopathies: Secondary | ICD-10-CM | POA: Diagnosis present

## 2013-03-22 DIAGNOSIS — R4701 Aphasia: Secondary | ICD-10-CM | POA: Diagnosis present

## 2013-03-22 DIAGNOSIS — I6350 Cerebral infarction due to unspecified occlusion or stenosis of unspecified cerebral artery: Principal | ICD-10-CM | POA: Diagnosis present

## 2013-03-22 DIAGNOSIS — B961 Klebsiella pneumoniae [K. pneumoniae] as the cause of diseases classified elsewhere: Secondary | ICD-10-CM | POA: Diagnosis present

## 2013-03-22 DIAGNOSIS — Z8673 Personal history of transient ischemic attack (TIA), and cerebral infarction without residual deficits: Secondary | ICD-10-CM

## 2013-03-22 DIAGNOSIS — R1310 Dysphagia, unspecified: Secondary | ICD-10-CM | POA: Diagnosis present

## 2013-03-22 DIAGNOSIS — T424X5A Adverse effect of benzodiazepines, initial encounter: Secondary | ICD-10-CM | POA: Diagnosis not present

## 2013-03-22 DIAGNOSIS — G9340 Encephalopathy, unspecified: Secondary | ICD-10-CM | POA: Diagnosis not present

## 2013-03-22 DIAGNOSIS — R471 Dysarthria and anarthria: Secondary | ICD-10-CM | POA: Diagnosis present

## 2013-03-22 DIAGNOSIS — J449 Chronic obstructive pulmonary disease, unspecified: Secondary | ICD-10-CM | POA: Diagnosis present

## 2013-03-22 DIAGNOSIS — Z720 Tobacco use: Secondary | ICD-10-CM | POA: Diagnosis present

## 2013-03-22 DIAGNOSIS — W19XXXA Unspecified fall, initial encounter: Secondary | ICD-10-CM | POA: Diagnosis present

## 2013-03-22 DIAGNOSIS — E119 Type 2 diabetes mellitus without complications: Secondary | ICD-10-CM | POA: Diagnosis present

## 2013-03-22 DIAGNOSIS — Z87891 Personal history of nicotine dependence: Secondary | ICD-10-CM

## 2013-03-22 DIAGNOSIS — J4489 Other specified chronic obstructive pulmonary disease: Secondary | ICD-10-CM | POA: Diagnosis present

## 2013-03-22 DIAGNOSIS — I639 Cerebral infarction, unspecified: Secondary | ICD-10-CM | POA: Diagnosis present

## 2013-03-22 DIAGNOSIS — I6529 Occlusion and stenosis of unspecified carotid artery: Secondary | ICD-10-CM | POA: Diagnosis present

## 2013-03-22 DIAGNOSIS — R4789 Other speech disturbances: Secondary | ICD-10-CM | POA: Diagnosis present

## 2013-03-22 DIAGNOSIS — N390 Urinary tract infection, site not specified: Secondary | ICD-10-CM | POA: Diagnosis present

## 2013-03-22 DIAGNOSIS — I10 Essential (primary) hypertension: Secondary | ICD-10-CM | POA: Diagnosis present

## 2013-03-22 DIAGNOSIS — S32009A Unspecified fracture of unspecified lumbar vertebra, initial encounter for closed fracture: Secondary | ICD-10-CM | POA: Diagnosis present

## 2013-03-22 LAB — COMPREHENSIVE METABOLIC PANEL
Alkaline Phosphatase: 127 U/L — ABNORMAL HIGH (ref 39–117)
BUN: 18 mg/dL (ref 6–23)
GFR calc Af Amer: >90 mL/min (ref >90)
Glucose, Bld: 152 mg/dL — ABNORMAL HIGH (ref 70–99)
Potassium: 4 mEq/L (ref 3.5–5.1)
Total Bilirubin: 0.3 mg/dL (ref 0.3–1.2)
Total Protein: 8 g/dL (ref 6.0–8.3)

## 2013-03-22 LAB — CBC WITH DIFFERENTIAL/PLATELET
Eosinophils Absolute: 0.2 10*3/uL (ref 0.0–0.7)
HCT: 37.9 % (ref 36.0–46.0)
Hemoglobin: 13 g/dL (ref 12.0–15.0)
Lymphs Abs: 2.4 10*3/uL (ref 0.7–4.0)
MCH: 30.5 pg (ref 26.0–34.0)
MCV: 89 fL (ref 78.0–100.0)
Monocytes Absolute: 0.6 10*3/uL (ref 0.1–1.0)
Monocytes Relative: 5 % (ref 3–12)
Neutrophils Relative %: 75 % (ref 43–77)
RBC: 4.26 MIL/uL (ref 3.87–5.11)

## 2013-03-22 LAB — URINE MICROSCOPIC-ADD ON

## 2013-03-22 LAB — APTT: aPTT: 30 seconds (ref 24–37)

## 2013-03-22 LAB — URINALYSIS, ROUTINE W REFLEX MICROSCOPIC
Bilirubin Urine: NEGATIVE
Glucose, UA: NEGATIVE mg/dL
Ketones, ur: 15 mg/dL — AB
pH: 6 (ref 5.0–8.0)

## 2013-03-22 MED ORDER — LORAZEPAM 2 MG/ML IJ SOLN
0.5000 mg | Freq: Once | INTRAMUSCULAR | Status: AC | PRN
  Administered 2013-03-23: 0.5 mg via INTRAVENOUS
  Filled 2013-03-22: qty 1

## 2013-03-22 MED ORDER — SODIUM CHLORIDE 0.9 % IV SOLN: 250.0000 mL | INTRAVENOUS | Status: DC | PRN

## 2013-03-22 MED ORDER — ASPIRIN 300 MG RE SUPP
RECTAL | Status: AC
  Filled 2013-03-22: qty 1

## 2013-03-22 MED ORDER — ASPIRIN 325 MG PO TABS
325.0000 mg | ORAL_TABLET | Freq: Every day | ORAL | Status: DC
  Administered 2013-03-25: 325 mg via ORAL
  Filled 2013-03-22 (×2): qty 1

## 2013-03-22 MED ORDER — ACETAMINOPHEN 650 MG RE SUPP
650.0000 mg | RECTAL | Status: DC | PRN
  Administered 2013-03-24: 650 mg via RECTAL
  Filled 2013-03-22: qty 1

## 2013-03-22 MED ORDER — ENOXAPARIN SODIUM 40 MG/0.4ML ~~LOC~~ SOLN
40.0000 mg | SUBCUTANEOUS | Status: DC
  Administered 2013-03-22 – 2013-03-24 (×3): 40 mg via SUBCUTANEOUS
  Filled 2013-03-22 (×3): qty 0.4

## 2013-03-22 MED ORDER — DEXTROSE 5 % IV SOLN
1.0000 g | INTRAVENOUS | Status: DC
  Administered 2013-03-22 – 2013-03-24 (×3): 1 g via INTRAVENOUS
  Filled 2013-03-22 (×5): qty 10

## 2013-03-22 MED ORDER — DEXTROSE 5 % IV SOLN
INTRAVENOUS | Status: AC
  Filled 2013-03-22: qty 10

## 2013-03-22 MED ORDER — SODIUM CHLORIDE 0.9 % IJ SOLN
3.0000 mL | Freq: Two times a day (BID) | INTRAMUSCULAR | Status: DC
  Administered 2013-03-22 – 2013-03-23 (×2): 3 mL via INTRAVENOUS

## 2013-03-22 MED ORDER — ACETAMINOPHEN 325 MG PO TABS: 650.0000 mg | ORAL_TABLET | ORAL | Status: DC | PRN

## 2013-03-22 MED ORDER — INSULIN ASPART 100 UNIT/ML ~~LOC~~ SOLN
0.0000 [IU] | SUBCUTANEOUS | Status: DC
Start: 2013-03-22 — End: 2013-03-25
  Administered 2013-03-23 (×3): 1 [IU] via SUBCUTANEOUS
  Administered 2013-03-24: 2 [IU] via SUBCUTANEOUS
  Administered 2013-03-24: 3 [IU] via SUBCUTANEOUS
  Administered 2013-03-25 (×2): 2 [IU] via SUBCUTANEOUS
  Administered 2013-03-25 (×2): 1 [IU] via SUBCUTANEOUS

## 2013-03-22 MED ORDER — SODIUM CHLORIDE 0.9 % IJ SOLN: 3.0000 mL | INTRAMUSCULAR | Status: DC | PRN

## 2013-03-22 MED ORDER — ASPIRIN 300 MG RE SUPP
300.0000 mg | Freq: Every day | RECTAL | Status: DC
  Administered 2013-03-22 – 2013-03-24 (×3): 300 mg via RECTAL
  Filled 2013-03-22 (×7): qty 1

## 2013-03-22 NOTE — ED Notes (Signed)
Pt's sister reports that since Sunday at 12:30 pt has had slurred speech and trouble swallowing.  Family reports that last night water was dribbling down her face.  Only ate yogurt yesterday.

## 2013-03-22 NOTE — H&P (Signed)
[facility] History and Physical  Javier Mamone WUJ:[id number] DOB: 03/14/1941 DOA: 03/22/2013   PCP: Ignatius Specking., MD  Specialists: She has been seen by Dr. Daleen Squibb with [facility] in the past but not recently  Chief Complaint: Difficulty swallowing  HPI: Christine Durham is a 72 y.o. female with a past medical history of stroke in 2011 with left-sided deficits, COPD, diabetes type 2, hypertension who was in her usual state of health till end of August when she had a mechanical fall. She started having back pain and was diagnosed with a lumbar compression fracture. She was asked to followup with her primary care physician. And then she was doing fine up until Sunday when she started having difficulty with speech. She was noted to have slurred speech by her sister. She was also having difficulty with eating solids initially and then with drinking fluids. Unfortunately neither the patient nor her sister sought attention for this. And then she started gagging and choking while having liquids last night. And so the sister decided to bring her to the hospital today. Patient denies any weakness on any one side except for her old left-sided weakness. She does admit to generalized fatigue. Denies any headaches. She does admit to some shortness of breath and cough with some phlegm. Denies any fever or chills. She ambulates at baseline using a walker. Due to her slurred speech it was difficult to obtain history from her. Some history was provided by the sister.  Home Medications: Prior to Admission medications   Medication Sig Start Date End Date Taking? Authorizing Provider  albuterol (PROVENTIL HFA;VENTOLIN HFA) 108 (90 BASE) MCG/ACT inhaler Inhale 2 puffs into the lungs every 6 (six) hours as needed for wheezing. 07/04/12  Yes Rhonda G Barrett, PA-C  atorvastatin (LIPITOR) 10 MG tablet Take 10 mg by mouth at bedtime.   Yes Historical Provider, MD  dipyridamole-aspirin (AGGRENOX) 200-25 MG per 12 hr  capsule Take 1 capsule by mouth 2 (two) times daily. 07/08/12  Yes Daniel J Angiulli, PA-C  donepezil (ARICEPT) 5 MG tablet Take 5 mg by mouth at bedtime.   Yes Historical Provider, MD  lisinopril (PRINIVIL,ZESTRIL) 5 MG tablet Take 5 mg by mouth daily.   Yes Historical Provider, MD  metFORMIN (GLUCOPHAGE) 500 MG tablet Take 1 tablet (500 mg total) by mouth 2 (two) times daily with a meal. 07/08/12  Yes Daniel J Angiulli, PA-C  naproxen (NAPROSYN) 500 MG tablet Take 500 mg by mouth 2 (two) times daily with a meal.   Yes Historical Provider, MD  oxyCODONE-acetaminophen (PERCOCET) 5-325 MG per tablet Take 2 tablets by mouth every 4 (four) hours as needed for pain. 03/12/13  Yes Donnetta Hutching, MD    Allergies: No Known Allergies  Past Medical History: Past Medical History  Diagnosis Date  . 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 Diabetes mellitus without complication   . Asthma   . HTN (hypertension)   . Tobacco abuse   . 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 Appendectomy      Social History:  reports that she quit smoking about 2 years ago. She does not have any smokeless tobacco history on file. She reports that she does not drink alcohol or use illicit drugs.  Living Situation: She is currently living with her sister Activity Level: Has been using a walker at baseline   Family History:  Family History  Problem Relation Age of Onset  . Diabetes Sister      Review of Systems - Unable to obtain due to dysarthria  Physical Examination  Filed Vitals:   03/22/13 1238 03/22/13 1354 03/22/13 1803 03/22/13 1929  BP: 131/68 121/80 133/83 142/83  Pulse: 88 83 96 92  Temp:    97.5 F (36.4 C)  TempSrc:      Resp: 22 22 20    Height:    5\' 6"  (1.676 m)  Weight:    57.698 kg (127 lb 3.2 oz)  SpO2: 98% 94% 97% 92%    General appearance: alert, cooperative, appears stated age and no distress Head: Normocephalic, without  obvious abnormality, atraumatic Eyes: conjunctivae/corneas clear. PERRL, EOM's intact. Fundi benign. Throat: lips, mucosa, and tongue normal; teeth and gums normal Neck: no adenopathy, no carotid bruit, no JVD, supple, symmetrical, trachea midline and thyroid not enlarged, symmetric, no tenderness/mass/nodules Back: symmetric, no curvature. ROM normal. some tenderness to palpation in lower back over lumbar spine Resp: Coarse breath sounds bilaterally, with a few crackles at the bases. No obvious wheezes Cardio: regular rate and rhythm, S1, S2 normal, no murmur, click, rub or gallop GI: soft, non-tender; bowel sounds normal; no masses,  no organomegaly Extremities: extremities normal, atraumatic, no cyanosis or edema Pulses: 2+ and symmetric Skin: Skin color, texture, turgor normal. No rashes or lesions Lymph nodes: Cervical, supraclavicular, and axillary nodes normal. Neurologic: She is alert. She was able to recognize her sister. No obvious cranial nerve deficits were noted. She has pronator drift on the left upper extremity. She does have about more left-sided weakness compared the right which is consistent with her old stroke. Cerebellar signs are abnormal. Gait was not assessed.  Laboratory Data: Results for orders placed during the hospital encounter of 03/22/13 (from the past 48 hour(s))  GLUCOSE, CAPILLARY     Status: Abnormal   Collection Time    03/22/13 12:08 PM      Result Value Range   Glucose-Capillary 145 (*) 70 - 99 mg/dL  CBC WITH DIFFERENTIAL     Status: Abnormal   Collection Time    03/22/13 12:48 PM      Result Value Range   WBC 12.7 (*) 4.0 - 10.5 K/uL   RBC 4.26  3.87 - 5.11 MIL/uL   Hemoglobin 13.0  12.0 - 15.0 g/dL   HCT 16.1  09.6 - 04.5 %   MCV 89.0  78.0 - 100.0 fL   MCH 30.5  26.0 - 34.0 pg   MCHC 34.3  30.0 - 36.0 g/dL   RDW 40.9  81.1 - 91.4 %   Platelets 306  150 - 400 K/uL   Neutrophils Relative % 75  43 - 77 %   Neutro Abs 9.5 (*) 1.7 - 7.7 K/uL    Lymphocytes Relative 19  12 - 46 %   Lymphs Abs 2.4  0.7 - 4.0 K/uL   Monocytes Relative 5  3 - 12 %   Monocytes Absolute 0.6  0.1 - 1.0 K/uL   Eosinophils Relative 1  0 - 5 %   Eosinophils Absolute 0.2  0.0 - 0.7 K/uL   Basophils Relative 0  0 - 1 %   Basophils Absolute 0.0  0.0 - 0.1 K/uL  COMPREHENSIVE METABOLIC PANEL     Status: Abnormal   Collection Time    03/22/13 12:48 PM      Result Value Range   Sodium 135  135 - 145 mEq/L   Potassium 4.0  3.5 - 5.1 mEq/L   Chloride 97  96 - 112 mEq/L   CO2 26  19 - 32 mEq/L   Glucose, Bld 152 (*) 70 - 99 mg/dL   BUN 18  6 - 23 mg/dL   Creatinine, Ser 1.61  0.50 - 1.10 mg/dL   Calcium 09.6  8.4 - 04.5 mg/dL   Total Protein 8.0  6.0 - 8.3 g/dL   Albumin 3.9  3.5 - 5.2 g/dL   AST 19  0 - 37 U/L   ALT 14  0 - 35 U/L   Alkaline Phosphatase 127 (*) 39 - 117 U/L   Total Bilirubin 0.3  0.3 - 1.2 mg/dL   GFR calc non Af Amer 85 (*) >90 mL/min   GFR calc Af Amer >90  >90 mL/min   Comment: (NOTE)     The eGFR has been calculated using the CKD EPI equation.     This calculation has not been validated in all clinical situations.     eGFR's persistently <90 mL/min signify possible Chronic Kidney     Disease.  APTT     Status: None   Collection Time    03/22/13 12:48 PM      Result Value Range   aPTT 30  24 - 37 seconds  PROTIME-INR     Status: None   Collection Time    03/22/13 12:48 PM      Result Value Range   Prothrombin Time 13.3  11.6 - 15.2 seconds   INR 1.03  0.00 - 1.49  TROPONIN I     Status: None   Collection Time    03/22/13 12:48 PM      Result Value Range   Troponin I <0.30  <0.30 ng/mL   Comment:            Due to the release kinetics of cTnI,     a negative result within the first hours     of the onset of symptoms does not rule out     myocardial infarction with certainty.     If myocardial infarction is still suspected,     repeat the test at appropriate intervals.  URINALYSIS, ROUTINE W REFLEX MICROSCOPIC      Status: Abnormal   Collection Time    03/22/13  4:10 PM      Result Value Range   Color, Urine YELLOW  YELLOW   APPearance CLOUDY (*) CLEAR   Specific Gravity, Urine 1.025  1.005 - 1.030   pH 6.0  5.0 - 8.0   Glucose, UA NEGATIVE  NEGATIVE mg/dL   Hgb urine dipstick TRACE (*) NEGATIVE   Bilirubin Urine NEGATIVE  NEGATIVE   Ketones, ur 15 (*) NEGATIVE mg/dL   Protein, ur NEGATIVE  NEGATIVE mg/dL   Urobilinogen, UA 0.2  0.0 - 1.0 mg/dL   Nitrite NEGATIVE  NEGATIVE   Leukocytes, UA MODERATE (*) NEGATIVE  URINE MICROSCOPIC-ADD ON     Status: Abnormal   Collection Time    03/22/13  4:10 PM      Result Value Range   Squamous Epithelial / LPF FEW (*) RARE   WBC, UA TOO NUMEROUS TO COUNT  <3 WBC/hpf   RBC / HPF 0-2  <3 RBC/hpf   Bacteria, UA MANY (*) RARE    Radiology Reports: Dg Chest 1 View  03/22/2013   *RADIOLOGY REPORT*  Clinical Data: Cough  CHEST - 1 VIEW  Comparison: 06/30/2012  Findings: The cardiac shadow is stable.  The  lungs are clear bilaterally.  No acute bony abnormality is seen.  IMPRESSION: No acute abnormality noted.   Original Report Authenticated By: Alcide Clever, M.D.   Ct Head Wo Contrast  03/22/2013   *RADIOLOGY REPORT*  Clinical Data: Dysphagia.  Aphasia.  History of stroke.  CT HEAD WITHOUT CONTRAST  Technique:  Contiguous axial images were obtained from the base of the skull through the vertex without contrast.  Comparison: None.  Findings: The ventricles are normal in overall configuration. There is mild ex vacuo dilation of the frontal horn of the left lateral ventricle from old adjacent deep white matter infarct. There is generalized ventricular and sulcal enlargement reflecting age related volume loss.  There are no parenchymal masses or mass effect.  There is a small lacunar infarct in the central left frontal lobe white matter and in the right internal capsule, posterior limb.  Patchy areas of white matter hypoattenuation are also noted consistent with moderate  chronic microvascular ischemic change.  There is no evidence of a recent transcortical infarct.  There are no extra-axial masses or abnormal fluid collections.  There is no intracranial hemorrhage.  The visualized sinuses and the mastoid air cells are clear.  IMPRESSION: No acute intracranial abnormalities.  Age related volume loss, old infarcts and moderate chronic microvascular ischemic change.   Original Report Authenticated By: Amie Portland, M.D.   Mr Brain Wo Contrast  03/22/2013   *RADIOLOGY REPORT*  Clinical Data: Dysphagia and aphasia for 3 days.  MRI HEAD WITHOUT CONTRAST  Technique:  Multiplanar, multiecho pulse sequences of the brain and surrounding structures were obtained according to standard protocol without intravenous contrast.  Comparison: 03/22/2013 head CT.  No comparison brain MR.  Findings: Motion degraded exam. Sequences were repeated.  Acute non hemorrhagic left frontal - parietal lobe infarcts.  Remote left caudate/lenticular nucleus infarct which was partially hemorrhagic containing blood breakdown products otherwise no evidence intracranial hemorrhage.  Remote thalamic and basal ganglia infarcts.  Marked small vessel disease type changes.  Global atrophy without hydrocephalus.  Abnormal appearance of the right internal carotid artery which may be the significantly narrowed.  IMPRESSION: Motion degraded exam. Sequences were repeated.  Acute non hemorrhagic left frontal - parietal lobe infarcts.  Remote left caudate/lenticular nucleus infarct which was partially hemorrhagic containing blood breakdown products otherwise no evidence intracranial hemorrhage.  Remote thalamic and basal ganglia infarcts.  Marked small vessel disease type changes.  Global atrophy without hydrocephalus.  Abnormal appearance of the right internal carotid artery which may be significantly narrowed.  Critical Value/emergent results were called by telephone at the time of interpretation on 03/22/2013 at 3:58 p.m. to   Dr. Lynelle Doctor., who verbally acknowledged these results.   Original Report Authenticated By: Lacy Duverney, M.D.    Electrocardiogram: Sinus rhythm at 99 beats per minute. PACs are noted. No Q waves. No concerning ST or T wave changes are noted. Intervals are normal  Problem List  Principal Problem:   Acute ischemic stroke Active Problems:   Diabetes mellitus without complication   NICM (nonischemic cardiomyopathy)   Tobacco abuse   COPD (chronic obstructive pulmonary disease)   Dysphagia   Assessment: This is a 72 year old Caucasian female who presents with dysphagia and dysarthria. She is found to have ischemic infarct in the left frontoparietal area. This is acute stroke. She is outside the window of TPA as her symptoms started more than 48 hours ago.  Plan: #1 acute stroke with dysphagia: She'll be admitted to the hospital. Swallow evaluation will be  obtained along with PT and OT evaluation. She'll be given aspirin rectally for now. Neurology will be consulted. MRA of the head as well as carotid Dopplers will be obtained. Echocardiogram will be obtained.  #2 history of nonischemic cardiomyopathy: She had an EF of 25% based on a cardiac catheterization in December of 2013. She did not have any obstructive CAD. She hasn't had any followup since then. We will repeat echocardiogram.  #3 recent lumbar compression fractures: Pain control will be provided. She does not have any neurological weakness in the lower extremities except that which is chronic. PT and OT evaluation will also be obtained.  #4 diabetes, type II: HbA1c will be checked. She'll be placed on a sliding scale.  #5 history of COPD: Continue to monitor. Her respiratory status closely. Aspiration precautions will be utilized. She likely did aspirate in the last few days. X-ray does not show any infiltrates. Monitor closely. She will be kept n.p.o. until a swallow evaluation is obtained.  #6 Possible UTI with abnormal, UA: She'll  be placed on antibiotics.   DVT Prophylaxis: Enoxaparin Code Status: Full code Family Communication: Discussed with the patient and her sister  Disposition Plan: Admit to telemetry   Further management decisions will depend on results of further testing and patient's response to treatment.  [facility]  [facility] Pager [phone]  If 7PM-7AM, please contact night-coverage [email or web address] Password [email or web address]  03/22/2013, 8:02 PM

## 2013-03-22 NOTE — ED Provider Notes (Signed)
CSN: [id number]     Arrival date & time 03/22/13  1059 History  This chart was scribed for Ward Givens, MD by Quintella Reichert, ED scribe.  This patient was seen in room APA19/AP[facility]19 and the patient's care was started at 12:23 PM.   Chief Complaint  Patient presents with  . Aphasia  . Dysphagia    The history is provided by a relative. No language interpreter was used.   Level 5 Caveat: Incomprehensible Speech  HPI Comments: Christine Durham is a 72 y.o. female with h/o stroke, COPD, MI, asthma, DM and HTN who presents to the Emergency Department complaining of trouble swallowing and slurred speech that began 3 days ago.  Sister reports that she initially noticed symptoms when pt did not want to eat, which is not typical for her.  She then noticed that pt's speech was becoming progressively more slurred.  Presently she is incomprehensible.  Pt is able to speak normally at baseline.  Sister reports that last night pt attempted to drink water and it dribbled out of her mouth.  Pt had a "bad fall" 13 days ago and was diagnosed with compression fractures of the lower spine and given a back brace and oxycodone.  Sister did not witness the fall but states pt was on her back when she found her and her head was not near any objects.  Pt has also been coughing for 9-10 days since lying in bed for 2 days after her fall.  She is 2-years post-stroke and has residual left-sided deficits.  She is ambulatory with a walker at baseline.  She has not seen a neurologist since discharge after her stroke.  She stopped smoking after her stroke.    PCP Dr Sherril Croon in [place]   Past Medical History  Diagnosis Date  . 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 Diabetes mellitus without complication   . Asthma   . HTN (hypertension)   . Tobacco abuse   . 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 Appendectomy     No family history on file. History  Substance Use Topics   . Smoking status: Former Games developer  . Smokeless tobacco: Not on file  . Alcohol Use: No  lives with her sister   OB History   Grav Para Term Preterm Abortions TAB SAB Ect Mult Living                 Review of Systems  Unable to perform ROS: Other    Allergies  Review of patient's allergies indicates no known allergies.  Home Medications   Current Outpatient Rx  Name  Route  Sig  Dispense  Refill  . albuterol (PROVENTIL HFA;VENTOLIN HFA) 108 (90 BASE) MCG/ACT inhaler   Inhalation   Inhale 2 puffs into the lungs every 6 (six) hours as needed for wheezing.   1 Inhaler   2   . atorvastatin (LIPITOR) 10 MG tablet   Oral   Take 10 mg by mouth at bedtime.         . dipyridamole-aspirin (AGGRENOX) 200-25 MG per 12 hr capsule   Oral   Take 1 capsule by mouth 2 (two) times daily.   60 capsule   1   . donepezil (ARICEPT) 5 MG tablet   Oral   Take 5 mg by mouth at bedtime.         Marland Kitchen lisinopril (PRINIVIL,ZESTRIL)  5 MG tablet   Oral   Take 5 mg by mouth daily.         . metFORMIN (GLUCOPHAGE) 500 MG tablet   Oral   Take 1 tablet (500 mg total) by mouth 2 (two) times daily with a meal.   60 tablet   1   . naproxen (NAPROSYN) 500 MG tablet   Oral   Take 500 mg by mouth 2 (two) times daily with a meal.         . oxyCODONE-acetaminophen (PERCOCET) 5-325 MG per tablet   Oral   Take 2 tablets by mouth every 4 (four) hours as needed for pain.   20 tablet   0    BP 94/81  Pulse 96  Temp(Src) 98.6 F (37 C) (Oral)  Resp 17  Ht 5\' 7"  (1.702 m)  Wt 122 lb (55.339 kg)  BMI 19.1 kg/m2  SpO2 96%  Vital signs normal except hypotension   Physical Exam  Nursing note and vitals reviewed. Constitutional: She appears well-developed and well-nourished.  Non-toxic appearance. She does not appear ill. No distress.  HENT:  Head: Normocephalic and atraumatic.  Right Ear: External ear normal.  Left Ear: External ear normal.  Nose: Nose normal. No mucosal edema or  rhinorrhea.  Mouth/Throat: Oropharynx is clear and moist and mucous membranes are normal. No dental abscesses or edematous.  Eyes: Conjunctivae and EOM are normal. Pupils are equal, round, and reactive to light.  Neck: Normal range of motion and full passive range of motion without pain. Neck supple.  Cardiovascular: Normal rate, regular rhythm and normal heart sounds.  Exam reveals no gallop and no friction rub.   No murmur heard. Pulmonary/Chest: Breath sounds normal. She is in respiratory distress (mild). She has no wheezes. She has no rhonchi. She has no rales. She exhibits no tenderness and no crepitus.  Coughing frequently, apparently trying to clear throat  Abdominal: Soft. Normal appearance and bowel sounds are normal. She exhibits no distension. There is no tenderness. There is no rebound and no guarding.  Musculoskeletal: Normal range of motion. She exhibits no edema and no tenderness.  Moves all extremities well.   Neurological: She is alert. She has normal strength. No cranial nerve deficit.  No facial asymmetry Able to lift all extremities against gravity Follows commands Speech very slurred and hard to understand  Skin: Skin is warm, dry and intact. No rash noted. No erythema. No pallor.  Psychiatric: She has a normal mood and affect. Her behavior is normal. Her mood appears not anxious. Her speech is slurred (very).    ED Course  Procedures (including critical care time)  DIAGNOSTIC STUDIES: Oxygen Saturation is 96% on room air, normal by my interpretation.    COORDINATION OF CARE: 12:33 PM-Discussed treatment plan which includes EKG, CXR, head CT and labs with pt at bedside and pt agreed to plan.   Discussed with patient her CT was normal and need to do MR to see an occult stroke  16:01 Dr Constance Goltz called MR results  Sister given MR results and need for admission.   17:37Waiting for hospitalist to call for admission after change of shift. Dr Blinda Leatherwood will make her  disposition.   Results for orders placed during the hospital encounter of 03/22/13  GLUCOSE, CAPILLARY      Result Value Range   Glucose-Capillary 145 (*) 70 - 99 mg/dL  CBC WITH DIFFERENTIAL      Result Value Range   WBC 12.7 (*) 4.0 -  10.5 K/uL   RBC 4.26  3.87 - 5.11 MIL/uL   Hemoglobin 13.0  12.0 - 15.0 g/dL   HCT 13.0  86.5 - 78.4 %   MCV 89.0  78.0 - 100.0 fL   MCH 30.5  26.0 - 34.0 pg   MCHC 34.3  30.0 - 36.0 g/dL   RDW 69.6  29.5 - 28.4 %   Platelets 306  150 - 400 K/uL   Neutrophils Relative % 75  43 - 77 %   Neutro Abs 9.5 (*) 1.7 - 7.7 K/uL   Lymphocytes Relative 19  12 - 46 %   Lymphs Abs 2.4  0.7 - 4.0 K/uL   Monocytes Relative 5  3 - 12 %   Monocytes Absolute 0.6  0.1 - 1.0 K/uL   Eosinophils Relative 1  0 - 5 %   Eosinophils Absolute 0.2  0.0 - 0.7 K/uL   Basophils Relative 0  0 - 1 %   Basophils Absolute 0.0  0.0 - 0.1 K/uL  COMPREHENSIVE METABOLIC PANEL      Result Value Range   Sodium 135  135 - 145 mEq/L   Potassium 4.0  3.5 - 5.1 mEq/L   Chloride 97  96 - 112 mEq/L   CO2 26  19 - 32 mEq/L   Glucose, Bld 152 (*) 70 - 99 mg/dL   BUN 18  6 - 23 mg/dL   Creatinine, Ser 1.32  0.50 - 1.10 mg/dL   Calcium 44.0  8.4 - 10.2 mg/dL   Total Protein 8.0  6.0 - 8.3 g/dL   Albumin 3.9  3.5 - 5.2 g/dL   AST 19  0 - 37 U/L   ALT 14  0 - 35 U/L   Alkaline Phosphatase 127 (*) 39 - 117 U/L   Total Bilirubin 0.3  0.3 - 1.2 mg/dL   GFR calc non Af Amer 85 (*) >90 mL/min   GFR calc Af Amer >90  >90 mL/min  APTT      Result Value Range   aPTT 30  24 - 37 seconds  PROTIME-INR      Result Value Range   Prothrombin Time 13.3  11.6 - 15.2 seconds   INR 1.03  0.00 - 1.49  TROPONIN I      Result Value Range   Troponin I <0.30  <0.30 ng/mL  URINALYSIS, ROUTINE W REFLEX MICROSCOPIC      Result Value Range   Color, Urine YELLOW  YELLOW   APPearance CLOUDY (*) CLEAR   Specific Gravity, Urine 1.025  1.005 - 1.030   pH 6.0  5.0 - 8.0   Glucose, UA NEGATIVE  NEGATIVE mg/dL    Hgb urine dipstick TRACE (*) NEGATIVE   Bilirubin Urine NEGATIVE  NEGATIVE   Ketones, ur 15 (*) NEGATIVE mg/dL   Protein, ur NEGATIVE  NEGATIVE mg/dL   Urobilinogen, UA 0.2  0.0 - 1.0 mg/dL   Nitrite NEGATIVE  NEGATIVE   Leukocytes, UA MODERATE (*) NEGATIVE  URINE MICROSCOPIC-ADD ON      Result Value Range   Squamous Epithelial / LPF FEW (*) RARE   WBC, UA TOO NUMEROUS TO COUNT  <3 WBC/hpf   RBC / HPF 0-2  <3 RBC/hpf   Bacteria, UA MANY (*) RARE   Laboratory interpretation all normal except hyperglycemia, leukocytosis   Dg Chest 1 View  03/22/2013   *RADIOLOGY REPORT*  Clinical Data: Cough  CHEST - 1 VIEW  Comparison: 06/30/2012  Findings: The cardiac shadow is  stable.  The lungs are clear bilaterally.  No acute bony abnormality is seen.  IMPRESSION: No acute abnormality noted.   Original Report Authenticated By: Alcide Clever, M.D.    Dg Pelvis 1-2 Views  03/12/2013   *RADIOLOGY REPORT*  Clinical Data: Fall, back pain  PELVIS - 1-2 VIEW  Comparison: None.  Findings: There is a 5 cm and 2 cm mass in the pelvis both of which are calcified and appear consistent with fibroids.  There is diffuse mild to moderate gaseous distension of small and large bowel, suggesting ileus.  Pelvic bones are intact with no fracture identified.  IMPRESSION: No acute findings   Original Report Authenticated By: Esperanza Heir, M.D.    Ct Head Wo Contrast  03/22/2013   *RADIOLOGY REPORT*  Clinical Data: Dysphagia.  Aphasia.  History of stroke.  CT HEAD WITHOUT CONTRAST  Technique:  Contiguous axial images were obtained from the base of the skull through the vertex without contrast.  Comparison: None.  Findings: The ventricles are normal in overall configuration. There is mild ex vacuo dilation of the frontal horn of the left lateral ventricle from old adjacent deep white matter infarct. There is generalized ventricular and sulcal enlargement reflecting age related volume loss.  There are no parenchymal masses or  mass effect.  There is a small lacunar infarct in the central left frontal lobe white matter and in the right internal capsule, posterior limb.  Patchy areas of white matter hypoattenuation are also noted consistent with moderate chronic microvascular ischemic change.  There is no evidence of a recent transcortical infarct.  There are no extra-axial masses or abnormal fluid collections.  There is no intracranial hemorrhage.  The visualized sinuses and the mastoid air cells are clear.  IMPRESSION: No acute intracranial abnormalities.  Age related volume loss, old infarcts and moderate chronic microvascular ischemic change.   Original Report Authenticated By: Amie Portland, M.D.    Mr Brain Wo Contrast  03/22/2013   *RADIOLOGY REPORT*  Clinical Data: Dysphagia and aphasia for 3 days.  MRI HEAD WITHOUT CONTRAST  Technique:  Multiplanar, multiecho pulse sequences of the brain and surrounding structures were obtained according to standard protocol without intravenous contrast.  Comparison: 03/22/2013 head CT.  No comparison brain MR.  Findings: Motion degraded exam. Sequences were repeated.  Acute non hemorrhagic left frontal - parietal lobe infarcts.  Remote left caudate/lenticular nucleus infarct which was partially hemorrhagic containing blood breakdown products otherwise no evidence intracranial hemorrhage.  Remote thalamic and basal ganglia infarcts.  Marked small vessel disease type changes.  Global atrophy without hydrocephalus.  Abnormal appearance of the right internal carotid artery which may be the significantly narrowed.  IMPRESSION: Motion degraded exam. Sequences were repeated.  Acute non hemorrhagic left frontal - parietal lobe infarcts.  Remote left caudate/lenticular nucleus infarct which was partially hemorrhagic containing blood breakdown products otherwise no evidence intracranial hemorrhage.  Remote thalamic and basal ganglia infarcts.  Marked small vessel disease type changes.  Global atrophy  without hydrocephalus.  Abnormal appearance of the right internal carotid artery which may be significantly narrowed.  Critical Value/emergent results were called by telephone at the time of interpretation on 03/22/2013 at 3:58 p.m. to  Dr. Lynelle Doctor., who verbally acknowledged these results.   Original Report Authenticated By: Lacy Duverney, M.D.    Date: 03/22/2013  Rate: 99  Rhythm: normal sinus rhythm and premature atrial contractions (PAC)  QRS Axis: normal  Intervals: normal  ST/T Wave abnormalities: normal  Conduction Disutrbances:none  Narrative Interpretation:  Old EKG Reviewed: unchanged from 06/28/2012     MDM   1. Acute ischemic stroke   2. Dysphagia     Plan admission  CRITICAL CARE Performed by: Devoria Albe L Total critical care time: 32 min Critical care time was exclusive of separately billable procedures and treating other patients. Critical care was necessary to treat or prevent imminent or life-threatening deterioration. Critical care was time spent personally by me on the following activities: development of treatment plan with patient and/or surrogate as well as nursing, discussions with consultants, evaluation of patient's response to treatment, examination of patient, obtaining history from patient or surrogate, ordering and performing treatments and interventions, ordering and review of laboratory studies, ordering and review of radiographic studies, pulse oximetry and re-evaluation of patient's condition.    I personally performed the services described in this documentation, which was scribed in my presence. The recorded information has been reviewed and considered.   Devoria Albe, MD, Armando Gang      Ward Givens, MD 03/22/13 [phone]

## 2013-03-23 ENCOUNTER — Inpatient Hospital Stay (HOSPITAL_COMMUNITY): Payer: Medicare Other

## 2013-03-23 ENCOUNTER — Encounter (HOSPITAL_COMMUNITY): Payer: Self-pay | Admitting: Radiology

## 2013-03-23 DIAGNOSIS — R1310 Dysphagia, unspecified: Secondary | ICD-10-CM

## 2013-03-23 DIAGNOSIS — N390 Urinary tract infection, site not specified: Secondary | ICD-10-CM

## 2013-03-23 DIAGNOSIS — I369 Nonrheumatic tricuspid valve disorder, unspecified: Secondary | ICD-10-CM

## 2013-03-23 LAB — LIPID PANEL
Cholesterol: 187 mg/dL (ref 0–200)
Total CHOL/HDL Ratio: 4.5 RATIO
Triglycerides: 145 mg/dL (ref <150)
VLDL: 29 mg/dL (ref 0–40)

## 2013-03-23 LAB — COMPREHENSIVE METABOLIC PANEL
AST: 18 U/L (ref 0–37)
Albumin: 3.6 g/dL (ref 3.5–5.2)
Alkaline Phosphatase: 124 U/L — ABNORMAL HIGH (ref 39–117)
Chloride: 96 mEq/L (ref 96–112)
Potassium: 3.8 mEq/L (ref 3.5–5.1)
Sodium: 134 mEq/L — ABNORMAL LOW (ref 135–145)
Total Bilirubin: 0.3 mg/dL (ref 0.3–1.2)

## 2013-03-23 LAB — CBC
MCH: 29.7 pg (ref 26.0–34.0)
MCV: 88.5 fL (ref 78.0–100.0)
Platelets: 278 10*3/uL (ref 150–400)
RDW: 12.6 % (ref 11.5–15.5)

## 2013-03-23 LAB — HEMOGLOBIN A1C
Hgb A1c MFr Bld: 8 % — ABNORMAL HIGH (ref <5.7)
Mean Plasma Glucose: 183 mg/dL — ABNORMAL HIGH (ref <117)

## 2013-03-23 LAB — GLUCOSE, CAPILLARY
Glucose-Capillary: 122 mg/dL — ABNORMAL HIGH (ref 70–99)
Glucose-Capillary: 125 mg/dL — ABNORMAL HIGH (ref 70–99)

## 2013-03-23 MED ORDER — CLOPIDOGREL BISULFATE 75 MG PO TABS
75.0000 mg | ORAL_TABLET | Freq: Every day | ORAL | Status: DC
  Administered 2013-03-24 – 2013-03-25 (×2): 75 mg via ORAL
  Filled 2013-03-23 (×2): qty 1

## 2013-03-23 MED ORDER — SODIUM CHLORIDE 0.9 % IV SOLN
INTRAVENOUS | Status: DC
  Administered 2013-03-23: 16:00:00 via INTRAVENOUS

## 2013-03-23 NOTE — Progress Notes (Signed)
Patient seen and examined. Agree with not as above per [name], NP.  The patient is admitted with an acute stroke, she has significant aphasia and dysphasia. She has been seen by neurology was recommended to him supportive therapy for now. Physical therapy as he and has recommended skilled nursing facility placement. The patient is agreeable. Regarding her swallowing, she still currently n.p.o. and awaits a modified barium swallow evaluation. Further recommendations her diet based on that study. 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 She will need further followup with vascular surgery to monitor this.  MEMON,JEHANZEB

## 2013-03-23 NOTE — Progress Notes (Signed)
UR Chart Review Completed  

## 2013-03-23 NOTE — Progress Notes (Signed)
*  PRELIMINARY RESULTS* Echocardiogram 2D Echocardiogram has been performed.  Christine Durham 03/23/2013, 4:47 PM

## 2013-03-23 NOTE — Consult Note (Signed)
[facility] Christine Durham A. Christine Pilgrim, MD     [email or web address]          Christine Durham is an 72 y.o. female.   ASSESSMENT/PLAN: 1. Acute encephalopathy likely due to benzodiazepine effect.  2. Acute left frontal infarct in the setting of intracranial occlusive disease. In fact, the patient has multivessel intracranial occlusive disease. I recommend the patient be placed on dual antiplatelet regimen aspirin/Plavix for 3 months. Suddenly, she should be placed on single agent either aspirin or Plavix. The patient should be maintained on statin medications. Other risk factor modification including his blood pressure control and blood sugar control. Swallowing evaluation and speech therapy is also suggested.  The patient is a 72 year old white female who presents with a 2-3 day history of acute dysarthria and dysphagia. The patient has multiple risk factors as outlined below. 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 The patient did have a MRI scan showed an acute infarct involving the left frontal lobe. Repeat scans were done this morning to obtain MRA. The patient was given Ativan is quite drowsy this morning during the evaluation. The review of systems is therefore not obtainable.  GENERAL: The patient is quite drowsy. She is in no acute distress.  HEENT: Neck is supple and atraumatic.  ABDOMEN: soft  EXTREMITIES: No edema   BACK: Unremarkable.  SKIN: Normal by inspection.    MENTAL STATUS: The patient is sleeping and snoring during the evaluation. She opens her eyes to sternal rub but then falls back to sleep. She does not follow commands. She moans and groans.  CRANIAL NERVES: The right pupil is irregular status post surgery and nonreactive; the left is 4 mm and reactive; extra ocular movements are full by oculocephalic reflexes, there is no significant nystagmus; upper and lower facial muscles are normal in strength and symmetric,  there is no flattening of the nasolabial folds.  MOTOR: She has antigravity strength bilaterally but has a clear left upper extremity pronator drift. The strength throughout is graded as 3-4/5.  COORDINATION: There appears to be mild dysmetria of the left upper extremity likely commensurate with the left-sided weakness. There are no tremors.  REFLEXES: Deep tendon reflexes are symmetrical and normal.   The patient's brain MRI is reviewed in person. 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 There is no acute infarct seen on diffusion imaging involving about 4 scans involving the left frontal area. There is a chronic infarct involving the left lenticular form nucleus. The MRA shows cutoff of the left MCA with a small amount of reconstitution distally. This seemed to be the M1 aspect of the MCA. There is also a drop-off off the right MCA with better reconstitution. There is some irregularity involving the left intracranial ICA before and that the carotid siphon.   Past Medical History  Diagnosis Date  . 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 Diabetes mellitus without complication   . Asthma   . HTN (hypertension)   . Tobacco abuse   . 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 Appendectomy      Family History  Problem Relation Age of Onset  . Diabetes Sister     Social History:  reports that she quit smoking about 2 years ago. She does not have any smokeless tobacco history on file. She reports that she does not drink alcohol or use illicit  drugs.  Allergies: No Known Allergies  Medications: Prior to Admission medications   Medication Sig Start Date End Date Taking? Authorizing Provider  albuterol (PROVENTIL HFA;VENTOLIN HFA) 108 (90 BASE) MCG/ACT inhaler Inhale 2 puffs into the lungs every 6 (six) hours as needed for wheezing. 07/04/12  Yes Rhonda G Barrett, PA-C  atorvastatin (LIPITOR) 10 MG tablet Take 10 mg by mouth  at bedtime.   Yes Historical Provider, MD  dipyridamole-aspirin (AGGRENOX) 200-25 MG per 12 hr capsule Take 1 capsule by mouth 2 (two) times daily. 07/08/12  Yes Daniel J Angiulli, PA-C  donepezil (ARICEPT) 5 MG tablet Take 5 mg by mouth at bedtime.   Yes Historical Provider, MD  lisinopril (PRINIVIL,ZESTRIL) 5 MG tablet Take 5 mg by mouth daily.   Yes Historical Provider, MD  metFORMIN (GLUCOPHAGE) 500 MG tablet Take 1 tablet (500 mg total) by mouth 2 (two) times daily with a meal. 07/08/12  Yes Daniel J Angiulli, PA-C  naproxen (NAPROSYN) 500 MG tablet Take 500 mg by mouth 2 (two) times daily with a meal.   Yes Historical Provider, MD  oxyCODONE-acetaminophen (PERCOCET) 5-325 MG per tablet Take 2 tablets by mouth every 4 (four) hours as needed for pain. 03/12/13  Yes Donnetta Hutching, MD    Scheduled Meds: . aspirin  300 mg Rectal Daily   Or  . aspirin  325 mg Oral Daily  . cefTRIAXone (ROCEPHIN)  IV  1 g Intravenous Q24H  . enoxaparin (LOVENOX) injection  40 mg Subcutaneous Q24H  . insulin aspart  0-9 Units Subcutaneous Q4H  . sodium chloride  3 mL Intravenous Q12H   Continuous Infusions:  PRN Meds:.sodium chloride, acetaminophen, acetaminophen, sodium chloride   Blood pressure 158/80, pulse 93, temperature 98.8 F (37.1 C), temperature source Oral, resp. rate 20, height 5\' 6"  (1.676 m), weight 57.698 kg (127 lb 3.2 oz), SpO2 94.00%.   Results for orders placed during the hospital encounter of 03/22/13 (from the past 48 hour(s))  GLUCOSE, CAPILLARY     Status: Abnormal   Collection Time    03/22/13 12:08 PM      Result Value Range   Glucose-Capillary 145 (*) 70 - 99 mg/dL  CBC WITH DIFFERENTIAL     Status: Abnormal   Collection Time    03/22/13 12:48 PM      Result Value Range   WBC 12.7 (*) 4.0 - 10.5 K/uL   RBC 4.26  3.87 - 5.11 MIL/uL   Hemoglobin 13.0  12.0 - 15.0 g/dL   HCT 16.1  09.6 - 04.5 %   MCV 89.0  78.0 - 100.0 fL   MCH 30.5  26.0 - 34.0 pg   MCHC 34.3  30.0 - 36.0  g/dL   RDW 40.9  81.1 - 91.4 %   Platelets 306  150 - 400 K/uL   Neutrophils Relative % 75  43 - 77 %   Neutro Abs 9.5 (*) 1.7 - 7.7 K/uL   Lymphocytes Relative 19  12 - 46 %   Lymphs Abs 2.4  0.7 - 4.0 K/uL   Monocytes Relative 5  3 - 12 %   Monocytes Absolute 0.6  0.1 - 1.0 K/uL   Eosinophils Relative 1  0 - 5 %   Eosinophils Absolute 0.2  0.0 - 0.7 K/uL   Basophils Relative 0  0 - 1 %   Basophils Absolute 0.0  0.0 - 0.1 K/uL  COMPREHENSIVE METABOLIC PANEL     Status: Abnormal   Collection Time  03/22/13 12:48 PM      Result Value Range   Sodium 135  135 - 145 mEq/L   Potassium 4.0  3.5 - 5.1 mEq/L   Chloride 97  96 - 112 mEq/L   CO2 26  19 - 32 mEq/L   Glucose, Bld 152 (*) 70 - 99 mg/dL   BUN 18  6 - 23 mg/dL   Creatinine, Ser 4.09  0.50 - 1.10 mg/dL   Calcium 81.1  8.4 - 91.4 mg/dL   Total Protein 8.0  6.0 - 8.3 g/dL   Albumin 3.9  3.5 - 5.2 g/dL   AST 19  0 - 37 U/L   ALT 14  0 - 35 U/L   Alkaline Phosphatase 127 (*) 39 - 117 U/L   Total Bilirubin 0.3  0.3 - 1.2 mg/dL   GFR calc non Af Amer 85 (*) >90 mL/min   GFR calc Af Amer >90  >90 mL/min   Comment: (NOTE)     The eGFR has been calculated using the CKD EPI equation.     This calculation has not been validated in all clinical situations.     eGFR's persistently <90 mL/min signify possible Chronic Kidney     Disease.  APTT     Status: None   Collection Time    03/22/13 12:48 PM      Result Value Range   aPTT 30  24 - 37 seconds  PROTIME-INR     Status: None   Collection Time    03/22/13 12:48 PM      Result Value Range   Prothrombin Time 13.3  11.6 - 15.2 seconds   INR 1.03  0.00 - 1.49  TROPONIN I     Status: None   Collection Time    03/22/13 12:48 PM      Result Value Range   Troponin I <0.30  <0.30 ng/mL   Comment:            Due to the release kinetics of cTnI,     a negative result within the first hours     of the onset of symptoms does not rule out     myocardial infarction with certainty.      If myocardial infarction is still suspected,     repeat the test at appropriate intervals.  URINALYSIS, ROUTINE W REFLEX MICROSCOPIC     Status: Abnormal   Collection Time    03/22/13  4:10 PM      Result Value Range   Color, Urine YELLOW  YELLOW   APPearance CLOUDY (*) CLEAR   Specific Gravity, Urine 1.025  1.005 - 1.030   pH 6.0  5.0 - 8.0   Glucose, UA NEGATIVE  NEGATIVE mg/dL   Hgb urine dipstick TRACE (*) NEGATIVE   Bilirubin Urine NEGATIVE  NEGATIVE   Ketones, ur 15 (*) NEGATIVE mg/dL   Protein, ur NEGATIVE  NEGATIVE mg/dL   Urobilinogen, UA 0.2  0.0 - 1.0 mg/dL   Nitrite NEGATIVE  NEGATIVE   Leukocytes, UA MODERATE (*) NEGATIVE  URINE MICROSCOPIC-ADD ON     Status: Abnormal   Collection Time    03/22/13  4:10 PM      Result Value Range   Squamous Epithelial / LPF FEW (*) RARE   WBC, UA TOO NUMEROUS TO COUNT  <3 WBC/hpf   RBC / HPF 0-2  <3 RBC/hpf   Bacteria, UA MANY (*) RARE  GLUCOSE, CAPILLARY     Status: Abnormal  Collection Time    03/22/13  9:25 PM      Result Value Range   Glucose-Capillary 115 (*) 70 - 99 mg/dL   Comment 1 Notify RN    GLUCOSE, CAPILLARY     Status: Abnormal   Collection Time    03/23/13 12:03 AM      Result Value Range   Glucose-Capillary 130 (*) 70 - 99 mg/dL   Comment 1 Notify RN    GLUCOSE, CAPILLARY     Status: None   Collection Time    03/23/13  4:26 AM      Result Value Range   Glucose-Capillary 96  70 - 99 mg/dL   Comment 1 Notify RN    COMPREHENSIVE METABOLIC PANEL     Status: Abnormal   Collection Time    03/23/13  5:48 AM      Result Value Range   Sodium 134 (*) 135 - 145 mEq/L   Potassium 3.8  3.5 - 5.1 mEq/L   Chloride 96  96 - 112 mEq/L   CO2 23  19 - 32 mEq/L   Glucose, Bld 124 (*) 70 - 99 mg/dL   BUN 15  6 - 23 mg/dL   Creatinine, Ser 9.60  0.50 - 1.10 mg/dL   Calcium 45.4  8.4 - 09.8 mg/dL   Total Protein 7.8  6.0 - 8.3 g/dL   Albumin 3.6  3.5 - 5.2 g/dL   AST 18  0 - 37 U/L   ALT 13  0 - 35 U/L   Alkaline  Phosphatase 124 (*) 39 - 117 U/L   Total Bilirubin 0.3  0.3 - 1.2 mg/dL   GFR calc non Af Amer 87 (*) >90 mL/min   GFR calc Af Amer >90  >90 mL/min   Comment: (NOTE)     The eGFR has been calculated using the CKD EPI equation.     This calculation has not been validated in all clinical situations.     eGFR's persistently <90 mL/min signify possible Chronic Kidney     Disease.  CBC     Status: None   Collection Time    03/23/13  5:48 AM      Result Value Range   WBC 10.2  4.0 - 10.5 K/uL   RBC 4.51  3.87 - 5.11 MIL/uL   Hemoglobin 13.4  12.0 - 15.0 g/dL   HCT 11.9  14.7 - 82.9 %   MCV 88.5  78.0 - 100.0 fL   MCH 29.7  26.0 - 34.0 pg   MCHC 33.6  30.0 - 36.0 g/dL   RDW 56.2  13.0 - 86.5 %   Platelets 278  150 - 400 K/uL  LIPID PANEL     Status: Abnormal   Collection Time    03/23/13  5:48 AM      Result Value Range   Cholesterol 187  0 - 200 mg/dL   Triglycerides 784  <696 mg/dL   HDL 42  >29 mg/dL   Total CHOL/HDL Ratio 4.5     VLDL 29  0 - 40 mg/dL   LDL Cholesterol 528 (*) 0 - 99 mg/dL   Comment:            Total Cholesterol/HDL:CHD Risk     Coronary Heart Disease Risk Table                         Men   Women      1/2  Average Risk   3.4   3.3      Average Risk       5.0   4.4      2 X Average Risk   9.6   7.1      3 X Average Risk  23.4   11.0                Use the calculated Patient Ratio     above and the CHD Risk Table     to determine the patient's CHD Risk.                ATP III CLASSIFICATION (LDL):      <100     mg/dL   Optimal      161-096  mg/dL   Near or Above                        Optimal      130-159  mg/dL   Borderline      045-409  mg/dL   High      >811     mg/dL   Very High  GLUCOSE, CAPILLARY     Status: Abnormal   Collection Time    03/23/13  7:49 AM      Result Value Range   Glucose-Capillary 122 (*) 70 - 99 mg/dL   Comment 1 Notify RN      Dg Chest 1 View  03/22/2013   *RADIOLOGY REPORT*  Clinical Data: Cough  CHEST - 1 VIEW   Comparison: 06/30/2012  Findings: The cardiac shadow is stable.  The lungs are clear bilaterally.  No acute bony abnormality is seen.  IMPRESSION: No acute abnormality noted.   Original Report Authenticated By: Alcide Clever, M.D.   Ct Head Wo Contrast  03/22/2013   *RADIOLOGY REPORT*  Clinical Data: Dysphagia.  Aphasia.  History of stroke.  CT HEAD WITHOUT CONTRAST  Technique:  Contiguous axial images were obtained from the base of the skull through the vertex without contrast.  Comparison: None.  Findings: The ventricles are normal in overall configuration. There is mild ex vacuo dilation of the frontal horn of the left lateral ventricle from old adjacent deep white matter infarct. There is generalized ventricular and sulcal enlargement reflecting age related volume loss.  There are no parenchymal masses or mass effect.  There is a small lacunar infarct in the central left frontal lobe white matter and in the right internal capsule, posterior limb.  Patchy areas of white matter hypoattenuation are also noted consistent with moderate chronic microvascular ischemic change.  There is no evidence of a recent transcortical infarct.  There are no extra-axial masses or abnormal fluid collections.  There is no intracranial hemorrhage.  The visualized sinuses and the mastoid air cells are clear.  IMPRESSION: No acute intracranial abnormalities.  Age related volume loss, old infarcts and moderate chronic microvascular ischemic change.   Original Report Authenticated By: Amie Portland, M.D.   Mr Brain Wo Contrast  03/22/2013   *RADIOLOGY REPORT*  Clinical Data: Dysphagia and aphasia for 3 days.  MRI HEAD WITHOUT CONTRAST  Technique:  Multiplanar, multiecho pulse sequences of the brain and surrounding structures were obtained according to standard protocol without intravenous contrast.  Comparison: 03/22/2013 head CT.  No comparison brain MR.  Findings: Motion degraded exam. Sequences were repeated.  Acute non hemorrhagic  left frontal - parietal lobe infarcts.  Remote left caudate/lenticular nucleus infarct which was partially hemorrhagic  containing blood breakdown products otherwise no evidence intracranial hemorrhage.  Remote thalamic and basal ganglia infarcts.  Marked small vessel disease type changes.  Global atrophy without hydrocephalus.  Abnormal appearance of the right internal carotid artery which may be the significantly narrowed.  IMPRESSION: Motion degraded exam. Sequences were repeated.  Acute non hemorrhagic left frontal - parietal lobe infarcts.  Remote left caudate/lenticular nucleus infarct which was partially hemorrhagic containing blood breakdown products otherwise no evidence intracranial hemorrhage.  Remote thalamic and basal ganglia infarcts.  Marked small vessel disease type changes.  Global atrophy without hydrocephalus.  Abnormal appearance of the right internal carotid artery which may be significantly narrowed.  Critical Value/emergent results were called by telephone at the time of interpretation on 03/22/2013 at 3:58 p.m. to  Dr. Lynelle Doctor., who verbally acknowledged these results.   Original Report Authenticated By: Lacy Duverney, M.D.       Leanord Thibeau A. Christine Durham, M.D.  Diplomate, Biomedical engineer of Psychiatry and Neurology ( Neurology). 03/23/2013, 8:47 AM

## 2013-03-23 NOTE — Evaluation (Signed)
Physical Therapy Evaluation Patient Details Name: Christine Durham MRN: [id number] DOB: 07-07-1941 Today's Date: 03/23/2013 Time: 4098-1191 PT Time Calculation (min): 30 min  PT Assessment / Plan / Recommendation History of Present Illness  Pt has had a stoke with left hemiparesis in 2011.  She has been ambulating with a walker and doing well up until recently where she has had 2 falls.  The last fall a week ago caused an L1 compression fracture.  Since then, her mobility has been significantly limited by pain. She has been able to ambulate only a few feet at a time.  She is now admitted with another stroke.  Clinical Impression   Pt was seen for an evaluation.  She is now alert and cooperative, appears to be dysarthric.  She is able to follow all directions.  Her major problem as far as mobility is concerned is her compression fx.  As far as I can tell, her strength, balance and coordination are uninvolved from the stroke just sustained.  Her sister was present and saw no deviation now v.s. just prior to her stoke (and after her compression fx).  However, her mobility is severely limited by pain from her compression fx and I am recommending SNF at d/c since she functioned at a reasonably high level prior to these recent problems.  Pt and sister are in agreement.  A CASH brace was not ordered for her compression fx when she was seen in the ED.  This might be considered by MD if appropriate.    PT Assessment  Patient needs continued PT services    Follow Up Recommendations  SNF    Does the patient have the potential to tolerate intense rehabilitation    no  Barriers to Discharge        Equipment Recommendations  None recommended by PT    Recommendations for Other Services     Frequency Min 5X/week    Precautions / Restrictions Precautions Precautions: Fall Restrictions Weight Bearing Restrictions: No   Pertinent Vitals/Pain       Mobility  Bed Mobility Bed Mobility: Supine to  Sit;Sit to Supine Supine to Sit: 3: Mod assist;HOB elevated Sit to Supine: 4: Min assist;HOB flat Transfers Transfers: Sit to Stand;Stand to Sit Sit to Stand: 4: Min guard;From bed;With upper extremity assist Stand to Sit: 5: Supervision;With upper extremity assist;To bed Ambulation/Gait Ambulation/Gait Assistance: 4: Min assist Ambulation Distance (Feet): 3 Feet Assistive device: Rolling walker Gait Pattern: Decreased step length - right;Decreased step length - left;Decreased hip/knee flexion - left;Decreased hip/knee flexion - right;Decreased trunk rotation Gait velocity: gait is slow and labored General Gait Details: per family, gait is not much changed from pre stroke Stairs: No Wheelchair Mobility Wheelchair Mobility: No    Exercises     PT Diagnosis: Difficulty walking;Abnormality of gait;Hemiplegia non-dominant side;Acute pain  PT Problem List: Decreased strength;Decreased activity tolerance;Decreased mobility;Pain PT Treatment Interventions: Gait training;Functional mobility training;Therapeutic exercise     PT Goals(Current goals can be found in the care plan section) Acute Rehab PT Goals Patient Stated Goal: none stated PT Goal Formulation: With patient/family Time For Goal Achievement: 04/06/13 Potential to Achieve Goals: Good  Visit Information  Last PT Received On: 03/23/13 History of Present Illness: Pt has had a stoke with left hemiparesis in 2011.  She has been ambulating with a walker and doing well up until recently where she has had 2 falls.  The last fall a week ago caused an L1 compression fracture.  Since then, her mobility  has been significantly limited by pain. She has been able to ambulate only a few feet at a time.  She is now admitted with another stroke.       Prior Functioning  Prior Function Level of Independence: Independent with assistive device(s) Communication Communication: Expressive difficulties (appears to have good receptive ability)     Cognition  Cognition Arousal/Alertness: Awake/alert Behavior During Therapy: WFL for tasks assessed/performed Overall Cognitive Status: Within Functional Limits for tasks assessed    Extremity/Trunk Assessment Upper Extremity Assessment Upper Extremity Assessment: Defer to OT evaluation Lower Extremity Assessment Lower Extremity Assessment: LLE deficits/detail LLE Deficits / Details: pt has 3/5 strength in major muscle groups, essentially unchanged from baseline....she wears an AFO for gait as her left DF tires quickly while walking (according to her sister)   Balance    End of Session PT - End of Session Equipment Utilized During Treatment: Gait belt Activity Tolerance: Patient limited by pain Patient left: in bed;with family/visitor present;with call bell/phone within reach;with bed alarm set  GP     [name] 03/23/2013, 5:32 PM

## 2013-03-23 NOTE — Progress Notes (Signed)
SLP Cancellation Note  Patient Details Name: Christine Durham MRN: [id number] DOB: 01-05-1941   Cancelled treatment:       Reason Eval/Treat Not Completed: Fatigue/lethargy limiting ability to participate (Pt with continued effects from Ativan given for MRI)  Chart reviewed and pt will need MBSS given progressive difficulties with swallowing. Currently, pt is sedated (Ativan from MRI). Will attempt BSE and/or MBSS later today pending alertness.  Thank you,  Havery Moros, CCC-SLP [phone]  Erykah Lippert 03/23/2013, 9:32 AM

## 2013-03-23 NOTE — Progress Notes (Signed)
PT Cancellation Note  Patient Details Name: Christine Durham MRN: [id number] DOB: [birth year]   Cancelled Treatment:    Reason Eval/Treat Not Completed: Patient's level of consciousness Pt is unable to awaken for more than a few seconds at a time.  We will try again later today if possible.  [name] 03/23/2013, 11:06 AM

## 2013-03-23 NOTE — Progress Notes (Signed)
[facility] PROGRESS NOTE  Braniya Farrugia ZOX:[id number] DOB: [birth year] DOA: 03/22/2013 PCP: Ignatius Specking., MD  Assessment/Plan: #1 acute stroke with dysphagia: somewhat obtunded this am likely related to ativan given pre MRI. She is arousable and will follow commands.  Swallow evaluation delayed due to this as well as PT evaluation. Will continue aspirin rectally for now. Appreciate Neurology assistance and plavix will be started. Await results of carotid doppler and  Echocardiogram.   #2 history of nonischemic cardiomyopathy: She had an EF of 25% based on a cardiac catheterization in December of 2013. She did not have any obstructive CAD. She hasn't had any followup since then. Await results of  echocardiogram.   #3 recent lumbar compression fractures: Continue with pain control. She does not have any neurological weakness in the lower extremities except that which is chronic. PT and OT evaluation will also be obtained.   #4 diabetes, type II: HbA1c pending. She remains NPO as swallow eval delayed due to sedation. We will continue SSI   #5 history of COPD: Continue to monitor. Chest xray unremarkable but pt with weak wet cough during exam. Suspect aspiration prior to admission. Continue aspiration precautions.    #6 Possible UTI with abnormal, UA: rocephin day #2.       Code Status: full Family Communication: none available Disposition Plan: home when ready   Consultants:  neurology  Procedures:  none  Antibiotics: Rocephin 03/22/13>>> HPI/Subjective: Lethargic. Awakens to moderate touch and voice. Follows commands.   Objective: Filed Vitals:   03/23/13 0428  BP: 158/80  Pulse: 93  Temp: 98.8 F (37.1 C)  Resp: 20   No intake or output data in the 24 hours ending 03/23/13 1127 Filed Weights   03/22/13 1126 03/22/13 1929  Weight: 55.339 kg (122 lb) 57.698 kg (127 lb 3.2 oz)    Exam:   General:  Thin frail NAD  Cardiovascular: RRR No MGR No LE  edema  Respiratory: normal effort somewhat shallow BS distant somewhat coarse. Wet non-productive cough no crackles  Abdomen: flat soft +BS non-tender to palpation  Musculoskeletal: MAE right grip 5/5 left grip 4/5. LE strength bilateral 4/5.   Neuro: lethargic attempts to follow commands. Nods to questions   Data Reviewed: Basic Metabolic Panel:  Recent Labs Lab 03/22/13 1248 03/23/13 0548  NA 135 134*  K 4.0 3.8  CL 97 96  CO2 26 23  GLUCOSE 152* 124*  BUN 18 15  CREATININE 0.73 0.66  CALCIUM 10.5 10.3   Liver Function Tests:  Recent Labs Lab 03/22/13 1248 03/23/13 0548  AST 19 18  ALT 14 13  ALKPHOS 127* 124*  BILITOT 0.3 0.3  PROT 8.0 7.8  ALBUMIN 3.9 3.6   No results found for this basename: LIPASE, AMYLASE,  in the last 168 hours No results found for this basename: AMMONIA,  in the last 168 hours CBC:  Recent Labs Lab 03/22/13 1248 03/23/13 0548  WBC 12.7* 10.2  NEUTROABS 9.5*  --   HGB 13.0 13.4  HCT 37.9 39.9  MCV 89.0 88.5  PLT 306 278   Cardiac Enzymes:  Recent Labs Lab 03/22/13 1248  TROPONINI <0.30   BNP (last 3 results)  Recent Labs  06/26/12 2320  PROBNP 150.2*   CBG:  Recent Labs Lab 03/22/13 1208 03/22/13 2125 03/23/13 0003 03/23/13 0426 03/23/13 0749  GLUCAP 145* 115* 130* 96 122*    No results found for this or any previous visit (from the past 240 hour(s)).   Studies: Dg  Chest 1 View  03/22/2013   *RADIOLOGY REPORT*  Clinical Data: Cough  CHEST - 1 VIEW  Comparison: 06/30/2012  Findings: The cardiac shadow is stable.  The lungs are clear bilaterally.  No acute bony abnormality is seen.  IMPRESSION: No acute abnormality noted.   Original Report Authenticated By: Alcide Clever, M.D.   Ct Head Wo Contrast  03/22/2013   *RADIOLOGY REPORT*  Clinical Data: Dysphagia.  Aphasia.  History of stroke.  CT HEAD WITHOUT CONTRAST  Technique:  Contiguous axial images were obtained from the base of the skull through the vertex  without contrast.  Comparison: None.  Findings: The ventricles are normal in overall configuration. There is mild ex vacuo dilation of the frontal horn of the left lateral ventricle from old adjacent deep white matter infarct. There is generalized ventricular and sulcal enlargement reflecting age related volume loss.  There are no parenchymal masses or mass effect.  There is a small lacunar infarct in the central left frontal lobe white matter and in the right internal capsule, posterior limb.  Patchy areas of white matter hypoattenuation are also noted consistent with moderate chronic microvascular ischemic change.  There is no evidence of a recent transcortical infarct.  There are no extra-axial masses or abnormal fluid collections.  There is no intracranial hemorrhage.  The visualized sinuses and the mastoid air cells are clear.  IMPRESSION: No acute intracranial abnormalities.  Age related volume loss, old infarcts and moderate chronic microvascular ischemic change.   Original Report Authenticated By: Amie Portland, M.D.   [facility] Wo Contrast  03/23/2013   *RADIOLOGY REPORT*  Clinical Data: Acute left MCA territory infarcts.  MRA HEAD WITHOUT CONTRAST  Technique: Angiographic images of the Circle of Willis were obtained using MRA technique without intravenous contrast.  Comparison: MRI brain 03/22/2013.  Findings: There is significant asymmetry of signal in the internal carotid arteries.  Irregularity suggests atherosclerotic changes within the right cavernous carotid artery without a significant stenosis.  There is significant signal loss within the left petrous internal carotid artery and the left cavernous internal carotid artery.  Decreased signal is evident in the left A1 segment compared the right.  Signal loss in the A1 segments anteriorly on both sides is likely artifactual.  The right M1 segments demonstrates relatively normal signal although there is significant signal loss within the proximal  branch vessels on the right.  There is complete signal loss in the distal left M1 segment with some reconstitution with some demonstration of flow and more distal left MCA branch vessels.  Moderate stenoses are present in the proximal vertebral arteries bilaterally.  The vertebral basilar junction is within normal limits.  The basilar artery is within normal limits.  Both posterior cerebral arteries originate from basilar tip.  There is moderate stenosis in the proximal right P1 segment.  The distal branch vessels are not well seen.  IMPRESSION:  1.  Significant asymmetric signal in the internal carotid arteries bilaterally.  While there may be stenoses within the left petrous and cavernous internal carotid arteries, I suspect this is secondary to a more proximal stenosis, likely at the carotid bifurcation. 2.  The study is moderately degraded by patient motion. 3.  Significant signal loss and distal vessels suggesting moderate small vessel disease. 4.  Moderate vertebral artery stenoses bilaterally. 5.  Moderate proximal right posterior cerebral artery stenosis. 6.  Probable right A1 segment stenosis in addition to the more proximal stenoses.   Original Report Authenticated By: Marin Roberts, M.D.  Mr Brain Wo Contrast  03/22/2013   *RADIOLOGY REPORT*  Clinical Data: Dysphagia and aphasia for 3 days.  MRI HEAD WITHOUT CONTRAST  Technique:  Multiplanar, multiecho pulse sequences of the brain and surrounding structures were obtained according to standard protocol without intravenous contrast.  Comparison: 03/22/2013 head CT.  No comparison brain MR.  Findings: Motion degraded exam. Sequences were repeated.  Acute non hemorrhagic left frontal - parietal lobe infarcts.  Remote left caudate/lenticular nucleus infarct which was partially hemorrhagic containing blood breakdown products otherwise no evidence intracranial hemorrhage.  Remote thalamic and basal ganglia infarcts.  Marked small vessel disease type  changes.  Global atrophy without hydrocephalus.  Abnormal appearance of the right internal carotid artery which may be the significantly narrowed.  IMPRESSION: Motion degraded exam. Sequences were repeated.  Acute non hemorrhagic left frontal - parietal lobe infarcts.  Remote left caudate/lenticular nucleus infarct which was partially hemorrhagic containing blood breakdown products otherwise no evidence intracranial hemorrhage.  Remote thalamic and basal ganglia infarcts.  Marked small vessel disease type changes.  Global atrophy without hydrocephalus.  Abnormal appearance of the right internal carotid artery which may be significantly narrowed.  Critical Value/emergent results were called by telephone at the time of interpretation on 03/22/2013 at 3:58 p.m. to  Dr. Lynelle Doctor., who verbally acknowledged these results.   Original Report Authenticated By: Lacy Duverney, M.D.    Scheduled Meds: . aspirin  300 mg Rectal Daily   Or  . aspirin  325 mg Oral Daily  . cefTRIAXone (ROCEPHIN)  IV  1 g Intravenous Q24H  . [START ON 03/24/2013] clopidogrel  75 mg Oral Q breakfast  . enoxaparin (LOVENOX) injection  40 mg Subcutaneous Q24H  . insulin aspart  0-9 Units Subcutaneous Q4H  . sodium chloride  3 mL Intravenous Q12H   Continuous Infusions:   Principal Problem:   Acute ischemic stroke Active Problems:   Diabetes mellitus without complication   NICM (nonischemic cardiomyopathy)   Tobacco abuse   COPD (chronic obstructive pulmonary disease)   Dysphagia   UTI (urinary tract infection)    Time spent: 30 minutes    [facility] M  [facility] Pager [phone]. If 7PM-7AM, please contact night-coverage at [email or web address], password [facility] 03/23/2013, 11:27 AM  LOS: 1 day

## 2013-03-23 NOTE — Evaluation (Signed)
Clinical/Bedside Swallow Evaluation  Patient Details  Name: Christine Durham MRN: [id number] Date of Birth: 1940-08-03  Today's Date: 03/23/2013 Time: 1140-1210 SLP Time Calculation (min): 30 min  Past Medical History:  Past Medical History  Diagnosis Date  . 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 Diabetes mellitus without complication   . Asthma   . HTN (hypertension)   . Tobacco abuse   . 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 Appendectomy     HPI:  72 yo female with 3 day history of acute dysphagia and dysarthria. MRI shows: Acute non hemorrhagic left frontal - parietal lobe infarcts.  Remote left caudate/lenticular nucleus infarct which was partially hemorrhagic containing blood breakdown products otherwise no evidence intracranial hemorrhage.  Remote thalamic and basal ganglia infarcts.  Marked small vessel disease type changes.  Global atrophy without hydrocephalus.  Abnormal appearance of the right internal carotid artery which may be significantly narrowed.   Assessment / Plan / Recommendation Clinical Impression  Christine Durham presents with functionally mod/severe dysphagia at this time in setting of acute CVA and sedation from Ativan given this morning for MRI. She is able to follow commands and attempts to do what is requested. Speech is dysarthric and pt has audible upper airway secretions. Pt has difficulty managing her secretions at times. Pt willingly accepts po presentation on spoon, however has difficulty with anterior spillage and anterior-posterior transit of bolus. Suspect delay in swallow initiation all resulting in delayed strong coughing episodes of suspected penetration and/or aspiration of residuals. Pt is not yet alert enough for MBSS, as I think (hope) that swallow function will improve as alertness improves. Recommend continued NPO including meds with re-assessment tomorrow. Above to  nurse, Christine Durham and Christine Smothers, NP.     Aspiration Risk  Severe    Diet Recommendation NPO        Other  Recommendations Recommended Consults: MBS (when appropriate) Oral Care Recommendations: Oral care QID;Staff/trained caregiver to provide oral care   Follow Up Recommendations  Skilled Nursing facility    Frequency and Duration min 3x week  1 week       SLP Swallow Goals  Pending reassessment of swallow   Swallow Study Prior Functional Status   Pt lived with sister and was able to ambulate with a walker, past medical history significant for CVA    General Date of Onset: 03/20/13 HPI: 72 yo female with 3 day history of acute dysphagia and dysarthria. MRI shows: Acute non hemorrhagic left frontal - parietal lobe infarcts.  Remote left caudate/lenticular nucleus infarct which was partially hemorrhagic containing blood breakdown products otherwise no evidence intracranial hemorrhage.  Remote thalamic and basal ganglia infarcts.  Marked small vessel disease type changes.  Global atrophy without hydrocephalus.  Abnormal appearance of the right internal carotid artery which may be significantly narrowed. Type of Study: Bedside swallow evaluation Previous Swallow Assessment: None on record Diet Prior to this Study: NPO Temperature Spikes Noted: No Respiratory Status: Room air History of Recent Intubation: No Behavior/Cognition: Alert;Cooperative;Decreased sustained attention;Requires cueing;Distractible Oral Cavity - Dentition: Poor condition Self-Feeding Abilities: Total assist Patient Positioning: Upright in bed Baseline Vocal Quality: Wet;Low vocal intensity Volitional Cough: Weak Volitional Swallow: Able to elicit (delayed)    Oral/Motor/Sensory Function Overall Oral Motor/Sensory Function: Impaired Labial ROM: Within Functional Limits Labial Symmetry: Within Functional Limits Labial Strength: Reduced Labial Sensation: Within Functional Limits Lingual ROM:  Reduced  right Lingual Symmetry: Within Functional Limits Lingual Strength: Reduced Facial ROM: Within Functional Limits Facial Symmetry: Within Functional Limits Mandible: Within Functional Limits   Ice Chips Ice chips: Impaired Presentation: Spoon Oral Phase Impairments: Reduced labial seal;Reduced lingual movement/coordination Oral Phase Functional Implications: Right anterior spillage Pharyngeal Phase Impairments: Suspected delayed Swallow;Wet Vocal Quality;Cough - Delayed   Thin Liquid Thin Liquid: Not tested    Nectar Thick Nectar Thick Liquid: Impaired Presentation: Spoon Oral Phase Impairments: Reduced labial seal;Reduced lingual movement/coordination;Impaired anterior to posterior transit Oral phase functional implications: Oral holding Pharyngeal Phase Impairments: Cough - Delayed;Suspected delayed Swallow;Wet Vocal Quality   Honey Thick Honey Thick Liquid: Not tested   Puree Puree: Impaired Presentation: Spoon Oral Phase Impairments: Reduced lingual movement/coordination;Impaired anterior to posterior transit Oral Phase Functional Implications: Prolonged oral transit;Oral holding Pharyngeal Phase Impairments: Suspected delayed Swallow;Cough - Delayed   Solid   GO    Solid: Not tested      Thank you,  Havery Moros, CCC-SLP [phone]  Zamere Pasternak 03/23/2013,3:02 PM

## 2013-03-24 ENCOUNTER — Inpatient Hospital Stay (HOSPITAL_COMMUNITY): Payer: Medicare Other

## 2013-03-24 LAB — GLUCOSE, CAPILLARY
Glucose-Capillary: 100 mg/dL — ABNORMAL HIGH (ref 70–99)
Glucose-Capillary: 106 mg/dL — ABNORMAL HIGH (ref 70–99)
Glucose-Capillary: 110 mg/dL — ABNORMAL HIGH (ref 70–99)
Glucose-Capillary: 178 mg/dL — ABNORMAL HIGH (ref 70–99)
Glucose-Capillary: 67 mg/dL — ABNORMAL LOW (ref 70–99)

## 2013-03-24 LAB — URINE CULTURE: Colony Count: >=100000

## 2013-03-24 MED ORDER — ATORVASTATIN CALCIUM 20 MG PO TABS
20.0000 mg | ORAL_TABLET | Freq: Every day | ORAL | Status: DC
  Administered 2013-03-24: 20 mg via ORAL
  Filled 2013-03-24: qty 1

## 2013-03-24 MED ORDER — SIMVASTATIN 20 MG PO TABS: 20.0000 mg | ORAL_TABLET | Freq: Every day | ORAL | Status: DC

## 2013-03-24 MED ORDER — MORPHINE SULFATE 2 MG/ML IJ SOLN
1.0000 mg | INTRAMUSCULAR | Status: DC | PRN
  Administered 2013-03-24 – 2013-03-25 (×2): 1 mg via INTRAVENOUS
  Filled 2013-03-24 (×2): qty 1

## 2013-03-24 NOTE — Progress Notes (Signed)
Patient ID: Christine Durham, female   DOB: 08/24/1940, 72 y.o.   MRN: [id number]  [facility] NEUROLOGY Abygale Karpf A. Gerilyn Pilgrim, MD     [email or web address]          Christine Durham is an 72 y.o. female.   Assessment/Plan: 1. Acute left frontal infarct. The patient has severe dysarthria and dysphasia. A modified barium swallow is going to be done to assess safety for the patient and recommended treatment. The patient again should be placed on dual antiplatelet agents given the severe intracranial occlusive disease. Again, recommended dual antiplatelet therapy done for 3 months and subsequently the patient should be placed on a single antiplatelet agent. She should continue with statin medications and other risk factor modifications including diabetes and hypertensive control. 2. Likely symptomatic high-grade left ICA stenosis. The patient should have a urgent vascular consultation within the next week or 2. Posterior surgery be done shortly afterwards. 3. 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  The patient reports no new complaints today. She is a lot more alert and with it. Today she had Ativan for the brain MRA and was quite encephalopathic throughout most of the day.  She is awake and alert. She is oriented to year, hospital, and medical condition. She has a severe dysarthria. She follows commands well. Facial muscle strength is symmetric. Right triceps 4 minus/5. Handgrip 5. Left triceps also 4 minus and the left handgrip 5. Left hip flexion 4 minus and right hip flexion 5. Left dorsiflexion 4 minus and a right dorsiflexion 4 minus.   Objective: Vital signs in last 24 hours: Temp:  [97.3 F (36.3 C)-98.9 F (37.2 C)] 97.3 F (36.3 C) (09/12 0432) Pulse Rate:  [80-97] 90 (09/12 0432) Resp:  [20] 20 (09/12 0432) BP: (138-154)/(74-83) 148/83 mmHg (09/12 0432) SpO2:  [94 %-96 %] 96 % (09/12 0432) Weight:  [58 kg (127 lb 13.9 oz)] 58  kg (127 lb 13.9 oz) (09/12 0432)  Intake/Output from previous day: 09/11 0701 - 09/12 0700 In: 660 [I.V.:610; IV Piggyback:50] Out: -  Intake/Output this shift:   Nutritional status: NPO   Lab Results: Results for orders placed during the hospital encounter of 03/22/13 (from the past 48 hour(s))  GLUCOSE, CAPILLARY     Status: Abnormal   Collection Time    03/22/13 12:08 PM      Result Value Range   Glucose-Capillary 145 (*) 70 - 99 mg/dL  CBC WITH DIFFERENTIAL     Status: Abnormal   Collection Time    03/22/13 12:48 PM      Result Value Range   WBC 12.7 (*) 4.0 - 10.5 K/uL   RBC 4.26  3.87 - 5.11 MIL/uL   Hemoglobin 13.0  12.0 - 15.0 g/dL   HCT 29.5  62.1 - 30.8 %   MCV 89.0  78.0 - 100.0 fL   MCH 30.5  26.0 - 34.0 pg   MCHC 34.3  30.0 - 36.0 g/dL   RDW 65.7  84.6 - 96.2 %   Platelets 306  150 - 400 K/uL   Neutrophils Relative % 75  43 - 77 %   Neutro Abs 9.5 (*) 1.7 - 7.7 K/uL   Lymphocytes Relative 19  12 - 46 %   Lymphs Abs 2.4  0.7 - 4.0 K/uL   Monocytes Relative 5  3 - 12 %   Monocytes Absolute 0.6  0.1 - 1.0 K/uL   Eosinophils Relative 1  0 - 5 %  Eosinophils Absolute 0.2  0.0 - 0.7 K/uL   Basophils Relative 0  0 - 1 %   Basophils Absolute 0.0  0.0 - 0.1 K/uL  COMPREHENSIVE METABOLIC PANEL     Status: Abnormal   Collection Time    03/22/13 12:48 PM      Result Value Range   Sodium 135  135 - 145 mEq/L   Potassium 4.0  3.5 - 5.1 mEq/L   Chloride 97  96 - 112 mEq/L   CO2 26  19 - 32 mEq/L   Glucose, Bld 152 (*) 70 - 99 mg/dL   BUN 18  6 - 23 mg/dL   Creatinine, Ser 0.98  0.50 - 1.10 mg/dL   Calcium 11.9  8.4 - 14.7 mg/dL   Total Protein 8.0  6.0 - 8.3 g/dL   Albumin 3.9  3.5 - 5.2 g/dL   AST 19  0 - 37 U/L   ALT 14  0 - 35 U/L   Alkaline Phosphatase 127 (*) 39 - 117 U/L   Total Bilirubin 0.3  0.3 - 1.2 mg/dL   GFR calc non Af Amer 85 (*) >90 mL/min   GFR calc Af Amer >90  >90 mL/min   Comment: (NOTE)     The eGFR has been calculated using the CKD  EPI equation.     This calculation has not been validated in all clinical situations.     eGFR's persistently <90 mL/min signify possible Chronic Kidney     Disease.  APTT     Status: None   Collection Time    03/22/13 12:48 PM      Result Value Range   aPTT 30  24 - 37 seconds  PROTIME-INR     Status: None   Collection Time    03/22/13 12:48 PM      Result Value Range   Prothrombin Time 13.3  11.6 - 15.2 seconds   INR 1.03  0.00 - 1.49  TROPONIN I     Status: None   Collection Time    03/22/13 12:48 PM      Result Value Range   Troponin I <0.30  <0.30 ng/mL   Comment:            Due to the release kinetics of cTnI,     a negative result within the first hours     of the onset of symptoms does not rule out     myocardial infarction with certainty.     If myocardial infarction is still suspected,     repeat the test at appropriate intervals.  HEMOGLOBIN A1C     Status: Abnormal   Collection Time    03/22/13 12:48 PM      Result Value Range   Hemoglobin A1C 8.0 (*) <5.7 %   Comment: (NOTE)                                                                               According to the ADA Clinical Practice Recommendations for 2011, when     HbA1c is used as a screening test:      >=6.5%   Diagnostic of Diabetes Mellitus               (  if abnormal result is confirmed)     5.7-6.4%   Increased risk of developing Diabetes Mellitus     References:Diagnosis and Classification of Diabetes Mellitus,Diabetes     Care,2011,34(Suppl 1):S62-S69 and Standards of Medical Care in             Diabetes - 2011,Diabetes Care,2011,34 (Suppl 1):S11-S61.   Mean Plasma Glucose 183 (*) <117 mg/dL   Comment: Performed at [facility]  URINALYSIS, ROUTINE W REFLEX MICROSCOPIC     Status: Abnormal   Collection Time    03/22/13  4:10 PM      Result Value Range   Color, Urine YELLOW  YELLOW   APPearance CLOUDY (*) CLEAR   Specific Gravity, Urine 1.025  1.005 - 1.030   pH 6.0  5.0 - 8.0    Glucose, UA NEGATIVE  NEGATIVE mg/dL   Hgb urine dipstick TRACE (*) NEGATIVE   Bilirubin Urine NEGATIVE  NEGATIVE   Ketones, ur 15 (*) NEGATIVE mg/dL   Protein, ur NEGATIVE  NEGATIVE mg/dL   Urobilinogen, UA 0.2  0.0 - 1.0 mg/dL   Nitrite NEGATIVE  NEGATIVE   Leukocytes, UA MODERATE (*) NEGATIVE  URINE MICROSCOPIC-ADD ON     Status: Abnormal   Collection Time    03/22/13  4:10 PM      Result Value Range   Squamous Epithelial / LPF FEW (*) RARE   WBC, UA TOO NUMEROUS TO COUNT  <3 WBC/hpf   RBC / HPF 0-2  <3 RBC/hpf   Bacteria, UA MANY (*) RARE  URINE CULTURE     Status: None   Collection Time    03/22/13  4:10 PM      Result Value Range   Specimen Description URINE, CATHETERIZED     Special Requests NONE     Culture  Setup Time       Value: 03/22/2013 18:50     Performed at [facility] Count       Value: >=100,000 COLONIES/ML     Performed at [facility]   Culture       Value: GRAM NEGATIVE RODS     Performed at [facility]   Report Status PENDING    GLUCOSE, CAPILLARY     Status: Abnormal   Collection Time    03/22/13  9:25 PM      Result Value Range   Glucose-Capillary 115 (*) 70 - 99 mg/dL   Comment 1 Notify RN    GLUCOSE, CAPILLARY     Status: Abnormal   Collection Time    03/23/13 12:03 AM      Result Value Range   Glucose-Capillary 130 (*) 70 - 99 mg/dL   Comment 1 Notify RN    GLUCOSE, CAPILLARY     Status: None   Collection Time    03/23/13  4:26 AM      Result Value Range   Glucose-Capillary 96  70 - 99 mg/dL   Comment 1 Notify RN    COMPREHENSIVE METABOLIC PANEL     Status: Abnormal   Collection Time    03/23/13  5:48 AM      Result Value Range   Sodium 134 (*) 135 - 145 mEq/L   Potassium 3.8  3.5 - 5.1 mEq/L   Chloride 96  96 - 112 mEq/L   CO2 23  19 - 32 mEq/L   Glucose, Bld 124 (*) 70 - 99 mg/dL   BUN 15  6 - 23  mg/dL   Creatinine, Ser 4.09  0.50 - 1.10 mg/dL   Calcium 81.1  8.4 - 91.4 mg/dL   Total Protein  7.8  6.0 - 8.3 g/dL   Albumin 3.6  3.5 - 5.2 g/dL   AST 18  0 - 37 U/L   ALT 13  0 - 35 U/L   Alkaline Phosphatase 124 (*) 39 - 117 U/L   Total Bilirubin 0.3  0.3 - 1.2 mg/dL   GFR calc non Af Amer 87 (*) >90 mL/min   GFR calc Af Amer >90  >90 mL/min   Comment: (NOTE)     The eGFR has been calculated using the CKD EPI equation.     This calculation has not been validated in all clinical situations.     eGFR's persistently <90 mL/min signify possible Chronic Kidney     Disease.  CBC     Status: None   Collection Time    03/23/13  5:48 AM      Result Value Range   WBC 10.2  4.0 - 10.5 K/uL   RBC 4.51  3.87 - 5.11 MIL/uL   Hemoglobin 13.4  12.0 - 15.0 g/dL   HCT 78.2  95.6 - 21.3 %   MCV 88.5  78.0 - 100.0 fL   MCH 29.7  26.0 - 34.0 pg   MCHC 33.6  30.0 - 36.0 g/dL   RDW 08.6  57.8 - 46.9 %   Platelets 278  150 - 400 K/uL  LIPID PANEL     Status: Abnormal   Collection Time    03/23/13  5:48 AM      Result Value Range   Cholesterol 187  0 - 200 mg/dL   Triglycerides 629  <528 mg/dL   HDL 42  >41 mg/dL   Total CHOL/HDL Ratio 4.5     VLDL 29  0 - 40 mg/dL   LDL Cholesterol 324 (*) 0 - 99 mg/dL   Comment:            Total Cholesterol/HDL:CHD Risk     Coronary Heart Disease Risk Table                         Men   Women      1/2 Average Risk   3.4   3.3      Average Risk       5.0   4.4      2 X Average Risk   9.6   7.1      3 X Average Risk  23.4   11.0                Use the calculated Patient Ratio     above and the CHD Risk Table     to determine the patient's CHD Risk.                ATP III CLASSIFICATION (LDL):      <100     mg/dL   Optimal      401-027  mg/dL   Near or Above                        Optimal      130-159  mg/dL   Borderline      253-664  mg/dL   High      >403     mg/dL   Very High  GLUCOSE, CAPILLARY  Status: Abnormal   Collection Time    03/23/13  7:49 AM      Result Value Range   Glucose-Capillary 122 (*) 70 - 99 mg/dL   Comment 1  Notify RN    GLUCOSE, CAPILLARY     Status: Abnormal   Collection Time    03/23/13 11:40 AM      Result Value Range   Glucose-Capillary 125 (*) 70 - 99 mg/dL   Comment 1 Notify RN    GLUCOSE, CAPILLARY     Status: Abnormal   Collection Time    03/23/13  4:50 PM      Result Value Range   Glucose-Capillary 69 (*) 70 - 99 mg/dL   Comment 1 Notify RN    GLUCOSE, CAPILLARY     Status: None   Collection Time    03/23/13  7:54 PM      Result Value Range   Glucose-Capillary 80  70 - 99 mg/dL  GLUCOSE, CAPILLARY     Status: Abnormal   Collection Time    03/24/13 12:10 AM      Result Value Range   Glucose-Capillary 106 (*) 70 - 99 mg/dL   Comment 1 Notify RN    GLUCOSE, CAPILLARY     Status: None   Collection Time    03/24/13  4:29 AM      Result Value Range   Glucose-Capillary 96  70 - 99 mg/dL   Comment 1 Notify RN    GLUCOSE, CAPILLARY     Status: Abnormal   Collection Time    03/24/13  7:28 AM      Result Value Range   Glucose-Capillary 100 (*) 70 - 99 mg/dL   Comment 1 Notify RN     Comment 2 Documented in Chart      Lipid Panel  Recent Labs  03/23/13 0548  CHOL 187  TRIG 145  HDL 42  CHOLHDL 4.5  VLDL 29  LDLCALC 161*    Studies/Results: Dg Chest 1 View  03/22/2013   *RADIOLOGY REPORT*  Clinical Data: Cough  CHEST - 1 VIEW  Comparison: 06/30/2012  Findings: The cardiac shadow is stable.  The lungs are clear bilaterally.  No acute bony abnormality is seen.  IMPRESSION: No acute abnormality noted.   Original Report Authenticated By: Alcide Clever, M.D.   Ct Head Wo Contrast  03/22/2013   *RADIOLOGY REPORT*  Clinical Data: Dysphagia.  Aphasia.  History of stroke.  CT HEAD WITHOUT CONTRAST  Technique:  Contiguous axial images were obtained from the base of the skull through the vertex without contrast.  Comparison: None.  Findings: The ventricles are normal in overall configuration. There is mild ex vacuo dilation of the frontal horn of the left lateral ventricle from  old adjacent deep white matter infarct. There is generalized ventricular and sulcal enlargement reflecting age related volume loss.  There are no parenchymal masses or mass effect.  There is a small lacunar infarct in the central left frontal lobe white matter and in the right internal capsule, posterior limb.  Patchy areas of white matter hypoattenuation are also noted consistent with moderate chronic microvascular ischemic change.  There is no evidence of a recent transcortical infarct.  There are no extra-axial masses or abnormal fluid collections.  There is no intracranial hemorrhage.  The visualized sinuses and the mastoid air cells are clear.  IMPRESSION: No acute intracranial abnormalities.  Age related volume loss, old infarcts and moderate chronic microvascular ischemic change.   Original Report  Authenticated By: Amie Portland, M.D.   Mr [facility] Wo Contrast  03/23/2013   *RADIOLOGY REPORT*  Clinical Data: Acute left MCA territory infarcts.  MRA HEAD WITHOUT CONTRAST  Technique: Angiographic images of the Circle of Willis were obtained using MRA technique without intravenous contrast.  Comparison: MRI brain 03/22/2013.  Findings: There is significant asymmetry of signal in the internal carotid arteries.  Irregularity suggests atherosclerotic changes within the right cavernous carotid artery without a significant stenosis.  There is significant signal loss within the left petrous internal carotid artery and the left cavernous internal carotid artery.  Decreased signal is evident in the left A1 segment compared the right.  Signal loss in the A1 segments anteriorly on both sides is likely artifactual.  The right M1 segments demonstrates relatively normal signal although there is significant signal loss within the proximal branch vessels on the right.  There is complete signal loss in the distal left M1 segment with some reconstitution with some demonstration of flow and more distal left MCA branch vessels.   Moderate stenoses are present in the proximal vertebral arteries bilaterally.  The vertebral basilar junction is within normal limits.  The basilar artery is within normal limits.  Both posterior cerebral arteries originate from basilar tip.  There is moderate stenosis in the proximal right P1 segment.  The distal branch vessels are not well seen.  IMPRESSION:  1.  Significant asymmetric signal in the internal carotid arteries bilaterally.  While there may be stenoses within the left petrous and cavernous internal carotid arteries, I suspect this is secondary to a more proximal stenosis, likely at the carotid bifurcation. 2.  The study is moderately degraded by patient motion. 3.  Significant signal loss and distal vessels suggesting moderate small vessel disease. 4.  Moderate vertebral artery stenoses bilaterally. 5.  Moderate proximal right posterior cerebral artery stenosis. 6.  Probable right A1 segment stenosis in addition to the more proximal stenoses.   Original Report Authenticated By: Marin Roberts, M.D.   Mr Brain Wo Contrast  03/22/2013   *RADIOLOGY REPORT*  Clinical Data: Dysphagia and aphasia for 3 days.  MRI HEAD WITHOUT CONTRAST  Technique:  Multiplanar, multiecho pulse sequences of the brain and surrounding structures were obtained according to standard protocol without intravenous contrast.  Comparison: 03/22/2013 head CT.  No comparison brain MR.  Findings: Motion degraded exam. Sequences were repeated.  Acute non hemorrhagic left frontal - parietal lobe infarcts.  Remote left caudate/lenticular nucleus infarct which was partially hemorrhagic containing blood breakdown products otherwise no evidence intracranial hemorrhage.  Remote thalamic and basal ganglia infarcts.  Marked small vessel disease type changes.  Global atrophy without hydrocephalus.  Abnormal appearance of the right internal carotid artery which may be the significantly narrowed.  IMPRESSION: Motion degraded exam. Sequences  were repeated.  Acute non hemorrhagic left frontal - parietal lobe infarcts.  Remote left caudate/lenticular nucleus infarct which was partially hemorrhagic containing blood breakdown products otherwise no evidence intracranial hemorrhage.  Remote thalamic and basal ganglia infarcts.  Marked small vessel disease type changes.  Global atrophy without hydrocephalus.  Abnormal appearance of the right internal carotid artery which may be significantly narrowed.  Critical Value/emergent results were called by telephone at the time of interpretation on 03/22/2013 at 3:58 p.m. to  Dr. Lynelle Doctor., who verbally acknowledged these results.   Original Report Authenticated By: Lacy Duverney, M.D.   US Carotid Duplex Bilateral  03/23/2013   *RADIOLOGY REPORT*  Clinical Data: Acute left frontal parietal infarcts, and normal  left internal carotid artery by MRA  BILATERAL CAROTID DUPLEX ULTRASOUND  Technique: Wallace Cullens scale imaging, color Doppler and duplex ultrasound were performed of bilateral carotid and vertebral arteries in the neck.  Comparison:  03/23/2013  Criteria:  Quantification of carotid stenosis is based on velocity parameters that correlate the residual internal carotid diameter with NASCET-based stenosis levels, using the diameter of the distal internal carotid lumen as the denominator for stenosis measurement.  The following velocity measurements were obtained:                   PEAK SYSTOLIC/END DIASTOLIC RIGHT ICA:                        83/23cm/sec CCA:                        71/12cm/sec SYSTOLIC ICA/CCA RATIO:     1.15 DIASTOLIC ICA/CCA RATIO:    1.89 ECA:                        83cm/sec  LEFT ICA:                        564/202cm/sec CCA:                        56/5cm/sec SYSTOLIC ICA/CCA RATIO:     9.99 DIASTOLIC ICA/CCA RATIO:    40.46 ECA:                        120cm/sec  Findings:  RIGHT CAROTID ARTERY: Mild plaque formation with calcification of the right carotid system.  No hemodynamically significant right  ICA stenosis, velocity elevation, or turbulent flow.  Degree of narrowing less than 50%.  RIGHT VERTEBRAL ARTERY:  Antegrade  LEFT CAROTID ARTERY: Significant irregular heterogeneous plaque formation of the proximal right ICA.  This narrows the lumen by gray scale imaging.  In this region there is focal aliasing and diffuse spectral broadening.  Significant velocity elevation in the proximal to mid left ICA as above.  Findings are compatible with a hemodynamically significant left ICA stenosis, greater than 70%.  LEFT VERTEBRAL ARTERY:  Antegrade  IMPRESSION: Hemodynamically significant left ICA stenosis estimated greater than 70%.  Mild right ICA narrowing less than 50%.  Patent antegrade vertebral flow bilaterally   Original Report Authenticated By: Judie Petit. Miles Costain, M.D.    Medications:  Scheduled Meds: . aspirin  300 mg Rectal Daily   Or  . aspirin  325 mg Oral Daily  . cefTRIAXone (ROCEPHIN)  IV  1 g Intravenous Q24H  . clopidogrel  75 mg Oral Q breakfast  . enoxaparin (LOVENOX) injection  40 mg Subcutaneous Q24H  . insulin aspart  0-9 Units Subcutaneous Q4H  . sodium chloride  3 mL Intravenous Q12H   Continuous Infusions: . sodium chloride 50 mL/hr at 03/23/13 1628   PRN Meds:.sodium chloride, acetaminophen, acetaminophen, morphine injection, sodium chloride     LOS: 2 days   Torrey Ballinas A. Gerilyn Pilgrim, M.D.  Diplomate, Biomedical engineer of Psychiatry and Neurology ( Neurology).

## 2013-03-24 NOTE — Progress Notes (Signed)
[facility] PROGRESS NOTE  Christine Durham WUJ:[id number] DOB: [birth year] DOA: 03/22/2013 PCP: Ignatius Specking., MD  Assessment/Plan: #1 acute left frontal infarct.  stroke with dysphagia:  More alert with moderate/severe dysphagia. Likely get barium swallow eval this afternoon. Doubt she will pass. Continue aspiration precautions.  Will continue aspirin rectally for now. Appreciate Neurology assistance and plavix will be started once route of intake determined. Will resume lipitor as well. carotid doppler yields greater than 70% stenosis in her left internal carotid artery. Will need vascular surgery follow up. Neurology opines this consultation should be done within 1-2 weeks.  Will  Awaiting Echocardiogram results.    #2 history of nonischemic cardiomyopathy: She had an EF of 25% based on a cardiac catheterization in December of 2013. She did not have any obstructive CAD. She hasn't had any followup since then. Await results of echocardiogram.  Very gently IV hydration given NPO status. Monitor intake output.   #3 recent lumbar compression fractures: Continue with pain control.    #4 diabetes, type II: HbA1c 8.0. She remains NPO as swallow eval pending and she clearly had difficulty with bedside eval.  We will continue SSI   #5 history of COPD: Continue to monitor. Chest xray unremarkable on admission. Suspect aspiration prior to admission. Continue aspiration precautions. sats .90% on room air.   #6 Possible UTI with abnormal, UA: rocephin day #3. Afebrile and non-toxic appearing.     Code Status: full Family Communication: none available Disposition Plan: snf when ready. May need feeding tube, if so discharge in day or 2   Consultants:  neurology  Procedures:  none  Antibiotics:  Rocephin 03/22/13>>>  HPI/Subjective: Alert oriented x3 severe dysarthria  Objective: Filed Vitals:   03/24/13 0432  BP: 148/83  Pulse: 90  Temp: 97.3 F (36.3 C)  Resp: 20     Intake/Output Summary (Last 24 hours) at 03/24/13 0856 Last data filed at 03/24/13 0600  Gross per 24 hour  Intake    660 ml  Output      0 ml  Net    660 ml   Filed Weights   03/22/13 1126 03/22/13 1929 03/24/13 0432  Weight: 55.339 kg (122 lb) 57.698 kg (127 lb 3.2 oz) 58 kg (127 lb 13.9 oz)    Exam:   General:  Thin frail appears chronically ill, NAD  Cardiovascular: RRR No MGR No LE edema  Respiratory: normal effort BS slightly coarse no wheeze  Abdomen: flat soft +BS non-tender to palpation  Musculoskeletal: no clubbing no cyanosis  Neuro: dysarthria, facial symmetry, follow commands UE strength 4/5 bilaterally LE strength 4/5 left 5/5 on right  Data Reviewed: Basic Metabolic Panel:  Recent Labs Lab 03/22/13 1248 03/23/13 0548  NA 135 134*  K 4.0 3.8  CL 97 96  CO2 26 23  GLUCOSE 152* 124*  BUN 18 15  CREATININE 0.73 0.66  CALCIUM 10.5 10.3   Liver Function Tests:  Recent Labs Lab 03/22/13 1248 03/23/13 0548  AST 19 18  ALT 14 13  ALKPHOS 127* 124*  BILITOT 0.3 0.3  PROT 8.0 7.8  ALBUMIN 3.9 3.6   No results found for this basename: LIPASE, AMYLASE,  in the last 168 hours No results found for this basename: AMMONIA,  in the last 168 hours CBC:  Recent Labs Lab 03/22/13 1248 03/23/13 0548  WBC 12.7* 10.2  NEUTROABS 9.5*  --   HGB 13.0 13.4  HCT 37.9 39.9  MCV 89.0 88.5  PLT 306 278  Cardiac Enzymes:  Recent Labs Lab 03/22/13 1248  TROPONINI <0.30   BNP (last 3 results)  Recent Labs  06/26/12 2320  PROBNP 150.2*   CBG:  Recent Labs Lab 03/23/13 1650 03/23/13 1954 03/24/13 0010 03/24/13 0429 03/24/13 0728  GLUCAP 69* 80 106* 96 100*    Recent Results (from the past 240 hour(s))  URINE CULTURE     Status: None   Collection Time    03/22/13  4:10 PM      Result Value Range Status   Specimen Description URINE, CATHETERIZED   Final   Special Requests NONE   Final   Culture  Setup Time     Final   Value:  03/22/2013 18:50     Performed at [facility] Count     Final   Value: >=100,000 COLONIES/ML     Performed at [facility]   Culture     Final   Value: GRAM NEGATIVE RODS     Performed at [facility]   Report Status PENDING   Incomplete     Studies: Dg Chest 1 View  03/22/2013   *RADIOLOGY REPORT*  Clinical Data: Cough  CHEST - 1 VIEW  Comparison: 06/30/2012  Findings: The cardiac shadow is stable.  The lungs are clear bilaterally.  No acute bony abnormality is seen.  IMPRESSION: No acute abnormality noted.   Original Report Authenticated By: Alcide Clever, M.D.   Ct Head Wo Contrast  03/22/2013   *RADIOLOGY REPORT*  Clinical Data: Dysphagia.  Aphasia.  History of stroke.  CT HEAD WITHOUT CONTRAST  Technique:  Contiguous axial images were obtained from the base of the skull through the vertex without contrast.  Comparison: None.  Findings: The ventricles are normal in overall configuration. There is mild ex vacuo dilation of the frontal horn of the left lateral ventricle from old adjacent deep white matter infarct. There is generalized ventricular and sulcal enlargement reflecting age related volume loss.  There are no parenchymal masses or mass effect.  There is a small lacunar infarct in the central left frontal lobe white matter and in the right internal capsule, posterior limb.  Patchy areas of white matter hypoattenuation are also noted consistent with moderate chronic microvascular ischemic change.  There is no evidence of a recent transcortical infarct.  There are no extra-axial masses or abnormal fluid collections.  There is no intracranial hemorrhage.  The visualized sinuses and the mastoid air cells are clear.  IMPRESSION: No acute intracranial abnormalities.  Age related volume loss, old infarcts and moderate chronic microvascular ischemic change.   Original Report Authenticated By: Amie Portland, M.D.   Mr [facility] Wo Contrast  03/23/2013   *RADIOLOGY  REPORT*  Clinical Data: Acute left MCA territory infarcts.  MRA HEAD WITHOUT CONTRAST  Technique: Angiographic images of the Circle of Willis were obtained using MRA technique without intravenous contrast.  Comparison: MRI brain 03/22/2013.  Findings: There is significant asymmetry of signal in the internal carotid arteries.  Irregularity suggests atherosclerotic changes within the right cavernous carotid artery without a significant stenosis.  There is significant signal loss within the left petrous internal carotid artery and the left cavernous internal carotid artery.  Decreased signal is evident in the left A1 segment compared the right.  Signal loss in the A1 segments anteriorly on both sides is likely artifactual.  The right M1 segments demonstrates relatively normal signal although there is significant signal loss within the proximal branch vessels on the  right.  There is complete signal loss in the distal left M1 segment with some reconstitution with some demonstration of flow and more distal left MCA branch vessels.  Moderate stenoses are present in the proximal vertebral arteries bilaterally.  The vertebral basilar junction is within normal limits.  The basilar artery is within normal limits.  Both posterior cerebral arteries originate from basilar tip.  There is moderate stenosis in the proximal right P1 segment.  The distal branch vessels are not well seen.  IMPRESSION:  1.  Significant asymmetric signal in the internal carotid arteries bilaterally.  While there may be stenoses within the left petrous and cavernous internal carotid arteries, I suspect this is secondary to a more proximal stenosis, likely at the carotid bifurcation. 2.  The study is moderately degraded by patient motion. 3.  Significant signal loss and distal vessels suggesting moderate small vessel disease. 4.  Moderate vertebral artery stenoses bilaterally. 5.  Moderate proximal right posterior cerebral artery stenosis. 6.  Probable  right A1 segment stenosis in addition to the more proximal stenoses.   Original Report Authenticated By: Marin Roberts, M.D.   Mr Brain Wo Contrast  03/22/2013   *RADIOLOGY REPORT*  Clinical Data: Dysphagia and aphasia for 3 days.  MRI HEAD WITHOUT CONTRAST  Technique:  Multiplanar, multiecho pulse sequences of the brain and surrounding structures were obtained according to standard protocol without intravenous contrast.  Comparison: 03/22/2013 head CT.  No comparison brain MR.  Findings: Motion degraded exam. Sequences were repeated.  Acute non hemorrhagic left frontal - parietal lobe infarcts.  Remote left caudate/lenticular nucleus infarct which was partially hemorrhagic containing blood breakdown products otherwise no evidence intracranial hemorrhage.  Remote thalamic and basal ganglia infarcts.  Marked small vessel disease type changes.  Global atrophy without hydrocephalus.  Abnormal appearance of the right internal carotid artery which may be the significantly narrowed.  IMPRESSION: Motion degraded exam. Sequences were repeated.  Acute non hemorrhagic left frontal - parietal lobe infarcts.  Remote left caudate/lenticular nucleus infarct which was partially hemorrhagic containing blood breakdown products otherwise no evidence intracranial hemorrhage.  Remote thalamic and basal ganglia infarcts.  Marked small vessel disease type changes.  Global atrophy without hydrocephalus.  Abnormal appearance of the right internal carotid artery which may be significantly narrowed.  Critical Value/emergent results were called by telephone at the time of interpretation on 03/22/2013 at 3:58 p.m. to  Dr. Lynelle Doctor., who verbally acknowledged these results.   Original Report Authenticated By: Lacy Duverney, M.D.   US Carotid Duplex Bilateral  03/23/2013   *RADIOLOGY REPORT*  Clinical Data: Acute left frontal parietal infarcts, and normal left internal carotid artery by MRA  BILATERAL CAROTID DUPLEX ULTRASOUND   Technique: Wallace Cullens scale imaging, color Doppler and duplex ultrasound were performed of bilateral carotid and vertebral arteries in the neck.  Comparison:  03/23/2013  Criteria:  Quantification of carotid stenosis is based on velocity parameters that correlate the residual internal carotid diameter with NASCET-based stenosis levels, using the diameter of the distal internal carotid lumen as the denominator for stenosis measurement.  The following velocity measurements were obtained:                   PEAK SYSTOLIC/END DIASTOLIC RIGHT ICA:                        83/23cm/sec CCA:  71/12cm/sec SYSTOLIC ICA/CCA RATIO:     1.15 DIASTOLIC ICA/CCA RATIO:    1.89 ECA:                        83cm/sec  LEFT ICA:                        564/202cm/sec CCA:                        56/5cm/sec SYSTOLIC ICA/CCA RATIO:     9.99 DIASTOLIC ICA/CCA RATIO:    40.46 ECA:                        120cm/sec  Findings:  RIGHT CAROTID ARTERY: Mild plaque formation with calcification of the right carotid system.  No hemodynamically significant right ICA stenosis, velocity elevation, or turbulent flow.  Degree of narrowing less than 50%.  RIGHT VERTEBRAL ARTERY:  Antegrade  LEFT CAROTID ARTERY: Significant irregular heterogeneous plaque formation of the proximal right ICA.  This narrows the lumen by gray scale imaging.  In this region there is focal aliasing and diffuse spectral broadening.  Significant velocity elevation in the proximal to mid left ICA as above.  Findings are compatible with a hemodynamically significant left ICA stenosis, greater than 70%.  LEFT VERTEBRAL ARTERY:  Antegrade  IMPRESSION: Hemodynamically significant left ICA stenosis estimated greater than 70%.  Mild right ICA narrowing less than 50%.  Patent antegrade vertebral flow bilaterally   Original Report Authenticated By: Judie Petit. Shick, M.D.    Scheduled Meds: . aspirin  300 mg Rectal Daily   Or  . aspirin  325 mg Oral Daily  . cefTRIAXone (ROCEPHIN)   IV  1 g Intravenous Q24H  . clopidogrel  75 mg Oral Q breakfast  . enoxaparin (LOVENOX) injection  40 mg Subcutaneous Q24H  . insulin aspart  0-9 Units Subcutaneous Q4H  . sodium chloride  3 mL Intravenous Q12H   Continuous Infusions: . sodium chloride 50 mL/hr at 03/23/13 1628    Principal Problem:   Acute ischemic stroke Active Problems:   Diabetes mellitus without complication   NICM (nonischemic cardiomyopathy)   Tobacco abuse   COPD (chronic obstructive pulmonary disease)   Dysphagia   UTI (urinary tract infection)    Time spent: 30 minutes    [facility] M  [facility] Pager [phone]. If 7PM-7AM, please contact night-coverage at [email or web address], password [facility] 03/24/2013, 8:56 AM  LOS: 2 days

## 2013-03-24 NOTE — Clinical Social Work Note (Signed)
CSW presented bed offers and pt and sister choose [facility]. Facility notified and aware of probably d/c tomorrow. Sister will complete paperwork today at [facility].  Derenda Fennel, [place] [phone]

## 2013-03-24 NOTE — Clinical Social Work Psychosocial (Signed)
Clinical Social Work Department BRIEF PSYCHOSOCIAL ASSESSMENT 03/24/2013  Patient:  Christine Durham, Christine Durham     Account Number:  [id number]     Admit date:  03/22/2013  Clinical Social Worker:  Nancie Neas  Date/Time:  03/24/2013 09:30 AM  Referred by:  CSW  Date Referred:  03/24/2013 Referred for  SNF Placement   Other Referral:   Interview type:  Patient Other interview type:    PSYCHOSOCIAL DATA Living Status:  FAMILY Admitted from facility:   Level of care:   Primary support name:  Charlaine Primary support relationship to patient:  SIBLING Degree of support available:   supportive per pt    CURRENT CONCERNS Current Concerns  Post-Acute Placement   Other Concerns:    SOCIAL WORK ASSESSMENT / PLAN CSW met with pt at bedside. Pt alert and oriented. Her speech is slurred, but understandable. Pt reports she lives with her sister Charlaine who she describes as her best support. Pt said she had a stroke about 3 years ago while she was living in [place] and went to rehab after that. She then moved here to be with her sister. Prior to admission, pt reports she was independent with ADLs and driving. She is admitted with another stroke. Pt was evaluated yesterday by PT and recommendation is for SNF. CSW discussed placement process and Medicare coverage/criteria. SNF list provided. Pt agreeable to initiate bed search in [facility].   Assessment/plan status:  Psychosocial Support/Ongoing Assessment of Needs Other assessment/ plan:   Information/referral to community resources:   SNF list    PATIENT'S/FAMILY'S RESPONSE TO PLAN OF CARE: Pt realizes she will need rehab before considering going home. CSW will fax out FL2 and follow up with bed offers when available.       Derenda Fennel, [place] [phone]

## 2013-03-24 NOTE — Progress Notes (Signed)
OT Screen Note  Patient Details Name: Raysa Bosak MRN: [id number] DOB: [birth year]   Cancelled Treatment:    Reason Eval/Treat Not Completed: OT screened, no needs identified, will sign off  Limmie Patricia, OTR/L,CBIS   03/24/2013, 12:09 PM

## 2013-03-24 NOTE — Progress Notes (Signed)
The patient was seen and examined. Agree with the note as per [name], NP  Patient has been admitted with an acute left sided stroke. Workup reveals a left-sided internal carotid artery stenosis greater than 70%. I have contacted vein and vascular specialist for a vascular surgery outpatient evaluation. They will contact the nursing unit with an appointment. She'll be continued on dual antiplatelet therapy. Lipid panel shows elevated LDL at 116. She'll be started on a statin. Regarding her swallowing, she underwent modified barium swallow today.  Recommendations are for pureed diet with nectar thick liquids. She will need further therapy with speech therapy, physical therapy and occupational therapy. Echo results are currently pending. Previously her EF was documented as 25%. With her low EF, we will discontinue IV fluids. Plans are for transfer to nursing facility tomorrow. Her urine cultures positive for Klebsiella and she is on appropriate antibiotics.  Christine Durham

## 2013-03-24 NOTE — Clinical Social Work Placement (Signed)
Clinical Social Work Department CLINICAL SOCIAL WORK PLACEMENT NOTE 03/24/2013  Patient:  Christine Durham, Christine Durham  Account Number:  [id number] Admit date:  03/22/2013  Clinical Social Worker:  Derenda Fennel, LCSW  Date/time:  03/24/2013 09:30 AM  Clinical Social Work is seeking post-discharge placement for this patient at the following level of care:   SKILLED NURSING   (*CSW will update this form in Epic as items are completed)   03/24/2013  Patient/family provided with [facility] Department of Clinical Social Work's list of facilities offering this level of care within the geographic area requested by the patient (or if unable, by the patient's family).  03/24/2013  Patient/family informed of their freedom to choose among providers that offer the needed level of care, that participate in Medicare, Medicaid or managed care program needed by the patient, have an available bed and are willing to accept the patient.  03/24/2013  Patient/family informed of [facility]' ownership interest in [facility], as well as of the fact that they are under no obligation to receive care at this facility.  PASARR submitted to EDS on 03/24/2013 PASARR number received from EDS on 03/24/2013  FL2 transmitted to all facilities in geographic area requested by pt/family on  03/24/2013 FL2 transmitted to all facilities within larger geographic area on   Patient informed that his/her managed care company has contracts with or will negotiate with  certain facilities, including the following:     Patient/family informed of bed offers received:   Patient chooses bed at  Physician recommends and patient chooses bed at    Patient to be transferred to  on   Patient to be transferred to facility by   The following physician request were entered in Epic:   Additional Comments:  Derenda Fennel, LCSW [phone]

## 2013-03-24 NOTE — Progress Notes (Signed)
While preforming swallow study patient began to have complaints of chest pain per rad tech. Pt states to me that her chest hurts in left breast area. After obtaining a set of vitals pt denies pain. Patients nurse advised of problem and that patient was now pain free.

## 2013-03-24 NOTE — Progress Notes (Signed)
Speech Language Pathology Dysphagia Treatment Patient Details Name: Lossie Kalp MRN: [id number] DOB: 27-Apr-1941 Today's Date: 03/24/2013 Time: 0935-1000 SLP Time Calculation (min): 25 min  Assessment / Plan / Recommendation Clinical Impression  Ms. Stankus seen for f/u today in room to assess appropriateness of PO intake. Pt was lethargic but responsive to SLP and able to answer questions appropriately given adequate time for processing. Given cues for dry swallow, pt attempted but demonstrated very slow initiation of swallow with tongue groping motion in attempts for retraction. She however, accepted ice chips more easily as she swallowed with increased elevation, still demonstrating prolonged oral phase and multiple swallow attempts to complete pharyngeal phase. The same response was found with pureed item. Noted that while elevation was increased with spoon presentations of puree, it was still below normal limits. According to nursing, pt is doing better than previous day as she is more responsive and less lethargic. Recommend MBSS to determine best means for nutrition at this time.     Diet Recommendation  Continue with Current Diet: NPO    SLP Plan MBS   Pertinent Vitals/Pain Pt reported 8/10 pain in back.   Swallowing Goals   TBD following results of MBSS   Oral Cavity - Oral Hygiene Patient is HIGH RISK - Oral Care Protocol followed (see row info): Yes   Dysphagia Treatment Treatment focused on: Facilitation of oral phase;Facilitation of pharyngeal phase;Facilitation of oral preparatory phase;Other (comment) (Attempts at PO textures) Treatment Methods/Modalities: Skilled observation;Differential diagnosis;Effortful swallow Patient observed directly with PO's: Yes Type of PO's observed: Dysphagia 1 (puree);Ice chips Feeding: Needs assist;Needs set up Liquids provided via: Teaspoon Oral Phase Signs & Symptoms: Anterior loss/spillage;Prolonged bolus formation;Prolonged oral  phase Pharyngeal Phase Signs & Symptoms: Suspected delayed swallow initiation;Multiple swallows Type of cueing: Verbal;Tactile Amount of cueing: Moderate   GO     Hailee Hollick S 03/24/2013, 10:21 AM

## 2013-03-24 NOTE — Clinical Social Work Placement (Signed)
Clinical Social Work Department CLINICAL SOCIAL WORK PLACEMENT NOTE 03/24/2013  Patient:  Christine Durham, Christine Durham  Account Number:  [id number] Admit date:  03/22/2013  Clinical Social Worker:  Derenda Fennel, LCSW  Date/time:  03/24/2013 09:30 AM  Clinical Social Work is seeking post-discharge placement for this patient at the following level of care:   SKILLED NURSING   (*CSW will update this form in Epic as items are completed)   03/24/2013  Patient/family provided with [facility] Department of Clinical Social Work's list of facilities offering this level of care within the geographic area requested by the patient (or if unable, by the patient's family).  03/24/2013  Patient/family informed of their freedom to choose among providers that offer the needed level of care, that participate in Medicare, Medicaid or managed care program needed by the patient, have an available bed and are willing to accept the patient.  03/24/2013  Patient/family informed of [facility]' ownership interest in [facility], as well as of the fact that they are under no obligation to receive care at this facility.  PASARR submitted to EDS on 03/24/2013 PASARR number received from EDS on 03/24/2013  FL2 transmitted to all facilities in geographic area requested by pt/family on  03/24/2013 FL2 transmitted to all facilities within larger geographic area on   Patient informed that his/her managed care company has contracts with or will negotiate with  certain facilities, including the following:     Patient/family informed of bed offers received:  03/24/2013 Patient chooses bed at [facility] Physician recommends and patient chooses bed at  [facility]  Patient to be transferred to  on   Patient to be transferred to facility by   The following physician request were entered in Epic:   Additional Comments:  Derenda Fennel, LCSW [phone]

## 2013-03-24 NOTE — Procedures (Signed)
Objective Swallowing Evaluation: Modified Barium Swallowing Study  Patient Details  Name: Christine Durham MRN: [id number] Date of Birth: 10/26/1940  Today's Date: 03/24/2013 Time: 4098-1191 SLP Time Calculation (min): 30 min  Past Medical History:  Past Medical History  Diagnosis Date  . 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 Diabetes mellitus without complication   . Asthma   . HTN (hypertension)   . Tobacco abuse   . 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 Appendectomy     HPI:  72 yo female with 3 day history of acute dysphagia and dysarthria. MRI shows: Acute non hemorrhagic left frontal - parietal lobe infarcts.  Remote left caudate/lenticular nucleus infarct which was partially hemorrhagic containing blood breakdown products otherwise no evidence intracranial hemorrhage.  Remote thalamic and basal ganglia infarcts.  Marked small vessel disease type changes.  Global atrophy without hydrocephalus.  Abnormal appearance of the right internal carotid artery which may be significantly narrowed.     Assessment / Plan / Recommendation Clinical Impression  Dysphagia Diagnosis: Moderate oral phase dysphagia;Moderate pharyngeal phase dysphagia Clinical impression: Pt presents with moderate oropharyngeal dysphagia characterized by delayed oral transit, impaired mastication, premature spillage into valleculae and pyriform sinuses, delayed swallow initiation and adequate laryngeal excursion resulting in silent penetration and aspiration of thin liquids as well as residuals in lateral channels. Controlled bolus size prevented penetration and aspiration. During the study, pt c/o severe sharp chest pain and an RN was notified. Pain subsided and pt was able to complete MBSS. Recommend pureed diet with nectar liquids with staff supervision for small sips/bites. SLP to follow clinically for upgrades to mechanical soft as  appropriate.     Treatment Recommendation  Therapy as outlined in treatment plan below    Diet Recommendation Dysphagia 1 (Puree);Nectar-thick liquid   Liquid Administration via: Cup Medication Administration: Crushed with puree Supervision: Full supervision/cueing for compensatory strategies Compensations: Small sips/bites;Slow rate Postural Changes and/or Swallow Maneuvers: Seated upright 90 degrees;Upright 30-60 min after meal    Other  Recommendations Oral Care Recommendations: Staff/trained caregiver to provide oral care;Oral care BID Other Recommendations: Clarify dietary restrictions;Remove water pitcher   Follow Up Recommendations  Skilled Nursing facility    Frequency and Duration min 3x week  1 week       SLP Swallow Goals Patient will consume recommended diet without observed clinical signs of aspiration with: Minimal assistance Patient will utilize recommended strategies during swallow to increase swallowing safety with: Minimal assistance   General Date of Onset: 03/20/13 HPI: 72 yo female with 3 day history of acute dysphagia and dysarthria. MRI shows: Acute non hemorrhagic left frontal - parietal lobe infarcts.  Remote left caudate/lenticular nucleus infarct which was partially hemorrhagic containing blood breakdown products otherwise no evidence intracranial hemorrhage.  Remote thalamic and basal ganglia infarcts.  Marked small vessel disease type changes.  Global atrophy without hydrocephalus.  Abnormal appearance of the right internal carotid artery which may be significantly narrowed. Type of Study: Modified Barium Swallowing Study Reason for Referral: Objectively evaluate swallowing function Previous Swallow Assessment: BSE yesterday. NPO. Diet Prior to this Study: NPO Temperature Spikes Noted: No Respiratory Status: Room air History of Recent Intubation: No Behavior/Cognition: Alert;Cooperative;Requires cueing Oral Cavity - Dentition: Poor condition Oral  Motor / Sensory Function: Impaired - see Bedside swallow eval Self-Feeding Abilities: Needs set up Patient Positioning: Upright in bed Baseline Vocal Quality: Low vocal  intensity Volitional Cough: Weak Volitional Swallow: Able to elicit (delayed) Anatomy: Within functional limits Pharyngeal Secretions: Not observed secondary MBS    Reason for Referral Objectively evaluate swallowing function   Oral Phase Oral Preparation/Oral Phase Oral Phase: Impaired Oral - Nectar Oral - Nectar Teaspoon: Delayed oral transit Oral - Nectar Cup: Delayed oral transit Oral - Thin Oral - Thin Cup: Other (Comment) (decreased oral control) Oral - Solids Oral - Puree: Delayed oral transit;Lingual pumping Oral - Mechanical Soft: Delayed oral transit;Impaired mastication   Pharyngeal Phase Pharyngeal Phase Pharyngeal Phase: Impaired Pharyngeal - Nectar Pharyngeal - Nectar Teaspoon: Delayed swallow initiation;Premature spillage to valleculae Pharyngeal - Nectar Cup: Delayed swallow initiation;Premature spillage to valleculae;Premature spillage to pyriform sinuses;Penetration/Aspiration before swallow;Penetration/Aspiration during swallow;Lateral channel residue (small sips prevented penetration) Penetration/Aspiration details (nectar cup): Material enters airway, CONTACTS cords then ejected out;Material enters airway, remains ABOVE vocal cords and not ejected out Pharyngeal - Thin Pharyngeal - Thin Cup: Delayed swallow initiation;Premature spillage to valleculae;Premature spillage to pyriform sinuses;Reduced airway/laryngeal closure;Penetration/Aspiration during swallow;Moderate aspiration;Lateral channel residue Penetration/Aspiration details (thin cup): Material enters airway, passes BELOW cords without attempt by patient to eject out (silent aspiration);Material enters airway, remains ABOVE vocal cords then ejected out;Material enters airway, remains ABOVE vocal cords and not ejected out Pharyngeal -  Solids Pharyngeal - Puree: Delayed swallow initiation;Premature spillage to valleculae Pharyngeal - Mechanical Soft: Delayed swallow initiation;Premature spillage to valleculae  Cervical Esophageal Phase    GO    Cervical Esophageal Phase Cervical Esophageal Phase: [facility]         Yulianna Folse S 03/24/2013, 12:39 PM

## 2013-03-24 NOTE — Progress Notes (Signed)
Christine Durham from [facility] called with appointment for patient.  Patient has appointment on 03/29/2013 at 8:30 a.m. With Dr. Edilia Bo.  Follow-up appointment put in computer for discharge follow-up.

## 2013-03-24 NOTE — Care Management Note (Unsigned)
    Page 1 of 1   03/24/2013     11:32:25 AM   CARE MANAGEMENT NOTE 03/24/2013  Patient:  GWYNDOLYN, GUILFORD   Account Number:  [id number]  Date Initiated:  03/24/2013  Documentation initiated by:  Anibal Henderson  Subjective/Objective Assessment:   Admitted with + CVA. Pt lives at home with sister, but is very debilitated with previous CVA, recent lumbar Fx, and this CVA. CSW is following for SNF at this time, for rehab     Action/Plan:   Anticipated DC Date:  03/25/2013   Anticipated DC Plan:  SKILLED NURSING FACILITY  In-house referral  Clinical Social Worker      DC Planning Services  CM consult      Choice offered to / List presented to:             Status of service:  In process, will continue to follow Medicare Important Message given?   (If response is "NO", the following Medicare IM given date fields will be blank) Date Medicare IM given:   Date Additional Medicare IM given:    Discharge Disposition:    Per UR Regulation:  Reviewed for med. necessity/level of care/duration of stay  If discussed at Long Length of Stay Meetings, dates discussed:    Comments:  03/24/13 1100 Aydien Majette RN/CM

## 2013-03-24 NOTE — Progress Notes (Signed)
Physical Therapy Treatment Patient Details Name: Kallan Bischoff MRN: [id number] DOB: February 04, 1941 Today's Date: 03/24/2013 Time: 4098-1191 PT Time Calculation (min): 34 min  PT Assessment / Plan / Recommendation  History of Present Illness     PT Comments   Pt is progressing well and reports reduced pain in her back.  Her speech is much more understandable today. She was able to ambulate 20' with a walker and minimal pain.  She should progress rapidly in SNF.  Follow Up Recommendations        Does the patient have the potential to tolerate intense rehabilitation     Barriers to Discharge        Equipment Recommendations       Recommendations for Other Services    Frequency     Progress towards PT Goals Progress towards PT goals: Progressing toward goals  Plan Current plan remains appropriate    Precautions / Restrictions     Pertinent Vitals/Pain     Mobility  Bed Mobility Supine to Sit: 3: Mod assist;HOB elevated Transfers Sit to Stand: 4: Min guard;From bed;With upper extremity assist Stand to Sit: 5: Supervision;To chair/3-in-1;With upper extremity assist Ambulation/Gait Ambulation/Gait Assistance: 4: Min assist Ambulation Distance (Feet): 20 Feet Assistive device: Rolling walker Stairs: No Wheelchair Mobility Wheelchair Mobility: No    Exercises General Exercises - Lower Extremity Heel Slides: AAROM;AROM;Both;10 reps;Supine Hip ABduction/ADduction: AROM;AAROM;Both;10 reps;Supine   PT Diagnosis:    PT Problem List:   PT Treatment Interventions:     PT Goals (current goals can now be found in the care plan section)    Visit Information  Last PT Received On: 03/24/13    Subjective Data      Cognition       Balance     End of Session PT - End of Session Equipment Utilized During Treatment: Gait belt Activity Tolerance: Patient tolerated treatment well Patient left: in chair;with call bell/phone within reach;with bed alarm set Nurse Communication:  Mobility status   GP     Konrad Penta 03/24/2013, 6:14 PM

## 2013-03-25 ENCOUNTER — Inpatient Hospital Stay
Admission: RE | Admit: 2013-03-25 | Discharge: 2013-04-10 | Disposition: A | Discharge status: Home Health | Payer: Medicare Other | Source: Ambulatory Visit | Attending: Internal Medicine | Admitting: Internal Medicine

## 2013-03-25 LAB — BASIC METABOLIC PANEL
BUN: 14 mg/dL (ref 6–23)
Calcium: 9.9 mg/dL (ref 8.4–10.5)
Creatinine, Ser: 0.61 mg/dL (ref 0.50–1.10)
GFR calc Af Amer: >90 mL/min (ref >90)
GFR calc non Af Amer: 90 mL/min — ABNORMAL LOW (ref >90)

## 2013-03-25 LAB — GLUCOSE, CAPILLARY: Glucose-Capillary: 129 mg/dL — ABNORMAL HIGH (ref 70–99)

## 2013-03-25 MED ORDER — ATORVASTATIN CALCIUM 10 MG PO TABS: 20.0000 mg | ORAL_TABLET | Freq: Every day | ORAL | Status: DC

## 2013-03-25 MED ORDER — OXYCODONE-ACETAMINOPHEN 5-325 MG PO TABS: 2.0000 | ORAL_TABLET | ORAL | Status: DC | PRN

## 2013-03-25 MED ORDER — CLOPIDOGREL BISULFATE 75 MG PO TABS: 75.0000 mg | ORAL_TABLET | Freq: Every day | ORAL | Status: DC

## 2013-03-25 MED ORDER — ASPIRIN EC 81 MG PO TBEC: 81.0000 mg | DELAYED_RELEASE_TABLET | Freq: Every day | ORAL | Status: DC

## 2013-03-25 MED ORDER — CIPROFLOXACIN HCL 250 MG PO TABS: 250.0000 mg | ORAL_TABLET | Freq: Two times a day (BID) | ORAL | Status: DC

## 2013-03-25 MED ORDER — STROKE: EARLY STAGES OF RECOVERY BOOK
Freq: Once | Status: AC
  Administered 2013-03-25: 1
  Filled 2013-03-25: qty 1

## 2013-03-25 NOTE — Progress Notes (Signed)
Report called to [facility] at [facility].  Packet sent with patient.  NT transferred pt via w/c to [facility].  Pt stable at time of discharge.

## 2013-03-25 NOTE — Discharge Summary (Signed)
Physician Discharge Summary  Christine Durham ZOX:[id number] DOB: [birth year] DOA: 03/22/2013  PCP: Ignatius Specking., MD  Admit date: 03/22/2013 Discharge date: 03/25/2013  Time spent: 40 minutes  Recommendations for Outpatient Follow-up:  1. Follow up with vascular surgery, Dr. Edilia Bo on 9/17 2. Follow up with primary care physician in 2 weeks 3. Follow up with neurology in 1-2 months  Discharge Diagnoses:  Principal Problem:   Acute ischemic stroke Active Problems:   Diabetes mellitus without complication   NICM (nonischemic cardiomyopathy)   Tobacco abuse   COPD (chronic obstructive pulmonary disease)   Dysphagia   UTI (urinary tract infection)   Discharge Condition: stable  Diet recommendation: carb modified/heart healthy, pureed diet, nectar thick liquids  Filed Weights   03/22/13 1929 03/24/13 0432 03/25/13 0500  Weight: 57.698 kg (127 lb 3.2 oz) 58 kg (127 lb 13.9 oz) 58.469 kg (128 lb 14.4 oz)    History of present illness:  Christine Durham is a 72 y.o. female with a past medical history of stroke in 2011 with left-sided deficits, COPD, diabetes type 2, hypertension who was in her usual state of health till end of August when she had a mechanical fall. She started having back pain and was diagnosed with a lumbar compression fracture. She was asked to followup with her primary care physician. And then she was doing fine up until Sunday when she started having difficulty with speech. She was noted to have slurred speech by her sister. She was also having difficulty with eating solids initially and then with drinking fluids. Unfortunately neither the patient nor her sister sought attention for this. And then she started gagging and choking while having liquids last night. And so the sister decided to bring her to the hospital today. Patient denies any weakness on any one side except for her old left-sided weakness. She does admit to generalized fatigue. Denies any headaches. She does  admit to some shortness of breath and cough with some phlegm. Denies any fever or chills. She ambulates at baseline using a walker. Due to her slurred speech it was difficult to obtain history from her. Some history was provided by the sister.   Hospital Course:  This patient was admitted to the hospital with a hazy and dysphagia. She was found to have an acute left-sided stroke. Patient was previously taking Aggrenox prior to admission. She was seen by neurology who recommended a combination of aspirin and Plavix for the next 3 months followed by single antiplatelet agent. Echocardiogram was done and ejection fraction was found to be within normal range. MRA shows moderate scattered intracranial arterial disease. Carotid ultrasound indicated a greater than 70% stenosis in the left internal carotid artery. The patient has been referred to vascular surgery and has an appointment on 9/17 to discuss further management for carotid artery stenosis. Lipid panel was checked and LDL is in the elevated at 811. Her Lipitor was increased from 10 mg to 20 mg. Blood pressure has been stable in the hospital. Hemoglobin A1c is in the elevated at 8. This can be further managed as an outpatient. She was seen by physical therapy and occupational therapy who both recommended skilled nursing facility placement. Regarding her dysphagia she was seen by speech therapy and underwent modified barium swallow. Following recommendations were for pured diet and nectar thick liquids. She will need continued speech therapy after discharge. Patient was I. candidate for TPA due to delaying patient arrival. She is felt stable for discharge to a skilled facility today. Incidentally,  she was found to have a urinary tract infection with Klebsiella. She's been afebrile and has a normal WBC count. We will complete her antibiotic course with ciprofloxacin.  Procedures: Echo: - Left ventricle: The cavity size was below normal. Wall thickness was  normal. Systolic function was normal. The estimated ejection fraction was in the range of 60% to 65%. Wall motion was normal; there were no regional wall motion abnormalities. Doppler parameters are consistent with abnormal left ventricular relaxation (grade 1 diastolic dysfunction). - Aortic valve: Mildly calcified annulus. Trileaflet; mildly thickened leaflets. Valve area: 1.98cm^2(VTI). Valve area: 1.94cm^2 (Vmax). - Mitral valve: Mildly calcified annulus. Mildly thickened leaflets . - Pulmonary arteries: Systolic pressure was moderately increased, estimated to be 40mm Hg.  Modified barium swallow study: Moderate oral phase dysphagia;Moderate pharyngeal phase dysphagia  Clinical impression: Pt presents with moderate oropharyngeal dysphagia characterized by delayed oral transit, impaired mastication, premature spillage into valleculae and pyriform sinuses, delayed swallow initiation and adequate laryngeal excursion resulting in silent penetration and aspiration of thin liquids as well as residuals in lateral channels. Controlled bolus size prevented penetration and aspiration. During the study, pt c/o severe sharp chest pain and an RN was notified. Pain subsided and pt was able to complete MBSS. Recommend pureed diet with nectar liquids with staff supervision for small sips/bites. SLP to follow clinically for upgrades to mechanical soft as appropriate.      Consultations:  Neurology  Discharge Exam: Filed Vitals:   03/25/13 1000  BP: 128/75  Pulse: 90  Temp: 98.6 F (37 C)  Resp: 18    General: NAD Cardiovascular: S1, S2 RRR Respiratory: CTA B  Discharge Instructions  Discharge Orders   Future Appointments Provider Department Dept Phone   03/29/2013 8:30 AM [facility] [phone]   03/29/2013 9:00 AM Chuck Hint, MD [facility] - Closed [phone]   Future Orders Complete By Expires   Diet -  low sodium heart healthy  As directed    Diet Carb Modified  As directed    Increase activity slowly  As directed        Medication List    STOP taking these medications       dipyridamole-aspirin 200-25 MG per 12 hr capsule  Commonly known as:  AGGRENOX     naproxen 500 MG tablet  Commonly known as:  NAPROSYN      TAKE these medications       albuterol 108 (90 BASE) MCG/ACT inhaler  Commonly known as:  PROVENTIL HFA;VENTOLIN HFA  Inhale 2 puffs into the lungs every 6 (six) hours as needed for wheezing.     aspirin EC 81 MG tablet  Take 1 tablet (81 mg total) by mouth daily.     atorvastatin 10 MG tablet  Commonly known as:  LIPITOR  Take 2 tablets (20 mg total) by mouth at bedtime.     ciprofloxacin 250 MG tablet  Commonly known as:  CIPRO  Take 1 tablet (250 mg total) by mouth 2 (two) times daily. For 3 more days     clopidogrel 75 MG tablet  Commonly known as:  PLAVIX  Take 1 tablet (75 mg total) by mouth daily with breakfast.     donepezil 5 MG tablet  Commonly known as:  ARICEPT  Take 5 mg by mouth at bedtime.     lisinopril 5 MG tablet  Commonly known as:  PRINIVIL,ZESTRIL  Take 5 mg by mouth daily.  metFORMIN 500 MG tablet  Commonly known as:  GLUCOPHAGE  Take 1 tablet (500 mg total) by mouth 2 (two) times daily with a meal.     oxyCODONE-acetaminophen 5-325 MG per tablet  Commonly known as:  PERCOCET  Take 2 tablets by mouth every 4 (four) hours as needed for pain.       No Known Allergies     Follow-up Information   Follow up with Schedule appt with Vascular Surgeon at Discharge.      Follow up with [facility] On 03/29/2013. (Patient has appointment on 03/29/2013 at 8:30 a.m. with Dr. Edilia Bo.)    Contact information:   [address] [place] [address] [phone]       The results of significant diagnostics from this hospitalization (including imaging, microbiology, ancillary and laboratory) are listed below for  reference.    Significant Diagnostic Studies: Dg Chest 1 View  03/22/2013   *RADIOLOGY REPORT*  Clinical Data: Cough  CHEST - 1 VIEW  Comparison: 06/30/2012  Findings: The cardiac shadow is stable.  The lungs are clear bilaterally.  No acute bony abnormality is seen.  IMPRESSION: No acute abnormality noted.   Original Report Authenticated By: Alcide Clever, M.D.   Dg Lumbar Spine Complete  03/12/2013   *RADIOLOGY REPORT*  Clinical Data: Fall, pain  LUMBAR SPINE - COMPLETE 4+ VIEW  Comparison: None.  Findings: Normal alignment.  Calcification of the aorta.  Mild compression deformity of T12.  Mild compression deformity of L3-8. Significant multilevel degenerative disc disease.  Calcified pelvic masses consistent with fibroids.  Mild to moderate gaseous distension of small and large bowel suggesting ileus.  IMPRESSION: Significant degenerative change.  There are two mild compression deformities, age uncertain.   Original Report Authenticated By: Esperanza Heir, M.D.   Dg Pelvis 1-2 Views  03/12/2013   *RADIOLOGY REPORT*  Clinical Data: Fall, back pain  PELVIS - 1-2 VIEW  Comparison: None.  Findings: There is a 5 cm and 2 cm mass in the pelvis both of which are calcified and appear consistent with fibroids.  There is diffuse mild to moderate gaseous distension of small and large bowel, suggesting ileus.  Pelvic bones are intact with no fracture identified.  IMPRESSION: No acute findings   Original Report Authenticated By: Esperanza Heir, M.D.   Ct Head Wo Contrast  03/22/2013   *RADIOLOGY REPORT*  Clinical Data: Dysphagia.  Aphasia.  History of stroke.  CT HEAD WITHOUT CONTRAST  Technique:  Contiguous axial images were obtained from the base of the skull through the vertex without contrast.  Comparison: None.  Findings: The ventricles are normal in overall configuration. There is mild ex vacuo dilation of the frontal horn of the left lateral ventricle from old adjacent deep white matter infarct. There is  generalized ventricular and sulcal enlargement reflecting age related volume loss.  There are no parenchymal masses or mass effect.  There is a small lacunar infarct in the central left frontal lobe white matter and in the right internal capsule, posterior limb.  Patchy areas of white matter hypoattenuation are also noted consistent with moderate chronic microvascular ischemic change.  There is no evidence of a recent transcortical infarct.  There are no extra-axial masses or abnormal fluid collections.  There is no intracranial hemorrhage.  The visualized sinuses and the mastoid air cells are clear.  IMPRESSION: No acute intracranial abnormalities.  Age related volume loss, old infarcts and moderate chronic microvascular ischemic change.   Original Report Authenticated By: Onalee Hua  Oleta Mouse, M.D.   Mr Maxine Glenn Head Wo Contrast  03/23/2013   *RADIOLOGY REPORT*  Clinical Data: Acute left MCA territory infarcts.  MRA HEAD WITHOUT CONTRAST  Technique: Angiographic images of the Circle of Willis were obtained using MRA technique without intravenous contrast.  Comparison: MRI brain 03/22/2013.  Findings: There is significant asymmetry of signal in the internal carotid arteries.  Irregularity suggests atherosclerotic changes within the right cavernous carotid artery without a significant stenosis.  There is significant signal loss within the left petrous internal carotid artery and the left cavernous internal carotid artery.  Decreased signal is evident in the left A1 segment compared the right.  Signal loss in the A1 segments anteriorly on both sides is likely artifactual.  The right M1 segments demonstrates relatively normal signal although there is significant signal loss within the proximal branch vessels on the right.  There is complete signal loss in the distal left M1 segment with some reconstitution with some demonstration of flow and more distal left MCA branch vessels.  Moderate stenoses are present in the proximal  vertebral arteries bilaterally.  The vertebral basilar junction is within normal limits.  The basilar artery is within normal limits.  Both posterior cerebral arteries originate from basilar tip.  There is moderate stenosis in the proximal right P1 segment.  The distal branch vessels are not well seen.  IMPRESSION:  1.  Significant asymmetric signal in the internal carotid arteries bilaterally.  While there may be stenoses within the left petrous and cavernous internal carotid arteries, I suspect this is secondary to a more proximal stenosis, likely at the carotid bifurcation. 2.  The study is moderately degraded by patient motion. 3.  Significant signal loss and distal vessels suggesting moderate small vessel disease. 4.  Moderate vertebral artery stenoses bilaterally. 5.  Moderate proximal right posterior cerebral artery stenosis. 6.  Probable right A1 segment stenosis in addition to the more proximal stenoses.   Original Report Authenticated By: Marin Roberts, M.D.   Mr Brain Wo Contrast  03/22/2013   *RADIOLOGY REPORT*  Clinical Data: Dysphagia and aphasia for 3 days.  MRI HEAD WITHOUT CONTRAST  Technique:  Multiplanar, multiecho pulse sequences of the brain and surrounding structures were obtained according to standard protocol without intravenous contrast.  Comparison: 03/22/2013 head CT.  No comparison brain MR.  Findings: Motion degraded exam. Sequences were repeated.  Acute non hemorrhagic left frontal - parietal lobe infarcts.  Remote left caudate/lenticular nucleus infarct which was partially hemorrhagic containing blood breakdown products otherwise no evidence intracranial hemorrhage.  Remote thalamic and basal ganglia infarcts.  Marked small vessel disease type changes.  Global atrophy without hydrocephalus.  Abnormal appearance of the right internal carotid artery which may be the significantly narrowed.  IMPRESSION: Motion degraded exam. Sequences were repeated.  Acute non hemorrhagic left  frontal - parietal lobe infarcts.  Remote left caudate/lenticular nucleus infarct which was partially hemorrhagic containing blood breakdown products otherwise no evidence intracranial hemorrhage.  Remote thalamic and basal ganglia infarcts.  Marked small vessel disease type changes.  Global atrophy without hydrocephalus.  Abnormal appearance of the right internal carotid artery which may be significantly narrowed.  Critical Value/emergent results were called by telephone at the time of interpretation on 03/22/2013 at 3:58 p.m. to  Dr. Lynelle Doctor., who verbally acknowledged these results.   Original Report Authenticated By: Lacy Duverney, M.D.   US Carotid Duplex Bilateral  03/23/2013   *RADIOLOGY REPORT*  Clinical Data: Acute left frontal parietal infarcts, and normal left internal carotid  artery by MRA  BILATERAL CAROTID DUPLEX ULTRASOUND  Technique: Wallace Cullens scale imaging, color Doppler and duplex ultrasound were performed of bilateral carotid and vertebral arteries in the neck.  Comparison:  03/23/2013  Criteria:  Quantification of carotid stenosis is based on velocity parameters that correlate the residual internal carotid diameter with NASCET-based stenosis levels, using the diameter of the distal internal carotid lumen as the denominator for stenosis measurement.  The following velocity measurements were obtained:                   PEAK SYSTOLIC/END DIASTOLIC RIGHT ICA:                        83/23cm/sec CCA:                        71/12cm/sec SYSTOLIC ICA/CCA RATIO:     1.15 DIASTOLIC ICA/CCA RATIO:    1.89 ECA:                        83cm/sec  LEFT ICA:                        564/202cm/sec CCA:                        56/5cm/sec SYSTOLIC ICA/CCA RATIO:     9.99 DIASTOLIC ICA/CCA RATIO:    40.46 ECA:                        120cm/sec  Findings:  RIGHT CAROTID ARTERY: Mild plaque formation with calcification of the right carotid system.  No hemodynamically significant right ICA stenosis, velocity elevation, or  turbulent flow.  Degree of narrowing less than 50%.  RIGHT VERTEBRAL ARTERY:  Antegrade  LEFT CAROTID ARTERY: Significant irregular heterogeneous plaque formation of the proximal right ICA.  This narrows the lumen by gray scale imaging.  In this region there is focal aliasing and diffuse spectral broadening.  Significant velocity elevation in the proximal to mid left ICA as above.  Findings are compatible with a hemodynamically significant left ICA stenosis, greater than 70%.  LEFT VERTEBRAL ARTERY:  Antegrade  IMPRESSION: Hemodynamically significant left ICA stenosis estimated greater than 70%.  Mild right ICA narrowing less than 50%.  Patent antegrade vertebral flow bilaterally   Original Report Authenticated By: Judie Petit. Miles Costain, M.D.   Dg Swallowing Func-speech Pathology  03/24/2013   Esmeralda Arthur Crutchfield, CCC-SLP     03/24/2013 12:40 PM Objective Swallowing Evaluation: Modified Barium Swallowing Study   Patient Details  Name: Charon Smedberg MRN: [id number] Date of Birth: 1940-09-23  Today's Date: 03/24/2013 Time: 4098-1191 SLP Time Calculation (min): 30 min  Past Medical History:  Past Medical History  Diagnosis Date  . 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 Diabetes mellitus without complication   . Asthma   . HTN (hypertension)   . Tobacco abuse   . 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 Appendectomy     HPI:  72 yo female with 3 day history of acute dysphagia and  dysarthria. MRI shows: Acute non hemorrhagic left frontal -  parietal lobe infarcts.  Remote left caudate/lenticular nucleus  infarct which was partially hemorrhagic containing blood  breakdown products otherwise no evidence  intracranial hemorrhage.   Remote thalamic and basal ganglia infarcts.  Marked small vessel  disease type changes.  Global atrophy without hydrocephalus.   Abnormal appearance of the right internal carotid artery which  may be significantly narrowed.      Assessment / Plan / Recommendation Clinical Impression  Dysphagia Diagnosis: Moderate oral phase dysphagia;Moderate  pharyngeal phase dysphagia Clinical impression: Pt presents with moderate oropharyngeal  dysphagia characterized by delayed oral transit, impaired  mastication, premature spillage into valleculae and pyriform  sinuses, delayed swallow initiation and adequate laryngeal  excursion resulting in silent penetration and aspiration of thin  liquids as well as residuals in lateral channels. Controlled  bolus size prevented penetration and aspiration. During the  study, pt c/o severe sharp chest pain and an RN was notified.  Pain subsided and pt was able to complete MBSS. Recommend pureed  diet with nectar liquids with staff supervision for small  sips/bites. SLP to follow clinically for upgrades to mechanical  soft as appropriate.     Treatment Recommendation  Therapy as outlined in treatment plan below    Diet Recommendation Dysphagia 1 (Puree);Nectar-thick liquid   Liquid Administration via: Cup Medication Administration: Crushed with puree Supervision: Full supervision/cueing for compensatory strategies Compensations: Small sips/bites;Slow rate Postural Changes and/or Swallow Maneuvers: Seated upright 90  degrees;Upright 30-60 min after meal    Other  Recommendations Oral Care Recommendations: Staff/trained  caregiver to provide oral care;Oral care BID Other Recommendations: Clarify dietary restrictions;Remove water  pitcher   Follow Up Recommendations  Skilled Nursing facility    Frequency and Duration min 3x week  1 week       SLP Swallow Goals Patient will consume recommended diet without observed clinical  signs of aspiration with: Minimal assistance Patient will utilize recommended strategies during swallow to  increase swallowing safety with: Minimal assistance   General Date of Onset: 03/20/13 HPI: 72 yo female with 3 day history of acute dysphagia and  dysarthria. MRI shows: Acute non hemorrhagic  left frontal -  parietal lobe infarcts.  Remote left caudate/lenticular nucleus  infarct which was partially hemorrhagic containing blood  breakdown products otherwise no evidence intracranial hemorrhage.   Remote thalamic and basal ganglia infarcts.  Marked small vessel  disease type changes.  Global atrophy without hydrocephalus.   Abnormal appearance of the right internal carotid artery which  may be significantly narrowed. Type of Study: Modified Barium Swallowing Study Reason for Referral: Objectively evaluate swallowing function Previous Swallow Assessment: BSE yesterday. NPO. Diet Prior to this Study: NPO Temperature Spikes Noted: No Respiratory Status: Room air History of Recent Intubation: No Behavior/Cognition: Alert;Cooperative;Requires cueing Oral Cavity - Dentition: Poor condition Oral Motor / Sensory Function: Impaired - see Bedside swallow  eval Self-Feeding Abilities: Needs set up Patient Positioning: Upright in bed Baseline Vocal Quality: Low vocal intensity Volitional Cough: Weak Volitional Swallow: Able to elicit (delayed) Anatomy: Within functional limits Pharyngeal Secretions: Not observed secondary MBS    Reason for Referral Objectively evaluate swallowing function   Oral Phase Oral Preparation/Oral Phase Oral Phase: Impaired Oral - Nectar Oral - Nectar Teaspoon: Delayed oral transit Oral - Nectar Cup: Delayed oral transit Oral - Thin Oral - Thin Cup: Other (Comment) (decreased oral control) Oral - Solids Oral - Puree: Delayed oral transit;Lingual pumping Oral - Mechanical Soft: Delayed oral transit;Impaired mastication   Pharyngeal Phase Pharyngeal Phase Pharyngeal Phase: Impaired Pharyngeal - Nectar Pharyngeal - Nectar Teaspoon: Delayed swallow  initiation;Premature spillage to valleculae Pharyngeal - Nectar Cup: Delayed swallow initiation;Premature  spillage to valleculae;Premature spillage to pyriform  sinuses;Penetration/Aspiration before  swallow;Penetration/Aspiration during  swallow;Lateral channel  residue (small sips prevented penetration) Penetration/Aspiration details (nectar cup): Material enters  airway, CONTACTS cords then ejected out;Material enters airway,  remains ABOVE vocal cords and not ejected out Pharyngeal - Thin Pharyngeal - Thin Cup: Delayed swallow initiation;Premature  spillage to valleculae;Premature spillage to pyriform  sinuses;Reduced airway/laryngeal closure;Penetration/Aspiration  during swallow;Moderate aspiration;Lateral channel residue Penetration/Aspiration details (thin cup): Material enters  airway, passes BELOW cords without attempt by patient to eject  out (silent aspiration);Material enters airway, remains ABOVE  vocal cords then ejected out;Material enters airway, remains  ABOVE vocal cords and not ejected out Pharyngeal - Solids Pharyngeal - Puree: Delayed swallow initiation;Premature spillage  to valleculae Pharyngeal - Mechanical Soft: Delayed swallow  initiation;Premature spillage to valleculae  Cervical Esophageal Phase    GO    Cervical Esophageal Phase Cervical Esophageal Phase: WFL         Crutchfield, Renee S 03/24/2013, 12:39 PM     Microbiology: Recent Results (from the past 240 hour(s))  URINE CULTURE     Status: None   Collection Time    03/22/13  4:10 PM      Result Value Range Status   Specimen Description URINE, CATHETERIZED   Final   Special Requests NONE   Final   Culture  Setup Time     Final   Value: 03/22/2013 18:50     Performed at [facility] Count     Final   Value: >=100,000 COLONIES/ML     Performed at [facility]   Culture     Final   Value: KLEBSIELLA PNEUMONIAE     Performed at [facility]   Report Status 03/24/2013 FINAL   Final   Organism ID, Bacteria KLEBSIELLA PNEUMONIAE   Final     Labs: Basic Metabolic Panel:  Recent Labs Lab 03/22/13 1248 03/23/13 0548 03/25/13 0553  NA 135 134* 137  K 4.0 3.8 3.6  CL 97 96 102  CO2 26 23 25   GLUCOSE 152* 124*  137*  BUN 18 15 14   CREATININE 0.73 0.66 0.61  CALCIUM 10.5 10.3 9.9   Liver Function Tests:  Recent Labs Lab 03/22/13 1248 03/23/13 0548  AST 19 18  ALT 14 13  ALKPHOS 127* 124*  BILITOT 0.3 0.3  PROT 8.0 7.8  ALBUMIN 3.9 3.6   No results found for this basename: LIPASE, AMYLASE,  in the last 168 hours No results found for this basename: AMMONIA,  in the last 168 hours CBC:  Recent Labs Lab 03/22/13 1248 03/23/13 0548  WBC 12.7* 10.2  NEUTROABS 9.5*  --   HGB 13.0 13.4  HCT 37.9 39.9  MCV 89.0 88.5  PLT 306 278   Cardiac Enzymes:  Recent Labs Lab 03/22/13 1248  TROPONINI <0.30   BNP: BNP (last 3 results)  Recent Labs  06/26/12 2320  PROBNP 150.2*   CBG:  Recent Labs Lab 03/24/13 1736 03/24/13 2000 03/25/13 0132 03/25/13 0418 03/25/13 0725  GLUCAP 110* 230* 196* 125* 129*       Signed:  Ashyra Cantin  [facility] 03/25/2013, 11:31 AM

## 2013-03-25 NOTE — Progress Notes (Signed)
Stroke Booklet provided to patient and patient's sister and provided education.  Verbalized understanding.  Teach back method used.

## 2013-03-27 ENCOUNTER — Non-Acute Institutional Stay (SKILLED_NURSING_FACILITY): Payer: Medicare Other | Admitting: Internal Medicine

## 2013-03-27 DIAGNOSIS — I420 Dilated cardiomyopathy: Secondary | ICD-10-CM

## 2013-03-27 DIAGNOSIS — I428 Other cardiomyopathies: Secondary | ICD-10-CM

## 2013-03-27 DIAGNOSIS — J449 Chronic obstructive pulmonary disease, unspecified: Secondary | ICD-10-CM

## 2013-03-27 DIAGNOSIS — I69959 Hemiplegia and hemiparesis following unspecified cerebrovascular disease affecting unspecified side: Secondary | ICD-10-CM

## 2013-03-28 ENCOUNTER — Other Ambulatory Visit: Payer: Self-pay | Admitting: *Deleted

## 2013-03-28 ENCOUNTER — Encounter: Payer: Self-pay | Admitting: Vascular Surgery

## 2013-03-28 LAB — GLUCOSE, CAPILLARY: Glucose-Capillary: 130 mg/dL — ABNORMAL HIGH (ref 70–99)

## 2013-03-29 ENCOUNTER — Encounter: Payer: Self-pay | Admitting: Vascular Surgery

## 2013-03-29 ENCOUNTER — Ambulatory Visit (INDEPENDENT_AMBULATORY_CARE_PROVIDER_SITE_OTHER): Payer: Medicare Other | Admitting: Vascular Surgery

## 2013-03-29 ENCOUNTER — Other Ambulatory Visit (INDEPENDENT_AMBULATORY_CARE_PROVIDER_SITE_OTHER): Payer: Medicare Other | Admitting: *Deleted

## 2013-03-29 ENCOUNTER — Other Ambulatory Visit: Payer: Self-pay | Admitting: *Deleted

## 2013-03-29 DIAGNOSIS — Z01810 Encounter for preprocedural cardiovascular examination: Secondary | ICD-10-CM

## 2013-03-29 DIAGNOSIS — I6529 Occlusion and stenosis of unspecified carotid artery: Secondary | ICD-10-CM

## 2013-03-29 LAB — GLUCOSE, CAPILLARY: Glucose-Capillary: 137 mg/dL — ABNORMAL HIGH (ref 70–99)

## 2013-03-29 NOTE — Progress Notes (Signed)
[facility] [place]  Patient name: Christine Durham MRN: [id number] DOB: 11/18/1940 Sex: female  REASON FOR CONSULT: evaluate left carotid stenosis. Referred by [facility]  HPI: Christine Durham is a 72 y.o. female who had a previous right brain stroke in 2011 associated with left upper extremity and left lower extremity weakness. She has been ambulatory with a walker. On 03/22/2013, she developed the sudden onset of expressive aphasia and facial drooping. She was admitted to  Hospital and underwent an extensive workup. Her workup included a carotid duplex scan which suggested a greater than 70% left internal carotid artery stenosis. She was referred for vascular consultation.  She has persistent left upper extremity and left lower extremity weakness related to her previous stroke from 2011. She states that her expressive aphasia has improved slightly. She is currently undergoing rehabilitation as an outpatient. She is markedly debilitated.  I have reviewed her records from the hospital. Her stroke was associated with expressive aphasia and also dysphagia. Her workup included a CT of the head on 03/22/2013 which showed no acute intracranial abnormality. There were old infarcts in moderate chronic microvascular ischemic changes. She also underwent an MRA of the head on 03/23/2013. This showed significant asymmetric signals in the internal carotid arteries bilaterally. It was not clear if this represented intracranial disease or if it was related to more proximal stenoses. The study was somewhat degraded by motion artifact. Also noted were moderate vertebral artery stenoses bilaterally.  She also had a carotid duplex scan that admission which showed a less than 50% right carotid stenosis with a greater than 70% left carotid stenosis.  Of note, approximately 10 days prior to this admission she had fallen and sustained a compression fracture to her lumbar spine and has  essentially been nonambulatory since that time. His accident was related to a fall and she did not have any precipitating neurologic changes prior to that.  Past Medical History  Diagnosis Date  . 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 Diabetes mellitus without complication   . Asthma   . HTN (hypertension)   . Tobacco abuse   . 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 COPD (chronic obstructive pulmonary disease)    The patient has a history of a nonischemic cardiomyopathy with an ejection fraction of 25% based on a cardiac catheterization from December of 2013. She did not have any obstructive coronary artery disease at that time.  Family History  Problem Relation Age of Onset  . Diabetes Sister   . Hyperlipidemia Sister   . Hypertension Sister    For VQI Use Only  PRE-ADM LIVING: [X ] Home- after her stroke in 2011, she moved from [place] to live with her sister., [ ] Nursing home, [ ] Homeless  AMB STATUS: [ ] Walking, [ ] Walking w/ Assistance, [X ] Wheelchair, [ ]Bed ridden  RECENT HEART ATTACK (<6 mon): No  CAD Sx: [X ] No, [ ] Asx, h/o MI, [ ] Stable angina, [ ] Unstable angina  PRIOR CHF: [X] No, [ ] Asx, [ ] Mild, [ ] Moderate, [ ] Severe  STRESS TEST: [X ] No, [ ] Normal, [ ] + ischemia, [ ] + MI, [ ] Both  SOCIAL HISTORY: History  Substance Use Topics  . Smoking status: Former Smoker    Quit date: 06/21/2010  . Smokeless tobacco: Not on file  . Alcohol Use: No      No Known Allergies  No current outpatient prescriptions on file.   No current facility-administered medications for this visit.   REVIEW OF SYSTEMS: [X ] denotes positive finding; [  ] denotes negative finding  CARDIOVASCULAR:  [ ] chest pain   [ ] chest pressure   [ ] palpitations   [ ] orthopnea   [X ] dyspnea on exertion   [ ] claudication   [ ] rest pain   [ ] DVT   [ ] phlebitis PULMONARY:   [X ] productive cough   [ ] asthma   [ ] wheezing NEUROLOGIC:   [X ] weakness left upper  extremity and left lower extremity  [X ] paresthesias left upper extremity and left lower extremity [ ] aphasia  [ ] amaurosis  [ ] dizziness HEMATOLOGIC:   [ ] bleeding problems   [ ] clotting disorders MUSCULOSKELETAL:  [ ] joint pain   [ ] joint swelling [ ] leg swelling GASTROINTESTINAL: [ ]  blood in stool  [ ]  hematemesis GENITOURINARY:  [ ]  dysuria  [ ]  hematuria PSYCHIATRIC:  [ ] history of major depression INTEGUMENTARY:  [ ] rashes  [ ] ulcers CONSTITUTIONAL:  [ ] fever   [ ] chills  PHYSICAL EXAM: Filed Vitals:   03/29/13 0922 03/29/13 0923  BP: 179/79 145/76  Pulse: 96   Height: 5' 6" (1.676 m)   Weight: 128 lb (58.06 kg)   SpO2: 97%    Body mass index is 20.67 kg/(m^2). GENERAL: The patient is a well-nourished female, in no acute distress. The vital signs are documented above. CARDIOVASCULAR: There is a regular rate and rhythm. I do not detect carotid bruits. She has palpable femoral pulses. I cannot palpate pedal pulses. Both feet appear adequately perfused. There is no significant lower extremity swelling. PULMONARY: There is good air exchange bilaterally without wheezing or rales. ABDOMEN: Soft and non-tender with normal pitched bowel sounds.  MUSCULOSKELETAL: There are no major deformities or cyanosis. NEUROLOGIC: she has significant expressive aphasia. She has significant left upper extremity and left lower extremity weakness. She has good strength on the right side. SKIN: There are no ulcers or rashes noted. PSYCHIATRIC: She is somewhat lethargic but has taken pain medicine earlier today.  DATA:  I have independently interpreted the duplex scan of the left carotid system which was done in our office today. This shows a very tight left carotid stenosis. The disease appears to extend distally in the internal carotid artery stenosis distally is not well visualized.  MEDICAL ISSUES: This patient has had a recent left brain stroke associated with expressive aphasia.  Based on the results of the MRA done in the hospital, and the duplex scan done in our office, the patient may not be a candidate for left carotid endarterectomy for carotid stenting given the extent of the disease. The distal internal carotid artery could not be visualized and she may have a distal occlusion. I have recommended that we proceed with cerebral arteriography to determine if the distal internal carotid artery is patent. She is markedly debilitated and I think it would be reasonable to wait until she has completed her rehabilitation before proceeding with cerebral arteriography. If still stenosis on the left does appear to be correctable, given her debilitated state, she might better be served with carotid stenting if she is a candidate from an anatomic standpoint. She is on aspirin and Plavix and also Lipitor. She quit tobacco in 2011. We will   schedule her cerebral arteriogram once her rehabilitation is complete. I have discussed the indications for cerebral arteriography. I've also discussed the potential complications of the procedure, including but not limited to: Bleeding, arterial injury, stroke (1%. Procedural risk), renal insufficiency, or other unpredictable medical problems. All the patient's questions were answered and they are agreeable to proceed.  DICKSON,CHRISTOPHER S [facility] [place] Beeper: [phone]    

## 2013-03-30 LAB — GLUCOSE, CAPILLARY: Glucose-Capillary: 112 mg/dL — ABNORMAL HIGH (ref 70–99)

## 2013-04-01 LAB — GLUCOSE, CAPILLARY: Glucose-Capillary: 119 mg/dL — ABNORMAL HIGH (ref 70–99)

## 2013-04-02 LAB — GLUCOSE, CAPILLARY: Glucose-Capillary: 113 mg/dL — ABNORMAL HIGH (ref 70–99)

## 2013-04-03 ENCOUNTER — Other Ambulatory Visit: Payer: Self-pay | Admitting: *Deleted

## 2013-04-03 MED ORDER — OXYCODONE-ACETAMINOPHEN 5-325 MG PO TABS: ORAL_TABLET | ORAL | Status: DC

## 2013-04-03 NOTE — Progress Notes (Signed)
Patient ID: Christine Durham, female   DOB: December 21, 1940, 72 y.o.   MRN: [id number]           HISTORY & PHYSICAL  DATE:  03/27/2013  FACILITY: [facility]   LEVEL OF CARE:   SNF   CHIEF COMPLAINT:  Admission to SNF, post stay at [facility], 03/22/2013 through 03/25/2013.    HISTORY OF PRESENT ILLNESS:  This is a 72 year-old woman who has a past history of presumably a right-sided stroke in 2011 with left-sided deficits including left arm and left leg weakness.  Nevertheless, she tells me she was functional and ambulatory without an assist device and independent with ADLs.  She was diagnosed with a lumbar compression fracture at the end of August after a mechanical fall.    Nevertheless, she was doing well until the day of admission when she started having difficulty with slurring of her speech.  She was also having difficulty with eating and drinking.  There was a delay in bringing her to the attention of the emergency room.  She was admitted with dysarthria and dysphagia.  She was found to have an acute left-sided stroke.  MRI showed an acute non-hemorrhagic left frontal parietal infarct, a remote left caudate nucleus infarct which was partially hemorrhagic.  She had remote thalamic and basal ganglia infarcts, marked small vessel disease.  Also noted was abnormal appearance of the right internal carotid artery.  Carotid ultrasound showed a 70% stenosis in the left internal carotid artery.  The patient has been referred to Vascular Surgery and has an appointment on 03/29/2013.  Echocardiogram showed an EF within the normal range.  She was seen by Speech Therapy and recommendation for a pureed diet and nectar-thick liquids.    PAST MEDICAL HISTORY/PROBLEM LIST:  Acute-on-chronic cerebrovascular disease with new left hemisphere stroke.  This is in the left frontal parietal area.    Type 2 diabetes.    Nonischemic cardiomyopathy.    History of tobacco abuse.    COPD.    Dysphagia.     UTI.    CURRENT MEDICATIONS:  Discharge medications include:      The patient was on Aggrenox prior to admission.  This was changed to a combination of Plavix and aspirin for three months.    Other than that, medications included:  Proventil 2 puffs q.6 p.r.n.    ASA 81 q.d.    Atorvastatin 10 mg, 2 tablets/20 mg q.h.s.    Ciprofloxacin 250 b.i.d. for two more days.    Plavix 75 q.d.    Donepezil 5 q.d.    Lipitor 5 q.d.    Metformin 500 b.i.d.    Percocet 5/325, 2 tablets q.4 p.r.n.    SOCIAL HISTORY:  I had some difficulty with this.   HOUSING:  The patient states she was living with her sister.  Came down from [place] after the previous stroke in 2011.   FUNCTIONAL STATUS:  She claims to be independent with ADLs.  Was not using an ambulatory assist device.   TOBACCO USE:  Quit smoking after the first stroke in 2011.    FAMILY HISTORY:  None described by the patient.    REVIEW OF SYSTEMS:   CHEST/RESPIRATORY:  Patient does not describe shortness of breath.   CARDIAC:   No chest pain.  No palpitations.   GI:  No nausea,  vomiting or abdominal pain.   GU:  She does not describe prior incontinence or voiding difficulties.   NEUROLOGICAL:  She states her previous deficits were in the left arm predominantly.  The dysphagia and dysarthria are new, by description of the patient.    PHYSICAL EXAMINATION:   GENERAL APPEARANCE:  The patient is in no distress.  She has thinning of her hair and eyebrows.  HEENT:   MOUTH/THROAT:   Poor dentition.  No oral lesions seen.   EYES:  Visual fields are intact to confrontation.    CHEST/RESPIRATORY:  Shallow air entry bilaterally.  No crackles or wheezes.  There is probable significant aspiration, just watching her attempt to eat.    CARDIOVASCULAR:  CARDIAC:   Heart sounds are normal.  There are no murmurs.  No gallops.  No carotid bruits.   GASTROINTESTINAL:  LIVER/SPLEEN/KIDNEYS:  No liver, no spleen.  No tenderness.    GENITOURINARY:  BLADDER:   No bladder distention.  No CVA tenderness.   NEUROLOGICAL:    CRANIAL NERVES:  Her cranial nerves are intact.   SENSATION/STRENGTH:  She has mild left arm pronator drift and some mild decreased strength on the left side versus the right.  She is also hyperreflexic on the left with an upgoing toe on the left.  Her strength on the right side actually seems fairly good.   OTHER:  She is dysarthric in her speech and probably has significant aspiration at the bedside.   BALANCE/GAIT:  I did not attempt to ambulate her.    ASSESSMENT/PLAN:  New left hemisphere stroke on the background of a remote right-sided stroke.  I will actually need to check the MRI report here.  I have significant concerns of dysphagia at the bedside and would like her upright in a chair for all meals.  She is now on a combination of Plavix and aspirin for three months, at which time I think Plavix would be the correct choice.   She is supposed to see Vascular Surgery and I will make sure the facility knows about these orders.    Type 2 diabetes.  On oral agents.  I do not see a hemoglobin A1c on her.  I will try to look this up.  Her blood sugar was as high as 230 on 03/24/2013.    Cardiomyopathy, which is listed as nonischemic.  There are no signs of heart failure at the bedside.  Her echocardiogram showed normal left ventricular function.    COPD.  She quit smoking three years ago.  The other issue here is that she has significant digital clubbing.  Probably needs a CT scan of the chest.  I will review her chest x-rays.    This patient will need aggressive OT and PT.  Verification of her previous functional status would be helpful.    With regards to her internal carotid disease, she is due to see Dr. Edilia Bo on 03/29/2013.    CPT CODE: 40981

## 2013-04-05 ENCOUNTER — Other Ambulatory Visit: Payer: Self-pay | Admitting: *Deleted

## 2013-04-05 ENCOUNTER — Encounter (HOSPITAL_COMMUNITY): Payer: Self-pay | Admitting: Pharmacy Technician

## 2013-04-10 ENCOUNTER — Ambulatory Visit (HOSPITAL_COMMUNITY)
Admission: RE | Admit: 2013-04-10 | Discharge: 2013-04-10 | Disposition: A | Discharge status: Home / Self Care | Payer: Medicare Other | Source: Ambulatory Visit | Attending: Vascular Surgery | Admitting: Vascular Surgery

## 2013-04-10 ENCOUNTER — Inpatient Hospital Stay
Admission: RE | Admit: 2013-04-10 | Discharge: 2013-04-12 | Disposition: A | Discharge status: Nursing Home (Includes ICF) | Payer: Medicare Other | Source: Ambulatory Visit | Attending: Internal Medicine | Admitting: Internal Medicine

## 2013-04-10 ENCOUNTER — Encounter (HOSPITAL_COMMUNITY)
Admission: RE | Disposition: A | Discharge status: Home / Self Care | Payer: Self-pay | Source: Ambulatory Visit | Attending: Vascular Surgery

## 2013-04-10 DIAGNOSIS — R05 Cough: Principal | ICD-10-CM

## 2013-04-10 DIAGNOSIS — R29898 Other symptoms and signs involving the musculoskeletal system: Secondary | ICD-10-CM | POA: Insufficient documentation

## 2013-04-10 DIAGNOSIS — J441 Chronic obstructive pulmonary disease with (acute) exacerbation: Secondary | ICD-10-CM | POA: Diagnosis present

## 2013-04-10 DIAGNOSIS — I959 Hypotension, unspecified: Secondary | ICD-10-CM | POA: Diagnosis present

## 2013-04-10 DIAGNOSIS — I10 Essential (primary) hypertension: Secondary | ICD-10-CM | POA: Diagnosis present

## 2013-04-10 DIAGNOSIS — I69998 Other sequelae following unspecified cerebrovascular disease: Secondary | ICD-10-CM | POA: Insufficient documentation

## 2013-04-10 DIAGNOSIS — I69920 Aphasia following unspecified cerebrovascular disease: Secondary | ICD-10-CM | POA: Diagnosis present

## 2013-04-10 DIAGNOSIS — I6350 Cerebral infarction due to unspecified occlusion or stenosis of unspecified cerebral artery: Principal | ICD-10-CM | POA: Diagnosis present

## 2013-04-10 DIAGNOSIS — J449 Chronic obstructive pulmonary disease, unspecified: Secondary | ICD-10-CM | POA: Insufficient documentation

## 2013-04-10 DIAGNOSIS — I639 Cerebral infarction, unspecified: Secondary | ICD-10-CM

## 2013-04-10 DIAGNOSIS — I428 Other cardiomyopathies: Secondary | ICD-10-CM | POA: Diagnosis present

## 2013-04-10 DIAGNOSIS — I63239 Cerebral infarction due to unspecified occlusion or stenosis of unspecified carotid arteries: Secondary | ICD-10-CM

## 2013-04-10 DIAGNOSIS — E119 Type 2 diabetes mellitus without complications: Secondary | ICD-10-CM | POA: Diagnosis present

## 2013-04-10 DIAGNOSIS — E876 Hypokalemia: Secondary | ICD-10-CM | POA: Diagnosis present

## 2013-04-10 DIAGNOSIS — F0390 Unspecified dementia without behavioral disturbance: Secondary | ICD-10-CM | POA: Diagnosis present

## 2013-04-10 DIAGNOSIS — G8190 Hemiplegia, unspecified affecting unspecified side: Secondary | ICD-10-CM | POA: Diagnosis present

## 2013-04-10 DIAGNOSIS — J4489 Other specified chronic obstructive pulmonary disease: Secondary | ICD-10-CM | POA: Insufficient documentation

## 2013-04-10 DIAGNOSIS — Z7902 Long term (current) use of antithrombotics/antiplatelets: Secondary | ICD-10-CM

## 2013-04-10 DIAGNOSIS — I252 Old myocardial infarction: Secondary | ICD-10-CM

## 2013-04-10 DIAGNOSIS — R5381 Other malaise: Secondary | ICD-10-CM | POA: Diagnosis present

## 2013-04-10 DIAGNOSIS — E870 Hyperosmolality and hypernatremia: Secondary | ICD-10-CM | POA: Diagnosis present

## 2013-04-10 DIAGNOSIS — I6529 Occlusion and stenosis of unspecified carotid artery: Secondary | ICD-10-CM | POA: Insufficient documentation

## 2013-04-10 DIAGNOSIS — Z87891 Personal history of nicotine dependence: Secondary | ICD-10-CM

## 2013-04-10 DIAGNOSIS — Z79899 Other long term (current) drug therapy: Secondary | ICD-10-CM

## 2013-04-10 HISTORY — PX: CAROTID ANGIOGRAM: SHX5504

## 2013-04-10 LAB — POCT I-STAT, CHEM 8
BUN: 6 mg/dL (ref 6–23)
Chloride: 102 mEq/L (ref 96–112)
Sodium: 138 mEq/L (ref 135–145)

## 2013-04-10 LAB — GLUCOSE, CAPILLARY
Glucose-Capillary: 135 mg/dL — ABNORMAL HIGH (ref 70–99)
Glucose-Capillary: 164 mg/dL — ABNORMAL HIGH (ref 70–99)

## 2013-04-10 SURGERY — CAROTID ANGIOGRAM
Anesthesia: LOCAL

## 2013-04-10 MED ORDER — ACETAMINOPHEN 325 MG PO TABS: 650.0000 mg | ORAL_TABLET | ORAL | Status: DC | PRN

## 2013-04-10 MED ORDER — OXYCODONE-ACETAMINOPHEN 5-325 MG PO TABS: 1.0000 | ORAL_TABLET | ORAL | Status: DC | PRN

## 2013-04-10 MED ORDER — ONDANSETRON HCL 4 MG/2ML IJ SOLN: 4.0000 mg | Freq: Four times a day (QID) | INTRAMUSCULAR | Status: DC | PRN

## 2013-04-10 MED ORDER — SODIUM CHLORIDE 0.9 % IV SOLN: 1.0000 mL/kg/h | INTRAVENOUS | Status: DC

## 2013-04-10 MED ORDER — SODIUM CHLORIDE 0.9 % IV SOLN
INTRAVENOUS | Status: DC
  Administered 2013-04-10: 10:00:00 via INTRAVENOUS

## 2013-04-10 NOTE — Progress Notes (Signed)
Blood sent to lab for stat BMET.  Results given to [facility]

## 2013-04-10 NOTE — Op Note (Signed)
PATIENT: Christine Durham   MRN: [id number] DOB: [birth year]    DATE OF PROCEDURE: 04/10/2013  INDICATIONS: Christine Durham is a 72 y.o. female who had a recent left brain stroke. Duplex scan and MRI suggested possible distal occlusion of the left internal carotid artery she is brought in for diagnostic cerebral arteriogram.  PROCEDURE:  1. Ultrasound-guided access to the right common femoral artery 2. Arch aortogram 3. Selective catheterization of the right common carotid artery with right carotid arteriogram 4. Selective catheterization of the left common carotid artery with left carotid arteriogram  SURGEON: Di Kindle. Edilia Bo, MD, FACS  ANESTHESIA: local   EBL: minimal  TECHNIQUE: The patient was brought to the peripheral vascular lab. Both groins were prepped and draped in usual sterile fashion. Under ultrasound guidance, after the skin was anesthetized, the right common femoral artery was cannulated and a guidewire introduced into the infrarenal aorta under fluoroscopic control. A 5 French sheath was introduced over the wire. A long pigtail catheter was positioned in the ascending aortic arch and an arch aortogram was obtained a 40 LAO projection. This catheter was exchanged for an H1 catheter which was positioned into the innominate artery. It was able to advance the wire into the common carotid artery and then the catheter was advanced over the wire. Selective right common carotid arteriogram was obtained with both intracranial and extracranial views. (The intracranial views will be interpreted separately by the neuroradiologist). I then exchanged the H1 catheter for someone catheter. This was placed into the common carotid artery. Of note the patient had a bovine arch. Elective left common carotid arteriogram is obtained with both intracranial and extracranial views. At the completion of the procedure the catheter was removed over a wire. The patient was transferred to the holding area for  removal of the sheath. No immediate complications were noted.  FINDINGS:  1. There is no significant atherosclerotic disease of the aortic arch. The innominate artery, right subclavian artery, right vertebral artery, left subclavian artery, left vertebral arteries are all patent.  2. On the left side, which is the symptomatic side, the common carotid artery and external carotid arteries are patent. 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 3. On the right side, the common carotid artery, external carotid artery, and internal carotid artery are patent. There is no stenosis of the right carotid bifurcation or internal carotid artery extracranially on the right.  [name], MD, FACS [facility]  DATE OF DICTATION:   04/10/2013

## 2013-04-10 NOTE — H&P (View-Only) (Signed)
[facility] [place]  Patient name: Christine Durham MRN: [id number] DOB: 1940-12-29 Sex: female  REASON FOR CONSULT: evaluate left carotid stenosis. Referred by [facility]  HPI: Christine Durham is a 72 y.o. female who had a previous right brain stroke in 2011 associated with left upper extremity and left lower extremity weakness. She has been ambulatory with a walker. On 03/22/2013, she developed the sudden onset of expressive aphasia and facial drooping. She was admitted to [facility] and underwent an extensive workup. Her workup included a carotid duplex scan which suggested a greater than 70% left internal carotid artery stenosis. She was referred for vascular consultation.  She has persistent left upper extremity and left lower extremity weakness related to her previous stroke from 2011. She states that her expressive aphasia has improved slightly. She is currently undergoing rehabilitation as an outpatient. She is markedly debilitated.  I have reviewed her records from the hospital. Her stroke was associated with expressive aphasia and also dysphagia. Her workup included a CT of the head on 03/22/2013 which showed no acute intracranial abnormality. There were old infarcts in moderate chronic microvascular ischemic changes. She also underwent an MRA of the head on 03/23/2013. This showed significant asymmetric signals in the internal carotid arteries bilaterally. It was not clear if this represented intracranial disease or if it was related to more proximal stenoses. The study was somewhat degraded by motion artifact. Also noted were moderate vertebral artery stenoses bilaterally.  She also had a carotid duplex scan that admission which showed a less than 50% right carotid stenosis with a greater than 70% left carotid stenosis.  Of note, approximately 10 days prior to this admission she had fallen and sustained a compression fracture to her lumbar spine and has  essentially been nonambulatory since that time. His accident was related to a fall and she did not have any precipitating neurologic changes prior to that.  Past Medical History  Diagnosis Date  . 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 Diabetes mellitus without complication   . Asthma   . HTN (hypertension)   . Tobacco abuse   . 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 COPD (chronic obstructive pulmonary disease)    The patient has a history of a nonischemic cardiomyopathy with an ejection fraction of 25% based on a cardiac catheterization from December of 2013. She did not have any obstructive coronary artery disease at that time.  Family History  Problem Relation Age of Onset  . Diabetes Sister   . Hyperlipidemia Sister   . Hypertension Sister    For VQI Use Only  PRE-ADM LIVING: Christine Durham ] Home- after her stroke in 2011, she moved from [place] to live with her sister., [ ]  Nursing home, [ ]  Homeless  AMB STATUS: [ ]  Walking, [ ]  Walking w/ Assistance, Christine Durham ] Wheelchair, [ ] Bed ridden  RECENT HEART ATTACK (<6 mon): No  CAD Sx: Christine Durham ] No, [ ]  Asx, h/o MI, [ ]  Stable angina, [ ]  Unstable angina  PRIOR CHF: [X]  No, [ ]  Asx, [ ]  Mild, [ ]  Moderate, [ ]  Severe  STRESS TEST: Christine Durham ] No, [ ]  Normal, [ ]  + ischemia, [ ]  + MI, [ ]  Both  SOCIAL HISTORY: History  Substance Use Topics  . Smoking status: Former Smoker    Quit date: 06/21/2010  . Smokeless tobacco: Not on file  . Alcohol Use: No  No Known Allergies  No current outpatient prescriptions on file.   No current facility-administered medications for this visit.   REVIEW OF SYSTEMS: Christine Durham ] denotes positive finding; [  ] denotes negative finding  CARDIOVASCULAR:  [ ]  chest pain   [ ]  chest pressure   [ ]  palpitations   [ ]  orthopnea   Christine Durham ] dyspnea on exertion   [ ]  claudication   [ ]  rest pain   [ ]  DVT   [ ]  phlebitis PULMONARY:   Christine Durham ] productive cough   [ ]  asthma   [ ]  wheezing NEUROLOGIC:   Christine Durham ] weakness left upper  extremity and left lower extremity  Christine Durham ] paresthesias left upper extremity and left lower extremity [ ]  aphasia  [ ]  amaurosis  [ ]  dizziness HEMATOLOGIC:   [ ]  bleeding problems   [ ]  clotting disorders MUSCULOSKELETAL:  [ ]  joint pain   [ ]  joint swelling [ ]  leg swelling GASTROINTESTINAL: [ ]   blood in stool  [ ]   hematemesis GENITOURINARY:  [ ]   dysuria  [ ]   hematuria PSYCHIATRIC:  [ ]  history of major depression INTEGUMENTARY:  [ ]  rashes  [ ]  ulcers CONSTITUTIONAL:  [ ]  fever   [ ]  chills  PHYSICAL EXAM: Filed Vitals:   03/29/13 0922 03/29/13 0923  BP: 179/79 145/76  Pulse: 96   Height: 5\' 6"  (1.676 m)   Weight: 128 lb (58.06 kg)   SpO2: 97%    Body mass index is 20.67 kg/(m^2). GENERAL: The patient is a well-nourished female, in no acute distress. The vital signs are documented above. CARDIOVASCULAR: There is a regular rate and rhythm. I do not detect carotid bruits. She has palpable femoral pulses. I cannot palpate pedal pulses. Both feet appear adequately perfused. There is no significant lower extremity swelling. PULMONARY: There is good air exchange bilaterally without wheezing or rales. ABDOMEN: Soft and non-tender with normal pitched bowel sounds.  MUSCULOSKELETAL: There are no major deformities or cyanosis. NEUROLOGIC: she has significant expressive aphasia. She has significant left upper extremity and left lower extremity weakness. She has good strength on the right side. SKIN: There are no ulcers or rashes noted. PSYCHIATRIC: She is somewhat lethargic but has taken pain medicine earlier today.  DATA:  I have independently interpreted the duplex scan of the left carotid system which was done in our office today. This shows a very tight left carotid stenosis. The disease appears to extend distally in the internal carotid artery stenosis distally is not well visualized.  MEDICAL ISSUES: This patient has had a recent left brain stroke associated with expressive aphasia.  Based on the results of the MRA done in the hospital, and the duplex scan done in our office, the patient may not be a candidate for left carotid endarterectomy for carotid stenting given the extent of the disease. The distal internal carotid artery could not be visualized and she may have a distal occlusion. I have recommended that we proceed with cerebral arteriography to determine if the distal internal carotid artery is patent. She is markedly debilitated and I think it would be reasonable to wait until she has completed her rehabilitation before proceeding with cerebral arteriography. If still stenosis on the left does appear to be correctable, given her debilitated state, she might better be served with carotid stenting if she is a candidate from an anatomic standpoint. She is on aspirin and Plavix and also Lipitor. She quit tobacco in 2011. We will  schedule her cerebral arteriogram once her rehabilitation is complete. I have discussed the indications for cerebral arteriography. I've also discussed the potential complications of the procedure, including but not limited to: Bleeding, arterial injury, stroke (1%. Procedural risk), renal insufficiency, or other unpredictable medical problems. All the patient's questions were answered and they are agreeable to proceed.  Farid Grigorian S [facility] [place] Beeper: [phone]

## 2013-04-10 NOTE — Interval H&P Note (Signed)
History and Physical Interval Note:  04/10/2013 9:40 AM  Christine Durham  has presented today for surgery, with the diagnosis of carotid stenosis  The various methods of treatment have been discussed with the patient and family. After consideration of risks, benefits and other options for treatment, the patient has consented to  Procedure(s): CAROTID ANGIOGRAM (N/A) as a surgical intervention .  The patient's history has been reviewed, patient examined, no change in status, stable for surgery.  I have reviewed the patient's chart and labs.  Questions were answered to the patient's satisfaction.     Marquies Wanat S

## 2013-04-10 NOTE — Progress Notes (Addendum)
Assumed care of pt from Christine Melia, RN. Assessment done. No changes noted.

## 2013-04-11 ENCOUNTER — Ambulatory Visit (HOSPITAL_COMMUNITY)
Admit: 2013-04-11 | Discharge: 2013-04-11 | Disposition: A | Discharge status: Home / Self Care | Payer: Medicare Other | Source: Ambulatory Visit | Attending: Internal Medicine | Admitting: Internal Medicine

## 2013-04-11 DIAGNOSIS — R059 Cough, unspecified: Secondary | ICD-10-CM | POA: Insufficient documentation

## 2013-04-11 DIAGNOSIS — R05 Cough: Secondary | ICD-10-CM | POA: Insufficient documentation

## 2013-04-12 ENCOUNTER — Emergency Department (HOSPITAL_COMMUNITY): Payer: Medicare Other

## 2013-04-12 ENCOUNTER — Encounter (HOSPITAL_COMMUNITY): Payer: Self-pay | Admitting: *Deleted

## 2013-04-12 ENCOUNTER — Inpatient Hospital Stay (HOSPITAL_COMMUNITY)
Admission: EM | Admit: 2013-04-12 | Discharge: 2013-04-18 | DRG: 065 | Disposition: A | Discharge status: Home Health | Payer: Medicare Other | Attending: Internal Medicine | Admitting: Internal Medicine

## 2013-04-12 ENCOUNTER — Inpatient Hospital Stay (HOSPITAL_COMMUNITY): Payer: Medicare Other

## 2013-04-12 ENCOUNTER — Non-Acute Institutional Stay (SKILLED_NURSING_FACILITY): Payer: Medicare Other | Admitting: Internal Medicine

## 2013-04-12 DIAGNOSIS — Z720 Tobacco use: Secondary | ICD-10-CM

## 2013-04-12 DIAGNOSIS — R1310 Dysphagia, unspecified: Secondary | ICD-10-CM

## 2013-04-12 DIAGNOSIS — J449 Chronic obstructive pulmonary disease, unspecified: Secondary | ICD-10-CM | POA: Diagnosis present

## 2013-04-12 DIAGNOSIS — I428 Other cardiomyopathies: Secondary | ICD-10-CM

## 2013-04-12 DIAGNOSIS — E119 Type 2 diabetes mellitus without complications: Secondary | ICD-10-CM | POA: Diagnosis present

## 2013-04-12 DIAGNOSIS — N390 Urinary tract infection, site not specified: Secondary | ICD-10-CM

## 2013-04-12 DIAGNOSIS — I959 Hypotension, unspecified: Secondary | ICD-10-CM | POA: Diagnosis present

## 2013-04-12 DIAGNOSIS — J9600 Acute respiratory failure, unspecified whether with hypoxia or hypercapnia: Secondary | ICD-10-CM

## 2013-04-12 DIAGNOSIS — R5381 Other malaise: Secondary | ICD-10-CM

## 2013-04-12 DIAGNOSIS — E870 Hyperosmolality and hypernatremia: Secondary | ICD-10-CM

## 2013-04-12 DIAGNOSIS — I5023 Acute on chronic systolic (congestive) heart failure: Secondary | ICD-10-CM

## 2013-04-12 DIAGNOSIS — IMO0001 Reserved for inherently not codable concepts without codable children: Secondary | ICD-10-CM

## 2013-04-12 DIAGNOSIS — I214 Non-ST elevation (NSTEMI) myocardial infarction: Secondary | ICD-10-CM

## 2013-04-12 DIAGNOSIS — J690 Pneumonitis due to inhalation of food and vomit: Secondary | ICD-10-CM

## 2013-04-12 DIAGNOSIS — I6350 Cerebral infarction due to unspecified occlusion or stenosis of unspecified cerebral artery: Principal | ICD-10-CM

## 2013-04-12 DIAGNOSIS — J45909 Unspecified asthma, uncomplicated: Secondary | ICD-10-CM

## 2013-04-12 DIAGNOSIS — I69959 Hemiplegia and hemiparesis following unspecified cerebrovascular disease affecting unspecified side: Secondary | ICD-10-CM

## 2013-04-12 DIAGNOSIS — R1314 Dysphagia, pharyngoesophageal phase: Secondary | ICD-10-CM

## 2013-04-12 DIAGNOSIS — I639 Cerebral infarction, unspecified: Secondary | ICD-10-CM

## 2013-04-12 LAB — CBC WITH DIFFERENTIAL/PLATELET
Basophils Absolute: 0 10*3/uL (ref 0.0–0.1)
Basophils Relative: 0 % (ref 0–1)
Eosinophils Relative: 2 % (ref 0–5)
HCT: 37.9 % (ref 36.0–46.0)
Hemoglobin: 12.8 g/dL (ref 12.0–15.0)
Lymphocytes Relative: 24 % (ref 12–46)
Lymphs Abs: 3.2 10*3/uL (ref 0.7–4.0)
MCHC: 33.8 g/dL (ref 30.0–36.0)
MCV: 89.4 fL (ref 78.0–100.0)
Monocytes Relative: 9 % (ref 3–12)
Neutro Abs: 8.7 10*3/uL — ABNORMAL HIGH (ref 1.7–7.7)
Platelets: 312 10*3/uL (ref 150–400)
RDW: 13 % (ref 11.5–15.5)
WBC: 13.4 10*3/uL — ABNORMAL HIGH (ref 4.0–10.5)

## 2013-04-12 LAB — COMPREHENSIVE METABOLIC PANEL
Albumin: 4.1 g/dL (ref 3.5–5.2)
Alkaline Phosphatase: 127 U/L — ABNORMAL HIGH (ref 39–117)
BUN: 14 mg/dL (ref 6–23)
Chloride: 99 mEq/L (ref 96–112)
Creatinine, Ser: 0.64 mg/dL (ref 0.50–1.10)
GFR calc Af Amer: >90 mL/min (ref >90)
GFR calc non Af Amer: 88 mL/min — ABNORMAL LOW (ref >90)
Glucose, Bld: 136 mg/dL — ABNORMAL HIGH (ref 70–99)
Potassium: 4.1 mEq/L (ref 3.5–5.1)
Total Bilirubin: 0.3 mg/dL (ref 0.3–1.2)

## 2013-04-12 LAB — GLUCOSE, CAPILLARY: Glucose-Capillary: 121 mg/dL — ABNORMAL HIGH (ref 70–99)

## 2013-04-12 LAB — APTT: aPTT: 34 seconds (ref 24–37)

## 2013-04-12 LAB — PROTIME-INR: Prothrombin Time: 13.7 seconds (ref 11.6–15.2)

## 2013-04-12 MED ORDER — IOHEXOL 300 MG/ML  SOLN: 80.0000 mL | Freq: Once | INTRAMUSCULAR | Status: AC | PRN

## 2013-04-12 MED ORDER — PIPERACILLIN-TAZOBACTAM 3.375 G IVPB
INTRAVENOUS | Status: AC
  Filled 2013-04-12: qty 100

## 2013-04-12 MED ORDER — VANCOMYCIN HCL IN DEXTROSE 1-5 GM/200ML-% IV SOLN
INTRAVENOUS | Status: AC
  Filled 2013-04-12: qty 200

## 2013-04-12 MED ORDER — VANCOMYCIN HCL IN DEXTROSE 1-5 GM/200ML-% IV SOLN
1000.0000 mg | Freq: Once | INTRAVENOUS | Status: AC
  Administered 2013-04-12: 1000 mg via INTRAVENOUS
  Filled 2013-04-12: qty 200

## 2013-04-12 MED ORDER — ASPIRIN 300 MG RE SUPP
300.0000 mg | Freq: Every day | RECTAL | Status: DC
  Filled 2013-04-12 (×6): qty 1

## 2013-04-12 MED ORDER — PIPERACILLIN-TAZOBACTAM 3.375 G IVPB
3.3750 g | Freq: Three times a day (TID) | INTRAVENOUS | Status: DC
  Administered 2013-04-13 – 2013-04-14 (×5): 3.375 g via INTRAVENOUS
  Filled 2013-04-12 (×5): qty 50

## 2013-04-12 MED ORDER — SODIUM CHLORIDE 0.9 % IV SOLN
INTRAVENOUS | Status: DC
  Administered 2013-04-12: 1000 mL via INTRAVENOUS
  Administered 2013-04-13 – 2013-04-15 (×3): via INTRAVENOUS

## 2013-04-12 MED ORDER — ASPIRIN 325 MG PO TABS
325.0000 mg | ORAL_TABLET | Freq: Every day | ORAL | Status: DC
  Administered 2013-04-13 – 2013-04-18 (×6): 325 mg via ORAL
  Filled 2013-04-12 (×7): qty 1

## 2013-04-12 MED ORDER — INSULIN ASPART 100 UNIT/ML ~~LOC~~ SOLN
0.0000 [IU] | SUBCUTANEOUS | Status: DC
  Administered 2013-04-13: 2 [IU] via SUBCUTANEOUS
  Administered 2013-04-13 – 2013-04-14 (×6): 1 [IU] via SUBCUTANEOUS
  Administered 2013-04-15: 3 [IU] via SUBCUTANEOUS
  Administered 2013-04-15 (×4): 1 [IU] via SUBCUTANEOUS
  Administered 2013-04-15: 2 [IU] via SUBCUTANEOUS
  Administered 2013-04-16: 5 [IU] via SUBCUTANEOUS
  Administered 2013-04-16: 1 [IU] via SUBCUTANEOUS
  Administered 2013-04-16: 3 [IU] via SUBCUTANEOUS
  Administered 2013-04-17 (×3): 2 [IU] via SUBCUTANEOUS
  Administered 2013-04-18 (×2): 1 [IU] via SUBCUTANEOUS

## 2013-04-12 MED ORDER — ENOXAPARIN SODIUM 40 MG/0.4ML ~~LOC~~ SOLN
40.0000 mg | SUBCUTANEOUS | Status: DC
  Administered 2013-04-13 – 2013-04-18 (×6): 40 mg via SUBCUTANEOUS
  Filled 2013-04-12 (×6): qty 0.4

## 2013-04-12 NOTE — H&P (Signed)
[facility] History and Physical  MARYLOUISE MALLET  ZOX:[id number]  DOB: 07-03-1941   DOA: 04/12/2013   PCP:   Ignatius Specking., MD   Chief Complaint:  Coughing and worsening difficulty swallowing since yesterday  HPI: Christine Durham is a 72 y.o. female.   [name], with some dementia, and past history of stroke, admitted to [facility] on September 10 with acute left hemispheric stroke, manifesting as dysphagia dysarthria and right hemiplegia. MRI/MRA showed stenosis of the carotid arteries. Plavix was added to her regimen and she was discharged to skilled nursing facility on a pured diet  for rehabilitation. 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 She was started on Avelox yesterday.  There is no clear history of fever nor chills however patient's dementia makes her history unreliable     Rewiew of Systems:   Unable to obtain because of dysarthria and dementia   Past Medical History  Diagnosis Date  . 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 Diabetes mellitus without complication   . Asthma   . HTN (hypertension)   . Tobacco abuse   . 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 COPD (chronic obstructive pulmonary disease)     Past Surgical History  Procedure Laterality Date  . Appendectomy      Medications:  HOME MEDS: Prior to Admission medications   Medication Sig Start Date End Date Taking? Authorizing Provider  aspirin EC 81 MG tablet Take 1 tablet (81 mg total) by mouth daily. 03/25/13  Yes Erick Blinks, MD  atorvastatin (LIPITOR) 10 MG tablet Take 2 tablets (20 mg total) by mouth at bedtime. 03/25/13  Yes Erick Blinks, MD  clopidogrel (PLAVIX) 75 MG tablet Take 1 tablet (75 mg  total) by mouth daily with breakfast. 03/25/13  Yes Erick Blinks, MD  donepezil (ARICEPT) 5 MG tablet Take 5 mg by mouth at bedtime.   Yes Historical Provider, MD  guaiFENesin (MUCINEX) 600 MG 12 hr tablet Take 600 mg by mouth 2 (two) times daily. 5 day course   Yes Historical Provider, MD  lisinopril (PRINIVIL,ZESTRIL) 5 MG tablet Take 5 mg by mouth daily.   Yes Historical Provider, MD  metFORMIN (GLUCOPHAGE) 500 MG tablet Take 1 tablet (500 mg total) by mouth 2 (two) times daily with a meal. 07/08/12  Yes Daniel J Angiulli, PA-C  moxifloxacin (AVELOX) 400 MG tablet Take 400 mg by mouth daily. 5 day course starting on 04/11/2013   Yes Historical Provider, MD  oxyCODONE-acetaminophen (PERCOCET/ROXICET) 5-325 MG per tablet Take 2 tablets by mouth every 4 (four) hours as needed for pain.   Yes Historical Provider, MD  albuterol (PROVENTIL HFA;VENTOLIN HFA) 108 (90 BASE) MCG/ACT inhaler Inhale 2 puffs into the lungs every 6 (six) hours as needed for wheezing. 07/04/12   Joline Salt Barrett, PA-C     Allergies:  No Known Allergies  Social History:   reports that she quit smoking about 2 years ago. She does not have any smokeless tobacco history on file. She reports that she does not drink alcohol or use illicit drugs.  Family History: Family History  Problem Relation Age of Onset  . Diabetes Sister   . Hyperlipidemia Sister   .  Hypertension Sister      Physical Exam: Filed Vitals:   04/12/13 1844 04/12/13 1849 04/12/13 1930 04/12/13 2008  BP: 123/56 123/56 121/60 160/79  Pulse: 61 101  101  Temp:    98.2 F (36.8 C)  TempSrc:      Resp: 27 26 27 24   Height:    5\' 6"  (1.676 m)  Weight:    55.929 kg (123 lb 4.8 oz)  SpO2: 80% 96%  99%   Blood pressure 160/79, pulse 101, temperature 98.2 F (36.8 C), temperature source Oral, resp. rate 24, height 5\' 6"  (1.676 m), weight 55.929 kg (123 lb 4.8 oz), SpO2 99.00%. Body mass index is 19.91 kg/(m^2).   GEN:  Pleasant elderly Caucasian lady  lying bed; coughing and frequently; requiring frequent suctioning; cooperative with exam PSYCH:  Alert,oriented to place;  affect is appropriate. HEENT: Mucous membranes pink and anicteric; right facial weakness  CHEST WALL: No tenderness CHEST: Tachypneic; transmitted breath HEART: Regular rate and rhythm; no murmurs rubs or gallops ABDOMEN: , soft non-tender; no masses, no organomegaly, normal abdominal bowel sounds;  no intertriginous candida. Rectal Exam: Not done EXTREMITIES:  age-appropriate arthropathy of the hands and knees; no edema; no ulcerations. Genitalia: not examined PULSES: 2+ and symmetric SKIN: Normal hydration no rash or ulceration CNS: Right hemiplegia  Labs on Admission:  Basic Metabolic Panel:  Recent Labs Lab 04/10/13 1009 04/12/13 1732  NA 138 139  K 4.5 4.1  CL 102 99  CO2  --  27  GLUCOSE 154* 136*  BUN 6 14  CREATININE 0.60 0.64  CALCIUM  --  10.3   Liver Function Tests:  Recent Labs Lab 04/12/13 1732  AST 13  ALT 13  ALKPHOS 127*  BILITOT 0.3  PROT 8.2  ALBUMIN 4.1   No results found for this basename: LIPASE, AMYLASE,  in the last 168 hours No results found for this basename: AMMONIA,  in the last 168 hours CBC:  Recent Labs Lab 04/10/13 1009 04/12/13 1732  WBC  --  13.4*  NEUTROABS  --  8.7*  HGB 13.3 12.8  HCT 39.0 37.9  MCV  --  89.4  PLT  --  312   Cardiac Enzymes:  Recent Labs Lab 04/12/13 1732  TROPONINI <0.30   BNP: No components found with this basename: POCBNP,  D-dimer: No components found with this basename: D-DIMER,  CBG:  Recent Labs Lab 04/08/13 0525 04/09/13 0507 04/10/13 0526 04/10/13 0910 04/10/13 1137  GLUCAP 121* 112* 135* 164* 95    Radiological Exams on Admission: Dg Chest 1 View  04/11/2013   CLINICAL DATA:  Cough.  EXAM: CHEST - 1 VIEW  COMPARISON:  03/22/2013  FINDINGS: The heart size is normal. Main pulmonary arteries are prominent suggesting pulmonary arterial hypertension. The  lungs are somewhat hyperinflated suggesting emphysema. Tiny area scarring at the left base laterally. There appears to be a slight compression deformity of T12, age indeterminate.  IMPRESSION: Probable COPD. Possible compression fracture of T12, age indeterminate.   Electronically Signed   By: Geanie Cooley   On: 04/11/2013 14:42   Mr Brain Wo Contrast  04/12/2013   CLINICAL DATA:  Worsening right-sided weakness, drooling, and dysphagia. Recent admission for left hemisphere stroke.  EXAM: MRI HEAD WITHOUT CONTRAST  TECHNIQUE: Multiplanar, multisequence MR imaging was performed. No intravenous contrast was administered.  COMPARISON:  MRI 03/22/2013. MRA 03/23/2013. Report of cerebral angiogram from 04/10/2013 indicates left internal carotid artery thrombosis.  FINDINGS: There has been progression of  the previously identified left hemisphere infarction. Multiple frontal, posterior frontal, basal ganglia, insular, parietal, and occipital lobe subcentimeter lesions with restricted diffusion affect the left hemisphere cortex and subcortical white matter in a linear fashion consistent with a watershed type event due to internal carotid artery occlusion.  There is no evidence for flow related enhancement in the left internal carotid artery skull base or cavernous segments. This represents a change from previous MRI and MRA and is consistent with thrombosis found on angiography.  A small focus of acute infarction affects the right occipital cortex, of unknown significance.  Motion degraded exam with atrophy and chronic microvascular ischemic change. Remote left basal ganglia hemorrhagic infarct re-demonstrated. No evidence for acute hemorrhage on today's examination.  Scalp, calvarium, orbits, sinuses, mastoids, and other visualized extracranial soft tissues grossly unremarkable.  IMPRESSION: Progression of the previously identified left hemisphere multifocal infarcts in a watershed distribution. The left internal carotid  artery which was previously stenotic but patent on prior MRI/ MRA from 03/22/2013 is now thrombosed. Subcentimeter right occipital infarct incidentally noted.  Baseline atrophy and chronic microvascular ischemic change without intracranial hemorrhage or mass-effect.   Electronically Signed   By: Davonna Belling M.D.   On: 04/12/2013 18:43   Dg Chest Portable 1 View  04/12/2013   CLINICAL DATA:  Extreme weakness, dysphagia, stroke, history asthma, diabetes, COPD, smoking, MI  EXAM: PORTABLE CHEST - 1 VIEW  COMPARISON:  Portable exam 1721 hr compared to 04/11/2013  FINDINGS: Normal heart size, mediastinal contours, and pulmonary vascularity.  Atherosclerotic calcification aorta.  Emphysematous and bronchitic changes consistent with COPD.  No definite infiltrate, pleural effusion, or pneumothorax.  Diffuse osseous demineralization.  IMPRESSION: COPD changes.  No acute abnormalities.   Electronically Signed   By: Ulyses Southward M.D.   On: 04/12/2013 17:37      Assessment/Plan   Active Problems:   Diabetes mellitus without complication   NICM (nonischemic cardiomyopathy)   COPD (chronic obstructive pulmonary disease)   Acute ischemic stroke   Dysphagia   possible Aspiration pneumonia   PLAN: We'll admit this lady and start hydration to support brain perfusion since she appears to be having an extension of her stroke. We'll presume an aspiration pneumonia start empiric antibiotics and get a CT scan of the chest Because of her inability to completely protect her airway we'll manage her in the intensive care  She'll need reevaluation by speech therapy Other plans as per orders.  Code Status: Full code Family Communication:  Plans discuss with patient and family at bedside Disposition Plan:   Harvest Deist Nocturnist [facility] Pager [phone]   04/12/2013, 8:20 PM

## 2013-04-12 NOTE — Progress Notes (Signed)
ANTIBIOTIC CONSULT NOTE - INITIAL  Pharmacy Consult for vancomycin and Zosyn Indication: suspected pnumonia  No Known Allergies  Patient Measurements: Height: 5\' 6"  (167.6 cm) Weight: 123 lb 10.9 oz (56.1 kg) IBW/kg (Calculated) : 59.3 Adjusted Body Weight: actual weigh less than IBW  Vital Signs: Temp: 98.1 F (36.7 C) (10/01 2209) Temp src: Oral (10/01 2209) BP: 160/79 mmHg (10/01 2008) Pulse Rate: 101 (10/01 2008) Intake/Output from previous day:   Intake/Output from this shift:    Labs:  Recent Labs  04/10/13 1009 04/12/13 1732  WBC  --  13.4*  HGB 13.3 12.8  PLT  --  312  CREATININE 0.60 0.64   Estimated Creatinine Clearance: 58 ml/min (by C-G formula based on Cr of 0.64). No results found for this basename: VANCOTROUGH, Leodis Binet, VANCORANDOM, GENTTROUGH, GENTPEAK, GENTRANDOM, TOBRATROUGH, TOBRAPEAK, TOBRARND, AMIKACINPEAK, AMIKACINTROU, AMIKACIN,  in the last 72 hours   Microbiology: Recent Results (from the past 720 hour(s))  URINE CULTURE     Status: None   Collection Time    03/22/13  4:10 PM      Result Value Range Status   Specimen Description URINE, CATHETERIZED   Final   Special Requests NONE   Final   Culture  Setup Time     Final   Value: 03/22/2013 18:50     Performed at [facility] Count     Final   Value: >=100,000 COLONIES/ML     Performed at [facility]   Culture     Final   Value: KLEBSIELLA PNEUMONIAE     Performed at [facility]   Report Status 03/24/2013 FINAL   Final   Organism ID, Bacteria KLEBSIELLA PNEUMONIAE   Final    Medical History: Past Medical History  Diagnosis Date  . 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 Diabetes mellitus without complication   . Asthma   . HTN (hypertension)   . Tobacco abuse   . 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 COPD (chronic obstructive pulmonary disease)     Medications:  Scheduled:  . [START ON 04/13/2013] aspirin  300 mg Rectal Daily   Or  . [START ON 04/13/2013] aspirin  325 mg Oral Daily  . [START ON 04/13/2013] enoxaparin (LOVENOX) injection  40 mg Subcutaneous Q24H  . [START ON 04/13/2013] insulin aspart  0-9 Units Subcutaneous Q4H   Infusions:  . sodium chloride 1,000 mL (04/12/13 2236)   PRN: iohexol  Assessment: Acute CVA elderly female with propable aspiration pneumonia  Goal of Therapy:  Desire vancomycin regimen to give troughs 15-29mcg/ml.  With CrCl >20-67ml/min will utilize routine Zosyn regimen.  Plan:  1.  Vancomycin 1gm IV x 1 dose tonight.  Further maintenance regimen per clinical pharmacist in morning. 2.  Zosyn 3.375gm IV q8h (with 4hr infusions) to start tonight.  Pharmacist may adjust due to decreased body mass.  Shirley Muscat E 04/12/2013,11:05 PM

## 2013-04-12 NOTE — Progress Notes (Signed)
Dr Orvan Falconer gave orders to transfer to step down. Report called to Rockwell Germany RN. Transported on telemetry. Family with patient.

## 2013-04-12 NOTE — ED Notes (Signed)
Pt is a resident at [facility], has a HX of LT sided CVA, last known well date was last night per [facility] Nurse, Pt went to PT today and the staff noticed rt sided facial droop with drooling, and rt sided weakness, difficulty swallowing.

## 2013-04-12 NOTE — ED Notes (Signed)
Pt unable to swallow. Oropharyngeal airway suctioning provided.

## 2013-04-12 NOTE — ED Provider Notes (Signed)
CSN: [id number]     Arrival date & time 04/12/13  1607 History  This chart was scribed for Ward Givens, MD by Dorothey Baseman, ED Scribe. This patient was seen in room APA02/APA02 and the patient's care was started at 4:48 PM.    Chief Complaint  Patient presents with  . Extremity Weakness  . Dysphagia   The history is provided by the patient and a relative (sister). No language interpreter was used.   HPI Comments: Christine Durham is a 72 y.o. female with a history of CVA, most recent episode on 03/20/2013 who presents to the Emergency Department complaining of right-sided extremity weakness, drooling, and dysphagia onset this morning when her sister states that her rehabilitation therapists reported noticing the symptoms. Her sister reports that she was eating and drinking normally yesterday, but not today. Patient is to be from the [facility] in 2 days for the prior episode of CVA , and that she was discharged with an at-home therapist to assist with her care and rehabilitation. Her sister reports that the patient had an arteriogram done 2 days ago by Dr Edilia Bo and she was told there was nothing they could do.    PCP Dr Sherril Croon  Past Medical History  Diagnosis Date  . 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 Diabetes mellitus without complication   . Asthma   . HTN (hypertension)   . Tobacco abuse   . 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 COPD (chronic obstructive pulmonary disease)    Past Surgical History  Procedure Laterality Date  . Appendectomy     Family History  Problem Relation Age of Onset  . Diabetes Sister   . Hyperlipidemia Sister   . Hypertension Sister    History  Substance Use Topics  . Smoking status: Former Smoker    Quit date: 06/21/2010  . Smokeless tobacco: Not on file  . Alcohol Use: No  currently in rehab   OB History   Grav Para Term Preterm Abortions TAB SAB Ect Mult Living                 Review of Systems  A complete 10 system review of  systems was obtained and all systems are negative except as noted in the HPI and PMH.   Allergies  Review of patient's allergies indicates no known allergies.  Home Medications   Current Outpatient Rx  Name  Route  Sig  Dispense  Refill  . albuterol (PROVENTIL HFA;VENTOLIN HFA) 108 (90 BASE) MCG/ACT inhaler   Inhalation   Inhale 2 puffs into the lungs every 6 (six) hours as needed for wheezing.   1 Inhaler   2   . aspirin EC 81 MG tablet   Oral   Take 1 tablet (81 mg total) by mouth daily.         Marland Kitchen atorvastatin (LIPITOR) 10 MG tablet   Oral   Take 2 tablets (20 mg total) by mouth at bedtime.         . clopidogrel (PLAVIX) 75 MG tablet   Oral   Take 1 tablet (75 mg total) by mouth daily with breakfast.         . donepezil (ARICEPT) 5 MG tablet   Oral   Take 5 mg by mouth at bedtime.         Marland Kitchen lisinopril (PRINIVIL,ZESTRIL) 5 MG tablet   Oral   Take 5 mg by mouth daily.         Marland Kitchen  metFORMIN (GLUCOPHAGE) 500 MG tablet   Oral   Take 1 tablet (500 mg total) by mouth 2 (two) times daily with a meal.   60 tablet   1   . oxyCODONE-acetaminophen (PERCOCET/ROXICET) 5-325 MG per tablet   Oral   Take 2 tablets by mouth every 4 (four) hours as needed for pain.          Triage Vitals: BP 138/62  Pulse 101  Temp(Src) 97.9 F (36.6 C) (Oral)  Resp 18  SpO2 96%  Vital signs normal except tachycardia   Physical Exam  Nursing note and vitals reviewed. Constitutional: She is oriented to person, place, and time. She appears well-developed and well-nourished.  Non-toxic appearance. She does not appear ill. No distress.  Pt rarely speaks  HENT:  Head: Normocephalic and atraumatic.  Right Ear: External ear normal.  Left Ear: External ear normal.  Nose: Nose normal. No mucosal edema or rhinorrhea.  Mouth/Throat: Mucous membranes are normal. No dental abscesses or edematous.  Missing teeth  Eyes: Conjunctivae and EOM are normal. Pupils are equal, round, and  reactive to light.  Neck: Normal range of motion and full passive range of motion without pain. Neck supple.  Cardiovascular: Normal rate, regular rhythm and normal heart sounds.  Exam reveals no gallop and no friction rub.   No murmur heard. Pulmonary/Chest: Effort normal and breath sounds normal. No respiratory distress. She has no wheezes. She has no rhonchi. She has no rales. She exhibits no tenderness and no crepitus.  Abdominal: Soft. Normal appearance and bowel sounds are normal. She exhibits no distension. There is no tenderness. There is no rebound and no guarding.  Musculoskeletal: Normal range of motion. She exhibits no edema and no tenderness.  Moves all extremities well.   Neurological: She is alert and oriented to person, place, and time. She has normal strength. No cranial nerve deficit.  Eyebrows move equally. Difficulty following commands. Some weakness in elevating her left leg against gravity which is old. Mild weakness in her left upper extremity which is old. Slow to follow commands with right upper and lower extremities, but can lift both against gravity.   Skin: Skin is warm, dry and intact. No rash noted. No erythema. No pallor.  Psychiatric: She has a normal mood and affect. Her speech is normal and behavior is normal. Her mood appears not anxious.    ED Course  Procedures (including critical care time)     DIAGNOSTIC STUDIES: Oxygen Saturation is 96% on room air, normal by my interpretation.    COORDINATION OF CARE: 4:58PM- Will review her past imaging and will order a chest x-ray and an MRI of the brain. Discussed treatment plan with patient and her sister at bedside and the sister verbalized agreement on the patient's behalf.   18:35 Dr Benard Rink, radiology, called MR results, states she has extended her prior stroke in September, c/w watershed effect  19:07 Dr Orvan Falconer, admit to tele, team 2  7:10PM- Discussed that MRI results show that the CVA that occurred in  September has extended and that the patient will be admitted to the hospital to determine the cause of recurrent CVA. Advised patient to continue using anticoagulants. Discussed treatment plan with patient and her sister at bedside and the sister verbalized agreement on the patient's behalf.   FINDINGS: Carotid Arteriogram  1. There is no significant atherosclerotic disease of the aortic arch. The innominate artery, right subclavian artery, right vertebral artery, left subclavian artery, left vertebral arteries are all  patent.  2. On the left side, which is the symptomatic side, the common carotid artery and external carotid arteries are patent. 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  3. On the right side, the common carotid artery, external carotid artery, and internal carotid artery are patent. There is no stenosis of the right carotid bifurcation or internal carotid artery extracranially on the right.  [name], MD, FACS  [facility]  DATE OF DICTATION: 04/10/2013  Labs Review   Results for orders placed during the hospital encounter of 04/12/13  CBC WITH DIFFERENTIAL      Result Value Range   WBC 13.4 (*) 4.0 - 10.5 K/uL   RBC 4.24  3.87 - 5.11 MIL/uL   Hemoglobin 12.8  12.0 - 15.0 g/dL   HCT 16.1  09.6 - 04.5 %   MCV 89.4  78.0 - 100.0 fL   MCH 30.2  26.0 - 34.0 pg   MCHC 33.8  30.0 - 36.0 g/dL   RDW 40.9  81.1 - 91.4 %   Platelets 312  150 - 400 K/uL   Neutrophils Relative % 65  43 - 77 %   Lymphocytes Relative 24  12 - 46 %   Monocytes Relative 9  3 - 12 %   Eosinophils Relative 2  0 - 5 %   Basophils Relative 0  0 - 1 %   Neutro Abs 8.7 (*) 1.7 - 7.7 K/uL   Lymphs Abs 3.2  0.7 - 4.0 K/uL   Monocytes Absolute 1.2 (*) 0.1 - 1.0 K/uL   Eosinophils Absolute 0.3  0.0 - 0.7 K/uL   Basophils Absolute 0.0  0.0 - 0.1 K/uL   WBC Morphology ATYPICAL LYMPHOCYTES    COMPREHENSIVE METABOLIC PANEL       Result Value Range   Sodium 139  135 - 145 mEq/L   Potassium 4.1  3.5 - 5.1 mEq/L   Chloride 99  96 - 112 mEq/L   CO2 27  19 - 32 mEq/L   Glucose, Bld 136 (*) 70 - 99 mg/dL   BUN 14  6 - 23 mg/dL   Creatinine, Ser 7.82  0.50 - 1.10 mg/dL   Calcium 95.6  8.4 - 21.3 mg/dL   Total Protein 8.2  6.0 - 8.3 g/dL   Albumin 4.1  3.5 - 5.2 g/dL   AST 13  0 - 37 U/L   ALT 13  0 - 35 U/L   Alkaline Phosphatase 127 (*) 39 - 117 U/L   Total Bilirubin 0.3  0.3 - 1.2 mg/dL   GFR calc non Af Amer 88 (*) >90 mL/min   GFR calc Af Amer >90  >90 mL/min  APTT      Result Value Range   aPTT 34  24 - 37 seconds  PROTIME-INR      Result Value Range   Prothrombin Time 13.7  11.6 - 15.2 seconds   INR 1.07  0.00 - 1.49  TROPONIN I      Result Value Range   Troponin I <0.30  <0.30 ng/mL   Laboratory interpretation all normal except leukocytosis    Imaging Review Dg Chest 1 View  04/11/2013   CLINICAL DATA:  Cough.  EXAM: CHEST - 1 VIEW  COMPARISON:  03/22/2013  FINDINGS: The heart size is normal. Main pulmonary arteries are prominent suggesting pulmonary arterial hypertension. The lungs are somewhat hyperinflated suggesting emphysema. Tiny area scarring at the left base  laterally. There appears to be a slight compression deformity of T12, age indeterminate.  IMPRESSION: Probable COPD. Possible compression fracture of T12, age indeterminate.   Electronically Signed   By: Geanie Cooley   On: 04/11/2013 14:42   Mr Brain Wo Contrast  04/12/2013   CLINICAL DATA:  Worsening right-sided weakness, drooling, and dysphagia. Recent admission for left hemisphere stroke.  EXAM: MRI HEAD WITHOUT CONTRAST  TECHNIQUE: Multiplanar, multisequence MR imaging was performed. No intravenous contrast was administered.  COMPARISON:  MRI 03/22/2013. MRA 03/23/2013. Report of cerebral angiogram from 04/10/2013 indicates left internal carotid artery thrombosis.  FINDINGS: There has been progression of the previously identified left  hemisphere infarction. Multiple frontal, posterior frontal, basal ganglia, insular, parietal, and occipital lobe subcentimeter lesions with restricted diffusion affect the left hemisphere cortex and subcortical white matter in a linear fashion consistent with a watershed type event due to internal carotid artery occlusion.  There is no evidence for flow related enhancement in the left internal carotid artery skull base or cavernous segments. This represents a change from previous MRI and MRA and is consistent with thrombosis found on angiography.  A small focus of acute infarction affects the right occipital cortex, of unknown significance.  Motion degraded exam with atrophy and chronic microvascular ischemic change. Remote left basal ganglia hemorrhagic infarct re-demonstrated. No evidence for acute hemorrhage on today's examination.  Scalp, calvarium, orbits, sinuses, mastoids, and other visualized extracranial soft tissues grossly unremarkable.  IMPRESSION: Progression of the previously identified left hemisphere multifocal infarcts in a watershed distribution. The left internal carotid artery which was previously stenotic but patent on prior MRI/ MRA from 03/22/2013 is now thrombosed. Subcentimeter right occipital infarct incidentally noted.  Baseline atrophy and chronic microvascular ischemic change without intracranial hemorrhage or mass-effect.   Electronically Signed   By: Davonna Belling M.D.   On: 04/12/2013 18:43   Dg Chest Portable 1 View  04/12/2013   CLINICAL DATA:  Extreme weakness, dysphagia, stroke, history asthma, diabetes, COPD, smoking, MI  EXAM: PORTABLE CHEST - 1 VIEW  COMPARISON:  Portable exam 1721 hr compared to 04/11/2013  FINDINGS: Normal heart size, mediastinal contours, and pulmonary vascularity.  Atherosclerotic calcification aorta.  Emphysematous and bronchitic changes consistent with COPD.  No definite infiltrate, pleural effusion, or pneumothorax.  Diffuse osseous demineralization.   IMPRESSION: COPD changes.  No acute abnormalities.   Electronically Signed   By: Ulyses Southward M.D.   On: 04/12/2013 17:37     Date: 04/12/2013  Rate: 103  Rhythm: sinus tachycardia  QRS Axis: left  Intervals: normal  ST/T Wave abnormalities: normal  Conduction Disutrbances:none  Narrative Interpretation: Q waves in inf leads, PRWP  Old EKG Reviewed: unchanged from 03/22/2013    MDM   1. Acute ischemic stroke      CRITICAL CARE Performed by: Devoria Albe L Total critical care time: 31 min Critical care time was exclusive of separately billable procedures and treating other patients. Critical care was necessary to treat or prevent imminent or life-threatening deterioration. Critical care was time spent personally by me on the following activities: development of treatment plan with patient and/or surrogate as well as nursing, discussions with consultants, evaluation of patient's response to treatment, examination of patient, obtaining history from patient or surrogate, ordering and performing treatments and interventions, ordering and review of laboratory studies, ordering and review of radiographic studies, pulse oximetry and re-evaluation of patient's condition.   I personally performed the services described in this documentation, which was scribed in my presence. The  recorded information has been reviewed and considered.  Devoria Albe, MD, Armando Gang    Ward Givens, MD 04/12/13 [phone]

## 2013-04-12 NOTE — ED Notes (Signed)
Pt swallow screen not completed due to pt's inability to clear secretions and swallow

## 2013-04-13 ENCOUNTER — Encounter (HOSPITAL_COMMUNITY): Payer: Medicare Other

## 2013-04-13 ENCOUNTER — Inpatient Hospital Stay (HOSPITAL_COMMUNITY): Payer: Medicare Other

## 2013-04-13 ENCOUNTER — Encounter (HOSPITAL_COMMUNITY): Payer: Self-pay | Admitting: Radiology

## 2013-04-13 DIAGNOSIS — J690 Pneumonitis due to inhalation of food and vomit: Secondary | ICD-10-CM | POA: Diagnosis present

## 2013-04-13 LAB — GLUCOSE, CAPILLARY
Glucose-Capillary: 103 mg/dL — ABNORMAL HIGH (ref 70–99)
Glucose-Capillary: 123 mg/dL — ABNORMAL HIGH (ref 70–99)
Glucose-Capillary: 125 mg/dL — ABNORMAL HIGH (ref 70–99)
Glucose-Capillary: 128 mg/dL — ABNORMAL HIGH (ref 70–99)
Glucose-Capillary: 131 mg/dL — ABNORMAL HIGH (ref 70–99)
Glucose-Capillary: 162 mg/dL — ABNORMAL HIGH (ref 70–99)

## 2013-04-13 MED ORDER — LISINOPRIL 5 MG PO TABS
5.0000 mg | ORAL_TABLET | Freq: Every day | ORAL | Status: DC
  Administered 2013-04-13 – 2013-04-15 (×3): 5 mg via ORAL
  Filled 2013-04-13 (×4): qty 1

## 2013-04-13 MED ORDER — CHLORHEXIDINE GLUCONATE CLOTH 2 % EX PADS
6.0000 | MEDICATED_PAD | Freq: Every day | CUTANEOUS | Status: AC
  Administered 2013-04-13 – 2013-04-17 (×5): 6 via TOPICAL

## 2013-04-13 MED ORDER — BIOTENE DRY MOUTH MT LIQD
15.0000 mL | Freq: Two times a day (BID) | OROMUCOSAL | Status: DC
  Administered 2013-04-13 – 2013-04-15 (×5): 15 mL via OROMUCOSAL

## 2013-04-13 MED ORDER — MUPIROCIN 2 % EX OINT
1.0000 "application " | TOPICAL_OINTMENT | Freq: Two times a day (BID) | CUTANEOUS | Status: AC
  Administered 2013-04-13 – 2013-04-17 (×9): 1 via NASAL
  Filled 2013-04-13: qty 22
  Filled 2013-04-13: qty 44
  Filled 2013-04-13: qty 22

## 2013-04-13 MED ORDER — VANCOMYCIN HCL IN DEXTROSE 1-5 GM/200ML-% IV SOLN
1000.0000 mg | INTRAVENOUS | Status: DC
  Administered 2013-04-13: 1000 mg via INTRAVENOUS
  Filled 2013-04-13: qty 200

## 2013-04-13 MED ORDER — HYDRALAZINE HCL 20 MG/ML IJ SOLN
10.0000 mg | Freq: Four times a day (QID) | INTRAMUSCULAR | Status: DC | PRN
  Administered 2013-04-17: 10 mg via INTRAVENOUS
  Filled 2013-04-13: qty 1

## 2013-04-13 MED ORDER — CHLORHEXIDINE GLUCONATE 0.12 % MT SOLN
15.0000 mL | Freq: Two times a day (BID) | OROMUCOSAL | Status: DC
  Administered 2013-04-14 – 2013-04-15 (×4): 15 mL via OROMUCOSAL
  Filled 2013-04-13 (×4): qty 15

## 2013-04-13 MED ORDER — ALBUTEROL SULFATE (5 MG/ML) 0.5% IN NEBU: 2.5000 mg | INHALATION_SOLUTION | Freq: Four times a day (QID) | RESPIRATORY_TRACT | Status: DC | PRN

## 2013-04-13 NOTE — Evaluation (Signed)
Physical Therapy Evaluation Patient Details Name: Christine Durham MRN: [id number] DOB: 07/03/1941 Today's Date: 04/13/2013 Time: 0920-1014 PT Time Calculation (min): 54 min  PT Assessment / Plan / Recommendation History of Present Illness  Pt had been at the [facility] for rehab from a recent stroke causing right hemiparesis.  She had another stroke in 2011 which caused left hemiparesis.  She also sustained a fall about 4 weeks ago and sustained an L1 compression fracture.  She was admitted yesterday with a progression of her recent stroke caused by occlusion of the left carotid artery...this has caused infarct in the left brain, watershed distribution.  She is also found to have an acite infarct in the right occipital cortex and may possibly have aspiration pneumonia.  Clinical Impression   Pt was seen for evaluation.  She was alert and cooperative, unable to speak more than a word or two (but fully comprehends).  She was found to have increased weakness in both LEs as compared to evaluation at last admission.  She needed mod assist to assume sitting, but had good sitting balance.  Mod assistance was needed to transfer bed to chair.  Her BP was monitored closely during tx and remained WNL at all times.  She will need to continue SNF at d/c.    PT Assessment  Patient needs continued PT services    Follow Up Recommendations  SNF    Does the patient have the potential to tolerate intense rehabilitation    no  Barriers to Discharge        Equipment Recommendations  None recommended by PT    Recommendations for Other Services     Frequency Min 5X/week    Precautions / Restrictions Precautions Precautions: Fall Restrictions Weight Bearing Restrictions: No   Pertinent Vitals/Pain       Mobility  Bed Mobility Bed Mobility: Rolling Right;Rolling Left Rolling Right: 5: Supervision;With rail Rolling Left: 5: Supervision;With rail Supine to Sit: 3: Mod assist;HOB  elevated Transfers Transfers: Stand Pivot Transfers Stand Pivot Transfers: 3: Mod assist    Exercises General Exercises - Lower Extremity Ankle Circles/Pumps: AAROM;Both;10 reps;Supine Short Arc Quad: AAROM;Both;10 reps;Supine Heel Slides: AAROM;Both;10 reps;Supine Hip ABduction/ADduction: AAROM;Both;10 reps;Supine   PT Diagnosis: Difficulty walking;Hemiplegia non-dominant side;Hemiplegia dominant side  PT Problem List: Decreased strength;Decreased activity tolerance;Decreased mobility PT Treatment Interventions: Gait training;Functional mobility training;Therapeutic exercise     PT Goals(Current goals can be found in the care plan section) Acute Rehab PT Goals Patient Stated Goal: none stated PT Goal Formulation: With patient/family Time For Goal Achievement: 04/27/13 Potential to Achieve Goals: Fair  Visit Information  Last PT Received On: 04/13/13 History of Present Illness: Pt had been at the [facility] for rehab from a recent stroke causing right hemiparesis.  She had another stroke in 2011 which caused left hemiparesis.  She also sustained a fall about 4 weeks ago and sustained an L1 compression fracture.  She was admitted yesterday with a progression of her recent stroke caused by occlusion of the left carotid artery...this has caused infarct in the left brain, watershed distribution.  She is also found to have an acite infarct in the right occipital cortex and may possibly have aspiration pneumonia.       Prior Functioning  Home Living Family/patient expects to be discharged to:: Skilled nursing facility Prior Function Level of Independence: Needs assistance Gait / Transfers Assistance Needed: needed assistance to ambulate and transfer.Marland KitchenMarland KitchenI assume she had been using a walker ADL's / Homemaking Assistance Needed: she needed assist  with all ADLs (except feeding) Communication Communication: Expressive difficulties Dominant Hand: Right    Cognition  Cognition Arousal/Alertness:  Awake/alert Behavior During Therapy: WFL for tasks assessed/performed Overall Cognitive Status: Within Functional Limits for tasks assessed    Extremity/Trunk Assessment Upper Extremity Assessment Upper Extremity Assessment: Defer to OT evaluation Lower Extremity Assessment Lower Extremity Assessment: RLE deficits/detail;LLE deficits/detail RLE Deficits / Details: strength generally 3-/5 in all major muscle groups LLE Deficits / Details: strength is 3/5 at the hip, 2/5 at the knee and ankle ...she wears an AFO due to long term weakness in the anterior tibialis   Balance Balance Balance Assessed: Yes Static Sitting Balance Static Sitting - Balance Support: No upper extremity supported;Feet supported Static Sitting - Level of Assistance: 7: Independent Dynamic Sitting Balance Dynamic Sitting - Balance Support: No upper extremity supported;Feet supported Dynamic Sitting - Level of Assistance: 5: Stand by assistance  End of Session PT - End of Session Equipment Utilized During Treatment: Gait belt Activity Tolerance: Patient tolerated treatment well Patient left: in chair;with call bell/phone within reach Nurse Communication: Mobility status  GP     Konrad Penta 04/13/2013, 11:16 AM

## 2013-04-13 NOTE — Clinical Social Work Psychosocial (Signed)
    Clinical Social Work Department BRIEF PSYCHOSOCIAL ASSESSMENT 04/13/2013  Patient:  Christine Durham, Christine Durham     Account Number:  [id number]     Admit date:  04/12/2013  Clinical Social Worker:  Santa Genera, CLINICAL SOCIAL WORKER  Date/Time:  04/13/2013 10:00 AM  Referred by:  Physician  Date Referred:  04/13/2013 Referred for  SNF Placement   Other Referral:   Interview type:  Family Other interview type:    PSYCHOSOCIAL DATA Living Status:  FACILITY Admitted from facility:  [facility] Level of care:   Primary support name:  Charlaine Rayo-Webb Primary support relationship to patient:  SIBLING Degree of support available:   Family very supportive and involved.    CURRENT CONCERNS Current Concerns  Post-Acute Placement   Other Concerns:    SOCIAL WORK ASSESSMENT / PLAN CSW met w patient in room, per record patient is oriented x4 and has expressive aphasia.  Is unable to express herself verbally, but has been able to communicate her wishes to medical staff re procedures and interventions. Niece in room provided information, states that patient has been at [facility] for rehab post stroke since approx Sept 2014.  Was due to discharge home from SNF this week, but current medical situation alter that plan. Family agreeable for patient to return to SNF for further rehab.    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  Lived in [place] in family home until two years ago when she had a stroke and was unable to live on her own after rehab.  Sister in [place] brought her to live in her home and has cared for her for past two years w support of other relatives.  Patient has son who lives in [place], son is supportive of sister's care for patient but has not been significantly involved.  Patient was one of 3 sisters, one died in [place] at time patient was admitted to [facility].  Caregiver in [place] is only sibling still alive.  Patient  does not have husband.    Prior to stroke in September, patient was able to care for herself at sister's home, walked w cane.  Family has noted that patient has had strokes when under stress -for example, stroke in early September came after visit to former home in [place] which is in process of being sold. Patient became very emotional when faced w task of selling family homeplace.    Niece states that patient's sister would like to become healthcare POA as she is the most involved w patient's care at this time.  CSW explained need for patient to be competent to make this designation and explained that POA could make decisions only when patient could not speak for herself.    [facility] confirmed that patient was their patient.  CSW will continue to work w family and patient on discharge planning process and needs.   Assessment/plan status:  Psychosocial Support/Ongoing Assessment of Needs Other assessment/ plan:   Information/referral to community resources:   Adv Directives information    PATIENT'S/FAMILY'S RESPONSE TO PLAN OF CARE: Family wants information on Adv Dir, willing to work w CSW on discharge planning.  CSW will involve patient as much as possible.     Santa Genera, LCSW Clinical Social Worker [phone])

## 2013-04-13 NOTE — Progress Notes (Signed)
ANTIBIOTIC CONSULT NOTE - INITIAL  Pharmacy Consult for Vancomycin & Zosyn Indication: pneumonia  No Known Allergies  Patient Measurements: Height: 5\' 6"  (167.6 cm) Weight: 123 lb 10.9 oz (56.1 kg) IBW/kg (Calculated) : 59.3  Vital Signs: Temp: 98.1 F (36.7 C) (10/02 0400) Temp src: Axillary (10/02 0400) BP: 106/53 mmHg (10/02 0620) Pulse Rate: 75 (10/02 0620) Intake/Output from previous day: 10/01 0701 - 10/02 0700 In: 805 [I.V.:555; IV Piggyback:250] Out: -  Intake/Output from this shift:    Labs:  Recent Labs  04/10/13 1009 04/12/13 1732  WBC  --  13.4*  HGB 13.3 12.8  PLT  --  312  CREATININE 0.60 0.64   Estimated Creatinine Clearance: 58 ml/min (by C-G formula based on Cr of 0.64). No results found for this basename: VANCOTROUGH, Leodis Binet, VANCORANDOM, GENTTROUGH, GENTPEAK, GENTRANDOM, TOBRATROUGH, TOBRAPEAK, TOBRARND, AMIKACINPEAK, AMIKACINTROU, AMIKACIN,  in the last 72 hours   Microbiology: Recent Results (from the past 720 hour(s))  URINE CULTURE     Status: None   Collection Time    03/22/13  4:10 PM      Result Value Range Status   Specimen Description URINE, CATHETERIZED   Final   Special Requests NONE   Final   Culture  Setup Time     Final   Value: 03/22/2013 18:50     Performed at [facility] Count     Final   Value: >=100,000 COLONIES/ML     Performed at [facility]   Culture     Final   Value: KLEBSIELLA PNEUMONIAE     Performed at [facility]   Report Status 03/24/2013 FINAL   Final   Organism ID, Bacteria KLEBSIELLA PNEUMONIAE   Final  MRSA PCR SCREENING     Status: Abnormal   Collection Time    04/12/13 10:15 PM      Result Value Range Status   MRSA by PCR POSITIVE (*) NEGATIVE Final   Comment: RESULT CALLED TO, READ BACK BY AND VERIFIED WITH:     BREWER D. AT 0025 ON [id number] BY THOMPSON S.                The GeneXpert MRSA Assay (FDA     approved for NASAL specimens     only), is one  component of a     comprehensive MRSA colonization     surveillance program. It is not     intended to diagnose MRSA     infection nor to guide or     monitor treatment for     MRSA infections.    Medical History: Past Medical History  Diagnosis Date  . 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 Diabetes mellitus without complication   . Asthma   . HTN (hypertension)   . Tobacco abuse   . 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 COPD (chronic obstructive pulmonary disease)     Medications:  Scheduled:  . aspirin  300 mg Rectal Daily   Or  . aspirin  325 mg Oral Daily  . Chlorhexidine Gluconate Cloth  6 each Topical Q0600  . enoxaparin (LOVENOX) injection  40 mg Subcutaneous Q24H  . insulin aspart  0-9 Units Subcutaneous Q4H  . mupirocin ointment  1 application Nasal BID  . piperacillin-tazobactam (ZOSYN)  IV  3.375 g Intravenous Q8H   Assessment: 72 yo F with possible aspiration PNA.  She has history of CVA on  03/22/13 with subsequent dysphagia.  Over last week she has had increasing SOB, worsening cough, & inability to clear secretions.   She is afebrile with elevated WBC.   Renal function is at her baseline.   Vancomycin 10/2>> Zosyn 10/2>> Avelox 10/1 (as outpatient)  Goal of Therapy:  Vancomycin trough level 15-20 mcg/ml  Plan:  Zosyn 3.375gm IV Q8h to be infused over 4hrs Vancomycin 1gm IV q24h Check Vancomycin trough at steady state Monitor renal function and cx data   Elson Clan 04/13/2013,8:50 AM

## 2013-04-13 NOTE — Progress Notes (Signed)
UR chart review completed.  

## 2013-04-13 NOTE — Progress Notes (Signed)
04/13/13 1404 Patient's family requested update from MD regarding plan of care. Notified Dr. York Spaniel, MD spoke with patient's family for update. Speech evaluation to be obtained this afternoon per speech therapy. NPO at this time. Earnstine Regal ,RN

## 2013-04-13 NOTE — Progress Notes (Addendum)
PICC was ordered by Dr. Orvan Falconer. PICC procedure was explained to pt including the risks and benefits. Pt verbally states several times, "I do not want this done." Pt was able to verbally confirm her name, location, year, and president. I talked with pt's nurse, Alto Denver, RN and she also verbally confirmed with pt that she did not want a PICC. Pam reported that she would contact the pt's doctor to notify them.

## 2013-04-13 NOTE — Clinical Social Work Note (Signed)
CSW spoke w [facility] admissions, patient is close to having 21 days at facility, will have $152/day copay beginning at day 21.  Family has been encouraged to apply for Medicaid on patient's behalf.  Per [facility], patient was participating in PT, was able to ambulate 200 feet w rolling walker, required min assist w upper and lower ADLs, able to transfer w mis assist.  Had nectar thick/pureed diet.  Was able to express herself (joked w PT, communicated w staff), had swallowing problems.  Family will need to pay bed hold if they want to guarantee return to facility.  CSW will encourage family to speak w [facility] admissions to discuss options.  Santa Genera, LCSW Clinical Social Worker [phone])

## 2013-04-13 NOTE — Progress Notes (Signed)
[facility] PROGRESS NOTE  Christine Durham ZOX:[id number] DOB: 1940-08-08 DOA: 04/12/2013 PCP: Ignatius Specking., MD  72 y.o. female. [name], with some dementia, and past history of stroke, admitted to [facility] on September 10 with acute left hemispheric stroke, manifesting as dysphagia dysarthria and right hemiplegia. MRI/MRA showed stenosis of the carotid arteries. Plavix was added to her regimen and she was discharged to skilled nursing facility on a pured diet for rehabilitation. 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  She was started on Avelox on 9/30 There is no clear history of fever nor chills however patient's dementia makes her history unreliable  Assessment/Plan:  1. COPD exacerbation; possible aspiration pneumonia  -started on vanc+zosyn on admission (10/1); bronchodilators; aspiration precautions;  -obtain SPL, CT chest for better evalution;   2. Progression of Acute CVA with dysphagia dysarthria and right hemiplegia. -MRI (10/1): Progression of the previously identified left hemisphere multifocal infarcts in a watershed distribution. The left internal carotid artery which was previously stenotic but patent on prior MRI/ MRA from 03/22/2013 is now thrombosed. -recent angiogram by vascular: internal carotid artery is occluded approximately 2 cm above the bifurcation (9/29) -cont statin, ASA+plavix   3. DM recent HA1C-8.0; home meds metformin;  -cont ISS   4. HTN cont ACE  Code Status: full  Family Communication: none at  The bedside  (indicate person spoken with, relationship, and if by phone, the number) Disposition Plan: snf    Consultants:  None   Procedures:  CT head    Antibiotics:  vanc+zosyn (10/1) (indicate start date, and stop date if known)  HPI/Subjective: Alert   Objective: Filed Vitals:   04/13/13 0730  BP:   Pulse:   Temp: 98.8 F (37.1 C)  Resp:     Intake/Output Summary (Last 24 hours) at 04/13/13 0937 Last data filed at 04/13/13 0600  Gross per 24 hour  Intake    805 ml  Output      0 ml  Net    805 ml   Filed Weights   04/12/13 2008 04/12/13 2209  Weight: 55.929 kg (123 lb 4.8 oz) 56.1 kg (123 lb 10.9 oz)    Exam:   General:  Alert,   Cardiovascular: s1,s2 rrr  Respiratory: CTA bl   Abdomen: soft, nt  nd   Musculoskeletal: no edema    Data Reviewed: Basic Metabolic Panel:  Recent Labs Lab 04/10/13 1009 04/12/13 1732  NA 138 139  K 4.5 4.1  CL 102 99  CO2  --  27  GLUCOSE 154* 136*  BUN 6 14  CREATININE 0.60 0.64  CALCIUM  --  10.3   Liver Function Tests:  Recent Labs Lab 04/12/13 1732  AST 13  ALT 13  ALKPHOS 127*  BILITOT 0.3  PROT 8.2  ALBUMIN 4.1   No results found for this basename: LIPASE, AMYLASE,  in the last 168 hours No results found for this basename: AMMONIA,  in the last 168 hours CBC:  Recent Labs Lab 04/10/13 1009 04/12/13 1732  WBC  --  13.4*  NEUTROABS  --  8.7*  HGB 13.3 12.8  HCT 39.0 37.9  MCV  --  89.4  PLT  --  312   Cardiac Enzymes:  Recent Labs Lab 04/12/13 1732  TROPONINI <0.30   BNP (last 3 results)  Recent Labs  06/26/12 2320  PROBNP 150.2*   CBG:  Recent Labs Lab 04/10/13 1137 04/12/13 2025 04/13/13 0002 04/13/13 0421 04/13/13 0751  GLUCAP 95 121* 128* 125* 131*    Recent Results (from the past 240 hour(s))  MRSA PCR SCREENING     Status: Abnormal   Collection Time    04/12/13 10:15 PM      Result Value Range Status   MRSA by PCR POSITIVE (*) NEGATIVE Final   Comment: RESULT CALLED TO, READ BACK BY AND VERIFIED WITH:     BREWER D. AT 0025 ON [id number] BY THOMPSON S.                The GeneXpert MRSA Assay (FDA      approved for NASAL specimens     only), is one component of a     comprehensive MRSA colonization     surveillance program. It is not     intended to diagnose MRSA     infection nor to guide or     monitor treatment for     MRSA infections.     Studies: Dg Chest 1 View  04/11/2013   CLINICAL DATA:  Cough.  EXAM: CHEST - 1 VIEW  COMPARISON:  03/22/2013  FINDINGS: The heart size is normal. Main pulmonary arteries are prominent suggesting pulmonary arterial hypertension. The lungs are somewhat hyperinflated suggesting emphysema. Tiny area scarring at the left base laterally. There appears to be a slight compression deformity of T12, age indeterminate.  IMPRESSION: Probable COPD. Possible compression fracture of T12, age indeterminate.   Electronically Signed   By: Geanie Cooley   On: 04/11/2013 14:42   Mr Brain Wo Contrast  04/12/2013   CLINICAL DATA:  Worsening right-sided weakness, drooling, and dysphagia. Recent admission for left hemisphere stroke.  EXAM: MRI HEAD WITHOUT CONTRAST  TECHNIQUE: Multiplanar, multisequence MR imaging was performed. No intravenous contrast was administered.  COMPARISON:  MRI 03/22/2013. MRA 03/23/2013. Report of cerebral angiogram from 04/10/2013 indicates left internal carotid artery thrombosis.  FINDINGS: There has been progression of the previously identified left hemisphere infarction. Multiple frontal, posterior frontal, basal ganglia, insular, parietal, and occipital lobe subcentimeter lesions with restricted diffusion affect the left hemisphere cortex and subcortical white matter in a linear fashion consistent with a watershed type event due to internal carotid artery occlusion.  There is no evidence for flow related enhancement in the left internal carotid artery skull base or cavernous segments. This represents a change from previous MRI and MRA and is consistent with thrombosis found on angiography.  A small focus of acute infarction affects the right occipital  cortex, of unknown significance.  Motion degraded exam with atrophy and chronic microvascular ischemic change. Remote left basal ganglia hemorrhagic infarct re-demonstrated. No evidence for acute hemorrhage on today's examination.  Scalp, calvarium, orbits, sinuses, mastoids, and other visualized extracranial soft tissues grossly unremarkable.  IMPRESSION: Progression of the previously identified left hemisphere multifocal infarcts in a watershed distribution. The left internal carotid artery which was previously stenotic but patent on prior MRI/ MRA from 03/22/2013 is now thrombosed. Subcentimeter right occipital infarct incidentally noted.  Baseline atrophy and chronic microvascular ischemic change without intracranial hemorrhage or mass-effect.   Electronically Signed   By: Davonna Belling M.D.   On: 04/12/2013 18:43   Dg Chest Portable 1 View  04/12/2013   CLINICAL DATA:  Extreme weakness, dysphagia, stroke, history  asthma, diabetes, COPD, smoking, MI  EXAM: PORTABLE CHEST - 1 VIEW  COMPARISON:  Portable exam 1721 hr compared to 04/11/2013  FINDINGS: Normal heart size, mediastinal contours, and pulmonary vascularity.  Atherosclerotic calcification aorta.  Emphysematous and bronchitic changes consistent with COPD.  No definite infiltrate, pleural effusion, or pneumothorax.  Diffuse osseous demineralization.  IMPRESSION: COPD changes.  No acute abnormalities.   Electronically Signed   By: Ulyses Southward M.D.   On: 04/12/2013 17:37    Scheduled Meds: . aspirin  300 mg Rectal Daily   Or  . aspirin  325 mg Oral Daily  . Chlorhexidine Gluconate Cloth  6 each Topical Q0600  . enoxaparin (LOVENOX) injection  40 mg Subcutaneous Q24H  . insulin aspart  0-9 Units Subcutaneous Q4H  . mupirocin ointment  1 application Nasal BID  . piperacillin-tazobactam (ZOSYN)  IV  3.375 g Intravenous Q8H  . vancomycin  1,000 mg Intravenous Q24H   Continuous Infusions: . sodium chloride 75 mL/hr at 04/13/13 0600    Active  Problems:   Diabetes mellitus without complication   NICM (nonischemic cardiomyopathy)   Hypotension   COPD (chronic obstructive pulmonary disease)   Acute ischemic stroke   Dysphagia   possible Aspiration pneumonia    Time spent: > 35 minutes     Esperanza Sheets  [facility] Pager [phone]. If 7PM-7AM, please contact night-coverage at [email or web address], password [facility] 04/13/2013, 9:37 AM  LOS: 1 day

## 2013-04-13 NOTE — Progress Notes (Signed)
04/13/13 1526 Speech therapy to re-evaluate patient in the morning per Havery Moros with speech therapy. Pt drowsy and unable to complete evaluation safely this afternoon. States patient may have medications crushed if necessary, otherwise NPO. Text paged MD to notify. Earnstine Regal, RN

## 2013-04-13 NOTE — Plan of Care (Signed)
Problem: Consults Goal: Ischemic Stroke Patient Education See Patient Education Module for education specifics. Outcome: Progressing Stroke onset time not witnessed.  Incomprehensible speech, however patient remains oriented. Goal: Skin Care Protocol Initiated - if Braden Score 18 or less If consults are not indicated, leave blank or document N/A Outcome: Progressing Some bruising to R upper arm  Problem: tPA Day Progression Outcomes-Only if tPA administered Goal: Inclusion criteria for tPA STANDARD: < 3hours from symptoms onset: - Diagnosis of ischemic stroke causing measureable neurological deficit - Neurological signs shold not be minor & isolated - Symptoms of stroke should not be sugestive of SAH - No head trauma or prior stroke in previous 3 months - No gastrointestinal or urinary tract hemorrhage in previous 21 days - No arterial puncture at a noncompressible site in previous 7 days - BP not elevated [systolic <185 mmHg, diastolic < 110 mmHg] - Not taking oral anticoagulant or if being taken INR < or equal 1.7 - Platelet count > or equal 100,000 mm3 - No seizure w/postictal residual neurological impairments - Pt/family understand potential risks/benefits from treatment - Caution in pts with NIHSS > 22 - Seizure at stroke onset eligible for tPA if residual impairments due to stroke and not the seizures. - Neurological signs should not be clearing spontaneously - Exercise caution in treating pt with major deficits - Symptoms onset < 3hrs before beginning treatment - No MI in previous 3 months - No major surgery in previous 14 days - No history previous intracranial hemorrhage - No evidence active bleeding/acute trauma (fracture) on examinations - If receiving heparin in previous 48 hrs, aPTT must be normal range - Abnormal Blood Glucose < 50 or > 400 mg/dL - CT does not show multilobar infarction [hypodensity >1/3 cerebral hemisphere] EXTENDED: 3-4.5 hrous from symptom  onset - Age > 80 years - History of prior stroke and diabetes - Any anticoagulant use prior to admission (even if INR < 1.7) - NIHSS > 25" Outcome: Not Applicable Date Met:  04/13/13 Not witnessed onset  Problem: Acute Treatment Outcomes Goal: Neuro exam at baseline or improved Outcome: Progressing Speech impaired, unable to swallow.  Oriented to surroundings  Goal: Airway maintained/protected Outcome: Progressing Remains on room air  Goal: Prognosis discussed with family/patient as appropriate Outcome: Progressing They understand the diagnosis of thrombosis of carotid artery handouts given  Problem: Progression Outcomes Goal: Communication method established Outcome: Completed/Met Date Met:  04/13/13 Using call bell for assistance,

## 2013-04-13 NOTE — Evaluation (Signed)
Clinical/Bedside Swallow Evaluation  Patient Details  Name: SENA HOOPINGARNER MRN: [id number] Date of Birth: [birth year]  Today's Date: 04/13/2013 Time: 1500-1520 SLP Time Calculation (min): 20 min  Past Medical History:  Past Medical History  Diagnosis Date  . 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 Diabetes mellitus without complication   . Asthma   . HTN (hypertension)   . Tobacco abuse   . 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 COPD (chronic obstructive pulmonary disease)    Past Surgical History:  Past Surgical History  Procedure Laterality Date  . Appendectomy     HPI:  72 y.o. female. [name], with some dementia, and past history of stroke, admitted to [facility] on September 10 with acute left hemispheric stroke, manifesting as dysphagia dysarthria and right hemiplegia. MRI/MRA showed stenosis of the carotid arteries. Plavix was added to her regimen and she was discharged to skilled nursing facility on a pured diet for rehabilitation. 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     Assessment / Plan / Recommendation Clinical Impression  Pt is known to this SLP from recent admission. MBSS completed 03/24/2013 with recommendation for puree and nectar-thick liquids. Pt now with an extension of stroke and increased impairments. Pt is alert and attempting to follow directions and agreeable to po trials, however vocal quality wet with audible secretions. Swallow trigger is delayed and multiple swallows elicited for single bites. Recommend continue NPO except ok for po meds crushed in puree as able and SLP to re-eval Friday. Above to RN, Morrie Sheldon.    Aspiration Risk  Moderate    Diet Recommendation  NPO except meds   Medication Administration: Crushed with puree    Other  Recommendations Oral Care Recommendations: Oral care QID   Follow Up Recommendations  Skilled Nursing facility    Frequency and Duration min 3x week  1 week       SLP Swallow Goals Patient will consume recommended diet without observed clinical signs of aspiration with: Moderate assistance Patient will utilize recommended strategies during swallow to increase swallowing safety with: Moderate assistance   Swallow Study Prior Functional Status       General Date of Onset: 04/12/13 HPI: 72 y.o. female. [name], with some dementia, and past history of stroke, admitted to [facility] on September 10 with acute left hemispheric stroke, manifesting as dysphagia dysarthria and right hemiplegia. MRI/MRA showed stenosis of the carotid arteries. Plavix was added to her regimen and she was discharged to skilled nursing facility on a pured diet for rehabilitation. 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   Type of Study: Bedside swallow evaluation Previous Swallow Assessment: MBSS 03/24/13 Diet Prior to this Study: NPO (puree and nectars PTA) Temperature Spikes Noted: No Respiratory Status: Room air History of Recent Intubation: No Behavior/Cognition: Alert;Cooperative;Requires cueing Self-Feeding Abilities: Needs assist Patient Positioning: Upright in bed Baseline Vocal Quality: Low vocal intensity;Hoarse Volitional Cough: Weak Volitional Swallow:  Unable to elicit    Oral/Motor/Sensory Function Overall Oral Motor/Sensory Function: Impaired Labial ROM: Within Functional Limits Labial Symmetry: Within Functional Limits Labial Strength:  Reduced Labial Sensation: Within Functional Limits Lingual ROM: Reduced right Lingual Symmetry: Within Functional Limits Lingual Strength: Reduced Facial ROM: Within Functional Limits Facial Symmetry: Within Functional Limits Mandible: Within Functional Limits   Ice Chips Ice chips: Impaired Presentation: Spoon Pharyngeal Phase Impairments: Cough - Immediate   Thin Liquid Thin Liquid: Not tested    Nectar Thick     Honey Thick Honey Thick Liquid: Not tested   Puree Puree: Impaired Presentation: Spoon Oral Phase Impairments: Reduced lingual movement/coordination Oral Phase Functional Implications: Prolonged oral transit;Oral residue Pharyngeal Phase Impairments: Suspected delayed Swallow;Wet Vocal Quality   Solid   GO    Solid: Not tested       Cady Hafen 04/13/2013,6:08 PM

## 2013-04-14 ENCOUNTER — Ambulatory Visit (HOSPITAL_COMMUNITY): Payer: Medicare Other

## 2013-04-14 DIAGNOSIS — J449 Chronic obstructive pulmonary disease, unspecified: Secondary | ICD-10-CM

## 2013-04-14 LAB — GLUCOSE, CAPILLARY
Glucose-Capillary: 75 mg/dL (ref 70–99)
Glucose-Capillary: 114 mg/dL — ABNORMAL HIGH (ref 70–99)
Glucose-Capillary: 138 mg/dL — ABNORMAL HIGH (ref 70–99)

## 2013-04-14 LAB — BASIC METABOLIC PANEL
BUN: 9 mg/dL (ref 6–23)
CO2: 27 mEq/L (ref 19–32)
Calcium: 10.3 mg/dL (ref 8.4–10.5)
Chloride: 99 mEq/L (ref 96–112)
GFR calc Af Amer: >90 mL/min (ref >90)
Glucose, Bld: 109 mg/dL — ABNORMAL HIGH (ref 70–99)
Potassium: 4.3 mEq/L (ref 3.5–5.1)

## 2013-04-14 LAB — CBC
HCT: 36.5 % (ref 36.0–46.0)
Hemoglobin: 12.3 g/dL (ref 12.0–15.0)
MCHC: 33.7 g/dL (ref 30.0–36.0)
MCV: 88.6 fL (ref 78.0–100.0)
RBC: 4.12 MIL/uL (ref 3.87–5.11)
RDW: 12.7 % (ref 11.5–15.5)
WBC: 16.2 10*3/uL — ABNORMAL HIGH (ref 4.0–10.5)

## 2013-04-14 NOTE — Progress Notes (Signed)
Christine Durham ZOX:[id number] DOB: 06-17-1941 DOA: 04/12/2013 PCP: Ignatius Specking., MD   Subjective: This lady who appears to have extended a CVA, unfortunately in a watershed area, says that she wants to go home. Swallowing is an issue and swallowing evaluation was being done as I entered to see the patient today so we will await a report.           Physical Exam: Blood pressure 142/81, pulse 89, temperature 98.6 F (37 C), temperature source Oral, resp. rate 18, height 5\' 6"  (1.676 m), weight 56.1 kg (123 lb 10.9 oz), SpO2 97.00%. She is frail, thin. She has nonfluent dysphasia. Surprisingly, she has good strength in all 4 limbs. She has some facial asymmetry but not huge. Heart sounds are present without murmurs. Lung fields are surprisingly clear with just occasional wheezing. There are no crackles or bronchial breathing. She appears very alert.   Investigations:  Recent Results (from the past 240 hour(s))  MRSA PCR SCREENING     Status: Abnormal   Collection Time    04/12/13 10:15 PM      Result Value Range Status   MRSA by PCR POSITIVE (*) NEGATIVE Final   Comment: RESULT CALLED TO, READ BACK BY AND VERIFIED WITH:     BREWER D. AT 0025 ON [id number] BY THOMPSON S.                The GeneXpert MRSA Assay (FDA     approved for NASAL specimens     only), is one component of a     comprehensive MRSA colonization     surveillance program. It is not     intended to diagnose MRSA     infection nor to guide or     monitor treatment for     MRSA infections.     Basic Metabolic Panel:  Recent Labs  78/29/56 1732 04/14/13 0634  NA 139 139  K 4.1 4.3  CL 99 99  CO2 27 27  GLUCOSE 136* 109*  BUN 14 9  CREATININE 0.64 0.76  CALCIUM 10.3 10.3   Liver Function Tests:  Recent Labs  04/12/13 1732  AST 13  ALT 13  ALKPHOS 127*  BILITOT 0.3  PROT 8.2  ALBUMIN 4.1     CBC:  Recent Labs  04/12/13 1732 04/14/13 0634  WBC 13.4* 16.2*  NEUTROABS 8.7*  --   HGB  12.8 12.3  HCT 37.9 36.5  MCV 89.4 88.6  PLT 312 288    Ct Chest Wo Contrast  04/13/2013   CLINICAL DATA:  Shortness of breath.  EXAM: CT CHEST WITHOUT CONTRAST  TECHNIQUE: Multidetector CT imaging of the chest was performed following the standard protocol without IV contrast.  COMPARISON:  Chest radiograph 04/12/2013  FINDINGS: There is no pleural effusion identified. No pleural effusion identified. There is mild changes of centrilobular emphysema noted. No airspace consolidation or atelectasis identified. Subpleural nodule in the right middle lobe measures 3 mm, image 38/ series 3. There is a nodule at the right base which measures 5 mm, image 43/series 3.  The trachea appears patent and is midline. The heart size is normal. No pericardial effusion. There is a lower right paratracheal lymph node measuring 8 mm, image 23/series 2. Left paratracheal lymph node measures 8 mm, image 22/ series 2. The esophagus appears within normal limits. There is no axillary or supraclavicular lymph nodes.  Limited imaging through the upper abdomen shows several calcified granulomas within the liver. Review of the  visualized bony structures is significant for mild spondylosis. No aggressive lytic or sclerotic bone lesions. T12 compression fracture is identified with loss of 50% of the vertebral body height. This is age indeterminate.  IMPRESSION: 1. No acute cardiopulmonary abnormalities.  2. Subpleural nodule in the right middle lobe and parenchymal nodule in the right base are both nonspecific. If the patient is at high risk for bronchogenic carcinoma, follow-up chest CT at 6-12 months is recommended. If the patient is at low risk for bronchogenic carcinoma, follow-up chest CT at 12 months is recommended. This recommendation follows the consensus statement: Guidelines for Management of Small Pulmonary Nodules Detected on CT Scans: A Statement from the Fleischner Society as published in Radiology 2005;237:395-400.  3. Age  indeterminate T12 compression fracture with loss of 40% of the vertebral body height.   Electronically Signed   By: Signa Kell M.D.   On: 04/13/2013 14:03   Mr Brain Wo Contrast  04/12/2013   CLINICAL DATA:  Worsening right-sided weakness, drooling, and dysphagia. Recent admission for left hemisphere stroke.  EXAM: MRI HEAD WITHOUT CONTRAST  TECHNIQUE: Multiplanar, multisequence MR imaging was performed. No intravenous contrast was administered.  COMPARISON:  MRI 03/22/2013. MRA 03/23/2013. Report of cerebral angiogram from 04/10/2013 indicates left internal carotid artery thrombosis.  FINDINGS: There has been progression of the previously identified left hemisphere infarction. Multiple frontal, posterior frontal, basal ganglia, insular, parietal, and occipital lobe subcentimeter lesions with restricted diffusion affect the left hemisphere cortex and subcortical white matter in a linear fashion consistent with a watershed type event due to internal carotid artery occlusion.  There is no evidence for flow related enhancement in the left internal carotid artery skull base or cavernous segments. This represents a change from previous MRI and MRA and is consistent with thrombosis found on angiography.  A small focus of acute infarction affects the right occipital cortex, of unknown significance.  Motion degraded exam with atrophy and chronic microvascular ischemic change. Remote left basal ganglia hemorrhagic infarct re-demonstrated. No evidence for acute hemorrhage on today's examination.  Scalp, calvarium, orbits, sinuses, mastoids, and other visualized extracranial soft tissues grossly unremarkable.  IMPRESSION: Progression of the previously identified left hemisphere multifocal infarcts in a watershed distribution. The left internal carotid artery which was previously stenotic but patent on prior MRI/ MRA from 03/22/2013 is now thrombosed. Subcentimeter right occipital infarct incidentally noted.  Baseline  atrophy and chronic microvascular ischemic change without intracranial hemorrhage or mass-effect.   Electronically Signed   By: Davonna Belling M.D.   On: 04/12/2013 18:43   Dg Chest Portable 1 View  04/12/2013   CLINICAL DATA:  Extreme weakness, dysphagia, stroke, history asthma, diabetes, COPD, smoking, MI  EXAM: PORTABLE CHEST - 1 VIEW  COMPARISON:  Portable exam 1721 hr compared to 04/11/2013  FINDINGS: Normal heart size, mediastinal contours, and pulmonary vascularity.  Atherosclerotic calcification aorta.  Emphysematous and bronchitic changes consistent with COPD.  No definite infiltrate, pleural effusion, or pneumothorax.  Diffuse osseous demineralization.  IMPRESSION: COPD changes.  No acute abnormalities.   Electronically Signed   By: Ulyses Southward M.D.   On: 04/12/2013 17:37      Medications: I have reviewed the patient's current medications.  Impression: 1. 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 She is on aspirin and Plavix. 2. COPD, stable. No evidence of aspiration pneumonia on CT chest scan. 3. Type 2 diabetes mellitus. 4. Hypertension.  Plan: 1. Await swallowing evaluation to see if she can tolerate any kind of diet. 2. Ask  CIR to evaluate patient to see if they would accept her for further rehabilitation. If she does not qualify, then she will have to go home soon.  Consultants:  None.   Procedures:  None.   Antibiotics:  Antibiotics discontinued today as there is no aspiration pneumonia.                   Code Status: Full code.  Family Communication: Discussed plan with patient at the bedside.   Disposition Plan: Depending on progress.  Time spent: 20 minutes.   LOS: 2 days   Wilson Singer Pager [phone]  04/14/2013, 10:34 AM

## 2013-04-14 NOTE — Progress Notes (Signed)
Speech Language Pathology Dysphagia Treatment Patient Details Name: Christine Durham MRN: [id number] DOB: 01/06/1941 Today's Date: 04/14/2013 Time:  -     Assessment / Plan / Recommendation Clinical Impression       Diet Recommendation       SLP Plan     Pertinent Vitals/Pain    Swallowing Goals   To reassess for initiation of po intake   General   Pt admitted to [facility] with extension of CVA    Originally possible aspiration pneumonia but xray showed no signs and ABT stopped   Pt alert and responsive  Does exhibit rec/exp aphasia which will need to be further assessed  Prior diet puree and NTL from MBS performed on 03/24/13   Oral Cavity - Oral Hygiene   Own teeth   Missing dentition   Dysphagia Treatment Upon arrival in room pt with congested cough, lungs are clear per MD with slight wheeze    Strong cough and able to clear spontaneously and on command reassesed with puree and NTL Puree via spoon with good closure, no spillage, slight increased oral transit time, swallow initiation avg 2 seconds  No immediate cough or throat clear  NTL via cup in small sips with no spillage, no residue, swallow initiation 1-2 seconds no immediate cough Trial of HTL with cough question increased thickness increased residue Cough episodes do not increase with po intake, appear same as at rest No change in breathing patterns but pt does appear to fatigue quickly   Spoke with Dr Christine Durham  Recommend puree NTL diet with strict aspiration precautions posted in room   GO   Christine Durham 04/14/2013, 11:15 AM

## 2013-04-14 NOTE — Progress Notes (Signed)
Notified Dr. Nobie Putnam via text that the patient did not have a PICC line.  She refused to have it placed.

## 2013-04-14 NOTE — Clinical Social Work Note (Signed)
Handout on applying for SNF Medicaid left in patient's room for family to access.  Spoke w sister Ramond Craver, family considering taking patient home at discharge but may consider completing Medicaid application on patient behalf.   Santa Genera, LCSW Clinical Social Worker [phone])

## 2013-04-14 NOTE — Progress Notes (Signed)
Rehab admissions - I was asked to evaluate for possible acute inpatient rehab admission.  Patient has been at [facility] for almost 3 weeks.  Now with extension of recent stroke.  I would like to see OT notes if possible.  Currently rehab beds are full.  I will follow progress over the weekend and then see if we have bed availability.  Call me for questions.  #[phone]

## 2013-04-14 NOTE — Evaluation (Signed)
Occupational Therapy Evaluation Patient Details Name: BRITNEY NEWSTROM MRN: [id number] DOB: 05-23-1941 Today's Date: 04/14/2013 Time: 4098-1191 OT Time Calculation (min): 12 min  OT Assessment / Plan / Recommendation     Clinical Impression   Patient is a 72 y/o female s/p CVA. Pt had been at the [facility] for rehab from a recent stroke causing right hemiparesis. She had another stroke in 2011 which caused left hemiparesis. She also sustained a fall about 4 weeks ago and sustained an L1 compression fracture. She was admitted yesterday with a progression of her recent stroke caused by occlusion of the left carotid artery...this has caused infarct in the left brain, watershed distribution. She is also found to have an acite infarct in the right occipital cortex and may possibly have aspiration pneumonia.  Recommend CIR at discharge to decreased the burden of care for family. If CIR is unavailable; recommend SNF.     OT Assessment  Patient needs continued OT Services    Follow Up Recommendations  CIR;SNF    Barriers to Discharge   Patient has good family support.  Equipment Recommendations  None recommended by OT    Recommendations for Other Services Rehab consult  Frequency  Min 2X/week    Precautions / Restrictions Precautions Precautions: Fall Restrictions Weight Bearing Restrictions: No   Pertinent Vitals/Pain No complaints.     ADL  Lower Body Dressing: Performed;+1 Total assistance Where Assessed - Lower Body Dressing: Supported sitting Toilet Transfer: Performed;Moderate assistance Toilet Transfer Method: Stand pivot Toilet Transfer Equipment:  (to bed from recliner) Transfers/Ambulation Related to ADLs: Patient transfers at Mod Assit level without assistive device.     OT Diagnosis: Generalized weakness (Hemiparesis of non-dominant side)  OT Problem List: Decreased strength;Impaired balance (sitting and/or standing);Decreased safety awareness;Impaired UE functional  use;Decreased cognition OT Treatment Interventions: Self-care/ADL training;Therapeutic exercise;Neuromuscular education;DME and/or AE instruction;Patient/family education;Visual/perceptual remediation/compensation;Cognitive remediation/compensation;Therapeutic activities;Balance training   OT Goals(Current goals can be found in the care plan section)    Visit Information  Last OT Received On: 04/14/13 Assistance Needed: +1       Prior Functioning     Home Living Family/patient expects to be discharged to:: Skilled nursing facility Living Arrangements: Other relatives Prior Function Level of Independence: Needs assistance Gait / Transfers Assistance Needed: needed assistance to ambulate and transfer.Marland KitchenMarland KitchenI assume she had been using a walker ADL's / Homemaking Assistance Needed: she needed assist with all ADLs (except feeding) Communication Communication: Expressive difficulties Dominant Hand: Right         Vision/Perception Vision - History Baseline Vision: No visual deficits Patient Visual Report: No change from baseline Vision - Assessment Vision Assessment: Vision not tested Perception Perception: Not tested Praxis Praxis: Impaired Praxis-Other Comments: limb-kinetic apraxia   Cognition  Cognition Arousal/Alertness: Awake/alert Behavior During Therapy: Restless Overall Cognitive Status: Difficult to assess Difficult to assess due to: Impaired communication    Extremity/Trunk Assessment Upper Extremity Assessment Upper Extremity Assessment: RUE deficits/detail;LUE deficits/detail RUE Deficits / Details: A/ROM WNL in all ranges. MMT: 4-/5 in all ranges. Functional grasp although unable to sustain grasp longer than 5 seconds during eval. Ataxic movement during functional activity. RUE Coordination: decreased gross motor;decreased fine motor LUE Deficits / Details: A/ROM WFL in all ranges. MMT: 3+/5 in all ranges. Decreased gross grasp. Unable to sustain grasp longer  than 5 seconds during eval. Ataxic movement during functional activity. LUE Coordination: decreased fine motor;decreased gross motor Lower Extremity Assessment Lower Extremity Assessment: Defer to PT evaluation     Mobility Bed Mobility Sit  to Supine: 6: Modified independent (Device/Increase time);With rail;HOB elevated Transfers Sit to Stand: 3: Mod assist;Without upper extremity assist;From chair/3-in-1 Stand to Sit: 3: Mod assist;Without upper extremity assist;To bed Details for Transfer Assistance: Cues for hand placement. Patient with a heavy posterior lean once standing.           End of Session OT - End of Session Activity Tolerance: Patient tolerated treatment well Patient left: in bed;with call bell/phone within reach;with bed alarm set    Limmie Patricia, OTR/L,CBIS   04/14/2013, 2:41 PM

## 2013-04-14 NOTE — Progress Notes (Signed)
Physical Therapy Treatment Patient Details Name: Christine Durham MRN: [id number] DOB: 19-Aug-1940 Today's Date: 04/14/2013 Time: 4098-1191 PT Time Calculation (min): 45 min  PT Assessment / Plan / Recommendation  History of Present Illness     PT Comments   Pt is alert and cooperative today.  She has no c/o.  There was an increase in RLE strength to 3+/5 today, but no apparent change in the LLE strength.  Her standing balance is found to be decreased and she needs min assist while standing to keep from falling backward.  She has significant difficulty with gait now due to decreased balance and ambulating 2' with a walker was quite strenuous.  She would be appropriate for CIR if pt/family are agreeable.  Follow Up Recommendations  CIR;SNF     Does the patient have the potential to tolerate intense rehabilitation   yes  Barriers to Discharge        Equipment Recommendations  None recommended by PT    Recommendations for Other Services Rehab consult  Frequency Min 6X/week   Progress towards PT Goals Progress towards PT goals: Progressing toward goals  Plan Discharge plan needs to be updated    Precautions / Restrictions Precautions Precautions: Fall Restrictions Weight Bearing Restrictions: No   Pertinent Vitals/Pain     Mobility  Bed Mobility Supine to Sit: 4: Min assist;HOB flat Sit to Supine: Not Tested (comment) Transfers Sit to Stand: With upper extremity assist;From bed;3: Mod assist Stand to Sit: 4: Min assist;To chair/3-in-1;With upper extremity assist Details for Transfer Assistance: cues for proper hand placement---once pt is standing, she immediately falls backward due to decreased standing balance Ambulation/Gait Ambulation/Gait Assistance: 4: Min assist Ambulation Distance (Feet): 2 Feet Assistive device: Rolling walker Gait Pattern: Decreased step length - left;Decreased hip/knee flexion - left;Decreased trunk rotation Gait velocity: very slow and  labored Modified Rankin (Stroke Patients Only) Pre-Morbid Rankin Score: Moderate disability Modified Rankin: Severe disability    Exercises General Exercises - Lower Extremity Short Arc Quad: AROM;AAROM;Both;10 reps;Supine Heel Slides: AROM;AAROM;Both;10 reps;Supine Hip ABduction/ADduction: AROM;AAROM;Both;10 reps;Supine Other Exercises Other Exercises: bridging...5 repetitions   PT Diagnosis:    PT Problem List:   PT Treatment Interventions:     PT Goals (current goals can now be found in the care plan section)    Visit Information  Last PT Received On: 04/14/13    Subjective Data      Cognition  Cognition Arousal/Alertness: Awake/alert Behavior During Therapy: [facility] for tasks assessed/performed Overall Cognitive Status: Within Functional Limits for tasks assessed    Balance  Static Standing Balance Static Standing - Balance Support: Bilateral upper extremity supported Static Standing - Level of Assistance: 4: Min assist (falls backward) Timed Up and Go Test TUG: Cognitive TUG  End of Session PT - End of Session Equipment Utilized During Treatment: Gait belt Activity Tolerance: Patient tolerated treatment well Patient left: in chair;with call bell/phone within reach;with chair alarm set Nurse Communication: Mobility status   GP     [name] 04/14/2013, 11:59 AM

## 2013-04-14 NOTE — Care Management Note (Addendum)
    Page 1 of 2   04/18/2013     1:14:44 PM   CARE MANAGEMENT NOTE 04/18/2013  Patient:  Christine Durham, Christine Durham   Account Number:  [id number]  Date Initiated:  04/14/2013  Documentation initiated by:  Rosemary Holms  Subjective/Objective Assessment:   Pt is from [facility] on admission. Per CSW, her SNF days are near their end and family is not able to afford copay. CSW suggested CIR since pt has a great family and disposition would not be an issue. Per CSW, [facility] states she was making     Action/Plan:   great progress prior to this most recent CVA. Dr. Deirdre Priest evaluated pt and agrees this would be a benefit. Jeanie at [facility] states she does not have a bed currently but may next week. She will evalute pt.   Anticipated DC Date:  04/15/2013   Anticipated DC Plan:  HOME W HOME HEALTH SERVICES  In-house referral  Clinical Social Worker      DC Planning Services  CM consult      Choice offered to / List presented to:     DME arranged  WALKER - Lavone Nian      DME agency  [facility]     [facility] arranged  HH-1 RN  HH-10 DISEASE MANAGEMENT  HH-2 PT  HH-3 OT  HH-4 NURSE'S AIDE  HH-5 SPEECH THERAPY  HH-6 SOCIAL WORKER      HH agency  [facility]   Status of service:  Completed, signed off Medicare Important Message given?  YES (If response is "NO", the following Medicare IM given date fields will be blank) Date Medicare IM given:  04/18/2013 Date Additional Medicare IM given:    Discharge Disposition:  HOME W HOME HEALTH SERVICES  Per UR Regulation:    If discussed at Long Length of Stay Meetings, dates discussed:   04/18/2013    Comments:  04/18/13 Rosemary Holms RN BSN CM [facility] will provide [facility] services and deliver wheelchair to daughter's home ([place]) [address]  04/14/13 Rosemary Holms RN BNS CM Met with pt, daughter and g'daughter. HH set up with [facility]. Rolling walker will be delivered to the hospital tomorrow for DC.

## 2013-04-15 ENCOUNTER — Inpatient Hospital Stay (HOSPITAL_COMMUNITY): Payer: Medicare Other

## 2013-04-15 DIAGNOSIS — R5381 Other malaise: Secondary | ICD-10-CM

## 2013-04-15 DIAGNOSIS — R1310 Dysphagia, unspecified: Secondary | ICD-10-CM

## 2013-04-15 LAB — GLUCOSE, CAPILLARY
Glucose-Capillary: 129 mg/dL — ABNORMAL HIGH (ref 70–99)
Glucose-Capillary: 134 mg/dL — ABNORMAL HIGH (ref 70–99)
Glucose-Capillary: 165 mg/dL — ABNORMAL HIGH (ref 70–99)

## 2013-04-15 LAB — BASIC METABOLIC PANEL
CO2: 23 mEq/L (ref 19–32)
Chloride: 103 mEq/L (ref 96–112)
Creatinine, Ser: 0.89 mg/dL (ref 0.50–1.10)
GFR calc non Af Amer: 64 mL/min — ABNORMAL LOW (ref >90)
Glucose, Bld: 151 mg/dL — ABNORMAL HIGH (ref 70–99)
Potassium: 3.4 mEq/L — ABNORMAL LOW (ref 3.5–5.1)
Sodium: 140 mEq/L (ref 135–145)

## 2013-04-15 LAB — CBC
HCT: 33.2 % — ABNORMAL LOW (ref 36.0–46.0)
Hemoglobin: 11.2 g/dL — ABNORMAL LOW (ref 12.0–15.0)
RBC: 3.76 MIL/uL — ABNORMAL LOW (ref 3.87–5.11)
WBC: 9.1 10*3/uL (ref 4.0–10.5)

## 2013-04-15 MED ORDER — POTASSIUM CHLORIDE CRYS ER 20 MEQ PO TBCR
40.0000 meq | EXTENDED_RELEASE_TABLET | Freq: Once | ORAL | Status: AC
  Administered 2013-04-15: 40 meq via ORAL
  Filled 2013-04-15: qty 2

## 2013-04-15 NOTE — Progress Notes (Signed)
Physical Therapy Treatment Patient Details Name: Christine Durham MRN: [id number] DOB: 11-Jul-1941 Today's Date: 04/15/2013 Time: 7829-5621 PT Time Calculation (min): 35 min  PT Assessment / Plan / Recommendation  History of Present Illness Pt had been at the [facility] for rehab from a recent stroke causing right hemiparesis.  She had another stroke in 2011 which caused left hemiparesis.  She also sustained a fall about 4 weeks ago and sustained an L1 compression fracture.  She was admitted yesterday with a progression of her recent stroke caused by occlusion of the left carotid artery...this has caused infarct in the left brain, watershed distribution.  She is also found to have an acite infarct in the right occipital cortex and may possibly have aspiration pneumonia.   PT Comments   Pt mm strength and tolerance to gait is improving  Follow Up Recommendations  CIR     Does the patient have the potential to tolerate intense rehabilitation   yes  Barriers to Discharge  none      Equipment Recommendations  None recommended by PT    Recommendations for Other Services  none(SP and OT already seeing)  Frequency   6x  Progress towards PT Goals  yes  Plan Current plan remains appropriate    Precautions / Restrictions Precautions Precautions: Fall Restrictions Weight Bearing Restrictions: No   Pertinent Vitals/Pain 0/10    Mobility  Bed Mobility Rolling Right: 6: Modified independent (Device/Increase time);With rail Rolling Left: 6: Modified independent (Device/Increase time);With rail Supine to Sit: 4: Min assist;HOB flat Sit to Supine: Not Tested (comment) Transfers Sit to Stand: 4: Min assist Stand to Sit: 4: Min assist Ambulation/Gait Ambulation/Gait Assistance: 4: Min Environmental consultant (Feet): 6 Feet Assistive device: Rolling walker Gait Pattern: Decreased step length - right;Decreased step length - left Gait velocity: slow and not labored    Exercises General  Exercises - Lower Extremity Ankle Circles/Pumps: Both;10 reps Gluteal Sets: 10 reps (bridging) Long Arc Quad: Both;10 reps Heel Slides: Both;10 reps Hip ABduction/ADduction: Both;10 reps;Sidelying Straight Leg Raises: Both;10 reps   PT Diagnosis:    PT Problem List:   PT Treatment Interventions:     PT Goals (current goals can now be found in the care plan section)    Visit Information  Last PT Received On: 04/15/13 History of Present Illness: Pt had been at the [facility] for rehab from a recent stroke causing right hemiparesis.  She had another stroke in 2011 which caused left hemiparesis.  She also sustained a fall about 4 weeks ago and sustained an L1 compression fracture.  She was admitted yesterday with a progression of her recent stroke caused by occlusion of the left carotid artery...this has caused infarct in the left brain, watershed distribution.  She is also found to have an acite infarct in the right occipital cortex and may possibly have aspiration pneumonia.    Subjective Data   Pt is difficult to understand but states she is not tired after therapy today.     Cognition    normal   Balance   fair -  End of Session PT - End of Session Equipment Utilized During Treatment: Gait belt Activity Tolerance: Patient tolerated treatment well Patient left: in chair;with call bell/phone within reach;with chair alarm set   GP     Thurl Boen,CINDY 04/15/2013, 11:32 AM

## 2013-04-15 NOTE — Progress Notes (Signed)
Christine Durham ZOX:[id number] DOB: 01/04/1941 DOA: 04/12/2013 PCP: Ignatius Specking., MD   Subjective: This lady who appears to have extended a CVA, unfortunately in a watershed area, says that she wants to go home. Swallowing is an issue and although she was placed on a dysphagia 1 diet yesterday, she seems to be choking on this.           Physical Exam: Blood pressure 140/51, pulse 96, temperature 97.2 F (36.2 C), temperature source Oral, resp. rate 20, height 5\' 6"  (1.676 m), weight 56.1 kg (123 lb 10.9 oz), SpO2 96.00%. She is frail, thin. She has nonfluent dysphasia. Surprisingly, she has good strength in all 4 limbs. She has some facial asymmetry but not huge. Heart sounds are present without murmurs. Lung fields are surprisingly clear today. There are no crackles or bronchial breathing. She appears very alert.   Investigations:  Recent Results (from the past 240 hour(s))  MRSA PCR SCREENING     Status: Abnormal   Collection Time    04/12/13 10:15 PM      Result Value Range Status   MRSA by PCR POSITIVE (*) NEGATIVE Final   Comment: RESULT CALLED TO, READ BACK BY AND VERIFIED WITH:     BREWER D. AT 0025 ON [id number] BY THOMPSON S.                The GeneXpert MRSA Assay (FDA     approved for NASAL specimens     only), is one component of a     comprehensive MRSA colonization     surveillance program. It is not     intended to diagnose MRSA     infection nor to guide or     monitor treatment for     MRSA infections.     Basic Metabolic Panel:  Recent Labs  78/29/56 0634 04/15/13 0447  NA 139 140  K 4.3 3.4*  CL 99 103  CO2 27 23  GLUCOSE 109* 151*  BUN 9 12  CREATININE 0.76 0.89  CALCIUM 10.3 9.4   Liver Function Tests:  Recent Labs  04/12/13 1732  AST 13  ALT 13  ALKPHOS 127*  BILITOT 0.3  PROT 8.2  ALBUMIN 4.1     CBC:  Recent Labs  04/12/13 1732 04/14/13 0634 04/15/13 0447  WBC 13.4* 16.2* 9.1  NEUTROABS 8.7*  --   --   HGB 12.8 12.3  11.2*  HCT 37.9 36.5 33.2*  MCV 89.4 88.6 88.3  PLT 312 288 249    Ct Chest Wo Contrast  04/13/2013   CLINICAL DATA:  Shortness of breath.  EXAM: CT CHEST WITHOUT CONTRAST  TECHNIQUE: Multidetector CT imaging of the chest was performed following the standard protocol without IV contrast.  COMPARISON:  Chest radiograph 04/12/2013  FINDINGS: There is no pleural effusion identified. No pleural effusion identified. There is mild changes of centrilobular emphysema noted. No airspace consolidation or atelectasis identified. Subpleural nodule in the right middle lobe measures 3 mm, image 38/ series 3. There is a nodule at the right base which measures 5 mm, image 43/series 3.  The trachea appears patent and is midline. The heart size is normal. No pericardial effusion. There is a lower right paratracheal lymph node measuring 8 mm, image 23/series 2. Left paratracheal lymph node measures 8 mm, image 22/ series 2. The esophagus appears within normal limits. There is no axillary or supraclavicular lymph nodes.  Limited imaging through the upper abdomen shows several calcified granulomas within  the liver. Review of the visualized bony structures is significant for mild spondylosis. No aggressive lytic or sclerotic bone lesions. T12 compression fracture is identified with loss of 50% of the vertebral body height. This is age indeterminate.  IMPRESSION: 1. No acute cardiopulmonary abnormalities.  2. Subpleural nodule in the right middle lobe and parenchymal nodule in the right base are both nonspecific. If the patient is at high risk for bronchogenic carcinoma, follow-up chest CT at 6-12 months is recommended. If the patient is at low risk for bronchogenic carcinoma, follow-up chest CT at 12 months is recommended. This recommendation follows the consensus statement: Guidelines for Management of Small Pulmonary Nodules Detected on CT Scans: A Statement from the Fleischner Society as published in Radiology 2005;237:395-400.   3. Age indeterminate T12 compression fracture with loss of 40% of the vertebral body height.   Electronically Signed   By: Signa Kell M.D.   On: 04/13/2013 14:03      Medications: I have reviewed the patient's current medications.  Impression: 1. 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 She is on aspirin and Plavix. 2. COPD, stable. No evidence of aspiration pneumonia on CT chest scan. 3. Type 2 diabetes mellitus. 4. Hypertension. 5. Hypokalemia. 6. Dysphagia.     Plan: 1. Replete potassium. 2. Repeat chest x-ray today. 3. I have altered her dysphagia 1 diet to have pudding thick consistency. 4. Await further evaluation after the weekend by [facility] to see if they can still take her.  Consultants:  None.   Procedures:  None.   Antibiotics:  Antibiotics discontinued 04/14/2013.                  Code Status: Full code.  Family Communication: Discussed plan with patient at the bedside.   Disposition Plan: Depending on progress.  Time spent: 20 minutes.   LOS: 3 days   Wilson Singer Pager [phone]  04/15/2013, 9:40 AM

## 2013-04-16 LAB — GLUCOSE, CAPILLARY
Glucose-Capillary: 118 mg/dL — ABNORMAL HIGH (ref 70–99)
Glucose-Capillary: 201 mg/dL — ABNORMAL HIGH (ref 70–99)
Glucose-Capillary: 286 mg/dL — ABNORMAL HIGH (ref 70–99)

## 2013-04-16 LAB — BASIC METABOLIC PANEL
BUN: 9 mg/dL (ref 6–23)
Calcium: 9.8 mg/dL (ref 8.4–10.5)
Chloride: 110 mEq/L (ref 96–112)
Creatinine, Ser: 0.77 mg/dL (ref 0.50–1.10)
GFR calc non Af Amer: 83 mL/min — ABNORMAL LOW (ref >90)
Glucose, Bld: 182 mg/dL — ABNORMAL HIGH (ref 70–99)
Sodium: 146 mEq/L — ABNORMAL HIGH (ref 135–145)

## 2013-04-16 MED ORDER — BIOTENE DRY MOUTH MT LIQD
15.0000 mL | Freq: Two times a day (BID) | OROMUCOSAL | Status: DC
  Administered 2013-04-16 – 2013-04-18 (×5): 15 mL via OROMUCOSAL

## 2013-04-16 MED ORDER — AMLODIPINE BESYLATE 5 MG PO TABS
5.0000 mg | ORAL_TABLET | Freq: Every day | ORAL | Status: DC
  Administered 2013-04-16 – 2013-04-18 (×3): 5 mg via ORAL
  Filled 2013-04-16 (×3): qty 1

## 2013-04-16 NOTE — Progress Notes (Signed)
Christine Durham ZOX:[id number] DOB: [birth year] DOA: 04/12/2013 PCP: Ignatius Specking., MD   Subjective: This lady who appears to have extended a CVA, unfortunately in a watershed area, says that she wants to go home. Swallowing is better since she has been on a honey thick dysphagia 1 diet since yesterday. She has made very clear to me that she does not want to go to [facility], but wants to go home. She wants to go home tomorrow when everything can be set up at home.           Physical Exam: Blood pressure 157/66, pulse 78, temperature 98.2 F (36.8 C), temperature source Oral, resp. rate 18, height 5\' 6"  (1.676 m), weight 56.1 kg (123 lb 10.9 oz), SpO2 95.00%. She is frail, thin. She has nonfluent dysphasia. Surprisingly, she has good strength in all 4 limbs. She has some facial asymmetry but not huge. Heart sounds are present without murmurs. Lung fields are surprisingly clear today. There are no crackles or bronchial breathing. She appears very alert.   Investigations:  Recent Results (from the past 240 hour(s))  MRSA PCR SCREENING     Status: Abnormal   Collection Time    04/12/13 10:15 PM      Result Value Range Status   MRSA by PCR POSITIVE (*) NEGATIVE Final   Comment: RESULT CALLED TO, READ BACK BY AND VERIFIED WITH:     BREWER D. AT 0025 ON [id number] BY THOMPSON S.                The GeneXpert MRSA Assay (FDA     approved for NASAL specimens     only), is one component of a     comprehensive MRSA colonization     surveillance program. It is not     intended to diagnose MRSA     infection nor to guide or     monitor treatment for     MRSA infections.     Basic Metabolic Panel:  Recent Labs  78/29/56 0447 04/16/13 0705  NA 140 146*  K 3.4* 3.9  CL 103 110  CO2 23 23  GLUCOSE 151* 182*  BUN 12 9  CREATININE 0.89 0.77  CALCIUM 9.4 9.8       CBC:  Recent Labs  04/14/13 0634 04/15/13 0447  WBC 16.2* 9.1  HGB 12.3 11.2*  HCT 36.5 33.2*  MCV 88.6 88.3  PLT  288 249    Dg Chest Port 1 View  04/15/2013   CLINICAL DATA:  Evaluate pneumonia  EXAM: PORTABLE CHEST - 1 VIEW  COMPARISON:  CT 04/13/2013  FINDINGS: Normal mediastinum and cardiac silhouette. Normal pulmonary vasculature. No evidence of effusion, infiltrate, or pneumothorax. Mild left basilar atelectasis. No acute bony abnormality.  IMPRESSION: No evidence of pneumonia.   Electronically Signed   By: Genevive Bi M.D.   On: 04/15/2013 10:35      Medications: I have reviewed the patient's current medications.  Impression: 1. 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 She is on aspirin and Plavix. 2. COPD, stable. No evidence of aspiration pneumonia on CT chest scan. 3. Type 2 diabetes mellitus. 4. Hypertension. 5. Hypokalemia, resolved. 6. Dysphagia, improving. 7. Hypernatremia.     Plan: 1. Continue with current therapy. 2. Discontinue ACE inhibitor and start Norvasc. 3. Hopefully, if her sodium is improving, she can go home tomorrow..  Consultants:  None.   Procedures:  None.   Antibiotics:  Antibiotics discontinued 04/14/2013.                  Code Status: Full code.  Family Communication: Discussed plan with patient at the bedside.   Disposition Plan: Depending on progress.  Time spent: 20 minutes.   LOS: 4 days   Wilson Singer Pager [phone]  04/16/2013, 10:01 AM

## 2013-04-17 DIAGNOSIS — E870 Hyperosmolality and hypernatremia: Secondary | ICD-10-CM

## 2013-04-17 LAB — GLUCOSE, CAPILLARY
Glucose-Capillary: 155 mg/dL — ABNORMAL HIGH (ref 70–99)
Glucose-Capillary: 160 mg/dL — ABNORMAL HIGH (ref 70–99)

## 2013-04-17 LAB — BASIC METABOLIC PANEL
BUN: 10 mg/dL (ref 6–23)
CO2: 27 mEq/L (ref 19–32)
Calcium: 10 mg/dL (ref 8.4–10.5)
Creatinine, Ser: 0.81 mg/dL (ref 0.50–1.10)
Glucose, Bld: 176 mg/dL — ABNORMAL HIGH (ref 70–99)

## 2013-04-17 MED ORDER — POTASSIUM CHLORIDE IN NACL 20-0.9 MEQ/L-% IV SOLN
INTRAVENOUS | Status: DC
  Administered 2013-04-17: 10:00:00 via INTRAVENOUS

## 2013-04-17 MED ORDER — POTASSIUM CHLORIDE 10 MEQ/100ML IV SOLN
10.0000 meq | INTRAVENOUS | Status: AC
  Administered 2013-04-17 (×2): 10 meq via INTRAVENOUS
  Filled 2013-04-17 (×2): qty 100

## 2013-04-17 MED ORDER — POTASSIUM CHLORIDE IN NACL 20-0.45 MEQ/L-% IV SOLN
INTRAVENOUS | Status: DC
  Administered 2013-04-17 – 2013-04-18 (×2): via INTRAVENOUS
  Filled 2013-04-17 (×2): qty 1000

## 2013-04-17 MED ORDER — POTASSIUM CHLORIDE 20 MEQ/15ML (10%) PO LIQD
40.0000 meq | Freq: Once | ORAL | Status: DC
  Filled 2013-04-17: qty 30

## 2013-04-17 NOTE — Progress Notes (Signed)
[facility] PROGRESS NOTE  Christine Durham RUE:[id number] DOB: 1940/11/05 DOA: 04/12/2013 PCP: Ignatius Specking., MD  Assessment/Plan: 72 y.o. female. [name], with some dementia, and past history of stroke, admitted to [facility] on September 10 with acute left hemispheric stroke, manifesting as dysphagia dysarthria and right hemiplegia. MRI/MRA showed stenosis of the carotid arteries. Plavix was added to her regimen and she was discharged to skilled nursing facility on a pured diet for rehabilitation. 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   Assessment/Plan:   1. COPD improved;  No evidence of aspiration pneumonia on CT chest scan -cont bronchodilators;   2. Progression of Acute CVA with dysphagia dysarthria and right hemiplegia.  -MRI (10/1): Progression of the previously identified left hemisphere multifocal infarcts in a watershed distribution. The left internal carotid artery which was previously stenotic but patent on prior MRI/ MRA from 03/22/2013 is now thrombosed.  -recent angiogram by vascular: internal carotid artery is occluded approximately 2 cm above the bifurcation (9/29)  -cont statin, ASA+plavix  -Dysphagia, improving  3. DM recent HA1C-8.0; home meds metformin;  -cont ISS   4. HTN hold ace, started amlodipine   5. Hyper Na; change fluids to 1/2 NS+ kcl; recheck in AM   Code Status: full  Family Communication: none a the bedside  (indicate person spoken with, relationship, and if by phone, the number) Disposition Plan: home    Consultants:    Procedures:  CT chest   Antibiotics:  D/c 10/3 (indicate start date, and stop date if known)  HPI/Subjective: Alert    Objective: Filed Vitals:   04/17/13 0551  BP: 166/76  Pulse: 86  Temp: 98.3 F (36.8 C)  Resp: 16    Intake/Output Summary (Last 24 hours) at 04/17/13 1118 Last data filed at 04/16/13 1800  Gross per 24 hour  Intake 421.33 ml  Output      0 ml  Net 421.33 ml   Filed Weights   04/12/13 2008 04/12/13 2209  Weight: 55.929 kg (123 lb 4.8 oz) 56.1 kg (123 lb 10.9 oz)    Exam:   General:  Alert   Cardiovascular: s1,s2 rrr  Respiratory: cta bl   Abdomen: soft, nt   Musculoskeletal: no ede   Data Reviewed: Basic Metabolic Panel:  Recent Labs Lab 04/12/13 1732 04/14/13 0634 04/15/13 0447 04/16/13 0705 04/17/13 0637  NA 139 139 140 146* 150*  K 4.1 4.3 3.4* 3.9 3.2*  CL 99 99 103 110 111  CO2 27 27 23 23 27   GLUCOSE 136* 109* 151* 182* 176*  BUN 14 9 12 9 10   CREATININE 0.64 0.76 0.89 0.77 0.81  CALCIUM 10.3 10.3 9.4 9.8 10.0   Liver Function Tests:  Recent Labs Lab 04/12/13 1732  AST 13  ALT 13  ALKPHOS 127*  BILITOT 0.3  PROT 8.2  ALBUMIN 4.1   No results found for this basename: LIPASE, AMYLASE,  in the last 168 hours No results found for this basename: AMMONIA,  in the last 168 hours CBC:  Recent Labs Lab 04/12/13 1732 04/14/13 0634 04/15/13 0447  WBC 13.4* 16.2* 9.1  NEUTROABS 8.7*  --   --   HGB 12.8 12.3 11.2*  HCT 37.9 36.5 33.2*  MCV 89.4  88.6 88.3  PLT 312 288 249   Cardiac Enzymes:  Recent Labs Lab 04/12/13 1732  TROPONINI <0.30   BNP (last 3 results)  Recent Labs  06/26/12 2320  PROBNP 150.2*   CBG:  Recent Labs Lab 04/16/13 0741 04/16/13 1140 04/16/13 1630 04/16/13 2023 04/17/13 0740  GLUCAP 164* 201* 100* 286* 160*    Recent Results (from the past 240 hour(s))  MRSA PCR SCREENING     Status: Abnormal   Collection Time    04/12/13 10:15 PM      Result Value Range Status   MRSA by PCR POSITIVE (*) NEGATIVE Final   Comment: RESULT CALLED TO, READ BACK BY AND VERIFIED WITH:     BREWER D. AT 0025 ON  [id number] BY THOMPSON S.                The GeneXpert MRSA Assay (FDA     approved for NASAL specimens     only), is one component of a     comprehensive MRSA colonization     surveillance program. It is not     intended to diagnose MRSA     infection nor to guide or     monitor treatment for     MRSA infections.     Studies: No results found.  Scheduled Meds: . amLODipine  5 mg Oral Daily  . antiseptic oral rinse  15 mL Mouth Rinse BID  . aspirin  300 mg Rectal Daily   Or  . aspirin  325 mg Oral Daily  . enoxaparin (LOVENOX) injection  40 mg Subcutaneous Q24H  . insulin aspart  0-9 Units Subcutaneous Q4H  . mupirocin ointment  1 application Nasal BID   Continuous Infusions: . 0.9 % NaCl with KCl 20 mEq / L 75 mL/hr at 04/17/13 1029    Active Problems:   Diabetes mellitus without complication   NICM (nonischemic cardiomyopathy)   Hypotension   COPD (chronic obstructive pulmonary disease)   Acute ischemic stroke   Dysphagia   possible Aspiration pneumonia    Time spent:  > 35 minutes     Esperanza Sheets  [facility] Pager [phone]. If 7PM-7AM, please contact night-coverage at [email or web address], password [facility] 04/17/2013, 11:18 AM  LOS: 5 days

## 2013-04-17 NOTE — Plan of Care (Signed)
Problem: Progression Outcomes Goal: Progressive activity as tolerated Outcome: Progressing 04/17/13 1246 Patient assisted up to chair with PT this morning, tolerates sitting up well. Patient's family requested she not ambulate in hallway with PT today to avoid overworking herself. Discussed family concerns with PT this morning. Pt states comfortable up in chair. Chair alarm on for safety, call light kept within reach. Earnstine Regal, RN

## 2013-04-17 NOTE — Progress Notes (Signed)
Physical Therapy Treatment Patient Details Name: Christine Durham MRN: [id number] DOB: 03-23-1941 Today's Date: 04/17/2013 Time: 1035-1100 PT Time Calculation (min): 25 min  PT Assessment / Plan / Recommendation  History of Present Illness Pt states "I cant walk" consecutively, however only requires min assist.  Able to increase ambulation distance to 25 feet today using RW.  Pt required AA to complete SLR due to weakness.  Pt returned to recliner.     Progress towards PT Goals Progress towards PT goals: Progressing toward goals  Plan Current plan remains appropriate    Precautions / Restrictions         Mobility  Bed Mobility Rolling Right: 6: Modified independent (Device/Increase time);With rail Rolling Left: 6: Modified independent (Device/Increase time);With rail Supine to Sit: 4: Min assist;HOB flat Sit to Supine: Not Tested (comment) Details for Bed Mobility Assistance: Pt with difficulty coordinating movement to scoot to EOB Transfers Transfers: Sit to Stand;Stand to Sit Sit to Stand: 4: Min assist Stand to Sit: 4: Min assist Ambulation/Gait Ambulation/Gait Assistance: 4: Min assist Ambulation Distance (Feet): 25 Feet Assistive device: Rolling walker Gait Pattern: Decreased step length - right;Decreased step length - left Gait velocity: slow and not labored    Exercises General Exercises - Lower Extremity Ankle Circles/Pumps: Both;10 reps;Supine Gluteal Sets:  (bridging) Long Arc Quad: Both;10 reps;Seated Heel Slides: Both;10 reps;Supine Hip ABduction/ADduction: Both;10 reps;Supine Straight Leg Raises: Both;10 reps;Supine   PT Goals (current goals can now be found in the care plan section)    Visit Information  Last PT Received On: 04/17/13    Subjective Data   " I can't walk"   End of Session PT - End of Session Equipment Utilized During Treatment: Gait belt Activity Tolerance: Patient tolerated treatment well Patient left: in chair;with call bell/phone  within reach;with chair alarm set     Lurena Nida, PTA/CLT 04/17/2013, 12:22 PM

## 2013-04-17 NOTE — Progress Notes (Addendum)
Patient ID: Christine Durham, female   DOB: [birth year], 72 y.o.   MRN: [id number]           PROGRESS NOTE  DATE:  04/12/2013  FACILITY: [facility]   LEVEL OF CARE:   SNF   Acute Visit/Discharge Visit   CHIEF COMPLAINT:  Pre-discharge review.     HISTORY OF PRESENT ILLNESS:  Christine Durham is a lady who came to [place] after a stay at [facility] from 03/22/2013 through 03/25/2013.  She has a history of presumably a right-sided stroke in 2011 with left-sided deficits including left arm and left leg weakness.    She was admitted to hospital on this occasion.  An MRI showed an acute nonhemorrhagic left frontal infarct, a remote left caudate nucleus infarct which was partially hemorrhagic.  She had remote thalamic and basal ganglia infarcts.  An ultrasound showed a 70% stenosis of the left internal carotid artery.  An echocardiogram was within normal limits.     The patient has been followed by Dr. Cari Caraway.  She went on to have an arteriogram due to the suggestion on the MRI that she had distal occlusion of the left internal carotid artery.  Indeed, the left internal carotid artery is occluded approximately 2 cm above the bifurcation.    In terms of her post stroke recovery, she has done reasonably well.  She is able to walk for 200 ft with a walker, although she has some difficulty getting up from the wheelchair.  Once she does this, she seems to do fairly well.    Her major difficulty is in swallowing.   She is on a pureed diet with nectar-thick liquids.    She also has obvious trouble with dysarthria and handling her oral secretions (drools a lot).    SOCIAL HISTORY:   FUNCTIONAL STATUS:  She is  apparently going home to live with her sister.  This is going to be a difficult issue.  I will ask the staff about what we can do to help arrange some home support.  The [facility] program might also be an alternative here, although I note that she is Full Code.      PHYSICAL EXAMINATION:    VITAL SIGNS:   O2 SATURATIONS:  92% on room air.   PULSE:   71.   RESPIRATIONS:   18.   GENERAL APPEARANCE:  The patient is very disabled by bihemispheric strokes.  She is drooling.   CHEST/RESPIRATORY:  Shallow, but otherwise clear air entry bilaterally.   CARDIOVASCULAR:  CARDIAC:   Heart sounds are distant.  There is no obvious congestive heart failure.  She appears to be euvolemic.   EDEMA/VARICOSITIES:  Extremities:  No edema.   NEUROLOGICAL:    SENSATION/STRENGTH:  She has difficulties with her right arm and right hand.   BALANCE/GAIT:  She is able to bring herself to the standing position with stand-by assist of one.   She does well with her rolling walker, although I am not sure I would be comfortable with this going independently to the home environment.    ASSESSMENT/PLAN:  Late-effect, now bihemispheric strokes.  She has significant dysarthria and dysphagia with a pureed diet and nectar-thick liquids.  I will see if the family has been educated about the diet.  I cannot see this lady being alone for any period of time.  She is high risk for falls.  She is on Plavix and aspirin in terms of stroke prevention.  Type 2 diabetes.  On oral agents.  As far as I can tell, this is stable.    Nonischemic cardiomyopathy.   There is no evidence of heart failure at the bedside.  Her echocardiogram previously showed normal left ventricular function.    COPD.  She quit smoking three years ago.  Although I think this is very significant, she is not wheezing.  Her work of breathing appears to be normal.  She does, however, have digital clubbing.  She probably needs a CT scan of her chest.  I will leave this to her primary physician.    Digital clubbing.  Depending on the chronicity of this, I would suggest an enhanced CT scan of the chest.    CPT CODE: 16109  ADDENDUM:  I was called later on today to review this woman who "didn't look the same" in physical therapy.  I came to see her at the  bedside.  She had returned to the room.  It was not completely obvious to me that anything new had occurred.   She is certainly dysarthric.  However, the major issue seems to be perhaps a decline in her swallowing, as reiterated by the speech pathologist.   I kept her in the building transiently and ordered an outpatient MRI.  However, I was called later to a report that she really was not able to handle her lunch at all.  Normally, this woman could feed herself her pureed diet.  I, therefore, have sent her to the emergency room with presumption of a recurrent stroke.

## 2013-04-18 LAB — BASIC METABOLIC PANEL
BUN: 10 mg/dL (ref 6–23)
CO2: 27 mEq/L (ref 19–32)
Calcium: 10.2 mg/dL (ref 8.4–10.5)
GFR calc Af Amer: 81 mL/min — ABNORMAL LOW (ref >90)
GFR calc non Af Amer: 70 mL/min — ABNORMAL LOW (ref >90)
Sodium: 147 mEq/L — ABNORMAL HIGH (ref 135–145)

## 2013-04-18 LAB — GLUCOSE, CAPILLARY
Glucose-Capillary: 110 mg/dL — ABNORMAL HIGH (ref 70–99)
Glucose-Capillary: 146 mg/dL — ABNORMAL HIGH (ref 70–99)

## 2013-04-18 MED ORDER — AMLODIPINE BESYLATE 5 MG PO TABS: 10.0000 mg | ORAL_TABLET | Freq: Every day | ORAL | Status: DC

## 2013-04-18 NOTE — Progress Notes (Signed)
Patient discharged home.  HH in place and equipment being delivered to home.  IV removed - WNL.  FU with PCP in place.  Educated and instructed family on changes to medications and new ones.  Stressed importance of taking meds as prescribed to prevent future stroke.  Family knowledgeable on thickening liquids appropriately.  [place] and education given regarding stroke.  No questions at this time.  Pt stable to DC.  Left floor via WC with NT.

## 2013-04-18 NOTE — Discharge Summary (Signed)
Physician Discharge Summary  Christine Durham:[id number] DOB: September 25, 1940 DOA: 04/12/2013  PCP: Ignatius Specking., MD  Admit date: 04/12/2013 Discharge date: 04/18/2013  Time spent: >35  minutes  Recommendations for Outpatient Follow-up:   F/u with PCP in 2 weeks  Discharge Diagnoses:  Active Problems:   Diabetes mellitus without complication   NICM (nonischemic cardiomyopathy)   Hypotension   COPD (chronic obstructive pulmonary disease)   Acute ischemic stroke   Dysphagia   possible Aspiration pneumonia   Discharge Condition: stable   Diet recommendation: heart healthy   Filed Weights   04/12/13 2008 04/12/13 2209  Weight: 55.929 kg (123 lb 4.8 oz) 56.1 kg (123 lb 10.9 oz)    History of present illness:  72 y.o. female. Christine Durham, with some dementia, and past history of stroke, admitted to [facility] on September 10 with acute left hemispheric stroke, manifesting as dysphagia dysarthria and right hemiplegia. MRI/MRA showed stenosis of the carotid arteries. Plavix was added to her regimen and she was discharged to skilled nursing facility on a pured diet for rehabilitation. 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   Hospital Course: 1. COPD improved; No evidence of aspiration pneumonia on CT chest scan  -cont bronchodilators;  2. Progression of Acute CVA with dysphagia dysarthria and right hemiplegia.  -MRI (10/1): Progression of the previously identified left hemisphere multifocal infarcts in a watershed distribution. The left internal carotid artery which was previously stenotic but patent on prior MRI/ MRA from 03/22/2013 is now thrombosed.  -recent angiogram by vascular: internal carotid artery  is occluded approximately 2 cm above the bifurcation (9/29)  -cont statin, ASA+plavix  -Dysphagia, improving  3. DM recent HA1C-8.0; home meds metformin;   4. HTN started amlodipine  5. Hyper Na; improving;    Patient was recommended CIR, but refused wanted to go home';   Procedures:  CT chest  (i.e. Studies not automatically included, echos, thoracentesis, etc; not x-rays)  Consultations:  None   Discharge Exam: Filed Vitals:   04/18/13 0426  BP: 166/70  Pulse: 82  Temp: 98.6 F (37 C)  Resp: 20    General: alert  Cardiovascular: s1,s2 rrr Respiratory: cta BL   Discharge Instructions  Discharge Orders   Future Appointments Provider Department Dept Phone   10/04/2013 10:30 AM [facility] [address] [phone]   10/04/2013 11:30 AM Chuck Hint, MD [facility] [phone]   Future Orders Complete By Expires   Diet - low sodium heart healthy  As directed    Discharge instructions  As directed    Comments:     Follow up with primary care doctor in 2 weeks   Increase activity slowly  As directed        Medication List    STOP taking these medications       lisinopril 5 MG tablet  Commonly known as:  PRINIVIL,ZESTRIL     moxifloxacin 400 MG tablet  Commonly known as:  AVELOX      TAKE these medications       albuterol 108 (90 BASE) MCG/ACT inhaler  Commonly known as:  PROVENTIL HFA;VENTOLIN HFA  Inhale 2 puffs into the lungs every 6 (six) hours as  needed for wheezing.     amLODipine 5 MG tablet  Commonly known as:  NORVASC  Take 2 tablets (10 mg total) by mouth daily.     aspirin EC 81 MG tablet  Take 1 tablet (81 mg total) by mouth daily.     atorvastatin 10 MG tablet  Commonly known as:  LIPITOR  Take 2 tablets (20 mg total) by mouth at bedtime.     clopidogrel 75 MG tablet  Commonly known as:  PLAVIX  Take 1 tablet (75 mg total) by mouth daily with breakfast.     donepezil  5 MG tablet  Commonly known as:  ARICEPT  Take 5 mg by mouth at bedtime.     guaiFENesin 600 MG 12 hr tablet  Commonly known as:  MUCINEX  Take 600 mg by mouth 2 (two) times daily. 5 day course     metFORMIN 500 MG tablet  Commonly known as:  GLUCOPHAGE  Take 1 tablet (500 mg total) by mouth 2 (two) times daily with a meal.     oxyCODONE-acetaminophen 5-325 MG per tablet  Commonly known as:  PERCOCET/ROXICET  Take 2 tablets by mouth every 4 (four) hours as needed for pain.       No Known Allergies     Follow-up Information   Follow up with VYAS,DHRUV B., MD In 2 weeks.   Specialty:  Internal Medicine   Contact information:   [address] [place] [address] [phone]        The results of significant diagnostics from this hospitalization (including imaging, microbiology, ancillary and laboratory) are listed below for reference.    Significant Diagnostic Studies: Dg Chest 1 View  04/11/2013   CLINICAL DATA:  Cough.  EXAM: CHEST - 1 VIEW  COMPARISON:  03/22/2013  FINDINGS: The heart size is normal. Main pulmonary arteries are prominent suggesting pulmonary arterial hypertension. The lungs are somewhat hyperinflated suggesting emphysema. Tiny area scarring at the left base laterally. There appears to be a slight compression deformity of T12, age indeterminate.  IMPRESSION: Probable COPD. Possible compression fracture of T12, age indeterminate.   Electronically Signed   By: Geanie Cooley   On: 04/11/2013 14:42   Dg Chest 1 View  03/22/2013   *RADIOLOGY REPORT*  Clinical Data: Cough  CHEST - 1 VIEW  Comparison: 06/30/2012  Findings: The cardiac shadow is stable.  The lungs are clear bilaterally.  No acute bony abnormality is seen.  IMPRESSION: No acute abnormality noted.   Original Report Authenticated By: Alcide Clever, M.D.   Ct Head Wo Contrast  03/22/2013   *RADIOLOGY REPORT*  Clinical Data: Dysphagia.  Aphasia.  History of stroke.  CT HEAD WITHOUT CONTRAST  Technique:   Contiguous axial images were obtained from the base of the skull through the vertex without contrast.  Comparison: None.  Findings: The ventricles are normal in overall configuration. There is mild ex vacuo dilation of the frontal horn of the left lateral ventricle from old adjacent deep white matter infarct. There is generalized ventricular and sulcal enlargement reflecting age related volume loss.  There are no parenchymal masses or mass effect.  There is a small lacunar infarct in the central left frontal lobe white matter and in the right internal capsule, posterior limb.  Patchy areas of white matter hypoattenuation are also noted consistent with moderate chronic microvascular ischemic change.  There is no evidence of a recent transcortical infarct.  There are no extra-axial masses or abnormal fluid collections.  There is  no intracranial hemorrhage.  The visualized sinuses and the mastoid air cells are clear.  IMPRESSION: No acute intracranial abnormalities.  Age related volume loss, old infarcts and moderate chronic microvascular ischemic change.   Original Report Authenticated By: Amie Portland, M.D.   Ct Chest Wo Contrast  04/13/2013   CLINICAL DATA:  Shortness of breath.  EXAM: CT CHEST WITHOUT CONTRAST  TECHNIQUE: Multidetector CT imaging of the chest was performed following the standard protocol without IV contrast.  COMPARISON:  Chest radiograph 04/12/2013  FINDINGS: There is no pleural effusion identified. No pleural effusion identified. There is mild changes of centrilobular emphysema noted. No airspace consolidation or atelectasis identified. Subpleural nodule in the right middle lobe measures 3 mm, image 38/ series 3. There is a nodule at the right base which measures 5 mm, image 43/series 3.  The trachea appears patent and is midline. The heart size is normal. No pericardial effusion. There is a lower right paratracheal lymph node measuring 8 mm, image 23/series 2. Left paratracheal lymph node  measures 8 mm, image 22/ series 2. The esophagus appears within normal limits. There is no axillary or supraclavicular lymph nodes.  Limited imaging through the upper abdomen shows several calcified granulomas within the liver. Review of the visualized bony structures is significant for mild spondylosis. No aggressive lytic or sclerotic bone lesions. T12 compression fracture is identified with loss of 50% of the vertebral body height. This is age indeterminate.  IMPRESSION: 1. No acute cardiopulmonary abnormalities.  2. Subpleural nodule in the right middle lobe and parenchymal nodule in the right base are both nonspecific. If the patient is at high risk for bronchogenic carcinoma, follow-up chest CT at 6-12 months is recommended. If the patient is at low risk for bronchogenic carcinoma, follow-up chest CT at 12 months is recommended. This recommendation follows the consensus statement: Guidelines for Management of Small Pulmonary Nodules Detected on CT Scans: A Statement from the Fleischner Society as published in Radiology 2005;237:395-400.  3. Age indeterminate T12 compression fracture with loss of 40% of the vertebral body height.   Electronically Signed   By: Signa Kell M.D.   On: 04/13/2013 14:03   Mr Maxine Glenn Head Wo Contrast  03/23/2013   *RADIOLOGY REPORT*  Clinical Data: Acute left MCA territory infarcts.  MRA HEAD WITHOUT CONTRAST  Technique: Angiographic images of the Circle of Willis were obtained using MRA technique without intravenous contrast.  Comparison: MRI brain 03/22/2013.  Findings: There is significant asymmetry of signal in the internal carotid arteries.  Irregularity suggests atherosclerotic changes within the right cavernous carotid artery without a significant stenosis.  There is significant signal loss within the left petrous internal carotid artery and the left cavernous internal carotid artery.  Decreased signal is evident in the left A1 segment compared the right.  Signal loss in the  A1 segments anteriorly on both sides is likely artifactual.  The right M1 segments demonstrates relatively normal signal although there is significant signal loss within the proximal branch vessels on the right.  There is complete signal loss in the distal left M1 segment with some reconstitution with some demonstration of flow and more distal left MCA branch vessels.  Moderate stenoses are present in the proximal vertebral arteries bilaterally.  The vertebral basilar junction is within normal limits.  The basilar artery is within normal limits.  Both posterior cerebral arteries originate from basilar tip.  There is moderate stenosis in the proximal right P1 segment.  The distal branch vessels are not well  seen.  IMPRESSION:  1.  Significant asymmetric signal in the internal carotid arteries bilaterally.  While there may be stenoses within the left petrous and cavernous internal carotid arteries, I suspect this is secondary to a more proximal stenosis, likely at the carotid bifurcation. 2.  The study is moderately degraded by patient motion. 3.  Significant signal loss and distal vessels suggesting moderate small vessel disease. 4.  Moderate vertebral artery stenoses bilaterally. 5.  Moderate proximal right posterior cerebral artery stenosis. 6.  Probable right A1 segment stenosis in addition to the more proximal stenoses.   Original Report Authenticated By: Marin Roberts, M.D.   Mr Brain Wo Contrast  04/12/2013   CLINICAL DATA:  Worsening right-sided weakness, drooling, and dysphagia. Recent admission for left hemisphere stroke.  EXAM: MRI HEAD WITHOUT CONTRAST  TECHNIQUE: Multiplanar, multisequence MR imaging was performed. No intravenous contrast was administered.  COMPARISON:  MRI 03/22/2013. MRA 03/23/2013. Report of cerebral angiogram from 04/10/2013 indicates left internal carotid artery thrombosis.  FINDINGS: There has been progression of the previously identified left hemisphere infarction.  Multiple frontal, posterior frontal, basal ganglia, insular, parietal, and occipital lobe subcentimeter lesions with restricted diffusion affect the left hemisphere cortex and subcortical white matter in a linear fashion consistent with a watershed type event due to internal carotid artery occlusion.  There is no evidence for flow related enhancement in the left internal carotid artery skull base or cavernous segments. This represents a change from previous MRI and MRA and is consistent with thrombosis found on angiography.  A small focus of acute infarction affects the right occipital cortex, of unknown significance.  Motion degraded exam with atrophy and chronic microvascular ischemic change. Remote left basal ganglia hemorrhagic infarct re-demonstrated. No evidence for acute hemorrhage on today's examination.  Scalp, calvarium, orbits, sinuses, mastoids, and other visualized extracranial soft tissues grossly unremarkable.  IMPRESSION: Progression of the previously identified left hemisphere multifocal infarcts in a watershed distribution. The left internal carotid artery which was previously stenotic but patent on prior MRI/ MRA from 03/22/2013 is now thrombosed. Subcentimeter right occipital infarct incidentally noted.  Baseline atrophy and chronic microvascular ischemic change without intracranial hemorrhage or mass-effect.   Electronically Signed   By: Davonna Belling M.D.   On: 04/12/2013 18:43   Mr Brain Wo Contrast  03/22/2013   *RADIOLOGY REPORT*  Clinical Data: Dysphagia and aphasia for 3 days.  MRI HEAD WITHOUT CONTRAST  Technique:  Multiplanar, multiecho pulse sequences of the brain and surrounding structures were obtained according to standard protocol without intravenous contrast.  Comparison: 03/22/2013 head CT.  No comparison brain MR.  Findings: Motion degraded exam. Sequences were repeated.  Acute non hemorrhagic left frontal - parietal lobe infarcts.  Remote left caudate/lenticular nucleus  infarct which was partially hemorrhagic containing blood breakdown products otherwise no evidence intracranial hemorrhage.  Remote thalamic and basal ganglia infarcts.  Marked small vessel disease type changes.  Global atrophy without hydrocephalus.  Abnormal appearance of the right internal carotid artery which may be the significantly narrowed.  IMPRESSION: Motion degraded exam. Sequences were repeated.  Acute non hemorrhagic left frontal - parietal lobe infarcts.  Remote left caudate/lenticular nucleus infarct which was partially hemorrhagic containing blood breakdown products otherwise no evidence intracranial hemorrhage.  Remote thalamic and basal ganglia infarcts.  Marked small vessel disease type changes.  Global atrophy without hydrocephalus.  Abnormal appearance of the right internal carotid artery which may be significantly narrowed.  Critical Value/emergent results were called by telephone at the time of interpretation  on 03/22/2013 at 3:58 p.m. to  Dr. Lynelle Doctor., who verbally acknowledged these results.   Original Report Authenticated By: Lacy Duverney, M.D.   US Carotid Duplex Bilateral  03/23/2013   *RADIOLOGY REPORT*  Clinical Data: Acute left frontal parietal infarcts, and normal left internal carotid artery by MRA  BILATERAL CAROTID DUPLEX ULTRASOUND  Technique: Wallace Cullens scale imaging, color Doppler and duplex ultrasound were performed of bilateral carotid and vertebral arteries in the neck.  Comparison:  03/23/2013  Criteria:  Quantification of carotid stenosis is based on velocity parameters that correlate the residual internal carotid diameter with NASCET-based stenosis levels, using the diameter of the distal internal carotid lumen as the denominator for stenosis measurement.  The following velocity measurements were obtained:                   PEAK SYSTOLIC/END DIASTOLIC RIGHT ICA:                        83/23cm/sec CCA:                        71/12cm/sec SYSTOLIC ICA/CCA RATIO:     1.15 DIASTOLIC  ICA/CCA RATIO:    1.89 ECA:                        83cm/sec  LEFT ICA:                        564/202cm/sec CCA:                        56/5cm/sec SYSTOLIC ICA/CCA RATIO:     9.99 DIASTOLIC ICA/CCA RATIO:    40.46 ECA:                        120cm/sec  Findings:  RIGHT CAROTID ARTERY: Mild plaque formation with calcification of the right carotid system.  No hemodynamically significant right ICA stenosis, velocity elevation, or turbulent flow.  Degree of narrowing less than 50%.  RIGHT VERTEBRAL ARTERY:  Antegrade  LEFT CAROTID ARTERY: Significant irregular heterogeneous plaque formation of the proximal right ICA.  This narrows the lumen by gray scale imaging.  In this region there is focal aliasing and diffuse spectral broadening.  Significant velocity elevation in the proximal to mid left ICA as above.  Findings are compatible with a hemodynamically significant left ICA stenosis, greater than 70%.  LEFT VERTEBRAL ARTERY:  Antegrade  IMPRESSION: Hemodynamically significant left ICA stenosis estimated greater than 70%.  Mild right ICA narrowing less than 50%.  Patent antegrade vertebral flow bilaterally   Original Report Authenticated By: Judie Petit. Miles Costain, M.D.   Dg Chest Port 1 View  04/15/2013   CLINICAL DATA:  Evaluate pneumonia  EXAM: PORTABLE CHEST - 1 VIEW  COMPARISON:  CT 04/13/2013  FINDINGS: Normal mediastinum and cardiac silhouette. Normal pulmonary vasculature. No evidence of effusion, infiltrate, or pneumothorax. Mild left basilar atelectasis. No acute bony abnormality.  IMPRESSION: No evidence of pneumonia.   Electronically Signed   By: Genevive Bi M.D.   On: 04/15/2013 10:35   Dg Chest Portable 1 View  04/12/2013   CLINICAL DATA:  Extreme weakness, dysphagia, stroke, history asthma, diabetes, COPD, smoking, MI  EXAM: PORTABLE CHEST - 1 VIEW  COMPARISON:  Portable exam 1721 hr compared to 04/11/2013  FINDINGS: Normal heart size, mediastinal contours, and pulmonary vascularity.  Atherosclerotic  calcification aorta.  Emphysematous and bronchitic changes consistent with COPD.  No definite infiltrate, pleural effusion, or pneumothorax.  Diffuse osseous demineralization.  IMPRESSION: COPD changes.  No acute abnormalities.   Electronically Signed   By: Ulyses Southward M.D.   On: 04/12/2013 17:37   Dg Swallowing Func-speech Pathology  03/24/2013   Renee S Crutchfield, CCC-SLP     03/24/2013 12:40 PM Objective Swallowing Evaluation: Modified Barium Swallowing Study   Patient Details  Name: Laree Garron MRN: [id number] Date of Birth: 08-11-1940  Today's Date: 03/24/2013 Time: 4098-1191 SLP Time Calculation (min): 30 min  Past Medical History:  Past Medical History  Diagnosis Date  . 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 Diabetes mellitus without complication   . Asthma   . HTN (hypertension)   . Tobacco abuse   . 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 Appendectomy     HPI:  72 yo female with 3 day history of acute dysphagia and  dysarthria. MRI shows: Acute non hemorrhagic left frontal -  parietal lobe infarcts.  Remote left caudate/lenticular nucleus  infarct which was partially hemorrhagic containing blood  breakdown products otherwise no evidence intracranial hemorrhage.   Remote thalamic and basal ganglia infarcts.  Marked small vessel  disease type changes.  Global atrophy without hydrocephalus.   Abnormal appearance of the right internal carotid artery which  may be significantly narrowed.     Assessment / Plan / Recommendation Clinical Impression  Dysphagia Diagnosis: Moderate oral phase dysphagia;Moderate  pharyngeal phase dysphagia Clinical impression: Pt presents with moderate oropharyngeal  dysphagia characterized by delayed oral transit, impaired  mastication, premature spillage into valleculae and pyriform  sinuses, delayed swallow initiation and adequate laryngeal  excursion resulting in silent penetration and aspiration of  thin  liquids as well as residuals in lateral channels. Controlled  bolus size prevented penetration and aspiration. During the  study, pt c/o severe sharp chest pain and an RN was notified.  Pain subsided and pt was able to complete MBSS. Recommend pureed  diet with nectar liquids with staff supervision for small  sips/bites. SLP to follow clinically for upgrades to mechanical  soft as appropriate.     Treatment Recommendation  Therapy as outlined in treatment plan below    Diet Recommendation Dysphagia 1 (Puree);Nectar-thick liquid   Liquid Administration via: Cup Medication Administration: Crushed with puree Supervision: Full supervision/cueing for compensatory strategies Compensations: Small sips/bites;Slow rate Postural Changes and/or Swallow Maneuvers: Seated upright 90  degrees;Upright 30-60 min after meal    Other  Recommendations Oral Care Recommendations: Staff/trained  caregiver to provide oral care;Oral care BID Other Recommendations: Clarify dietary restrictions;Remove water  pitcher   Follow Up Recommendations  Skilled Nursing facility    Frequency and Duration min 3x week  1 week       SLP Swallow Goals Patient will consume recommended diet without observed clinical  signs of aspiration with: Minimal assistance Patient will utilize recommended strategies during swallow to  increase swallowing safety with: Minimal assistance   General Date of Onset: 03/20/13 HPI: 72 yo female with 3 day history of acute dysphagia and  dysarthria. MRI shows: Acute non hemorrhagic left frontal -  parietal lobe infarcts.  Remote left caudate/lenticular nucleus  infarct which was partially hemorrhagic containing blood  breakdown products otherwise no evidence intracranial hemorrhage.   Remote thalamic and basal ganglia infarcts.  Marked small vessel  disease type changes.  Global atrophy without hydrocephalus.   Abnormal appearance of the right internal carotid artery which  may be significantly narrowed. Type of Study:  Modified Barium Swallowing Study Reason for Referral: Objectively evaluate swallowing function Previous Swallow Assessment: BSE yesterday. NPO. Diet Prior to this Study: NPO Temperature Spikes Noted: No Respiratory Status: Room air History of Recent Intubation: No Behavior/Cognition: Alert;Cooperative;Requires cueing Oral Cavity - Dentition: Poor condition Oral Motor / Sensory Function: Impaired - see Bedside swallow  eval Self-Feeding Abilities: Needs set up Patient Positioning: Upright in bed Baseline Vocal Quality: Low vocal intensity Volitional Cough: Weak Volitional Swallow: Able to elicit (delayed) Anatomy: Within functional limits Pharyngeal Secretions: Not observed secondary MBS    Reason for Referral Objectively evaluate swallowing function   Oral Phase Oral Preparation/Oral Phase Oral Phase: Impaired Oral - Nectar Oral - Nectar Teaspoon: Delayed oral transit Oral - Nectar Cup: Delayed oral transit Oral - Thin Oral - Thin Cup: Other (Comment) (decreased oral control) Oral - Solids Oral - Puree: Delayed oral transit;Lingual pumping Oral - Mechanical Soft: Delayed oral transit;Impaired mastication   Pharyngeal Phase Pharyngeal Phase Pharyngeal Phase: Impaired Pharyngeal - Nectar Pharyngeal - Nectar Teaspoon: Delayed swallow  initiation;Premature spillage to valleculae Pharyngeal - Nectar Cup: Delayed swallow initiation;Premature  spillage to valleculae;Premature spillage to pyriform  sinuses;Penetration/Aspiration before  swallow;Penetration/Aspiration during swallow;Lateral channel  residue (small sips prevented penetration) Penetration/Aspiration details (nectar cup): Material enters  airway, CONTACTS cords then ejected out;Material enters airway,  remains ABOVE vocal cords and not ejected out Pharyngeal - Thin Pharyngeal - Thin Cup: Delayed swallow initiation;Premature  spillage to valleculae;Premature spillage to pyriform  sinuses;Reduced airway/laryngeal closure;Penetration/Aspiration  during  swallow;Moderate aspiration;Lateral channel residue Penetration/Aspiration details (thin cup): Material enters  airway, passes BELOW cords without attempt by patient to eject  out (silent aspiration);Material enters airway, remains ABOVE  vocal cords then ejected out;Material enters airway, remains  ABOVE vocal cords and not ejected out Pharyngeal - Solids Pharyngeal - Puree: Delayed swallow initiation;Premature spillage  to valleculae Pharyngeal - Mechanical Soft: Delayed swallow  initiation;Premature spillage to valleculae  Cervical Esophageal Phase    GO    Cervical Esophageal Phase Cervical Esophageal Phase: WFL         Crutchfield, Renee S 03/24/2013, 12:39 PM     Microbiology: Recent Results (from the past 240 hour(s))  MRSA PCR SCREENING     Status: Abnormal   Collection Time    04/12/13 10:15 PM      Result Value Range Status   MRSA by PCR POSITIVE (*) NEGATIVE Final   Comment: RESULT CALLED TO, READ BACK BY AND VERIFIED WITH:     BREWER D. AT 0025 ON [id number] BY THOMPSON S.                The GeneXpert MRSA Assay (FDA     approved for NASAL specimens     only), is one component of a     comprehensive MRSA colonization     surveillance program. It is not     intended to diagnose MRSA     infection nor to guide or     monitor treatment for     MRSA infections.     Labs: Basic Metabolic Panel:  Recent Labs Lab 04/14/13 0634 04/15/13 0447 04/16/13 0705 04/17/13 0637 04/18/13 0609  NA 139 140 146* 150* 147*  K 4.3 3.4* 3.9 3.2* 3.6  CL 99 103 110 111 108  CO2 27 23 23  27  27  GLUCOSE 109* 151* 182* 176* 156*  BUN 9 12 9 10 10   CREATININE 0.76 0.89 0.77 0.81 0.83  CALCIUM 10.3 9.4 9.8 10.0 10.2   Liver Function Tests:  Recent Labs Lab 04/12/13 1732  AST 13  ALT 13  ALKPHOS 127*  BILITOT 0.3  PROT 8.2  ALBUMIN 4.1   No results found for this basename: LIPASE, AMYLASE,  in the last 168 hours No results found for this basename: AMMONIA,  in the last 168  hours CBC:  Recent Labs Lab 04/12/13 1732 04/14/13 0634 04/15/13 0447  WBC 13.4* 16.2* 9.1  NEUTROABS 8.7*  --   --   HGB 12.8 12.3 11.2*  HCT 37.9 36.5 33.2*  MCV 89.4 88.6 88.3  PLT 312 288 249   Cardiac Enzymes:  Recent Labs Lab 04/12/13 1732  TROPONINI <0.30   BNP: BNP (last 3 results)  Recent Labs  06/26/12 2320  PROBNP 150.2*   CBG:  Recent Labs Lab 04/17/13 1642 04/17/13 2107 04/18/13 0054 04/18/13 0428 04/18/13 0725  GLUCAP 116* 160* 110* 146* 142*       Signed:  Shailee Foots N  [facility] 04/18/2013, 9:21 AM

## 2013-04-29 ENCOUNTER — Emergency Department (HOSPITAL_COMMUNITY): Payer: Medicare Other

## 2013-04-29 ENCOUNTER — Inpatient Hospital Stay (HOSPITAL_COMMUNITY)
Admission: EM | Admit: 2013-04-29 | Discharge: 2013-05-09 | DRG: 682 | Disposition: A | Discharge status: Skilled Nursing Facility | Payer: Medicare Other | Attending: Internal Medicine | Admitting: Internal Medicine

## 2013-04-29 ENCOUNTER — Encounter (HOSPITAL_COMMUNITY): Payer: Self-pay | Admitting: Emergency Medicine

## 2013-04-29 DIAGNOSIS — Z8249 Family history of ischemic heart disease and other diseases of the circulatory system: Secondary | ICD-10-CM

## 2013-04-29 DIAGNOSIS — E870 Hyperosmolality and hypernatremia: Secondary | ICD-10-CM

## 2013-04-29 DIAGNOSIS — N390 Urinary tract infection, site not specified: Secondary | ICD-10-CM | POA: Diagnosis present

## 2013-04-29 DIAGNOSIS — E119 Type 2 diabetes mellitus without complications: Secondary | ICD-10-CM | POA: Diagnosis present

## 2013-04-29 DIAGNOSIS — J449 Chronic obstructive pulmonary disease, unspecified: Secondary | ICD-10-CM | POA: Diagnosis present

## 2013-04-29 DIAGNOSIS — I6529 Occlusion and stenosis of unspecified carotid artery: Secondary | ICD-10-CM | POA: Diagnosis present

## 2013-04-29 DIAGNOSIS — Z931 Gastrostomy status: Secondary | ICD-10-CM

## 2013-04-29 DIAGNOSIS — N19 Unspecified kidney failure: Secondary | ICD-10-CM

## 2013-04-29 DIAGNOSIS — R5381 Other malaise: Secondary | ICD-10-CM | POA: Diagnosis present

## 2013-04-29 DIAGNOSIS — I252 Old myocardial infarction: Secondary | ICD-10-CM

## 2013-04-29 DIAGNOSIS — R1310 Dysphagia, unspecified: Secondary | ICD-10-CM

## 2013-04-29 DIAGNOSIS — E860 Dehydration: Secondary | ICD-10-CM

## 2013-04-29 DIAGNOSIS — I10 Essential (primary) hypertension: Secondary | ICD-10-CM | POA: Diagnosis present

## 2013-04-29 DIAGNOSIS — G9340 Encephalopathy, unspecified: Secondary | ICD-10-CM

## 2013-04-29 DIAGNOSIS — Z833 Family history of diabetes mellitus: Secondary | ICD-10-CM

## 2013-04-29 DIAGNOSIS — I639 Cerebral infarction, unspecified: Secondary | ICD-10-CM

## 2013-04-29 DIAGNOSIS — R627 Adult failure to thrive: Secondary | ICD-10-CM

## 2013-04-29 DIAGNOSIS — I69991 Dysphagia following unspecified cerebrovascular disease: Secondary | ICD-10-CM

## 2013-04-29 DIAGNOSIS — Z823 Family history of stroke: Secondary | ICD-10-CM

## 2013-04-29 DIAGNOSIS — R3989 Other symptoms and signs involving the genitourinary system: Secondary | ICD-10-CM | POA: Diagnosis present

## 2013-04-29 DIAGNOSIS — Z87440 Personal history of urinary (tract) infections: Secondary | ICD-10-CM

## 2013-04-29 DIAGNOSIS — F0390 Unspecified dementia without behavioral disturbance: Secondary | ICD-10-CM | POA: Diagnosis present

## 2013-04-29 DIAGNOSIS — I5020 Unspecified systolic (congestive) heart failure: Secondary | ICD-10-CM | POA: Diagnosis present

## 2013-04-29 DIAGNOSIS — Z681 Body mass index (BMI) 19 or less, adult: Secondary | ICD-10-CM

## 2013-04-29 DIAGNOSIS — J4489 Other specified chronic obstructive pulmonary disease: Secondary | ICD-10-CM | POA: Diagnosis present

## 2013-04-29 DIAGNOSIS — I509 Heart failure, unspecified: Secondary | ICD-10-CM | POA: Diagnosis present

## 2013-04-29 DIAGNOSIS — E43 Unspecified severe protein-calorie malnutrition: Secondary | ICD-10-CM | POA: Insufficient documentation

## 2013-04-29 DIAGNOSIS — R531 Weakness: Secondary | ICD-10-CM

## 2013-04-29 DIAGNOSIS — I428 Other cardiomyopathies: Secondary | ICD-10-CM | POA: Diagnosis present

## 2013-04-29 DIAGNOSIS — E876 Hypokalemia: Secondary | ICD-10-CM | POA: Diagnosis present

## 2013-04-29 DIAGNOSIS — E785 Hyperlipidemia, unspecified: Secondary | ICD-10-CM | POA: Diagnosis present

## 2013-04-29 DIAGNOSIS — I69920 Aphasia following unspecified cerebrovascular disease: Secondary | ICD-10-CM

## 2013-04-29 DIAGNOSIS — Z87891 Personal history of nicotine dependence: Secondary | ICD-10-CM

## 2013-04-29 DIAGNOSIS — N179 Acute kidney failure, unspecified: Secondary | ICD-10-CM

## 2013-04-29 LAB — CBC WITH DIFFERENTIAL/PLATELET
Basophils Absolute: 0 10*3/uL (ref 0.0–0.1)
Basophils Relative: 0 % (ref 0–1)
Eosinophils Absolute: 0.1 10*3/uL (ref 0.0–0.7)
Eosinophils Relative: 1 % (ref 0–5)
Lymphocytes Relative: 23 % (ref 12–46)
MCH: 30 pg (ref 26.0–34.0)
MCHC: 32 g/dL (ref 30.0–36.0)
MCV: 93.7 fL (ref 78.0–100.0)
Platelets: 304 10*3/uL (ref 150–400)
RDW: 14.1 % (ref 11.5–15.5)
WBC: 12.6 10*3/uL — ABNORMAL HIGH (ref 4.0–10.5)

## 2013-04-29 LAB — COMPREHENSIVE METABOLIC PANEL
ALT: 17 U/L (ref 0–35)
AST: 16 U/L (ref 0–37)
Albumin: 4.1 g/dL (ref 3.5–5.2)
CO2: 26 mEq/L (ref 19–32)
Calcium: 10.7 mg/dL — ABNORMAL HIGH (ref 8.4–10.5)
Creatinine, Ser: 1.35 mg/dL — ABNORMAL HIGH (ref 0.50–1.10)
Potassium: 3.9 mEq/L (ref 3.5–5.1)
Sodium: 161 mEq/L (ref 135–145)
Total Protein: 8.6 g/dL — ABNORMAL HIGH (ref 6.0–8.3)

## 2013-04-29 LAB — URINALYSIS, ROUTINE W REFLEX MICROSCOPIC
Glucose, UA: NEGATIVE mg/dL
Hgb urine dipstick: NEGATIVE
Specific Gravity, Urine: >1.03 — ABNORMAL HIGH (ref 1.005–1.030)
pH: 5.5 (ref 5.0–8.0)

## 2013-04-29 LAB — GLUCOSE, CAPILLARY

## 2013-04-29 MED ORDER — SODIUM CHLORIDE 0.9 % IV SOLN: 1000.0000 mL | INTRAVENOUS | Status: DC

## 2013-04-29 MED ORDER — GLUCERNA SHAKE PO LIQD
237.0000 mL | ORAL | Status: DC
  Filled 2013-04-29: qty 237

## 2013-04-29 MED ORDER — ALBUTEROL SULFATE HFA 108 (90 BASE) MCG/ACT IN AERS
2.0000 | INHALATION_SPRAY | Freq: Four times a day (QID) | RESPIRATORY_TRACT | Status: DC | PRN
  Administered 2013-04-29: 2 via RESPIRATORY_TRACT
  Filled 2013-04-29: qty 6.7

## 2013-04-29 MED ORDER — SODIUM CHLORIDE 0.9 % IJ SOLN
3.0000 mL | Freq: Two times a day (BID) | INTRAMUSCULAR | Status: DC
  Administered 2013-05-01 – 2013-05-08 (×8): 3 mL via INTRAVENOUS

## 2013-04-29 MED ORDER — INSULIN ASPART 100 UNIT/ML ~~LOC~~ SOLN
0.0000 [IU] | Freq: Three times a day (TID) | SUBCUTANEOUS | Status: DC
  Administered 2013-04-30: 1 [IU] via SUBCUTANEOUS

## 2013-04-29 MED ORDER — HEPARIN SODIUM (PORCINE) 5000 UNIT/ML IJ SOLN
5000.0000 [IU] | Freq: Three times a day (TID) | INTRAMUSCULAR | Status: DC
  Administered 2013-04-29 – 2013-05-04 (×14): 5000 [IU] via SUBCUTANEOUS
  Filled 2013-04-29 (×16): qty 1

## 2013-04-29 MED ORDER — SODIUM CHLORIDE 0.9 % IV SOLN
INTRAVENOUS | Status: DC
  Administered 2013-04-30: 06:00:00 via INTRAVENOUS

## 2013-04-29 MED ORDER — GLUCERNA PO LIQD: 237.0000 mL | Freq: Every day | ORAL | Status: DC

## 2013-04-29 MED ORDER — ATORVASTATIN CALCIUM 20 MG PO TABS
20.0000 mg | ORAL_TABLET | Freq: Every day | ORAL | Status: DC
  Filled 2013-04-29 (×2): qty 1

## 2013-04-29 MED ORDER — SODIUM CHLORIDE 0.9 % IV SOLN
1000.0000 mL | Freq: Once | INTRAVENOUS | Status: AC
  Administered 2013-04-29: 1000 mL via INTRAVENOUS

## 2013-04-29 MED ORDER — ASPIRIN EC 81 MG PO TBEC
81.0000 mg | DELAYED_RELEASE_TABLET | Freq: Every day | ORAL | Status: DC
  Filled 2013-04-29 (×3): qty 1

## 2013-04-29 MED ORDER — ALBUTEROL SULFATE (5 MG/ML) 0.5% IN NEBU: 2.5000 mg | INHALATION_SOLUTION | Freq: Four times a day (QID) | RESPIRATORY_TRACT | Status: DC | PRN

## 2013-04-29 MED ORDER — CLOPIDOGREL BISULFATE 75 MG PO TABS
75.0000 mg | ORAL_TABLET | Freq: Every day | ORAL | Status: DC
  Filled 2013-04-29: qty 1

## 2013-04-29 NOTE — Progress Notes (Signed)
Started PT in ER on MDI with albuterol with spacer before arriving to 3rd floor. Pt did poorly on inhaling and had a hard time closing her mouth. She did try very hard and her effort was great but do to her illness and past I doubt MDI will be very effective. I am changing her to nebulizer with albuterol 2.5mg . This should cover her needs and most likely would be better for home use also.

## 2013-04-29 NOTE — ED Notes (Signed)
Niece is on of caregiver, at the bedside, states home suctions should be delivered today. Feels if they had that pt would be able to continue honey thick liquids. Family wants to try this before getting a feeding tube. Family brought pt in for dehydration. Pt has not been able to swallow or take medications in last three days.

## 2013-04-29 NOTE — ED Notes (Signed)
Pt comes from home via EMS with c/o dehydration, congestion, and generalized weakness. Pt has hx of 3 strokes according to family and has been doing rehabilitation from home. Family states pt has not had any intake in past 3 days. Pt has also not been suctioned since her hospital discharge on 10/7. Pt has chest congestion and is unable to clear sputum via cough. Family states mental status is at baseline "she just seems more weak than usual".

## 2013-04-29 NOTE — H&P (Signed)
Hospitalist Admission History and Physical  Patient name: Christine Durham Medical record number: [id number] Date of birth: 1941-01-19 Age: 72 y.o. Gender: female  Primary Care Provider: Ignatius Specking., MD  Chief Complaint: dehydration History of Present Illness:This is a 72 y.o. 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 Level V caveat as patient is predominantly nonverbal secondary to cerebrovascular disease. Patient's niece is at the bedside. 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 Followed by Dr. Durwin Nora with vascular surgery. The patient was discharged with medical management as well as a dysphagia 3 diet. Per the niece, patient has had progressive difficulty in swallowing foods. At home suctioning was ordered to help with retained food in the airway. However, this has not yet been received. Knees feels that this will help tremendously with patient feeding as patient has used this in a nursing home previously. However, patient has been unable to eat or drink over the past 4-5 days because of severe old debris. Niece denies any fevers or chills. No episodes of vomiting. No recent falls. No worsening right-sided hemiparesis. No worsening left-sided residual deficit. Dysarthria has persisted since hospital discharge. Patient presents today in the ER with noticed sodium of 161, creatinine 1.35 (baseline 0.6), BUN of 44, calcium 10.7. A chest x-ray was also obtained that showed bibasilar atelectasis without any focal infiltrate. Patient also had a white count of  12.6. Afebrile on presentation. Systolic pressure in the 140s. Satting 99% on room air without any respiratory distress or cough.  Patient Active Problem List   Diagnosis Date Noted  . possible Aspiration pneumonia 04/13/2013  . 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 Acute ischemic stroke 03/22/2013  . Dysphagia 03/22/2013  . UTI (urinary tract infection) 03/22/2013  . COPD (chronic obstructive pulmonary disease) 07/04/2012  . Physical deconditioning 07/04/2012  . CVA (cerebral infarction) 07/04/2012  . Asthma   . Tobacco abuse 07/01/2012  . Acute respiratory failure 06/28/2012  . NICM (nonischemic cardiomyopathy) 06/28/2012  . Hypotension 06/28/2012  . Non-ST elevation myocardial infarction (NSTEMI), initial care episode - Type 2 secondary to respiratory issues 06/28/2012  . Diabetes mellitus without complication   . Acute on chronic systolic CHF (congestive heart failure) 06/27/2012   Past Medical History: Past Medical History  Diagnosis Date  . 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 Diabetes mellitus without complication   . Asthma   . HTN (hypertension)   . Tobacco abuse   . 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 COPD (chronic obstructive pulmonary disease)     Past Surgical History: Past Surgical History  Procedure Laterality Date  . Appendectomy      Social History: History   Social History  . Marital Status: Single    Spouse Name: N/A    Number of Children: N/A  . Years of Education: N/A   Social History Main Topics  . Smoking status: Former Smoker    Quit date: 06/21/2010  . Smokeless tobacco: None  . Alcohol Use: No  . Drug Use: No  . Sexual Activity: None   Other Topics  Concern  . None   Social History Narrative  . None    Family History: Family History  Problem Relation Age of Onset  . Diabetes Sister   . Hyperlipidemia Sister   . Hypertension Sister     Allergies: No  Known Allergies  Current Facility-Administered Medications  Medication Dose Route Frequency Provider Last Rate Last Dose  . 0.9 %  sodium chloride infusion  1,000 mL Intravenous Continuous Lyanne Co, MD      . 0.9 %  sodium chloride infusion   Intravenous Continuous Doree Albee, MD      . albuterol (PROVENTIL HFA;VENTOLIN HFA) 108 (90 BASE) MCG/ACT inhaler 2 puff  2 puff Inhalation Q6H PRN Doree Albee, MD      . aspirin EC tablet 81 mg  81 mg Oral Daily Doree Albee, MD      . atorvastatin (LIPITOR) tablet 20 mg  20 mg Oral QHS Doree Albee, MD      . Melene Muller ON 04/30/2013] clopidogrel (PLAVIX) tablet 75 mg  75 mg Oral Q breakfast Doree Albee, MD      . GLUCERNA (GLUCERNA) liquid 237 mL  237 mL Oral Daily Doree Albee, MD      . heparin injection 5,000 Units  5,000 Units Subcutaneous Q8H Doree Albee, MD      . Melene Muller ON 04/30/2013] insulin aspart (novoLOG) injection 0-9 Units  0-9 Units Subcutaneous TID WC Doree Albee, MD      . sodium chloride 0.9 % injection 3 mL  3 mL Intravenous Q12H Doree Albee, MD       Current Outpatient Prescriptions  Medication Sig Dispense Refill  . albuterol (PROVENTIL HFA;VENTOLIN HFA) 108 (90 BASE) MCG/ACT inhaler Inhale 2 puffs into the lungs every 6 (six) hours as needed for wheezing.  1 Inhaler  2  . amLODipine (NORVASC) 5 MG tablet Take 2 tablets (10 mg total) by mouth daily.  60 tablet  1  . aspirin EC 81 MG tablet Take 1 tablet (81 mg total) by mouth daily.      Marland Kitchen atorvastatin (LIPITOR) 10 MG tablet Take 2 tablets (20 mg total) by mouth at bedtime.      . clopidogrel (PLAVIX) 75 MG tablet Take 1 tablet (75 mg total) by mouth daily with breakfast.      . GLUCERNA (GLUCERNA) LIQD Take 237 mLs by mouth daily.      . metFORMIN (GLUCOPHAGE) 500 MG tablet Take 1 tablet (500 mg total) by mouth 2 (two) times daily with a meal.  60 tablet  1   Review Of Systems: 12 point ROS negative except as noted above in HPI.  Physical Exam: Filed Vitals:    04/29/13 1800  BP: 158/85  Pulse:   Temp:   Resp: 17    General: frail, cachectic, nonverbal but nods to some questioning. HEENT: PERRLA, extra ocular movement intact and dry oral mucosa Heart: S1, S2 normal, no murmur, rub or gallop, regular rate and rhythm Lungs: decreased overall resp effort, no focal abnormalities auscultated  Abdomen: abdomen is soft without significant tenderness, masses, organomegaly or guarding Extremities: extremities normal, atraumatic, no cyanosis or edema Skin:no rashes, no ecchymoses Neurology: nonverbal, baseline hemparesis bilaterally R>L  Labs and Imaging: Lab Results  Component Value Date/Time   NA 161* 04/29/2013  4:47 PM   K 3.9 04/29/2013  4:47 PM   CL 119* 04/29/2013  4:47 PM   CO2 26 04/29/2013  4:47 PM   BUN 44* 04/29/2013  4:47 PM  CREATININE 1.35* 04/29/2013  4:47 PM   GLUCOSE 174* 04/29/2013  4:47 PM   Lab Results  Component Value Date   WBC 12.6* 04/29/2013   HGB 13.7 04/29/2013   HCT 42.8 04/29/2013   MCV 93.7 04/29/2013   PLT 304 04/29/2013   Urinalysis    Component Value Date/Time   COLORURINE YELLOW 04/29/2013 1810   APPEARANCEUR CLEAR 04/29/2013 1810   LABSPEC >1.030* 04/29/2013 1810   PHURINE 5.5 04/29/2013 1810   GLUCOSEU NEGATIVE 04/29/2013 1810   HGBUR NEGATIVE 04/29/2013 1810   BILIRUBINUR MODERATE* 04/29/2013 1810   KETONESUR TRACE* 04/29/2013 1810   PROTEINUR NEGATIVE 04/29/2013 1810   UROBILINOGEN 0.2 04/29/2013 1810   NITRITE NEGATIVE 04/29/2013 1810   LEUKOCYTESUR NEGATIVE 04/29/2013 1810       Dg Chest 2 View  04/29/2013   CLINICAL DATA:  Congestion. Short of breath.  EXAM: CHEST  2 VIEW  COMPARISON:  04/15/2013  FINDINGS: Low lung volumes. Upper normal heart size. Upper lungs clear. No pneumothorax. Stable T12 compression deformity.  IMPRESSION: Bibasilar atelectasis.   Electronically Signed   By: Maryclare Bean M.D.   On: 04/29/2013 17:28     Assessment and Plan: JACQUELEEN PULVER is a 72 y.o. year  old female presenting with dehydration.  Dehydration: Slightly secondary to progressive dysphasia. Begin gentle hydration with normal saline in the setting of systolic CHF. Markedly dry on exam. Restart dysphagia 3 diet with aggressive suctioning pending speech pathology consult. Broach issue of possible PEG/feeding tube placement to help with this issue. Niece states her family is willing to discuss this.  Cerebrovascular disease: Significant baseline deficits. No acute worsening per niece. Noted left internal carotid occlusion and still present(patient/family refused IR treatment at last visit). Continue aspirin Plavix. Maybe discussion about long-term management with family.  AK I.: Likely prerenal etiology. BUN creatinine ratio greater than 20:1. Gently rehydrate and reassess. Avoid NSAIDs.   FEN/GI: Pending bedside swallow eval. Dysphagia 3 diet with frequent suctioning pending clearance. Speech pathology consult.   Hypernatremia. Likely secondary to dehydration. Check urine osmolality as well as urine sodium to correlate. Gentle hydration with normal saline. Reassess in the morning.  Hypercalcemia. Also likely secondary to dehydration. We'll check vitamin D, ionized calcium, intact PTH to correlate. Gentle rehydration and reassess.  Diabetes: Sliding scale insulin.  Hyperlipidemia: Continue statin.  Prophylaxis: Subcutaneous heparin Disposition: Pending further evaluation Code Status: Full code       Doree Albee MD  Pager: [phone]

## 2013-04-29 NOTE — Progress Notes (Signed)
Attempted swallow screen on patient.  Patient was given tiny bit of water to swallow and she immediately began coughing.  Will keep patient npo for now.  Will reassess at a later time.

## 2013-04-29 NOTE — ED Provider Notes (Signed)
CSN: [id number]     Arrival date & time 04/29/13  1614 History  This chart was scribed for Lyanne Co, MD by Ronal Fear, ED Scribe. This patient was seen in room APA11/APA11 and the patient's care was started at 4:32 PM.     Chief Complaint  Patient presents with  . Dehydration    History limited by: level 5 caveat; dementia.    72 y.o. female. [name], with some dementia, and past history of stroke, admitted to [facility] on September 10 with acute left hemispheric stroke, manifesting as dysphagia dysarthria and right hemiplegia. MRI/MRA showed stenosis of the carotid arteries. She presents today because pt has not been eating or drinking adequately for the past 3 days. Her niece noticed that her diapers were not full, and when the pt would urinate the urine would be very dark. The pt's niece feared that the pt may be suffering from dehydration from her lack of drinking and eating. Pt's niece denies nausea,vomiting, fever, and lack of speech.  Past Medical History  Diagnosis Date  . 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 Diabetes mellitus without complication   . Asthma   . HTN (hypertension)   . Tobacco abuse   . 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 COPD (chronic obstructive pulmonary disease)    Past Surgical History  Procedure Laterality Date  . Appendectomy     Family History  Problem Relation Age of Onset  . Diabetes Sister   . Hyperlipidemia Sister   . Hypertension Sister    History  Substance Use Topics  . Smoking status: Former Smoker    Quit date: 06/21/2010  . Smokeless tobacco: Not on file  . Alcohol Use: No   OB History   Grav Para Term Preterm Abortions TAB SAB Ect Mult Living                 Review of Systems  Unable to perform ROS: Dementia   A complete 10 system review of systems was obtained and all systems are negative except as noted in the HPI and PMH.    Allergies  Review of patient's allergies indicates no  known allergies.  Home Medications   Current Outpatient Rx  Name  Route  Sig  Dispense  Refill  . albuterol (PROVENTIL HFA;VENTOLIN HFA) 108 (90 BASE) MCG/ACT inhaler   Inhalation   Inhale 2 puffs into the lungs every 6 (six) hours as needed for wheezing.   1 Inhaler   2   . amLODipine (NORVASC) 5 MG tablet   Oral   Take 2 tablets (10 mg total) by mouth daily.   60 tablet   1   . aspirin EC 81 MG tablet   Oral   Take 1 tablet (81 mg total) by mouth daily.         Marland Kitchen atorvastatin (LIPITOR) 10 MG tablet   Oral   Take 2 tablets (20 mg total) by mouth at bedtime.         . clopidogrel (PLAVIX) 75 MG tablet   Oral   Take 1 tablet (75 mg total) by mouth daily with breakfast.         . GLUCERNA (GLUCERNA) LIQD   Oral   Take 237 mLs by mouth daily.         . metFORMIN (GLUCOPHAGE) 500 MG tablet   Oral   Take 1 tablet (500 mg total) by mouth  2 (two) times daily with a meal.   60 tablet   1    BP 158/85  Pulse 114  Temp(Src) 99.8 F (37.7 C) (Oral)  Resp 17  SpO2 98% Physical Exam  Nursing note and vitals reviewed. Constitutional: No distress.  Cachetic  HENT:  Head: Normocephalic and atraumatic.  Mouth/Throat: No oropharyngeal exudate.  Dry mucous membranes. Large amount of hardened mucous on hard and soft palette.   Eyes: EOM are normal.  Neck: Normal range of motion.  Cardiovascular: Normal rate, regular rhythm and normal heart sounds.   Pulmonary/Chest: Effort normal and breath sounds normal.  Abdominal: Soft. She exhibits no distension. There is no tenderness.  Musculoskeletal: Normal range of motion.  Neurological: She is alert.  Skin: Skin is warm and dry.  Psychiatric: She has a normal mood and affect. Judgment normal.    ED Course  Procedures (including critical care time) DIAGNOSTIC STUDIES: Oxygen Saturation is 98% on RA, normal by my interpretation.    COORDINATION OF CARE:    4:46 PM- Pt advised of plan for treatment swabbing the  mouth and suction of the throat and pt's niece  agrees.   Labs Review Labs Reviewed  CBC WITH DIFFERENTIAL - Abnormal; Notable for the following:    WBC 12.6 (*)    Neutro Abs 8.7 (*)    All other components within normal limits  COMPREHENSIVE METABOLIC PANEL - Abnormal; Notable for the following:    Sodium 161 (*)    Chloride 119 (*)    Glucose, Bld 174 (*)    BUN 44 (*)    Creatinine, Ser 1.35 (*)    Calcium 10.7 (*)    Total Protein 8.6 (*)    Alkaline Phosphatase 134 (*)    GFR calc non Af Amer 39 (*)    GFR calc Af Amer 45 (*)    All other components within normal limits  URINALYSIS, ROUTINE W REFLEX MICROSCOPIC - Abnormal; Notable for the following:    Specific Gravity, Urine >1.030 (*)    Bilirubin Urine MODERATE (*)    Ketones, ur TRACE (*)    All other components within normal limits   Imaging Review Dg Chest 2 View  04/29/2013   CLINICAL DATA:  Congestion. Short of breath.  EXAM: CHEST  2 VIEW  COMPARISON:  04/15/2013  FINDINGS: Low lung volumes. Upper normal heart size. Upper lungs clear. No pneumothorax. Stable T12 compression deformity.  IMPRESSION: Bibasilar atelectasis.   Electronically Signed   By: Maryclare Bean M.D.   On: 04/29/2013 17:28  I personally reviewed the imaging tests through PACS system I reviewed available ER/hospitalization records through the EMR   EKG Interpretation   None       MDM   1. Dehydration   2. Renal failure   3. Dysphagia    Patient with hypernatremia with a sodium of 161 and new acute renal failure with a BUN of 44 and creatinine 1.35.  This is likely secondary to worsening dysphagia from her prior stroke.  I do not think the patient will be on tolerate anything by mouth and I think the patient may require supplemental feeding through something like a gastrostomy tube.  This was discussed with the family and further discussions will need to be had upstairs with the inpatient team.  In the meantime the patient only to be admitted  for rehydration.  IV fluids given.  I personally performed the services described in this documentation, which was scribed in my presence.  The recorded information has been reviewed and is accurate.       Lyanne Co, MD 04/29/13 [phone]

## 2013-04-30 DIAGNOSIS — I6350 Cerebral infarction due to unspecified occlusion or stenosis of unspecified cerebral artery: Secondary | ICD-10-CM

## 2013-04-30 DIAGNOSIS — G9340 Encephalopathy, unspecified: Secondary | ICD-10-CM

## 2013-04-30 DIAGNOSIS — R627 Adult failure to thrive: Secondary | ICD-10-CM

## 2013-04-30 DIAGNOSIS — N179 Acute kidney failure, unspecified: Principal | ICD-10-CM

## 2013-04-30 DIAGNOSIS — E119 Type 2 diabetes mellitus without complications: Secondary | ICD-10-CM

## 2013-04-30 DIAGNOSIS — E870 Hyperosmolality and hypernatremia: Secondary | ICD-10-CM

## 2013-04-30 DIAGNOSIS — R531 Weakness: Secondary | ICD-10-CM

## 2013-04-30 DIAGNOSIS — R1310 Dysphagia, unspecified: Secondary | ICD-10-CM

## 2013-04-30 LAB — COMPREHENSIVE METABOLIC PANEL
ALT: 15 U/L (ref 0–35)
AST: 18 U/L (ref 0–37)
Albumin: 3.2 g/dL — ABNORMAL LOW (ref 3.5–5.2)
Alkaline Phosphatase: 109 U/L (ref 39–117)
CO2: 25 mEq/L (ref 19–32)
Chloride: 129 mEq/L — ABNORMAL HIGH (ref 96–112)
Creatinine, Ser: 0.91 mg/dL (ref 0.50–1.10)
GFR calc Af Amer: 72 mL/min — ABNORMAL LOW (ref >90)
GFR calc non Af Amer: 62 mL/min — ABNORMAL LOW (ref >90)
Potassium: 3.7 mEq/L (ref 3.5–5.1)
Total Bilirubin: 0.3 mg/dL (ref 0.3–1.2)

## 2013-04-30 LAB — BASIC METABOLIC PANEL
BUN: 27 mg/dL — ABNORMAL HIGH (ref 6–23)
CO2: 29 mEq/L (ref 19–32)
Calcium: 10 mg/dL (ref 8.4–10.5)
Creatinine, Ser: 0.96 mg/dL (ref 0.50–1.10)
GFR calc Af Amer: 68 mL/min — ABNORMAL LOW (ref >90)
GFR calc Af Amer: 71 mL/min — ABNORMAL LOW (ref >90)
GFR calc non Af Amer: 59 mL/min — ABNORMAL LOW (ref >90)
GFR calc non Af Amer: 62 mL/min — ABNORMAL LOW (ref >90)
Glucose, Bld: 162 mg/dL — ABNORMAL HIGH (ref 70–99)
Glucose, Bld: 152 mg/dL — ABNORMAL HIGH (ref 70–99)
Potassium: 3.9 mEq/L (ref 3.5–5.1)
Sodium: 161 mEq/L (ref 135–145)

## 2013-04-30 LAB — PREALBUMIN: Prealbumin: 19.5 mg/dL (ref 17.0–34.0)

## 2013-04-30 LAB — GLUCOSE, CAPILLARY
Glucose-Capillary: 129 mg/dL — ABNORMAL HIGH (ref 70–99)
Glucose-Capillary: 139 mg/dL — ABNORMAL HIGH (ref 70–99)
Glucose-Capillary: 125 mg/dL — ABNORMAL HIGH (ref 70–99)

## 2013-04-30 LAB — MRSA PCR SCREENING: MRSA by PCR: NEGATIVE

## 2013-04-30 MED ORDER — DEXTROSE 5 % IV SOLN
INTRAVENOUS | Status: DC
  Administered 2013-04-30: 09:00:00 via INTRAVENOUS
  Administered 2013-05-01: 1000 mL via INTRAVENOUS

## 2013-04-30 MED ORDER — BIOTENE DRY MOUTH MT LIQD
15.0000 mL | Freq: Two times a day (BID) | OROMUCOSAL | Status: DC
  Administered 2013-04-30 – 2013-05-02 (×5): 15 mL via OROMUCOSAL

## 2013-04-30 MED ORDER — INSULIN ASPART 100 UNIT/ML ~~LOC~~ SOLN
0.0000 [IU] | Freq: Four times a day (QID) | SUBCUTANEOUS | Status: DC
  Administered 2013-04-30: 1 [IU] via SUBCUTANEOUS
  Administered 2013-05-01 – 2013-05-02 (×4): 2 [IU] via SUBCUTANEOUS
  Administered 2013-05-02: 5 [IU] via SUBCUTANEOUS

## 2013-04-30 NOTE — Progress Notes (Signed)
[facility] PROGRESS NOTE  Christine Durham:[id number] DOB: 20-Aug-1940 DOA: 04/29/2013 PCP: Ignatius Specking., MD  Assessment/Plan: 1. Dysphagia: Failed swallow screen. Likely multifactorial, secondary to multiple strokes as well as difficulty clearing secretions. Remain n.p.o. Will allow crush meds with applesauce. Speech therapy evaluation 10/20. No new focal deficits, doubt extension of stroke. 2. Acute encephalopathy: Suspect metabolic in nature secondary to electrolyte disturbance. No evidence of infection. 3. History of ischemic stroke 03/2013 with expressive aphasia. History of extension of stroke 04/2013 with dysphagia, dysarthria, right hemiplegia and aspiration pneumonia. Left hemispheric watershed  distribution. Continues on aspirin, Plavix. No evidence of progression, no new neurologic dysfunction. 4. Severe hypernatremia, dehydration, acute renal failure: Secondary to poor oral intake, failure to thrive. Renal failure nearly resolved. 5. Generalized weakness: Physical therapy evaluation. 6. Failure to thrive: Likely secondary to dysphagia, recent strokes. 7. Chest congestion by history: No evidence of pneumonia. Monitor clinically. 8. Diabetes mellitus: Appears stable. Continue sliding scale insulin. Resume metformin on discharge. 9. COPD: Appears stable. Continue nebulizer therapy. 10. Dementia: Stable. Resume Aricept when able.  Discussed in detail with niece at bedside who provided further history as outlined below. I reviewed all laboratory studies and testing thus far as well as current diagnoses, treatment plan.   medications crushed if necessary   Change to D5W, serial BMP, monitor sodium  Speech therapy evaluation tomorrow  Suction as needed  Aspiration precautions  Pending studies:     Code Status: Full code DVT prophylaxis: Heparin  Family Communication:  Disposition Plan: Return home with home health  Brendia Sacks, MD  [facility]  Pager  [phone] If 7PM-7AM, please contact night-coverage at [email or web address], password [facility] 04/30/2013, 9:39 AM  LOS: 1 day   Summary: 72 year old woman with history of 2 strokes within the last 60 days, occlusion of the left carotid artery, severe dysphagia presented with dehydration, poor oral intake the last several days. Family noted progressive difficulty swallowing foods. No suction at home. Patient has been unable 8 secondary to secretions. No new focal deficits noted except for dysphagia.  Consultants:  Speech therapy  Procedures:    HPI/Subjective: More lethargic today per her niece. Patient offers no history. He is reports that the patient did relatively well at home although progression of dysphagia was noted. She feels that because of difficulty swallowing and no suction patient developed worsening dysphagia and then failure to thrive. She has observed no new neurologic problems.  Objective: Filed Vitals:   04/29/13 2009 04/29/13 2209 04/30/13 0500 04/30/13 0615  BP: 140/62 140/66  122/70  Pulse: 108 85  97  Temp: 97.4 F (36.3 C) 97.6 F (36.4 C)  97.7 F (36.5 C)  TempSrc: Oral Oral  Oral  Resp: 24 24  22   Weight: 52 kg (114 lb 10.2 oz)  54.5 kg (120 lb 2.4 oz)   SpO2: 97% 98%  97%   No intake or output data in the 24 hours ending 04/30/13 0939   Filed Weights   04/29/13 2009 04/30/13 0500  Weight: 52 kg (114 lb 10.2 oz) 54.5 kg (120 lb 2.4 oz)    Exam:   Afebrile, vital signs stable  General: Appears calm, comfortable, lethargic. Eyes are open, follows some commands but is not speak or attempt or respond verbally. Appears chronically ill, malnourished, nontoxic. Significant hair loss in.  ENT: Dry mucous membranes, some secretions noted.  Cardiovascular: Regular rate and rhythm. No murmur, rub, gallop. No lower extremity edema.  Telemetry: Sinus rhythm  Respiratory: Clear to  auscultation bilaterally. No wheezes, rales or rhonchi. Normal respiratory  effort.  Abdomen: Soft, nontender, nondistended.  Psychiatric: Cannot assess.  Neurologic: Appears grossly nonfocal but exam is very limited by patient's condition.  Data Reviewed:  Capillary blood sugars stable.  Creatinine 1.35 >> 0.91. BUN 44 >> 31  Sodium 161 >> 163  Hepatic function unremarkable white blood cell, grossly elevated on admission. Normal hemoglobin.  Urinalysis negative  Hemoglobin A1c 8.0 03/2013   Chest x-ray bibasilar atelectasis  Scheduled Meds: . antiseptic oral rinse  15 mL Mouth Rinse BID  . aspirin EC  81 mg Oral Daily  . atorvastatin  20 mg Oral QHS  . clopidogrel  75 mg Oral Q breakfast  . feeding supplement (GLUCERNA SHAKE)  237 mL Oral Q24H  . heparin  5,000 Units Subcutaneous Q8H  . insulin aspart  0-9 Units Subcutaneous TID WC  . sodium chloride  3 mL Intravenous Q12H   Continuous Infusions: . sodium chloride 75 mL/hr at 04/30/13 0621  . dextrose 50 mL/hr at 04/30/13 2956    Principal Problem:   Hypernatremia Active Problems:   Diabetes mellitus without complication   Dysphagia   Acute encephalopathy   Acute renal failure   Generalized weakness   Adult failure to thrive   Time spent 25 minutes

## 2013-04-30 NOTE — Progress Notes (Signed)
Rt checked on PT, she had clear decreased breath sounds, with a very congested cough. May need suctioning in future if she cannot clear secretions. Sat 96 on 3lpm.

## 2013-05-01 DIAGNOSIS — E43 Unspecified severe protein-calorie malnutrition: Secondary | ICD-10-CM | POA: Insufficient documentation

## 2013-05-01 LAB — BASIC METABOLIC PANEL
BUN: 19 mg/dL (ref 6–23)
CO2: 26 mEq/L (ref 19–32)
CO2: 30 mEq/L (ref 19–32)
Calcium: 9.7 mg/dL (ref 8.4–10.5)
Creatinine, Ser: 0.81 mg/dL (ref 0.50–1.10)
Creatinine, Ser: 0.79 mg/dL (ref 0.50–1.10)
GFR calc Af Amer: 83 mL/min — ABNORMAL LOW (ref >90)
GFR calc non Af Amer: 72 mL/min — ABNORMAL LOW (ref >90)
GFR calc non Af Amer: 82 mL/min — ABNORMAL LOW (ref >90)
Glucose, Bld: 278 mg/dL — ABNORMAL HIGH (ref 70–99)
Potassium: 3.5 mEq/L (ref 3.5–5.1)
Sodium: 161 mEq/L (ref 135–145)

## 2013-05-01 LAB — GLUCOSE, CAPILLARY: Glucose-Capillary: 162 mg/dL — ABNORMAL HIGH (ref 70–99)

## 2013-05-01 LAB — SODIUM, URINE, RANDOM: Sodium, Ur: 68 mEq/L

## 2013-05-01 LAB — VITAMIN D 25 HYDROXY (VIT D DEFICIENCY, FRACTURES): Vit D, 25-Hydroxy: 20 ng/mL — ABNORMAL LOW (ref 30–89)

## 2013-05-01 LAB — PTH, INTACT AND CALCIUM
Calcium, Total (PTH): 9.5 mg/dL (ref 8.4–10.5)
PTH: 77.5 pg/mL — ABNORMAL HIGH (ref 14.0–72.0)

## 2013-05-01 MED ORDER — ASPIRIN 300 MG RE SUPP
300.0000 mg | Freq: Every day | RECTAL | Status: DC
  Administered 2013-05-01 – 2013-05-04 (×4): 300 mg via RECTAL
  Filled 2013-05-01 (×8): qty 1

## 2013-05-01 MED ORDER — POTASSIUM CHLORIDE 2 MEQ/ML IV SOLN
INTRAVENOUS | Status: DC
  Filled 2013-05-01 (×2): qty 1000

## 2013-05-01 MED ORDER — DEXTROSE 5 % IV BOLUS
1000.0000 mL | Freq: Once | INTRAVENOUS | Status: AC
  Administered 2013-05-01: 1000 mL via INTRAVENOUS

## 2013-05-01 MED ORDER — POTASSIUM CHLORIDE 10 MEQ/100ML IV SOLN
10.0000 meq | INTRAVENOUS | Status: AC
  Administered 2013-05-01 (×4): 10 meq via INTRAVENOUS
  Filled 2013-05-01 (×5): qty 100

## 2013-05-01 NOTE — Evaluation (Signed)
Clinical/Bedside Swallow Evaluation  Patient Details  Name: Christine Durham MRN: [id number] Date of Birth: 05-05-1941  Today's Date: 05/01/2013 Time: 9528-4132 SLP Time Calculation (min): 34 min  Past Medical History:  Past Medical History  Diagnosis Date  . 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 Diabetes mellitus without complication   . Asthma   . HTN (hypertension)   . Tobacco abuse   . 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 COPD (chronic obstructive pulmonary disease)    Past Surgical History:  Past Surgical History  Procedure Laterality Date  . Appendectomy     HPI:  72 y.o. female. known to SLP from previous admissions. Past history of stroke, admitted to [facility] on September 10 with acute left hemispheric stroke, manifesting as dysphagia dysarthria and right hemiplegia. MRI/MRA showed stenosis of the carotid arteries. She was discharged to skilled nursing facility on a pured diet for rehabilitation. She was readmitted to [facility] in early October because of shortness of breath, worsening cough and inability to clear her secretions. She was discharged home a couple of weeks ago on a pureed diet with honey thick liquids where she tolerated diet well for a few days, but demonstrated decreased intake last week and suspected worsening swallow function. Pt was readmitted with dehydration and has not really spoken to family since admission (previously was able to follow simple commands and communicate with assist). Family feels strongly that she needs oral suction at home due to thick, ropey secretions in oropharynx.     Assessment / Plan / Recommendation Clinical Impression  Functionally severe dysphagia in setting of dehydration and general decline from previous strokes. Pt known to me from previous admissions. She appears alert with eyes open and tracks clinician, but unable to follow commands or demonstrate adequate po readiness skills (no lip closure or lingual movement to  ice chips or sorbet presentations). Oral care completed and SLP removed gummy, ropey secretions along soft palate and provided oral suctioning. A couple of spontaneous swalllows were noted, however pt does so with mouth open and reduced hyolaryngeal excursion suspected. Pt is inappropriate for further po trials in this state. SLP spoke with family at length this AM and afternoon. SLP will see pt tomorrow AM for any changes in alertness. Adequate oral intake will likely be an ongoing problem  with [name].    Aspiration Risk  Severe    Diet Recommendation NPO        Other  Recommendations Oral Care Recommendations: Oral care Q4 per protocol;Staff/trained caregiver to provide oral care   Follow Up Recommendations  Skilled Nursing facility    Frequency and Duration min 3x week  1 week       Swallow Study Prior Functional Status   Limited oral intake at home with puree and honey-thick liquids.    General Date of Onset: 04/28/13 HPI: 72 y.o. female. known to SLP from previous admissions. Past history of stroke, admitted to [facility] on September 10 with acute left hemispheric stroke, manifesting as dysphagia dysarthria and right hemiplegia. MRI/MRA showed stenosis of the carotid arteries. She was discharged to skilled nursing facility on a pured diet for rehabilitation. She was readmitted to [facility] in early October because of shortness of breath, worsening cough and inability to clear her secretions. She was discharged home a couple of weeks ago on a pureed diet with honey thick liquids where she tolerated diet well for a few days, but demonstrated decreased  intake last week and suspected worsening swallow function. Pt was readmitted with dehydration and has not really spoken to family since admission (previously was able to follow simple commands and communicate with assist). Family feels strongly that she needs oral suction at home due to thick, ropey secretions in oropharynx.   Type of Study:  Bedside swallow evaluation Previous Swallow Assessment: MBSS 03/24/13 Diet Prior to this Study: NPO Temperature Spikes Noted: No Respiratory Status: Nasal cannula History of Recent Intubation: No Behavior/Cognition: Requires cueing;Decreased sustained attention;Lethargic Oral Cavity - Dentition: Poor condition Self-Feeding Abilities: Total assist Patient Positioning: Upright in bed Baseline Vocal Quality: Aphonic Volitional Cough: Cognitively unable to elicit Volitional Swallow: Unable to elicit    Oral/Motor/Sensory Function Overall Oral Motor/Sensory Function: Impaired Labial ROM: Within Functional Limits Labial Symmetry: Within Functional Limits Labial Strength: Reduced Lingual Strength: Reduced   Ice Chips Ice chips: Impaired Presentation: Spoon Oral Phase Impairments: Reduced labial seal;Reduced lingual movement/coordination;Impaired anterior to posterior transit;Poor awareness of bolus Oral Phase Functional Implications: Oral holding   Thin Liquid Thin Liquid: Not tested    Nectar Thick Nectar Thick Liquid: Impaired Presentation: Spoon Oral Phase Impairments: Reduced labial seal;Reduced lingual movement/coordination;Poor awareness of bolus Other Comments: attempted trials sorbet- poor po readiness skills, decreased alertness   Honey Thick Honey Thick Liquid: Not tested   Puree Puree: Not tested   Solid   GO    Solid: Not tested      Thank you,  Havery Moros, CCC-SLP [phone]  Jennell Janosik 05/01/2013,3:17 PM

## 2013-05-01 NOTE — Progress Notes (Signed)
Pts family had some concerns about her mental status.  I discussed with her that potentially her sodium level being increased could be some of the cause.  I notified Dr. Janee Morn of the patients family concerns and he says once he sees the patient he will talk with them.  The patient appears to be stable at this time.

## 2013-05-01 NOTE — Progress Notes (Signed)
Notifed pa on call of patient's sodium level.  It was decreased from earlier lab draws. No new orders received at this time.

## 2013-05-01 NOTE — Progress Notes (Addendum)
INITIAL NUTRITION ASSESSMENT  DOCUMENTATION CODES Per approved criteria  -Severe malnutrition in the context of chronic illness -Underweight   Pt meets criteria for severe MALNUTRITION in the context of chronic as evidenced by unintentional wt loss of 17#, 13% < 30 days and </=75% energy intake >/= 1 month.  INTERVENTION: Will follow nutrition care, ST eval and recommendations and provide additional recommendations as care plan progresses (if PEG tube becomes an option).  NUTRITION DIAGNOSIS: Inadequate oral intake related to stroke, progressive dysphagia as evidenced by pt wt loss (17#,13%) diet hx per niece, obeserved intake, and pt dehydrated on admission.   Goal: Pt to meet >/= 90% of their estimated nutrition needs   Monitor:  ST recommendations, Diet advancement (if appropriate), Po intake, labs and wt trends  Reason for Assessment: Malnutrition Screen Score= 2  72 y.o. female  Admitting Dx: Hypernatremia  ASSESSMENT: Pt family present and pt is lying in bed not responding to questions. Her eyes and mouth are open but she is not talking. Unable to participate in nutrition focused exam. She has experienced significant wt loss since her most recent stroke (September 10 th left hemispheric).She was d/c to [facility] for rehab then re-admitted to [facility] on 10/1 with increased swallow difficulty.  Family says she ate Purred foods for ~3 days after being discharged on 10/7 then stopped eating. She has experienced severe wt loss 17#, 13% < 30 days.  Bedside swallow on 10/2 per ST, MBSS 03/24/13 (Purred Nectar-thick recommended).   Patient Active Problem List   Diagnosis Date Noted  . Acute encephalopathy 04/30/2013  . Acute renal failure 04/30/2013  . Hypernatremia 04/30/2013  . Generalized weakness 04/30/2013  . Adult failure to thrive 04/30/2013  . possible Aspiration pneumonia 04/13/2013  . 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  Acute ischemic stroke 03/22/2013  . Dysphagia 03/22/2013  . UTI (urinary tract infection) 03/22/2013  . COPD (chronic obstructive pulmonary disease) 07/04/2012  . Physical deconditioning 07/04/2012  . CVA (cerebral infarction) 07/04/2012  . Asthma   . Tobacco abuse 07/01/2012  . Acute respiratory failure 06/28/2012  . NICM (nonischemic cardiomyopathy) 06/28/2012  . Hypotension 06/28/2012  . Non-ST elevation myocardial infarction (NSTEMI), initial care episode - Type 2 secondary to respiratory issues 06/28/2012  . Diabetes mellitus without complication   . Acute on chronic systolic CHF (congestive heart failure) 06/27/2012   Height: Ht Readings from Last 1 Encounters:  04/30/13 5\' 7"  (1.702 m)    Weight: Wt Readings from Last 1 Encounters:  05/01/13 115 lb 15.4 oz (52.6 kg)    Ideal Body Weight: 135# (61.3 kg)  % Ideal Body Weight: 86%  Wt Readings from Last 10 Encounters:  05/01/13 115 lb 15.4 oz (52.6 kg)  04/12/13 123 lb 10.9 oz (56.1 kg)  04/10/13 133 lb (60.328 kg)  04/10/13 133 lb (60.328 kg)  03/29/13 128 lb (58.06 kg)  03/25/13 128 lb 14.4 oz (58.469 kg)  07/04/12 122 lb 12.7 oz (55.7 kg)  07/04/12 129 lb 8 oz (58.741 kg)  07/04/12 129 lb 8 oz (58.741 kg)    Usual Body Weight: 130-133#  % Usual Body Weight: 87%  BMI:  Body mass index is 18.16 kg/(m^2).underweight  Estimated Nutritional Needs: Kcal: 1590-1855 Protein: 70-80 gr Fluid: 1600 ml /day (normal needs)  Skin: No issues noted  Diet Order: NPO  EDUCATION NEEDS: -Education not appropriate at this time  No intake or output data in the 24 hours ending 05/01/13 1236  Last BM: PTA  Labs:   Recent Labs Lab 04/30/13 1340 04/30/13 1946 05/01/13 0619  NA 162* 161* 161*  K 3.9 3.4* 3.5  CL 128* 125* 124*  CO2 27 29 26   BUN 27* 23 19  CREATININE 0.96 0.92 0.81  CALCIUM 9.8 10.0 9.7  GLUCOSE 162* 152* 191*    CBG (last 3)   Recent Labs  04/30/13 2110 05/01/13 0556 05/01/13 1117   GLUCAP 144* 162* 158*    Scheduled Meds: . antiseptic oral rinse  15 mL Mouth Rinse BID  . aspirin EC  81 mg Oral Daily  . atorvastatin  20 mg Oral QHS  . clopidogrel  75 mg Oral Q breakfast  . dextrose  1,000 mL Intravenous Once  . heparin  5,000 Units Subcutaneous Q8H  . insulin aspart  0-9 Units Subcutaneous Q6H  . sodium chloride  3 mL Intravenous Q12H    Continuous Infusions: . dextrose 50 mL/hr at 04/30/13 9147    Past Medical History  Diagnosis Date  . 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 Diabetes mellitus without complication   . Asthma   . HTN (hypertension)   . Tobacco abuse   . 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 COPD (chronic obstructive pulmonary disease)     Past Surgical History  Procedure Laterality Date  . Appendectomy      Royann Shivers MS,RD,LDN,CSG Office: [phone] Pager: [phone]

## 2013-05-01 NOTE — Progress Notes (Signed)
Updated the patient family Sister/Niece of the patients plan of care for today.  Family express that they would like to speak with the MD.  Dr. Attempted to see the family x2 but the family was not on the floor.  I repaged him when she came back but he was unable to come at that time due to admitting patients in the ED.

## 2013-05-01 NOTE — Progress Notes (Signed)
[facility] PROGRESS NOTE  Christine Durham WUJ:[id number] DOB: 12/28/1940 DOA: 04/29/2013 PCP: Ignatius Specking., MD  Assessment/Plan: #1 severe hypernatremia Likely secondary to volume depletion and dehydration. Patient with poor oral intake secondary to dysphagia and recent strokes with failure to thrive. Sodium level still elevated at 161. Will give her 1 L bolus of D5W. Increased IV fluids of D5W to 125 cc per hour. Follow.  #2 acute renal failure Likely secondary to prerenal azotemia. Improved with hydration. Follow.  #3 dysphagia Likely multifactorial secondary to multiple strokes and difficulty clearing secretions. Patient has been seen by speech therapy who is following. Will keep patient n.p.o. No new focal deficits noted. Follow. If no significant improvement with dysphagia may need to consider alternative means of nutrition.  #4 acute encephalopathy Likely secondary to electrolyte disturbance of CVA hyponatremia in the setting of multiple strokes and dementia. Patient is currently afebrile. Chest x-ray on admission was negative. No signs or symptoms of infection. Will follow closely with correction of sodium.  #5 history of ischemic stroke 9/ 2014 with an expressive aphasia. Patient did have a history of extension of stroke October 2040 with dysphagia, dysarthria, right hemiplegia and aspiration pneumonia. Left hemispheric watershed distribution. No evidence of progression or neurological dysfunction. Continue aspirin and Plavix for secondary stroke prevention.  #6 failure to thrive Likely secondary to #3 and 5.  #7 COPD Stable. Continue nebs.  #8 chest congestion by history Patient is afebrile. Chest x-ray on admission was negative. Monitor.  #9 generalized weakness PT evaluation pending.  #10 dementia Stable. When tolerating by mouth they were resume Aricept.  Code Status: Full Family Communication: Updated patient. No family at bedside. Disposition Plan: Home  when medically stable.   Consultants:  Speech therapy  Procedures:  Chest x-ray 04/29/2013  Antibiotics:  None  HPI/Subjective: Patient alert. Patient tracking. Patient nonverbal.  Objective: Filed Vitals:   05/01/13 0610  BP: 137/60  Pulse: 93  Temp: 97.2 F (36.2 C)  Resp: 17   No intake or output data in the 24 hours ending 05/01/13 1312 Filed Weights   04/29/13 2009 04/30/13 0500 05/01/13 0500  Weight: 52 kg (114 lb 10.2 oz) 54.5 kg (120 lb 2.4 oz) 52.6 kg (115 lb 15.4 oz)    Exam:   General:  NAD  Cardiovascular: RRR  Respiratory: CTAB anterior lung fields  Abdomen: Soft/NT/ND+BS  Musculoskeletal: No c/c/e   Data Reviewed: Basic Metabolic Panel:  Recent Labs Lab 04/29/13 1647 04/30/13 0750 04/30/13 1340 04/30/13 1946 05/01/13 0619  NA 161* 163* 162* 161* 161*  K 3.9 3.7 3.9 3.4* 3.5  CL 119* 129* 128* 125* 124*  CO2 26 25 27 29 26   GLUCOSE 174* 149* 162* 152* 191*  BUN 44* 31* 27* 23 19  CREATININE 1.35* 0.91 0.96 0.92 0.81  CALCIUM 10.7* 9.6  9.5 9.8 10.0 9.7   Liver Function Tests:  Recent Labs Lab 04/29/13 1647 04/30/13 0750  AST 16 18  ALT 17 15  ALKPHOS 134* 109  BILITOT 0.4 0.3  PROT 8.6* 6.9  ALBUMIN 4.1 3.2*   No results found for this basename: LIPASE, AMYLASE,  in the last 168 hours No results found for this basename: AMMONIA,  in the last 168 hours CBC:  Recent Labs Lab 04/29/13 1647  WBC 12.6*  NEUTROABS 8.7*  HGB 13.7  HCT 42.8  MCV 93.7  PLT 304   Cardiac Enzymes: No results found for this basename: CKTOTAL, CKMB, CKMBINDEX, TROPONINI,  in the last 168 hours  BNP (last 3 results)  Recent Labs  06/26/12 2320  PROBNP 150.2*   CBG:  Recent Labs Lab 04/30/13 1206 04/30/13 1704 04/30/13 2110 05/01/13 0556 05/01/13 1117  GLUCAP 139* 125* 144* 162* 158*    Recent Results (from the past 240 hour(s))  MRSA PCR SCREENING     Status: None   Collection Time    04/29/13 11:01 PM      Result  Value Range Status   MRSA by PCR NEGATIVE  NEGATIVE Final   Comment:            The GeneXpert MRSA Assay (FDA     approved for NASAL specimens     only), is one component of a     comprehensive MRSA colonization     surveillance program. It is not     intended to diagnose MRSA     infection nor to guide or     monitor treatment for     MRSA infections.     Studies: Dg Chest 2 View  04/29/2013   CLINICAL DATA:  Congestion. Short of breath.  EXAM: CHEST  2 VIEW  COMPARISON:  04/15/2013  FINDINGS: Low lung volumes. Upper normal heart size. Upper lungs clear. No pneumothorax. Stable T12 compression deformity.  IMPRESSION: Bibasilar atelectasis.   Electronically Signed   By: Maryclare Bean M.D.   On: 04/29/2013 17:28    Scheduled Meds: . antiseptic oral rinse  15 mL Mouth Rinse BID  . aspirin EC  81 mg Oral Daily  . atorvastatin  20 mg Oral QHS  . clopidogrel  75 mg Oral Q breakfast  . dextrose  1,000 mL Intravenous Once  . heparin  5,000 Units Subcutaneous Q8H  . insulin aspart  0-9 Units Subcutaneous Q6H  . sodium chloride  3 mL Intravenous Q12H   Continuous Infusions: . dextrose 50 mL/hr at 04/30/13 2130    Principal Problem:   Hypernatremia Active Problems:   Diabetes mellitus without complication   Dysphagia   Acute encephalopathy   Acute renal failure   Generalized weakness   Adult failure to thrive    Time spent: > 30 mins    [facility]  [facility] Pager [phone]. If 7PM-7AM, please contact night-coverage at [email or web address], password [facility] 05/01/2013, 1:12 PM  LOS: 2 days

## 2013-05-01 NOTE — Care Management Note (Signed)
    Page 1 of 1   05/05/2013     2:20:12 PM   CARE MANAGEMENT NOTE 05/05/2013  Patient:  Christine Durham, Christine Durham   Account Number:  [id number]  Date Initiated:  05/01/2013  Documentation initiated by:  Rosemary Holms  Subjective/Objective Assessment:   Pt was at home but went to [facility]. Admmitted from home. CM will follow for disposition. [facility] with [facility]     Action/Plan:   Anticipated DC Date:     Anticipated DC Plan:  HOME W HOME HEALTH SERVICES      DC Planning Services  CM consult      Choice offered to / List presented to:             Status of service:  Completed, signed off Medicare Important Message given?   (If response is "NO", the following Medicare IM given date fields will be blank) Date Medicare IM given:   Date Additional Medicare IM given:    Discharge Disposition:    Per UR Regulation:    If discussed at Long Length of Stay Meetings, dates discussed:   05/04/2013    Comments:  05/05/13 Rosemary Holms RN BSN CM Pt being evaluated for SNF. CSW assisting.  05/01/13 Rosemary Holms RN BSN CM

## 2013-05-01 NOTE — Progress Notes (Signed)
Asked MD if I could change the ASA to PR.  New orders given and followed.

## 2013-05-02 LAB — BASIC METABOLIC PANEL
BUN: 9 mg/dL (ref 6–23)
CO2: 29 mEq/L (ref 19–32)
Calcium: 9.5 mg/dL (ref 8.4–10.5)
Chloride: 109 mEq/L (ref 96–112)
Creatinine, Ser: 0.75 mg/dL (ref 0.50–1.10)
Glucose, Bld: 247 mg/dL — ABNORMAL HIGH (ref 70–99)

## 2013-05-02 LAB — GLUCOSE, CAPILLARY
Glucose-Capillary: 188 mg/dL — ABNORMAL HIGH (ref 70–99)
Glucose-Capillary: 227 mg/dL — ABNORMAL HIGH (ref 70–99)
Glucose-Capillary: 151 mg/dL — ABNORMAL HIGH (ref 70–99)
Glucose-Capillary: 239 mg/dL — ABNORMAL HIGH (ref 70–99)

## 2013-05-02 LAB — URINE CULTURE
Colony Count: NO GROWTH
Culture: NO GROWTH

## 2013-05-02 LAB — CBC
Hemoglobin: 11.2 g/dL — ABNORMAL LOW (ref 12.0–15.0)
MCH: 29.3 pg (ref 26.0–34.0)
MCV: 92.4 fL (ref 78.0–100.0)
RBC: 3.82 MIL/uL — ABNORMAL LOW (ref 3.87–5.11)
WBC: 9.9 10*3/uL (ref 4.0–10.5)

## 2013-05-02 LAB — OSMOLALITY, URINE: Osmolality, Ur: 431 mOsm/kg (ref 390–1090)

## 2013-05-02 MED ORDER — BIOTENE DRY MOUTH MT LIQD
15.0000 mL | Freq: Two times a day (BID) | OROMUCOSAL | Status: DC
  Administered 2013-05-02 – 2013-05-09 (×12): 15 mL via OROMUCOSAL

## 2013-05-02 MED ORDER — DEXTROSE 5 % IV SOLN
INTRAVENOUS | Status: DC
  Administered 2013-05-02 – 2013-05-03 (×3): via INTRAVENOUS

## 2013-05-02 MED ORDER — POTASSIUM CHLORIDE 2 MEQ/ML IV SOLN
INTRAVENOUS | Status: DC
  Administered 2013-05-02: 03:00:00 via INTRAVENOUS
  Filled 2013-05-02 (×2): qty 1000

## 2013-05-02 MED ORDER — CHLORHEXIDINE GLUCONATE 0.12 % MT SOLN
15.0000 mL | Freq: Two times a day (BID) | OROMUCOSAL | Status: DC
  Administered 2013-05-02 – 2013-05-09 (×15): 15 mL via OROMUCOSAL
  Filled 2013-05-02 (×15): qty 15

## 2013-05-02 MED ORDER — POTASSIUM CHLORIDE 10 MEQ/100ML IV SOLN
10.0000 meq | INTRAVENOUS | Status: AC
  Administered 2013-05-02 (×4): 10 meq via INTRAVENOUS
  Filled 2013-05-02 (×4): qty 100

## 2013-05-02 NOTE — Evaluation (Signed)
Dysphagia Intervention  Patient Details  Name: Christine Durham MRN: [id number] Date of Birth: [birth year]  Today's Date: 05/02/2013 Time: 1100-1126 SLP Time Calculation (min): 26 min  Past Medical History:  Past Medical History  Diagnosis Date  . 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 Diabetes mellitus without complication   . Asthma   . HTN (hypertension)   . Tobacco abuse   . 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 COPD (chronic obstructive pulmonary disease)    Past Surgical History:  Past Surgical History  Procedure Laterality Date  . Appendectomy     HPI:  72 y.o. female. known to SLP from previous admissions. Past history of stroke, admitted to [facility] on September 10 with acute left hemispheric stroke, manifesting as dysphagia dysarthria and right hemiplegia. MRI/MRA showed stenosis of the carotid arteries. She was discharged to skilled nursing facility on a pured diet for rehabilitation. She was readmitted to [facility] in early October because of shortness of breath, worsening cough and inability to clear her secretions. She was discharged home a couple of weeks ago on a pureed diet with honey thick liquids where she tolerated diet well for a few days, but demonstrated decreased intake last week and suspected worsening swallow function. Pt was readmitted with dehydration and has not really spoken to family since admission (previously was able to follow simple commands and communicate with assist). Family feels strongly that she needs oral suction at home due to thick, ropey secretions in oropharynx.     Assessment / Plan / Recommendation Clinical Impression  Pt appears somewhat more alert this AM, but still does not attempt to communicate. Pt repositioned to upright, open curtains to room, and provided oral care. Pt continues breathe with mouth open posture and has accumulated sticky, thick secretions adhering to back of tongue and along uvula. Oral secretions were very  difficult to remove even with suctioning. Pt made no attempt to manipulate bolus (ice chips and nectars) in oral cavity and oral suctioning was required. Pt continues to be appropriate for po diet at this time. SLP will check on pt later in the day to see if any changes. Pt may require alternative means of nutrition if family wishes to pursue.    Aspiration Risk   HIGH   Diet Recommendation  NPO        Other  Recommendations Oral Care Recommendations: Oral care Q4 per protocol;Staff/trained caregiver to provide oral care   Follow Up Recommendations   SNF    Frequency and Duration         Swallow Study Prior Functional Status       General HPI: 72 y.o. female. known to SLP from previous admissions. Past history of stroke, admitted to [facility] on September 10 with acute left hemispheric stroke, manifesting as dysphagia dysarthria and right hemiplegia. MRI/MRA showed stenosis of the carotid arteries. She was discharged to skilled nursing facility on a pured diet for rehabilitation. She was readmitted to [facility] in early October because of shortness of breath, worsening cough and inability to clear her secretions. She was discharged home a couple of weeks ago on a pureed diet with honey thick liquids where she tolerated diet well for a few days, but demonstrated decreased intake last week and suspected worsening swallow function. Pt was readmitted with dehydration and has not really spoken to family since admission (previously was able to follow simple commands and communicate with assist). Family feels strongly  that she needs oral suction at home due to thick, ropey secretions in oropharynx.   Behavior/Cognition: Requires cueing;Decreased sustained attention Patient Positioning: Upright in bed    Oral/Motor/Sensory Function     Ice Chips     Thin Liquid      Nectar Thick     Honey Thick     Puree     Solid   GO            Christine Durham 05/02/2013,12:09 PM

## 2013-05-02 NOTE — Progress Notes (Addendum)
[facility] PROGRESS NOTE  Christine Durham ZOX:[id number] DOB: [birth year] DOA: 04/29/2013 PCP: Ignatius Specking., MD  Addendum: discussed with sister at bedside, reviewed current diagnoses, treatment plan. While there has been modest improvement in her mentation, there has been no improvement in her swallowing ability. She does not speak. Sister has noted decline since prior to admission. We discussed treatment options including feeding tube versus comfort feeds. I have recommended DO NOT RESUSCITATE status as well. Next of kin is patient's son who I spoke to on the phone today and defers all decision-making to the patient's sister at bedside. Will reevaluate in the morning, sister will work through the decision-making process as outlined. Given the patient's debility I would recommend comfort feeds, and consideration of hospice.  Assessment/Plan: 1. Severe hypernatremia: Secondary to poor oral intake, failure to thrive. Gradually improving with IV fluids. 2. Acute renal failure, dehydration: Renal failure has resolved. Dehydration improving. Continue IV fluids. 3. Acute encephalopathy: Modest improvement. Suspect metabolic in nature secondary to electrolyte disturbance. No evidence of infection. 4. Dysphagia: Failed speech evaluation. Likely multifactorial, secondary to multiple strokes as well as difficulty clearing secretions. Remain n.p.o. Will allow crush meds with applesauce. Speech therapy evaluation appreciated. No new focal deficits, doubt extension of stroke. 5. History of ischemic stroke 03/2013 with expressive aphasia. History of extension of stroke 04/2013 with dysphagia, dysarthria, right hemiplegia and aspiration pneumonia. Left hemispheric watershed  distribution. Continues on aspirin, Plavix. No evidence of progression, no new neurologic dysfunction. 6. Generalized weakness: Physical therapy evaluation. 7. Failure to thrive: Likely secondary to dysphagia, recent strokes. 8. Chest  congestion by history: No evidence of pneumonia. Monitor clinically. 9. Diabetes mellitus: Overall. To just sliding scale insulin. Resume metformin on discharge. 10. COPD: Appears stable. Continue nebulizer therapy. 11. Dementia: Resume Aricept when able. 12. Severe malnutrition in context of chronic illness  Overall there has been some improvement in alertness. The patient does follow some simple commands. Her sodium is improving, kidney function is normal and there is no evidence of infection or new neurologic process. However, she continues to have great difficulty clearing secretions with evidence of saliva accumulation and her prognosis is guarded. At this point will continue IV fluids, in hopes that correction of electrolyte abnormalities will result in improved mentation. If not, consideration will need to be given to Peg tube versus comfort feeds.   Change to moderate scale sliding insulin, q4 CBG  BMP in AM  Replete potassium  Will discuss with family--it would be appropriate to consider changing CODE STATUS and pursuing more conservative care with a focus on dignity and quality of life versus quantity.  Pending studies:   Urine culture  Code Status: Full code DVT prophylaxis: Heparin  Family Communication: none present Disposition Plan: pending further evaluation  Brendia Sacks, MD  [facility]  Pager [phone] If 7PM-7AM, please contact night-coverage at [email or web address], password [facility] 05/02/2013, 2:09 PM  LOS: 3 days   Summary: 72 year old woman with history of 2 strokes within the last 60 days, occlusion of the left carotid artery, severe dysphagia presented with dehydration, poor oral intake the last several days. Family noted progressive difficulty swallowing foods. No suction at home. Patient has been unable 8 secondary to secretions. No new focal deficits noted except for dysphagia.  Consultants:  Speech therapy:  N.p.o.  Procedures:    HPI/Subjective: N.p.o. based on speech therapy evaluation yesterday. Patient has remained with poor responsiveness. She can offer no history today.  Objective: Filed Vitals:   05/01/13 1500  05/01/13 2230 05/02/13 0323 05/02/13 0621  BP: 153/77 119/63  157/77  Pulse: 78 77  86  Temp: 98.4 F (36.9 C) 98.3 F (36.8 C)  98.8 F (37.1 C)  TempSrc: Axillary Oral  Oral  Resp: 20 19  18   Height:      Weight:   53.6 kg (118 lb 2.7 oz)   SpO2: 99% 100%  96%    Intake/Output Summary (Last 24 hours) at 05/02/13 1409 Last data filed at 05/02/13 0800  Gross per 24 hour  Intake      0 ml  Output      0 ml  Net      0 ml     Filed Weights   04/30/13 0500 05/01/13 0500 05/02/13 0323  Weight: 54.5 kg (120 lb 2.4 oz) 52.6 kg (115 lb 15.4 oz) 53.6 kg (118 lb 2.7 oz)    Exam:   Afebrile, vital signs stable.  General: Appears calm. Eyes open. She does not speak or respond verbally. She does follow some simple commands. She appears somewhat more alert than 48 hours ago. She appears debilitated and chronically ill.  Cardiovascular: Regular rate and rhythm. No murmur, rub, gallop. No lower extremity edema.  Respiratory: Clear to auscultation bilaterally. No wheezes, rales or rhonchi. Normal respiratory effort.  Telemetry: Sinus rhythm  Abdomen: Soft, nontender, nondistended  Musculoskeletal: Both feet warm and dry. No edema. Participates weakly, squeezes with both hands to command, weaker on right consistent with known history. Moves both feet minimally.  Data Reviewed:  Capillary blood sugars overall stable  Sodium 161 >> 152 >> 146  Potassium 3.2  CBC unremarkable  Scheduled Meds: . antiseptic oral rinse  15 mL Mouth Rinse q12n4p  . aspirin  300 mg Rectal Daily  . atorvastatin  20 mg Oral QHS  . chlorhexidine  15 mL Mouth Rinse BID  . clopidogrel  75 mg Oral Q breakfast  . heparin  5,000 Units Subcutaneous Q8H  . insulin aspart  0-9 Units  Subcutaneous Q6H  . sodium chloride  3 mL Intravenous Q12H   Continuous Infusions: . dextrose 125 mL/hr at 05/02/13 1610    Principal Problem:   Hypernatremia Active Problems:   Diabetes mellitus without complication   Dysphagia   Acute encephalopathy   Acute renal failure   Generalized weakness   Adult failure to thrive   Protein-calorie malnutrition, severe   Time spent 20 minutes

## 2013-05-02 NOTE — Progress Notes (Signed)
Inpatient Diabetes Program Recommendations  AACE/ADA: New Consensus Statement on Inpatient Glycemic Control (2013)  Target Ranges:  Prepandial:   less than 140 mg/dL      Peak postprandial:   less than 180 mg/dL (1-2 hours)      Critically ill patients:  140 - 180 mg/dL   Results for ANACLARA, ACKLIN (MRN [id number]) as of 05/02/2013 09:15  Ref. Range 04/30/2013 07:50 04/30/2013 13:40 04/30/2013 19:46 05/01/2013 06:19 05/01/2013 16:55 05/02/2013 06:29  Glucose Latest Range: 70-99 mg/dL 409 (H) 811 (H) 914 (H) 191 (H) 278 (H) 247 (H)    Inpatient Diabetes Program Recommendations Correction (SSI): Please consider increasing Novolog to moderate correction and change frequency from Q6H to Q4H.  Note: Patient has a history of diabetes and takes Metformin 500 mg BID as an outpatient for diabetes management.  Currently, patient is ordered to receive Novolog 0-9 units Q6H for inpatient glycemic control.  Fasting glucose this morning was noted to be 247 mg/dl and patient was ordered to start on D5 @125  ml/hr this morning at 7:45 which is likely to further elevate glucose. Please consider increasing Novolog correction to moderate scale and change frequency to Q4H.  Will continue to follow.  Thanks, Orlando Penner, RN, MSN, CCRN Diabetes Coordinator Inpatient Diabetes Program [phone] (Team Pager) [phone] ([place] office) [phone] [facility] office)

## 2013-05-03 LAB — CBC
HCT: 33.8 % — ABNORMAL LOW (ref 36.0–46.0)
Hemoglobin: 11 g/dL — ABNORMAL LOW (ref 12.0–15.0)
MCH: 29.3 pg (ref 26.0–34.0)
MCHC: 32.5 g/dL (ref 30.0–36.0)
MCV: 90.1 fL (ref 78.0–100.0)
Platelets: 197 10*3/uL (ref 150–400)
RBC: 3.75 MIL/uL — ABNORMAL LOW (ref 3.87–5.11)
WBC: 10.9 10*3/uL — ABNORMAL HIGH (ref 4.0–10.5)

## 2013-05-03 LAB — BASIC METABOLIC PANEL
CO2: 28 mEq/L (ref 19–32)
Calcium: 9.7 mg/dL (ref 8.4–10.5)
Chloride: 105 mEq/L (ref 96–112)
Creatinine, Ser: 0.77 mg/dL (ref 0.50–1.10)
Glucose, Bld: 319 mg/dL — ABNORMAL HIGH (ref 70–99)
Potassium: 3.5 mEq/L (ref 3.5–5.1)
Sodium: 143 mEq/L (ref 135–145)

## 2013-05-03 LAB — GLUCOSE, CAPILLARY
Glucose-Capillary: 260 mg/dL — ABNORMAL HIGH (ref 70–99)
Glucose-Capillary: 311 mg/dL — ABNORMAL HIGH (ref 70–99)

## 2013-05-03 MED ORDER — INSULIN ASPART 100 UNIT/ML ~~LOC~~ SOLN
0.0000 [IU] | Freq: Three times a day (TID) | SUBCUTANEOUS | Status: DC
  Administered 2013-05-03: 11 [IU] via SUBCUTANEOUS
  Administered 2013-05-03 – 2013-05-04 (×2): 2 [IU] via SUBCUTANEOUS
  Administered 2013-05-04: 5 [IU] via SUBCUTANEOUS

## 2013-05-03 MED ORDER — KCL IN DEXTROSE-NACL 40-5-0.45 MEQ/L-%-% IV SOLN
INTRAVENOUS | Status: DC
  Administered 2013-05-03 – 2013-05-05 (×3): via INTRAVENOUS

## 2013-05-03 MED ORDER — POTASSIUM CHLORIDE 2 MEQ/ML IV SOLN: INTRAVENOUS | Status: DC

## 2013-05-03 MED ORDER — ACETAMINOPHEN 650 MG RE SUPP
650.0000 mg | Freq: Four times a day (QID) | RECTAL | Status: DC | PRN
  Administered 2013-05-04 – 2013-05-06 (×2): 650 mg via RECTAL
  Filled 2013-05-03 (×2): qty 1

## 2013-05-03 NOTE — Progress Notes (Signed)
[facility] PROGRESS NOTE  Christine Durham EAV:[id number] DOB: 1941-06-20 DOA: 04/29/2013 PCP: Ignatius Specking., MD  Assessment/Plan: #1 severe hypernatremia  Likely secondary to volume depletion and dehydration. Patient with poor oral intake secondary to dysphagia and recent strokes with failure to thrive. Sodium level improved with D5W. Currently at 143. Continue current IV fluids and follow.  #2 acute renal failure  Likely secondary to prerenal azotemia. Improved with hydration. Follow.  #3 dysphagia  Likely multifactorial secondary to multiple strokes and difficulty clearing secretions. Patient has been seen by speech therapy with no significant improvement. Patient is currently n.p.o. Per family patient has been unable to tolerate any oral intake for the past 2-3 weeks. No new focal deficits noted. Will consult with GI for PEG tube placement per family request. D/c plavix. #4 acute encephalopathy  Likely secondary to electrolyte disturbance of hyponatremia in the setting of multiple strokes and dementia. Patient is currently afebrile. Chest x-ray on admission was negative. No signs or symptoms of infection. Will follow closely with correction of sodium.  #5 history of ischemic stroke 9/ 2014 with an expressive aphasia.  Patient did have a history of extension of stroke October 2014 with dysphagia, dysarthria, right hemiplegia and aspiration pneumonia. Left hemispheric watershed distribution. No evidence of progression or neurological dysfunction. Continue aspirin. Hold Plavix for PEG placement.  #6 failure to thrive  Likely secondary to #3 and 5.  #7 COPD  Stable. Continue nebs.  #8 chest congestion by history  Patient is afebrile. Chest x-ray on admission was negative. Monitor.  #9 generalized weakness  PT evaluation pending.  #10 dementia  Stable. When tolerating by mouth then may resume Aricept.   Code Status: Full Family Communication: Updated patient and sister at  bedside. Disposition Plan: Home versus SNF when medically stable.   Consultants: Speech therapy GI consult pending     Procedures: Chest x-ray 04/29/2013     Antibiotics:  None  HPI/Subjective: Patient non verbal.  Objective: Filed Vitals:   05/03/13 1423  BP: 148/78  Pulse: 86  Temp: 97.4 F (36.3 C)  Resp: 20    Intake/Output Summary (Last 24 hours) at 05/03/13 1652 Last data filed at 05/03/13 1300  Gross per 24 hour  Intake 1079.17 ml  Output      0 ml  Net 1079.17 ml   Filed Weights   04/30/13 0500 05/01/13 0500 05/02/13 0323  Weight: 54.5 kg (120 lb 2.4 oz) 52.6 kg (115 lb 15.4 oz) 53.6 kg (118 lb 2.7 oz)    Exam:   General:  NAD  Cardiovascular: RRR  Respiratory: CTAB  Abdomen: Soft/NT/ND/+BS  Musculoskeletal: No c/c/e  Data Reviewed: Basic Metabolic Panel:  Recent Labs Lab 04/30/13 1946 05/01/13 0619 05/01/13 1655 05/02/13 0629 05/03/13 0522  NA 161* 161* 152* 146* 143  K 3.4* 3.5 3.1* 3.2* 3.5  CL 125* 124* 115* 109 105  CO2 29 26 30 29 28   GLUCOSE 152* 191* 278* 247* 319*  BUN 23 19 14 9  5*  CREATININE 0.92 0.81 0.79 0.75 0.77  CALCIUM 10.0 9.7 9.7 9.5 9.7   Liver Function Tests:  Recent Labs Lab 04/29/13 1647 04/30/13 0750  AST 16 18  ALT 17 15  ALKPHOS 134* 109  BILITOT 0.4 0.3  PROT 8.6* 6.9  ALBUMIN 4.1 3.2*   No results found for this basename: LIPASE, AMYLASE,  in the last 168 hours No results found for this basename: AMMONIA,  in the last 168 hours CBC:  Recent Labs Lab 04/29/13  1647 05/02/13 0629 05/03/13 0522  WBC 12.6* 9.9 10.9*  NEUTROABS 8.7*  --   --   HGB 13.7 11.2* 11.0*  HCT 42.8 35.3* 33.8*  MCV 93.7 92.4 90.1  PLT 304 197 197   Cardiac Enzymes: No results found for this basename: CKTOTAL, CKMB, CKMBINDEX, TROPONINI,  in the last 168 hours BNP (last 3 results)  Recent Labs  06/26/12 2320  PROBNP 150.2*   CBG:  Recent Labs Lab 05/02/13 2153 05/03/13 0011 05/03/13 0459  05/03/13 0756 05/03/13 1153  GLUCAP 239* 260* 277* 281* 311*    Recent Results (from the past 240 hour(s))  MRSA PCR SCREENING     Status: None   Collection Time    04/29/13 11:01 PM      Result Value Range Status   MRSA by PCR NEGATIVE  NEGATIVE Final   Comment:            The GeneXpert MRSA Assay (FDA     approved for NASAL specimens     only), is one component of a     comprehensive MRSA colonization     surveillance program. It is not     intended to diagnose MRSA     infection nor to guide or     monitor treatment for     MRSA infections.  URINE CULTURE     Status: None   Collection Time    05/01/13  4:40 PM      Result Value Range Status   Specimen Description URINE, CLEAN CATCH   Final   Special Requests NONE   Final   Culture  Setup Time     Final   Value: 05/02/2013 00:29     Performed at [facility] Count     Final   Value: NO GROWTH     Performed at [facility]   Culture     Final   Value: NO GROWTH     Performed at [facility]   Report Status 05/02/2013 FINAL   Final     Studies: No results found.  Scheduled Meds: . antiseptic oral rinse  15 mL Mouth Rinse q12n4p  . aspirin  300 mg Rectal Daily  . chlorhexidine  15 mL Mouth Rinse BID  . clopidogrel  75 mg Oral Q breakfast  . heparin  5,000 Units Subcutaneous Q8H  . insulin aspart  0-15 Units Subcutaneous TID WC  . sodium chloride  3 mL Intravenous Q12H   Continuous Infusions: . dextrose 5 % and 0.45 % NaCl with KCl 40 mEq/L 75 mL/hr at 05/03/13 1550    Principal Problem:   Hypernatremia Active Problems:   Diabetes mellitus without complication   Dysphagia   Acute encephalopathy   Acute renal failure   Generalized weakness   Adult failure to thrive   Protein-calorie malnutrition, severe    Time spent: > 30 mins    [facility]  [facility] Pager [phone]. If 7PM-7AM, please contact night-coverage at [email or web address], password  [facility] 05/03/2013, 4:52 PM  LOS: 4 days

## 2013-05-03 NOTE — Progress Notes (Signed)
Inpatient Diabetes Program Recommendations  AACE/ADA: New Consensus Statement on Inpatient Glycemic Control (2013)  Target Ranges:  Prepandial:   less than 140 mg/dL      Peak postprandial:   less than 180 mg/dL (1-2 hours)      Critically ill patients:  140 - 180 mg/dL   Results for Christine Durham, Christine Durham (MRN [id number]) as of 05/03/2013 10:02  Ref. Range 05/02/2013 06:02 05/02/2013 11:47 05/02/2013 18:46 05/02/2013 21:53 05/03/2013 00:11 05/03/2013 04:59 05/03/2013 07:56  Glucose-Capillary Latest Range: 70-99 mg/dL 528 (H) 413 (H) 244 (H) 239 (H) 260 (H) 277 (H) 281 (H)    Inpatient Diabetes Program Recommendations Correction (SSI): Please order CBGs with Novolog moderate correction scale Q4H.  Note: Patient was ordered to receive Novolog sensitive correction Q6H but the order was discontinued on 10/21 @ 14:22 by Dr. Irene Limbo.  According to Dr. Rene Kocher note on 10/21 the plan was to increase to moderate scale and change to Q4H but it appears the order was never entered.  Please order CBGs with Novolog moderate correction scale Q4H.  Will continue to follow.  Thanks, Orlando Penner, RN, MSN, CCRN Diabetes Coordinator Inpatient Diabetes Program [phone] (Team Pager) [phone] ([place] office) [phone] [facility] office)

## 2013-05-03 NOTE — Progress Notes (Signed)
Speech Language Pathology Treatment:    Patient Details Name: Christine Durham MRN: [id number] DOB: 03/23/1941 Today's Date: 05/03/2013 Time: 4098-1191 SLP Time Calculation (min): 20 min  Assessment / Plan / Recommendation Clinical Impression  Pt cont to remain very lethargic with minimal response to visual, verbal, and tactile cueing. Pt was nonverbal through session. SLP attempted to alert pt and provided spoon sips of nectar liquids, where pt did not respond to tactile cueing with spoon resting on lower lip. SLP did not provide further PO intake as it is expected pt would not initiate swallow upon bolus sensation in oral cavity. Recommend continued NPO with followup with SLP tomorrow for possible increase in alertness and responsiveness.    HPI HPI: 72 y.o. female. known to SLP from previous admissions. Past history of stroke, admitted to [facility] on September 10 with acute left hemispheric stroke, manifesting as dysphagia dysarthria and right hemiplegia. MRI/MRA showed stenosis of the carotid arteries. She was discharged to skilled nursing facility on a pured diet for rehabilitation. She was readmitted to [facility] in early October because of shortness of breath, worsening cough and inability to clear her secretions. She was discharged home a couple of weeks ago on a pureed diet with honey thick liquids where she tolerated diet well for a few days, but demonstrated decreased intake last week and suspected worsening swallow function. Pt was readmitted with dehydration and has not really spoken to family since admission (previously was able to follow simple commands and communicate with assist). Family feels strongly that she needs oral suction at home due to thick, ropey secretions in oropharynx.        SLP Plan  Continue with current plan of care    Recommendations Medication Administration: Crushed with puree; recommend cont NPO, SLP to follow up tomorrow for increased alertness/responsiveness               Oral Care Recommendations: Oral care Q4 per protocol;Staff/trained caregiver to provide oral care Plan: Continue with current plan of care    GO     Niana Martorana S 05/03/2013, 9:16 AM

## 2013-05-04 DIAGNOSIS — R627 Adult failure to thrive: Secondary | ICD-10-CM

## 2013-05-04 DIAGNOSIS — Z681 Body mass index (BMI) 19 or less, adult: Secondary | ICD-10-CM

## 2013-05-04 DIAGNOSIS — I69998 Other sequelae following unspecified cerebrovascular disease: Secondary | ICD-10-CM

## 2013-05-04 DIAGNOSIS — R1311 Dysphagia, oral phase: Secondary | ICD-10-CM

## 2013-05-04 DIAGNOSIS — E43 Unspecified severe protein-calorie malnutrition: Secondary | ICD-10-CM

## 2013-05-04 LAB — CBC
HCT: 33 % — ABNORMAL LOW (ref 36.0–46.0)
Hemoglobin: 10.7 g/dL — ABNORMAL LOW (ref 12.0–15.0)
MCHC: 32.4 g/dL (ref 30.0–36.0)
MCV: 89.7 fL (ref 78.0–100.0)
WBC: 10.4 10*3/uL (ref 4.0–10.5)

## 2013-05-04 LAB — PROTIME-INR
INR: 1.12 (ref 0.00–1.49)
Prothrombin Time: 14.2 seconds (ref 11.6–15.2)

## 2013-05-04 LAB — BASIC METABOLIC PANEL
BUN: 6 mg/dL (ref 6–23)
CO2: 28 mEq/L (ref 19–32)
Chloride: 108 mEq/L (ref 96–112)
GFR calc Af Amer: >90 mL/min (ref >90)
Potassium: 3.5 mEq/L (ref 3.5–5.1)

## 2013-05-04 LAB — GLUCOSE, CAPILLARY
Glucose-Capillary: 228 mg/dL — ABNORMAL HIGH (ref 70–99)
Glucose-Capillary: 147 mg/dL — ABNORMAL HIGH (ref 70–99)
Glucose-Capillary: 143 mg/dL — ABNORMAL HIGH (ref 70–99)

## 2013-05-04 MED ORDER — DEXTROSE 5 % IV SOLN
1.0000 g | Freq: Once | INTRAVENOUS | Status: AC
  Administered 2013-05-05: 1 g via INTRAVENOUS
  Filled 2013-05-04: qty 10

## 2013-05-04 MED ORDER — INSULIN ASPART 100 UNIT/ML ~~LOC~~ SOLN
0.0000 [IU] | SUBCUTANEOUS | Status: DC
  Administered 2013-05-04 – 2013-05-05 (×4): 3 [IU] via SUBCUTANEOUS
  Administered 2013-05-05: 2 [IU] via SUBCUTANEOUS
  Administered 2013-05-05: 5 [IU] via SUBCUTANEOUS
  Administered 2013-05-06 (×2): 3 [IU] via SUBCUTANEOUS

## 2013-05-04 NOTE — Progress Notes (Signed)
Speech Language Pathology Treatment: Dysphagia   Patient Details Name: Christine Durham MRN: [id number] DOB: Feb 22, 1941 Today's Date: 05/04/2013 Time: 4696-2952 SLP Time Calculation (min): 22 min  Assessment / Plan / Recommendation Clinical Impression  ; Pt slightly more alert than when seen previously, Family at bedside. Pt required max cues for trials ice chip and 1/4 tsp sherbet. Severe oral phase dysphagia with known pharyngeal phase in setting of declining mental status. Pt inappropriate for po at this time. SLP will follow pending decision for PEG vs comfort feedings.    HPI HPI: 72 y.o. female. known to SLP from previous admissions. Past history of stroke, admitted to [facility] on September 10 with acute left hemispheric stroke, manifesting as dysphagia dysarthria and right hemiplegia. MRI/MRA showed stenosis of the carotid arteries. She was discharged to skilled nursing facility on a pured diet for rehabilitation. She was readmitted to [facility] in early October because of shortness of breath, worsening cough and inability to clear her secretions. She was discharged home a couple of weeks ago on a pureed diet with honey thick liquids where she tolerated diet well for a few days, but demonstrated decreased intake last week and suspected worsening swallow function. Pt was readmitted with dehydration and has not really spoken to family since admission (previously was able to follow simple commands and communicate with assist). Family feels strongly that she needs oral suction at home due to thick, ropey secretions in oropharynx.        SLP Plan  Continue with current plan of care    Recommendations Diet recommendations: NPO              Oral Care Recommendations: Oral care Q4 per protocol Follow up Recommendations: Skilled Nursing facility Plan: Continue with current plan of care    GO     Christine Durham 05/04/2013, 3:24 PM

## 2013-05-04 NOTE — Progress Notes (Signed)
Inpatient Diabetes Program Recommendations  AACE/ADA: New Consensus Statement on Inpatient Glycemic Control (2013)  Target Ranges:  Prepandial:   less than 140 mg/dL      Peak postprandial:   less than 180 mg/dL (1-2 hours)      Critically ill patients:  140 - 180 mg/dL   Results for Christine Durham, Christine Durham (MRN [id number]) as of 05/04/2013 09:19  Ref. Range 05/03/2013 04:59 05/03/2013 07:56 05/03/2013 11:53 05/03/2013 16:58 05/03/2013 20:06 05/04/2013 00:27 05/04/2013 04:16 05/04/2013 07:26  Glucose-Capillary Latest Range: 70-99 mg/dL 119 (H) 147 (H) 829 (H) 142 (H) 117 (H) 201 (H) 233 (H) 228 (H)    Inpatient Diabetes Program Recommendations Correction (SSI): Please change frequency of Novolog correction to Q4H (currently with meals and patient is NPO due to dysphagia).  Note: Note the moderate correction scale AC (with meals) was ordered yesterday at 11:20 and patient received first Novolog correction at 12:39 for blood glucose of 311 mg/dl and following blood glucose after Novolog correction was improved.  No correction was given for midnight and 4am CBGs because Novolog correction is only ordered AC.  Since patient is NPO due to dysphagia, please change frequency of Novolog correction to Q4H.  Will continue to follow.  Thanks, Orlando Penner, RN, MSN, CCRN Diabetes Coordinator Inpatient Diabetes Program [phone] (Team Pager) [phone] ([place] office) [phone] [facility] office)

## 2013-05-04 NOTE — Consult Note (Signed)
Reason for Consult:dysphagia Referring Physician: Hospitalist  Christine Durham is an 72 y.o. female.  HPI: History obtained from recent H and P. Patient is unable to give a hx. Admitted thru the ED 04/29/2013 with worsening dysphagia. Admitted with dehydration secondary to progressive dysphagia.  Per notes, she has not been able to eat over the past 4-5 days.  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 Prior  CVA in 2011. MRI on 04/12/2013 : IMPRESSION:  Progression of the previously identified left hemisphere multifocal  infarcts in a watershed distribution. The left internal carotid  artery which was previously stenotic but patent on prior MRI/ MRA  from 03/22/2013 is now thrombosed. Subcentimeter right occipital  infarct incidentally noted.  Baseline atrophy and chronic microvascular ischemic change without  intracranial hemorrhage or mass-effect.  She is followed by Dr. Edilia Bo, vascular. Hx of severe COPD, Diabetes, MI    Past Medical History  Diagnosis Date  . 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 Diabetes mellitus without complication   . Asthma   . HTN (hypertension)   . Tobacco abuse   . 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 COPD (chronic obstructive pulmonary disease)     Past Surgical History  Procedure Laterality Date  . Appendectomy      Family History  Problem Relation Age of Onset  . Diabetes Sister   . Hyperlipidemia Sister   . Hypertension Sister     Social History:  reports that she quit smoking about 2 years ago. She does not have any smokeless tobacco history on file. She reports that she does not drink alcohol or use illicit drugs.  Allergies: No Known Allergies  Medications: I have reviewed the patient's current medications.  Results for orders placed during the hospital encounter of 04/29/13 (from the past 48 hour(s))  GLUCOSE, CAPILLARY     Status: Abnormal   Collection Time    05/02/13 11:47 AM      Result Value  Range   Glucose-Capillary 227 (*) 70 - 99 mg/dL   Comment 1 Notify RN    GLUCOSE, CAPILLARY     Status: Abnormal   Collection Time    05/02/13  6:46 PM      Result Value Range   Glucose-Capillary 151 (*) 70 - 99 mg/dL   Comment 1 Notify RN     Comment 2 Documented in Chart    GLUCOSE, CAPILLARY     Status: Abnormal   Collection Time    05/02/13  9:53 PM      Result Value Range   Glucose-Capillary 239 (*) 70 - 99 mg/dL   Comment 1 Notify RN     Comment 2 Documented in Chart    GLUCOSE, CAPILLARY     Status: Abnormal   Collection Time    05/03/13 12:11 AM      Result Value Range   Glucose-Capillary 260 (*) 70 - 99 mg/dL  GLUCOSE, CAPILLARY     Status: Abnormal   Collection Time    05/03/13  4:59 AM      Result Value Range   Glucose-Capillary 277 (*) 70 - 99 mg/dL  BASIC METABOLIC PANEL     Status: Abnormal   Collection Time    05/03/13  5:22 AM      Result Value Range   Sodium 143  135 - 145 mEq/L   Potassium 3.5  3.5 - 5.1 mEq/L   Chloride 105  96 - 112 mEq/L   CO2 28  19 - 32 mEq/L   Glucose, Bld 319 (*) 70 - 99 mg/dL   BUN 5 (*) 6 - 23 mg/dL   Creatinine, Ser 4.09  0.50 - 1.10 mg/dL   Calcium 9.7  8.4 - 81.1 mg/dL   GFR calc non Af Amer 83 (*) >90 mL/min   GFR calc Af Amer >90  >90 mL/min   Comment: (NOTE)     The eGFR has been calculated using the CKD EPI equation.     This calculation has not been validated in all clinical situations.     eGFR's persistently <90 mL/min signify possible Chronic Kidney     Disease.  CBC     Status: Abnormal   Collection Time    05/03/13  5:22 AM      Result Value Range   WBC 10.9 (*) 4.0 - 10.5 K/uL   RBC 3.75 (*) 3.87 - 5.11 MIL/uL   Hemoglobin 11.0 (*) 12.0 - 15.0 g/dL   HCT 91.4 (*) 78.2 - 95.6 %   MCV 90.1  78.0 - 100.0 fL   MCH 29.3  26.0 - 34.0 pg   MCHC 32.5  30.0 - 36.0 g/dL   RDW 21.3  08.6 - 57.8 %   Platelets 197  150 - 400 K/uL  GLUCOSE, CAPILLARY     Status: Abnormal   Collection Time    05/03/13  7:56 AM       Result Value Range   Glucose-Capillary 281 (*) 70 - 99 mg/dL   Comment 1 Notify RN    GLUCOSE, CAPILLARY     Status: Abnormal   Collection Time    05/03/13 11:53 AM      Result Value Range   Glucose-Capillary 311 (*) 70 - 99 mg/dL   Comment 1 Notify RN    GLUCOSE, CAPILLARY     Status: Abnormal   Collection Time    05/03/13  4:58 PM      Result Value Range   Glucose-Capillary 142 (*) 70 - 99 mg/dL   Comment 1 Notify RN     Comment 2 Documented in Chart    GLUCOSE, CAPILLARY     Status: Abnormal   Collection Time    05/03/13  8:06 PM      Result Value Range   Glucose-Capillary 117 (*) 70 - 99 mg/dL   Comment 1 Notify RN     Comment 2 Documented in Chart    CULTURE, BLOOD (ROUTINE X 2)     Status: None   Collection Time    05/04/13 12:02 AM      Result Value Range   Specimen Description BLOOD RIGHT HAND     Special Requests       Value: BOTTLES DRAWN AEROBIC AND ANAEROBIC AEB 6CC ANA 5CC   Culture PENDING     Report Status PENDING    CULTURE, BLOOD (ROUTINE X 2)     Status: None   Collection Time    05/04/13 12:02 AM      Result Value Range   Specimen Description BLOOD LEFT HAND     Special Requests       Value: BOTTLES DRAWN AEROBIC AND ANAEROBIC AEB 6CC ANA 5CC   Culture PENDING     Report Status PENDING    GLUCOSE, CAPILLARY     Status: Abnormal   Collection Time    05/04/13 12:27 AM      Result Value Range  Glucose-Capillary 201 (*) 70 - 99 mg/dL  GLUCOSE, CAPILLARY     Status: Abnormal   Collection Time    05/04/13  4:16 AM      Result Value Range   Glucose-Capillary 233 (*) 70 - 99 mg/dL  BASIC METABOLIC PANEL     Status: Abnormal   Collection Time    05/04/13  6:17 AM      Result Value Range   Sodium 143  135 - 145 mEq/L   Potassium 3.5  3.5 - 5.1 mEq/L   Chloride 108  96 - 112 mEq/L   CO2 28  19 - 32 mEq/L   Glucose, Bld 262 (*) 70 - 99 mg/dL   BUN 6  6 - 23 mg/dL   Creatinine, Ser 4.69  0.50 - 1.10 mg/dL   Calcium 9.4  8.4 - 62.9 mg/dL   GFR  calc non Af Amer 83 (*) >90 mL/min   GFR calc Af Amer >90  >90 mL/min   Comment: (NOTE)     The eGFR has been calculated using the CKD EPI equation.     This calculation has not been validated in all clinical situations.     eGFR's persistently <90 mL/min signify possible Chronic Kidney     Disease.  CBC     Status: Abnormal   Collection Time    05/04/13  6:17 AM      Result Value Range   WBC 10.4  4.0 - 10.5 K/uL   RBC 3.68 (*) 3.87 - 5.11 MIL/uL   Hemoglobin 10.7 (*) 12.0 - 15.0 g/dL   HCT 52.8 (*) 41.3 - 24.4 %   MCV 89.7  78.0 - 100.0 fL   MCH 29.1  26.0 - 34.0 pg   MCHC 32.4  30.0 - 36.0 g/dL   RDW 01.0  27.2 - 53.6 %   Platelets 188  150 - 400 K/uL  PROTIME-INR     Status: None   Collection Time    05/04/13  6:17 AM      Result Value Range   Prothrombin Time 14.2  11.6 - 15.2 seconds   INR 1.12  0.00 - 1.49  GLUCOSE, CAPILLARY     Status: Abnormal   Collection Time    05/04/13  7:26 AM      Result Value Range   Glucose-Capillary 228 (*) 70 - 99 mg/dL   Comment 1 Documented in Chart     Comment 2 Notify RN      No results found.  ROS Blood pressure 103/64, pulse 83, temperature 98.2 F (36.8 C), temperature source Axillary, resp. rate 20, height 5\' 7"  (1.702 m), weight 116 lb 13.5 oz (53 kg), SpO2 99.00%. Physical Exam  Non-responsive to verbal stimuli. Mouth breathing. Lung clear. Does not follow commands.. HR regular. Abdomen is soft. BS+ Assessment/Plan: Dysphagia related to recent CVA. Weight loss: 127 in September. Present weight 116. I spoke with Sherene Sires, family member concerning PEG tube placement.  Family member will be in around 2:00pm today to discuss possible Peg tube. SETZER,TERRI W 05/04/2013, 8:26 AM    GI attending note; Patient examined. She unfortunately is not able to provide any history as she has expressive aphasia. Condition reviewed at length with patient's sister Ms. Charlaine Pasqual/Webb. She reminds me that her mother also had stroke  and required PEG placement but she was 6 years ordered and her sister is. She is agreeable to proceed with PEG placement. Procedures scheduled to be performed in am. Will  DC heparin for now. Patient will be given 1 g of ceftriaxone IV prior to procedure.

## 2013-05-04 NOTE — Progress Notes (Signed)
[facility] PROGRESS NOTE  Christine Durham ZOX:[id number] DOB: 1941-02-06 DOA: 04/29/2013 PCP: Ignatius Specking., MD  Assessment/Plan: #1 severe hypernatremia  Likely secondary to volume depletion and dehydration. Patient with poor oral intake secondary to dysphagia and recent strokes with failure to thrive. Sodium level improved with D5W. Currently at 143. Continue current IV fluids and follow.  #2 acute renal failure  Likely secondary to prerenal azotemia. Improved with hydration. Follow.  #3 dysphagia  Likely multifactorial secondary to multiple strokes and difficulty clearing secretions. Patient has been seen by speech therapy with no significant improvement. Patient is currently n.p.o. Per family patient has been unable to tolerate any oral intake for the past 2-3 weeks. No new focal deficits noted. GI consult pending for PEG tube placement per family request. Plavix on hold. #4 acute encephalopathy  Patient is more alert. Likely secondary to electrolyte disturbance of hyponatremia in the setting of multiple strokes and dementia. Patient is currently afebrile. Chest x-ray on admission was negative. No signs or symptoms of infection. Will follow closely with correction of sodium.  #5 history of ischemic stroke 9/ 2014 with an expressive aphasia.  Patient did have a history of extension of stroke October 2014 with dysphagia, dysarthria, right hemiplegia and aspiration pneumonia. Left hemispheric watershed distribution. No evidence of progression or neurological dysfunction. Continue aspirin. Hold Plavix for PEG placement.  #6 failure to thrive  Likely secondary to #3 and 5.  #7 COPD  Stable. Continue nebs.  #8 chest congestion by history  Patient is afebrile. Chest x-ray on admission was negative. Monitor.  #9 generalized weakness  PT evaluation pending.  #10 dementia  Stable. When tolerating by mouth then may resume Aricept.   Code Status: Full Family Communication: Updated patient  and sister at bedside. Disposition Plan: Home versus SNF when medically stable.   Consultants: Speech therapy GI consult pending     Procedures: Chest x-ray 04/29/2013     Antibiotics:  None  HPI/Subjective: Patient non verbal. Patient was tracking.  Objective: Filed Vitals:   05/04/13 0420  BP: 103/64  Pulse: 83  Temp: 98.2 F (36.8 C)  Resp: 20    Intake/Output Summary (Last 24 hours) at 05/04/13 1527 Last data filed at 05/04/13 1429  Gross per 24 hour  Intake      3 ml  Output      0 ml  Net      3 ml   Filed Weights   05/01/13 0500 05/02/13 0323 05/04/13 0500  Weight: 52.6 kg (115 lb 15.4 oz) 53.6 kg (118 lb 2.7 oz) 53 kg (116 lb 13.5 oz)    Exam:   General:  NAD  Cardiovascular: RRR  Respiratory: CTAB  Abdomen: Soft/NT/ND/+BS  Musculoskeletal: No c/c/e  Data Reviewed: Basic Metabolic Panel:  Recent Labs Lab 05/01/13 0619 05/01/13 1655 05/02/13 0629 05/03/13 0522 05/04/13 0617  NA 161* 152* 146* 143 143  K 3.5 3.1* 3.2* 3.5 3.5  CL 124* 115* 109 105 108  CO2 26 30 29 28 28   GLUCOSE 191* 278* 247* 319* 262*  BUN 19 14 9  5* 6  CREATININE 0.81 0.79 0.75 0.77 0.77  CALCIUM 9.7 9.7 9.5 9.7 9.4   Liver Function Tests:  Recent Labs Lab 04/29/13 1647 04/30/13 0750  AST 16 18  ALT 17 15  ALKPHOS 134* 109  BILITOT 0.4 0.3  PROT 8.6* 6.9  ALBUMIN 4.1 3.2*   No results found for this basename: LIPASE, AMYLASE,  in the last 168 hours No results  found for this basename: AMMONIA,  in the last 168 hours CBC:  Recent Labs Lab 04/29/13 1647 05/02/13 0629 05/03/13 0522 05/04/13 0617  WBC 12.6* 9.9 10.9* 10.4  NEUTROABS 8.7*  --   --   --   HGB 13.7 11.2* 11.0* 10.7*  HCT 42.8 35.3* 33.8* 33.0*  MCV 93.7 92.4 90.1 89.7  PLT 304 197 197 188   Cardiac Enzymes: No results found for this basename: CKTOTAL, CKMB, CKMBINDEX, TROPONINI,  in the last 168 hours BNP (last 3 results)  Recent Labs  06/26/12 2320  PROBNP 150.2*    CBG:  Recent Labs Lab 05/04/13 0027 05/04/13 0416 05/04/13 0726 05/04/13 1137 05/04/13 1243  GLUCAP 201* 233* 228* 147* 143*    Recent Results (from the past 240 hour(s))  MRSA PCR SCREENING     Status: None   Collection Time    04/29/13 11:01 PM      Result Value Range Status   MRSA by PCR NEGATIVE  NEGATIVE Final   Comment:            The GeneXpert MRSA Assay (FDA     approved for NASAL specimens     only), is one component of a     comprehensive MRSA colonization     surveillance program. It is not     intended to diagnose MRSA     infection nor to guide or     monitor treatment for     MRSA infections.  URINE CULTURE     Status: None   Collection Time    05/01/13  4:40 PM      Result Value Range Status   Specimen Description URINE, CLEAN CATCH   Final   Special Requests NONE   Final   Culture  Setup Time     Final   Value: 05/02/2013 00:29     Performed at [facility] Count     Final   Value: NO GROWTH     Performed at [facility]   Culture     Final   Value: NO GROWTH     Performed at [facility]   Report Status 05/02/2013 FINAL   Final  CULTURE, BLOOD (ROUTINE X 2)     Status: None   Collection Time    05/04/13 12:02 AM      Result Value Range Status   Specimen Description BLOOD RIGHT HAND   Final   Special Requests     Final   Value: BOTTLES DRAWN AEROBIC AND ANAEROBIC AEB 6CC ANA 5CC   Culture NO GROWTH <24 HRS   Final   Report Status PENDING   Incomplete  CULTURE, BLOOD (ROUTINE X 2)     Status: None   Collection Time    05/04/13 12:02 AM      Result Value Range Status   Specimen Description BLOOD LEFT HAND   Final   Special Requests     Final   Value: BOTTLES DRAWN AEROBIC AND ANAEROBIC AEB 6CC ANA 5CC   Culture NO GROWTH <24 HRS   Final   Report Status PENDING   Incomplete     Studies: No results found.  Scheduled Meds: . antiseptic oral rinse  15 mL Mouth Rinse q12n4p  . aspirin  300 mg Rectal  Daily  . chlorhexidine  15 mL Mouth Rinse BID  . heparin  5,000 Units Subcutaneous Q8H  . insulin aspart  0-15 Units Subcutaneous TID WC  .  sodium chloride  3 mL Intravenous Q12H   Continuous Infusions: . dextrose 5 % and 0.45 % NaCl with KCl 40 mEq/L 75 mL/hr at 05/04/13 1610    Principal Problem:   Hypernatremia Active Problems:   Diabetes mellitus without complication   Dysphagia   Acute encephalopathy   Acute renal failure   Generalized weakness   Adult failure to thrive   Protein-calorie malnutrition, severe    Time spent: > 30 mins    [facility]  [facility] Pager [phone]. If 7PM-7AM, please contact night-coverage at [email or web address], password [facility] 05/04/2013, 3:27 PM  LOS: 5 days

## 2013-05-05 ENCOUNTER — Encounter (HOSPITAL_COMMUNITY)
Admission: EM | Disposition: A | Discharge status: Skilled Nursing Facility | Payer: Self-pay | Source: Home / Self Care | Attending: Internal Medicine

## 2013-05-05 ENCOUNTER — Encounter (HOSPITAL_COMMUNITY): Payer: Self-pay | Admitting: *Deleted

## 2013-05-05 HISTORY — PX: ESOPHAGOGASTRODUODENOSCOPY: SHX5428

## 2013-05-05 HISTORY — PX: PEG PLACEMENT: SHX5437

## 2013-05-05 LAB — GLUCOSE, CAPILLARY
Glucose-Capillary: 128 mg/dL — ABNORMAL HIGH (ref 70–99)
Glucose-Capillary: 199 mg/dL — ABNORMAL HIGH (ref 70–99)
Glucose-Capillary: 189 mg/dL — ABNORMAL HIGH (ref 70–99)
Glucose-Capillary: 177 mg/dL — ABNORMAL HIGH (ref 70–99)

## 2013-05-05 LAB — CBC
HCT: 33.5 % — ABNORMAL LOW (ref 36.0–46.0)
Hemoglobin: 11 g/dL — ABNORMAL LOW (ref 12.0–15.0)
MCH: 29.3 pg (ref 26.0–34.0)
MCHC: 32.8 g/dL (ref 30.0–36.0)
MCV: 89.3 fL (ref 78.0–100.0)
RDW: 13.5 % (ref 11.5–15.5)

## 2013-05-05 LAB — VITAMIN D 1,25 DIHYDROXY
Vitamin D 1, 25 (OH)2 Total: 12 pg/mL — ABNORMAL LOW (ref 18–72)
Vitamin D2 1, 25 (OH)2: <8 pg/mL
Vitamin D3 1, 25 (OH)2: 12 pg/mL

## 2013-05-05 LAB — BASIC METABOLIC PANEL
BUN: 6 mg/dL (ref 6–23)
CO2: 24 mEq/L (ref 19–32)
Chloride: 111 mEq/L (ref 96–112)
Creatinine, Ser: 0.7 mg/dL (ref 0.50–1.10)
GFR calc Af Amer: >90 mL/min (ref >90)
Glucose, Bld: 219 mg/dL — ABNORMAL HIGH (ref 70–99)

## 2013-05-05 SURGERY — INSERTION, PEG TUBE
Anesthesia: Moderate Sedation

## 2013-05-05 MED ORDER — SODIUM CHLORIDE 0.9 % IV SOLN
INTRAVENOUS | Status: DC
  Administered 2013-05-05 (×2): via INTRAVENOUS

## 2013-05-05 MED ORDER — LIDOCAINE 1 % OPTIME INJ - NO CHARGE
INTRAMUSCULAR | Status: DC | PRN
  Administered 2013-05-05: 2.5 mg

## 2013-05-05 MED ORDER — MEPERIDINE HCL 50 MG/ML IJ SOLN
INTRAMUSCULAR | Status: AC
  Filled 2013-05-05: qty 1

## 2013-05-05 MED ORDER — PANTOPRAZOLE SODIUM 40 MG PO TBEC: 40.0000 mg | DELAYED_RELEASE_TABLET | Freq: Every day | ORAL | Status: DC

## 2013-05-05 MED ORDER — DEXTROSE 5 % IV SOLN
INTRAVENOUS | Status: DC
  Administered 2013-05-05 – 2013-05-06 (×3): 1000 mL via INTRAVENOUS

## 2013-05-05 MED ORDER — PANTOPRAZOLE SODIUM 40 MG IV SOLR
40.0000 mg | INTRAVENOUS | Status: DC
  Administered 2013-05-05: 40 mg via INTRAVENOUS
  Filled 2013-05-05: qty 40

## 2013-05-05 MED ORDER — SODIUM CHLORIDE 0.9 % IR SOLN
50.0000 mL | Status: DC
  Administered 2013-05-05: 50 mL

## 2013-05-05 MED ORDER — DEXTROSE 5 % IV SOLN: INTRAVENOUS | Status: DC

## 2013-05-05 MED ORDER — CLOPIDOGREL BISULFATE 75 MG PO TABS: 75.0000 mg | ORAL_TABLET | Freq: Every day | ORAL | Status: DC

## 2013-05-05 MED ORDER — MIDAZOLAM HCL 5 MG/5ML IJ SOLN
INTRAMUSCULAR | Status: DC | PRN
  Administered 2013-05-05: 1.5 mg via INTRAVENOUS
  Administered 2013-05-05: 1 mg via INTRAVENOUS

## 2013-05-05 MED ORDER — MIDAZOLAM HCL 5 MG/5ML IJ SOLN
INTRAMUSCULAR | Status: AC
  Filled 2013-05-05: qty 10

## 2013-05-05 MED ORDER — MEPERIDINE HCL 50 MG/ML IJ SOLN
INTRAMUSCULAR | Status: DC | PRN
  Administered 2013-05-05 (×2): 15 mg via INTRAVENOUS

## 2013-05-05 MED ORDER — STERILE WATER FOR IRRIGATION IR SOLN
Status: DC | PRN
  Administered 2013-05-05: 13:00:00

## 2013-05-05 MED ORDER — KETOROLAC TROMETHAMINE 30 MG/ML IJ SOLN
15.0000 mg | Freq: Four times a day (QID) | INTRAMUSCULAR | Status: DC | PRN
  Administered 2013-05-05: 30 mg via INTRAVENOUS
  Administered 2013-05-06: 15 mg via INTRAVENOUS
  Administered 2013-05-07: 30 mg via INTRAVENOUS
  Filled 2013-05-05 (×4): qty 1

## 2013-05-05 NOTE — Progress Notes (Signed)
[facility] PROGRESS NOTE  Christine Durham ZOX:[id number] DOB: [birth year] DOA: 04/29/2013 PCP: Ignatius Specking., MD  Assessment/Plan: #1 severe hypernatremia  Likely secondary to volume depletion and dehydration. Patient with poor oral intake secondary to dysphagia and recent strokes with failure to thrive. Sodium level improved with D5W. Currently at 145. Continue current IV fluids and follow.  #2 acute renal failure  Likely secondary to prerenal azotemia. Improved with hydration. Follow.  #3 bulbar erosions As noted during placement of PEG tube. Patient has been started on a PPI per gastroenterology. #4 dysphagia  Likely multifactorial secondary to multiple strokes and difficulty clearing secretions. Patient has been seen by speech therapy with no significant improvement. Patient is currently n.p.o. Per family patient has been unable to tolerate any oral intake for the past 2-3 weeks. No new focal deficits noted. Patient is status post PEG tube placement today. Will start tube feeds tomorrow. Appreciate GI input and recommendations.  #5 acute encephalopathy  Patient is more alert. Likely secondary to electrolyte disturbance of hyponatremia in the setting of multiple strokes and dementia. Patient is currently afebrile. Chest x-ray on admission was negative. No signs or symptoms of infection. Will follow closely with correction of sodium.  #6 history of ischemic stroke 9/ 2014 with an expressive aphasia.  Patient did have a history of extension of stroke October 2014 with dysphagia, dysarthria, right hemiplegia and aspiration pneumonia. Left hemispheric watershed distribution. No evidence of progression or neurological dysfunction. Continue aspirin. Patient is status post PEG tube placement for nutrition as she has significant dysphagia with inability to clear secretions appropriately. Will start tube feeds tomorrow.  #7 failure to thrive  Likely secondary to #3 and 5.  #8 COPD  Stable.  Continue nebs.  #9 chest congestion by history  Patient is afebrile. Chest x-ray on admission was negative. Monitor.  #10 generalized weakness  PT evaluation pending.  #11 dementia  Stable.    Code Status: Full Family Communication: Updated patient and sister and niece at bedside. Disposition Plan: SNF when medically stable.   Consultants: Speech therapy GI consult: Dr. Karilyn Cota 05/04/2013     Procedures: Chest x-ray 04/29/2013 PEG tube placement 05/05/2013 per Dr. Karilyn Cota     Antibiotics:  None  HPI/Subjective: Patient non verbal. Patient post PEG placement  Objective: Filed Vitals:   05/05/13 1315  BP:   Pulse: 112  Temp:   Resp: 30    Intake/Output Summary (Last 24 hours) at 05/05/13 1356 Last data filed at 05/05/13 0543  Gross per 24 hour  Intake   1810 ml  Output      0 ml  Net   1810 ml   Filed Weights   05/02/13 0323 05/04/13 0500 05/05/13 0500  Weight: 53.6 kg (118 lb 2.7 oz) 53 kg (116 lb 13.5 oz) 52.8 kg (116 lb 6.5 oz)    Exam:   General:  NAD  Cardiovascular: RRR  Respiratory: CTAB  Abdomen: Soft/NT/ND/+BS  Musculoskeletal: No c/c/e  Data Reviewed: Basic Metabolic Panel:  Recent Labs Lab 05/01/13 1655 05/02/13 0629 05/03/13 0522 05/04/13 0617 05/05/13 0614  NA 152* 146* 143 143 145  K 3.1* 3.2* 3.5 3.5 3.6  CL 115* 109 105 108 111  CO2 30 29 28 28 24   GLUCOSE 278* 247* 319* 262* 219*  BUN 14 9 5* 6 6  CREATININE 0.79 0.75 0.77 0.77 0.70  CALCIUM 9.7 9.5 9.7 9.4 9.7   Liver Function Tests:  Recent Labs Lab 04/29/13 1647 04/30/13 0750  AST 16  18  ALT 17 15  ALKPHOS 134* 109  BILITOT 0.4 0.3  PROT 8.6* 6.9  ALBUMIN 4.1 3.2*   No results found for this basename: LIPASE, AMYLASE,  in the last 168 hours No results found for this basename: AMMONIA,  in the last 168 hours CBC:  Recent Labs Lab 04/29/13 1647 05/02/13 0629 05/03/13 0522 05/04/13 0617 05/05/13 0614  WBC 12.6* 9.9 10.9* 10.4 10.1  NEUTROABS  8.7*  --   --   --   --   HGB 13.7 11.2* 11.0* 10.7* 11.0*  HCT 42.8 35.3* 33.8* 33.0* 33.5*  MCV 93.7 92.4 90.1 89.7 89.3  PLT 304 197 197 188 191   Cardiac Enzymes: No results found for this basename: CKTOTAL, CKMB, CKMBINDEX, TROPONINI,  in the last 168 hours BNP (last 3 results)  Recent Labs  06/26/12 2320  PROBNP 150.2*   CBG:  Recent Labs Lab 05/05/13 0020 05/05/13 0404 05/05/13 0739 05/05/13 1008 05/05/13 1133  GLUCAP 128* 199* 189* 177* 172*    Recent Results (from the past 240 hour(s))  MRSA PCR SCREENING     Status: None   Collection Time    04/29/13 11:01 PM      Result Value Range Status   MRSA by PCR NEGATIVE  NEGATIVE Final   Comment:            The GeneXpert MRSA Assay (FDA     approved for NASAL specimens     only), is one component of a     comprehensive MRSA colonization     surveillance program. It is not     intended to diagnose MRSA     infection nor to guide or     monitor treatment for     MRSA infections.  URINE CULTURE     Status: None   Collection Time    05/01/13  4:40 PM      Result Value Range Status   Specimen Description URINE, CLEAN CATCH   Final   Special Requests NONE   Final   Culture  Setup Time     Final   Value: 05/02/2013 00:29     Performed at [facility] Count     Final   Value: NO GROWTH     Performed at [facility]   Culture     Final   Value: NO GROWTH     Performed at [facility]   Report Status 05/02/2013 FINAL   Final  CULTURE, BLOOD (ROUTINE X 2)     Status: None   Collection Time    05/04/13 12:02 AM      Result Value Range Status   Specimen Description BLOOD RIGHT HAND   Final   Special Requests     Final   Value: BOTTLES DRAWN AEROBIC AND ANAEROBIC AEB=6CC ANA=5CC   Culture NO GROWTH 1 DAY   Final   Report Status PENDING   Incomplete  CULTURE, BLOOD (ROUTINE X 2)     Status: None   Collection Time    05/04/13 12:02 AM      Result Value Range Status   Specimen  Description BLOOD LEFT HAND   Final   Special Requests     Final   Value: BOTTLES DRAWN AEROBIC AND ANAEROBIC AEB=6CC ANA=5CC   Culture NO GROWTH 1 DAY   Final   Report Status PENDING   Incomplete     Studies: No results found.  Scheduled Meds: . [facility]  HOLD] antiseptic oral rinse  15 mL Mouth Rinse q12n4p  . [facility] HOLD] aspirin  300 mg Rectal Daily  . [facility] HOLD] chlorhexidine  15 mL Mouth Rinse BID  . [MAR HOLD] insulin aspart  0-15 Units Subcutaneous Q4H  . meperidine      . midazolam      . pantoprazole  40 mg Oral Daily  . [facility] HOLD] sodium chloride  3 mL Intravenous Q12H   Continuous Infusions: . sodium chloride 20 mL/hr at 05/05/13 1141  . dextrose 1,000 mL (05/05/13 1034)  . sodium chloride irrigation      Principal Problem:   Hypernatremia Active Problems:   Diabetes mellitus without complication   Dysphagia   Acute encephalopathy   Acute renal failure   Generalized weakness   Adult failure to thrive   Protein-calorie malnutrition, severe    Time spent: > 30 mins    [facility]  [facility] Pager [phone]. If 7PM-7AM, please contact night-coverage at [email or web address], password [facility] 05/05/2013, 1:56 PM  LOS: 6 days

## 2013-05-05 NOTE — Clinical Social Work Placement (Signed)
Clinical Social Work Department CLINICAL SOCIAL WORK PLACEMENT NOTE 05/05/2013  Patient:  Christine Durham, Christine Durham  Account Number:  [id number] Admit date:  04/29/2013  Clinical Social Worker:  Derenda Fennel, LCSW  Date/time:  05/05/2013 01:55 PM  Clinical Social Work is seeking post-discharge placement for this patient at the following level of care:   SKILLED NURSING   (*CSW will update this form in Epic as items are completed)   05/05/2013  Patient/family provided with [facility] Department of Clinical Social Work's list of facilities offering this level of care within the geographic area requested by the patient (or if unable, by the patient's family).  05/05/2013  Patient/family informed of their freedom to choose among providers that offer the needed level of care, that participate in Medicare, Medicaid or managed care program needed by the patient, have an available bed and are willing to accept the patient.  05/05/2013  Patient/family informed of [facility]' ownership interest in [facility], as well as of the fact that they are under no obligation to receive care at this facility.  PASARR submitted to [employer]DS on  PASARR number received from E[employer] on   FL2 transmitted to all facilities in geographic area requested by pt/family on  05/05/2013 FL2 transmitted to all facilities within larger geographic area on   Patient informed that his/her managed care company has contracts with or will negotiate with  certain facilities, including the following:     Patient/family informed of bed offers received:   Patient chooses bed at  Physician recommends and patient chooses bed at    Patient to be transferred to  on   Patient to be transferred to facility by   The following physician request were entered in Epic:   Additional Comments: Pt has existing pasarr number.  Derenda Fennel, [place] [phone]

## 2013-05-05 NOTE — Clinical Social Work Note (Signed)
CSW presented bed offers and pt's sister chooses [facility]. Possible d/c Monday per MD. CSW will continue to follow.  Derenda Fennel, [place] [phone]

## 2013-05-05 NOTE — Progress Notes (Signed)
Pt is undergoing PEG tube placement today.  We will be unable to do a PT evaluation because of this.

## 2013-05-05 NOTE — Clinical Social Work Placement (Signed)
Clinical Social Work Department CLINICAL SOCIAL WORK PLACEMENT NOTE 05/05/2013  Patient:  Christine Durham, Christine Durham  Account Number:  [id number] Admit date:  04/29/2013  Clinical Social Worker:  Derenda Fennel, LCSW  Date/time:  05/05/2013 01:55 PM  Clinical Social Work is seeking post-discharge placement for this patient at the following level of care:   SKILLED NURSING   (*CSW will update this form in Epic as items are completed)   05/05/2013  Patient/family provided with [facility] Department of Clinical Social Work's list of facilities offering this level of care within the geographic area requested by the patient (or if unable, by the patient's family).  05/05/2013  Patient/family informed of their freedom to choose among providers that offer the needed level of care, that participate in Medicare, Medicaid or managed care program needed by the patient, have an available bed and are willing to accept the patient.  05/05/2013  Patient/family informed of [facility]' ownership interest in [facility], as well as of the fact that they are under no obligation to receive care at this facility.  PASARR submitted to EDS on  PASARR number received from EDS on   FL2 transmitted to all facilities in geographic area requested by pt/family on  05/05/2013 FL2 transmitted to all facilities within larger geographic area on   Patient informed that his/her managed care company has contracts with or will negotiate with  certain facilities, including the following:     Patient/family informed of bed offers received:  05/05/2013 Patient chooses bed at [facility] Physician recommends and patient chooses bed at  [facility]  Patient to be transferred to  on   Patient to be transferred to facility by   The following physician request were entered in Epic:   Additional Comments: Pt has existing pasarr number.  Derenda Fennel, [place] [phone]

## 2013-05-05 NOTE — Clinical Social Work Psychosocial (Signed)
Clinical Social Work Department BRIEF PSYCHOSOCIAL ASSESSMENT 05/05/2013  Patient:  Christine Durham, Christine Durham     Account Number:  [id number]     Admit date:  04/29/2013  Clinical Social Worker:  Nancie Neas  Date/Time:  05/05/2013 01:55 PM  Referred by:  Care Management  Date Referred:  05/05/2013 Referred for  SNF Placement   Other Referral:   Interview type:  Family Other interview type:   Christine Durham- sister    PSYCHOSOCIAL DATA Living Status:  SIBLING Admitted from facility:   Level of care:   Primary support name:  Christine Durham Primary support relationship to patient:  SIBLING Degree of support available:   supportive    CURRENT CONCERNS Current Concerns  Post-Acute Placement   Other Concerns:    SOCIAL WORK ASSESSMENT / PLAN CSW met with pt's sister at bedside. Pt down having PEG tube placed now. Pt is well known to CSW from previous admissions. She was placed at [facility] before. Once pt completed 20 days of Medicare coverage she returned home with her sister. Christine Durham reports things have been difficult to manage at home as she is unable to lift her. Pt has one son who lives in [place]. After pt's stroke several years ago, she moved down here to stay with Christine Durham. Christine Durham reports that her son has deferred decisions to her as he lives out of town. She asked about HCPOA paperwork, but is aware that pt is not oriented at this point to complete. CSW discussed SNF placement again as she indicated to CM that she did not feel she could take pt back home in current condition. Aware that pt will enter SNF having to pay co-insurance which is $152 a day. Christine Durham said they can do that and spend down pt's account. She is willing to apply for Medicaid at that point if needed. SNF list provided.   Assessment/plan status:  Psychosocial Support/Ongoing Assessment of Needs Other assessment/ plan:   Information/referral to community resources:   SNF list    PATIENT'S/FAMILY'S RESPONSE TO PLAN  OF CARE: Pt not in room at this time. CSW faxed out new bed search and will follow up with offers when available.       Derenda Fennel, [place] [phone]

## 2013-05-05 NOTE — Op Note (Signed)
PROCEDURE REPORT  PATIENT:  Christine Durham  MR#:  [id number] Birthdate:  Sep 03, 1940, 72 y.o., female Endoscopist:  Dr. Malissa Hippo, MD Referred By:  Dr. Bonnetta Barry ref. provider found Procedure Date: 05/05/2013  Procedure:  Percutaneous endoscopic gastrostomy.  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 Informed consent for the procedure was obtained from her sister [name].            Informed Consent:  The risks, benefits, alternatives & imponderables which include, but are not limited to, bleeding, infection, perforation, drug reaction and potential missed lesion have been reviewed.  The potential for biopsy, lesion removal, esophageal dilation, etc. have also been discussed.  Questions have been answered.  All parties agreeable.  Please see history & physical in medical record for more information.  Medications:  Demerol 30 mg IV Versed 2.5 mg IV Cetacaine spray topically for oropharyngeal anesthesia  Description of procedure:  The endoscope was introduced through the mouth and advanced to the second portion of the duodenum without difficulty or limitations. The mucosal surfaces were surveyed very carefully during advancement of the scope and upon withdrawal.  Findings:  Esophagus:  Mucosa of the esophagus was normal.  GE junction was unremarkable. Stomach:  Stomach was empty and distended very well with insufflation. Folds in the proximal stomach were normal. Mucosa at gastric body, antrum, pyloric channel, angularis, fundus and cardia was normal. Duodenum:  Multiple bulbar erosions were noted. Post bulbar mucosa was normal.  Therapeutic/Diagnostic Maneuvers Performed:   Patient was left in supine position for PEG placement. Skin part was assisted by Ms. Rebeca Alert, RN. Stomach was insufflated with CO2. Site was marked for placement of G-tube it was good  transillumination and one-to-one movement on pushing across  abdominal wall. Skin was prepped in the usual fashion and isolated using sterile sheath. 1% Xylocaine was injected in the skin and subcutaneous tissue for topical anesthesia. 2.5 mL was used. Small skin deep incision was made with scalpel blade and 14-gauge Caldwell needle was introduced across abdominal wall into the gastric lumen on first attempt. Needle was removed and the 035 guidewire was advanced through the trocar and cough with snare already in place. Endoscope was withdrawn along with the guidewire. A 20 Fr Endovine gastrostomy tube was advanced over the guidewire and pulled until mushroom was felt to be resting against gastric mucosa. Endoscope was passed again. G-tube was in good position. Reading on the outside was 2.5 cm. On the outside G-tube was secured by placing bolster around it. As the scope was withdrawn a small mucosal disruption noted in cervical esophagus possibly indicative of a web. She site was covered with antibiotic and sterile dressing applied.  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 Erosive bulbar duodenitis. 20 [place] Endovine gastrostomy tube is in the gastric body for feeding purposes.  Recommendations:  Normal saline at a rate of 50 mL per hour via G-tube for 24 hours. Medications can be given guaiac G-tube as of today. Can resume clopidogrel in am. Pantoprazole 40 mg by mouth daily via G-tube. H. pylori serology. Abdominal binder for 5 days.  Christine Durham U  05/05/2013  1:17 PM  CC: Dr. Ignatius Specking., MD & Dr. Bonnetta Barry ref. provider found

## 2013-05-06 ENCOUNTER — Inpatient Hospital Stay (HOSPITAL_COMMUNITY): Payer: Medicare Other

## 2013-05-06 DIAGNOSIS — Z431 Encounter for attention to gastrostomy: Secondary | ICD-10-CM

## 2013-05-06 LAB — BASIC METABOLIC PANEL
BUN: 7 mg/dL (ref 6–23)
CO2: 24 mEq/L (ref 19–32)
Chloride: 104 mEq/L (ref 96–112)
Creatinine, Ser: 0.75 mg/dL (ref 0.50–1.10)
Glucose, Bld: 203 mg/dL — ABNORMAL HIGH (ref 70–99)
Potassium: 3 mEq/L — ABNORMAL LOW (ref 3.5–5.1)

## 2013-05-06 LAB — CBC
HCT: 30.4 % — ABNORMAL LOW (ref 36.0–46.0)
MCHC: 32.9 g/dL (ref 30.0–36.0)
MCV: 88.6 fL (ref 78.0–100.0)
RDW: 13.6 % (ref 11.5–15.5)
WBC: 9.4 10*3/uL (ref 4.0–10.5)

## 2013-05-06 LAB — URINE MICROSCOPIC-ADD ON

## 2013-05-06 LAB — GLUCOSE, CAPILLARY
Glucose-Capillary: 128 mg/dL — ABNORMAL HIGH (ref 70–99)
Glucose-Capillary: 161 mg/dL — ABNORMAL HIGH (ref 70–99)
Glucose-Capillary: 166 mg/dL — ABNORMAL HIGH (ref 70–99)
Glucose-Capillary: 177 mg/dL — ABNORMAL HIGH (ref 70–99)
Glucose-Capillary: 190 mg/dL — ABNORMAL HIGH (ref 70–99)

## 2013-05-06 LAB — URINALYSIS, ROUTINE W REFLEX MICROSCOPIC
Glucose, UA: NEGATIVE mg/dL
Hgb urine dipstick: NEGATIVE
Ketones, ur: NEGATIVE mg/dL
Nitrite: NEGATIVE
Specific Gravity, Urine: <1.005 — ABNORMAL LOW (ref 1.005–1.030)
Urobilinogen, UA: 0.2 mg/dL (ref 0.0–1.0)
pH: 6 (ref 5.0–8.0)

## 2013-05-06 MED ORDER — LABETALOL HCL 5 MG/ML IV SOLN
5.0000 mg | Freq: Once | INTRAVENOUS | Status: AC
  Administered 2013-05-06: 5 mg via INTRAVENOUS
  Filled 2013-05-06 (×2): qty 4

## 2013-05-06 MED ORDER — PANTOPRAZOLE SODIUM 40 MG PO PACK
40.0000 mg | PACK | Freq: Every day | ORAL | Status: DC
  Administered 2013-05-06 – 2013-05-09 (×4): 40 mg
  Filled 2013-05-06 (×7): qty 20

## 2013-05-06 MED ORDER — DEXTROSE 5 % IV SOLN
1.0000 g | INTRAVENOUS | Status: DC
  Administered 2013-05-06 – 2013-05-07 (×2): 1 g via INTRAVENOUS
  Filled 2013-05-06 (×2): qty 10

## 2013-05-06 MED ORDER — FREE WATER
200.0000 mL | Freq: Four times a day (QID) | Status: DC
  Administered 2013-05-06 – 2013-05-08 (×7): 200 mL

## 2013-05-06 MED ORDER — SODIUM CHLORIDE 0.9 % IV SOLN: INTRAVENOUS | Status: DC

## 2013-05-06 MED ORDER — INSULIN ASPART 100 UNIT/ML ~~LOC~~ SOLN
0.0000 [IU] | SUBCUTANEOUS | Status: DC
Start: 2013-05-06 — End: 2013-05-09
  Administered 2013-05-06: 3 [IU] via SUBCUTANEOUS
  Administered 2013-05-06: 2 [IU] via SUBCUTANEOUS
  Administered 2013-05-06 – 2013-05-07 (×5): 3 [IU] via SUBCUTANEOUS
  Administered 2013-05-07: 2 [IU] via SUBCUTANEOUS
  Administered 2013-05-07 – 2013-05-08 (×2): 3 [IU] via SUBCUTANEOUS
  Administered 2013-05-08 (×2): 2 [IU] via SUBCUTANEOUS
  Administered 2013-05-08: 3 [IU] via SUBCUTANEOUS
  Administered 2013-05-08: 2 [IU] via SUBCUTANEOUS
  Administered 2013-05-08: 3 [IU] via SUBCUTANEOUS
  Administered 2013-05-09: 5 [IU] via SUBCUTANEOUS
  Administered 2013-05-09: 3 [IU] via SUBCUTANEOUS
  Administered 2013-05-09: 5 [IU] via SUBCUTANEOUS
  Administered 2013-05-09: 3 [IU] via SUBCUTANEOUS

## 2013-05-06 MED ORDER — ASPIRIN 81 MG PO CHEW
81.0000 mg | CHEWABLE_TABLET | Freq: Every day | ORAL | Status: DC
  Administered 2013-05-06 – 2013-05-09 (×4): 81 mg
  Filled 2013-05-06 (×4): qty 1

## 2013-05-06 MED ORDER — CLOPIDOGREL BISULFATE 75 MG PO TABS
75.0000 mg | ORAL_TABLET | Freq: Every day | ORAL | Status: DC
  Administered 2013-05-06 – 2013-05-09 (×4): 75 mg
  Filled 2013-05-06 (×4): qty 1

## 2013-05-06 MED ORDER — PRO-STAT SUGAR FREE PO LIQD
30.0000 mL | Freq: Three times a day (TID) | ORAL | Status: DC
  Administered 2013-05-06 – 2013-05-08 (×5): 30 mL
  Filled 2013-05-06 (×5): qty 30

## 2013-05-06 MED ORDER — AMLODIPINE 1 MG/ML ORAL SUSPENSION
10.0000 mg | Freq: Every day | ORAL | Status: DC
  Filled 2013-05-06 (×3): qty 10

## 2013-05-06 MED ORDER — PRO-STAT SUGAR FREE PO LIQD: 30.0000 mL | Freq: Three times a day (TID) | ORAL | Status: DC

## 2013-05-06 MED ORDER — GLUCERNA 1.2 CAL PO LIQD
1000.0000 mL | ORAL | Status: DC
  Administered 2013-05-06 – 2013-05-07 (×2): 1000 mL
  Filled 2013-05-06 (×3): qty 1000

## 2013-05-06 MED ORDER — POTASSIUM CHLORIDE IN NACL 40-0.9 MEQ/L-% IV SOLN
INTRAVENOUS | Status: DC
  Administered 2013-05-06: 09:00:00 via INTRAVENOUS
  Administered 2013-05-07: 50 mL/h via INTRAVENOUS

## 2013-05-06 MED ORDER — POTASSIUM CHLORIDE 20 MEQ/15ML (10%) PO LIQD
40.0000 meq | ORAL | Status: AC
  Administered 2013-05-06 (×2): 40 meq
  Filled 2013-05-06 (×2): qty 30

## 2013-05-06 MED ORDER — AMLODIPINE BESYLATE 5 MG PO TABS
10.0000 mg | ORAL_TABLET | Freq: Every day | ORAL | Status: DC
  Administered 2013-05-06 – 2013-05-09 (×4): 10 mg
  Filled 2013-05-06 (×5): qty 2

## 2013-05-06 MED ORDER — PANTOPRAZOLE SODIUM 40 MG PO TBEC: 40.0000 mg | DELAYED_RELEASE_TABLET | Freq: Every day | ORAL | Status: DC

## 2013-05-06 MED ORDER — DEXTROSE 5 % IV SOLN
INTRAVENOUS | Status: AC
  Filled 2013-05-06: qty 10

## 2013-05-06 NOTE — Progress Notes (Signed)
[facility] PROGRESS NOTE  Christine Durham ZDG:[id number] DOB: 01-29-1941 DOA: 04/29/2013 PCP: Ignatius Specking., MD  Assessment/Plan: #1 severe hypernatremia  Likely secondary to volume depletion and dehydration. Patient with poor oral intake secondary to dysphagia and recent strokes with failure to thrive. Sodium level improved with D5W. Currently at 138. Change IV fluids. #2 acute renal failure  Likely secondary to prerenal azotemia. Improved with hydration. Follow.  #3 bulbar erosions As noted during placement of PEG tube. Patient has been started on a PPI per gastroenterology. #4 dysphagia status post PEG placement Likely multifactorial secondary to multiple strokes and difficulty clearing secretions. Patient has been seen by speech therapy with no significant improvement. Patient is currently n.p.o. Per family patient has been unable to tolerate any oral intake for the past 2-3 weeks. No new focal deficits noted. Patient is status post PEG tube placement today.  2 these have been started per GI today.nutritionist pending for further adjustments to her regimen.  The GI external bumper was continue abdominal binder. Appreciate GI input and recommendations.  #5 acute encephalopathy  Patient is more alert. Likely secondary to electrolyte disturbance of hyponatremia in the setting of multiple strokes and dementia. Patient is currently afebrile. Chest x-ray on admission was negative. No signs or symptoms of infection. Patient likely close to baseline.  #6 history of ischemic stroke 9/ 2014 with an expressive aphasia.  Patient did have a history of extension of stroke October 2014 with dysphagia, dysarthria, right hemiplegia and aspiration pneumonia. Left hemispheric watershed distribution. No evidence of progression or neurological dysfunction. Continue aspirin. Patient is status post PEG tube placement for nutrition as she has significant dysphagia with inability to clear secretions  appropriately. She feeds to be started today. Will resume aspirin and Plavix. #7 failure to thrive  Likely secondary to #3 and 5.  #8 COPD  Stable. Continue nebs.  #9 chest congestion by history  Patient is afebrile. Chest x-ray on admission was negative. Place on Claritin. Monitor.  #10 generalized weakness  PT evaluation pending.  #11 dementia  Stable.  #12 hypokalemia Replete.    Code Status: Full Family Communication: Updated patient no family at bedside. Disposition Plan: SNF when medically stable.   Consultants: Speech therapy GI consult: Dr. Karilyn Cota 05/04/2013     Procedures: Chest x-ray 04/29/2013 PEG tube placement 05/05/2013 per Dr. Karilyn Cota     Antibiotics:  None  HPI/Subjective: Patient non verbal. Patient post PEG placement  Objective: Filed Vitals:   05/06/13 0606  BP: 147/72  Pulse: 89  Temp: 98 F (36.7 C)  Resp: 20    Intake/Output Summary (Last 24 hours) at 05/06/13 1159 Last data filed at 05/06/13 0800  Gross per 24 hour  Intake      0 ml  Output      0 ml  Net      0 ml   Filed Weights   05/04/13 0500 05/05/13 0500 05/06/13 0500  Weight: 53 kg (116 lb 13.5 oz) 52.8 kg (116 lb 6.5 oz) 53.6 kg (118 lb 2.7 oz)    Exam:   General:  NAD  Cardiovascular: RRR  Respiratory: Coarse rhonchorous breath sounds in the anterior lung fields.  Abdomen: Soft/NT/ND/+BS  Musculoskeletal: No c/c/e  Data Reviewed: Basic Metabolic Panel:  Recent Labs Lab 05/02/13 0629 05/03/13 0522 05/04/13 0617 05/05/13 0614 05/06/13 0703  NA 146* 143 143 145 138  K 3.2* 3.5 3.5 3.6 3.0*  CL 109 105 108 111 104  CO2 29 28 28 24  24  GLUCOSE 247* 319* 262* 219* 203*  BUN 9 5* 6 6 7   CREATININE 0.75 0.77 0.77 0.70 0.75  CALCIUM 9.5 9.7 9.4 9.7 9.5   Liver Function Tests:  Recent Labs Lab 04/29/13 1647 04/30/13 0750  AST 16 18  ALT 17 15  ALKPHOS 134* 109  BILITOT 0.4 0.3  PROT 8.6* 6.9  ALBUMIN 4.1 3.2*   No results found for this  basename: LIPASE, AMYLASE,  in the last 168 hours No results found for this basename: AMMONIA,  in the last 168 hours CBC:  Recent Labs Lab 04/29/13 1647 05/02/13 0629 05/03/13 0522 05/04/13 0617 05/05/13 0614 05/06/13 0703  WBC 12.6* 9.9 10.9* 10.4 10.1 9.4  NEUTROABS 8.7*  --   --   --   --   --   HGB 13.7 11.2* 11.0* 10.7* 11.0* 10.0*  HCT 42.8 35.3* 33.8* 33.0* 33.5* 30.4*  MCV 93.7 92.4 90.1 89.7 89.3 88.6  PLT 304 197 197 188 191 207   Cardiac Enzymes: No results found for this basename: CKTOTAL, CKMB, CKMBINDEX, TROPONINI,  in the last 168 hours BNP (last 3 results)  Recent Labs  06/26/12 2320  PROBNP 150.2*   CBG:  Recent Labs Lab 05/05/13 1711 05/05/13 2044 05/06/13 0038 05/06/13 0500 05/06/13 0908  GLUCAP 220* 177* 87 177* 161*    Recent Results (from the past 240 hour(s))  MRSA PCR SCREENING     Status: None   Collection Time    04/29/13 11:01 PM      Result Value Range Status   MRSA by PCR NEGATIVE  NEGATIVE Final   Comment:            The GeneXpert MRSA Assay (FDA     approved for NASAL specimens     only), is one component of a     comprehensive MRSA colonization     surveillance program. It is not     intended to diagnose MRSA     infection nor to guide or     monitor treatment for     MRSA infections.  URINE CULTURE     Status: None   Collection Time    05/01/13  4:40 PM      Result Value Range Status   Specimen Description URINE, CLEAN CATCH   Final   Special Requests NONE   Final   Culture  Setup Time     Final   Value: 05/02/2013 00:29     Performed at [facility] Count     Final   Value: NO GROWTH     Performed at [facility]   Culture     Final   Value: NO GROWTH     Performed at [facility]   Report Status 05/02/2013 FINAL   Final  CULTURE, BLOOD (ROUTINE X 2)     Status: None   Collection Time    05/04/13 12:02 AM      Result Value Range Status   Specimen Description BLOOD RIGHT HAND    Final   Special Requests     Final   Value: BOTTLES DRAWN AEROBIC AND ANAEROBIC AEB=6CC ANA=5CC   Culture NO GROWTH 1 DAY   Final   Report Status PENDING   Incomplete  CULTURE, BLOOD (ROUTINE X 2)     Status: None   Collection Time    05/04/13 12:02 AM      Result Value Range Status   Specimen Description BLOOD LEFT HAND  Final   Special Requests     Final   Value: BOTTLES DRAWN AEROBIC AND ANAEROBIC AEB=6CC ANA=5CC   Culture NO GROWTH 1 DAY   Final   Report Status PENDING   Incomplete     Studies: No results found.  Scheduled Meds: . amLODipine  10 mg Per Tube Daily  . antiseptic oral rinse  15 mL Mouth Rinse q12n4p  . aspirin  81 mg Per Tube Daily  . chlorhexidine  15 mL Mouth Rinse BID  . [START ON 05/07/2013] clopidogrel  75 mg Per Tube Q breakfast  . feeding supplement (GLUCERNA 1.2 CAL)  1,000 mL Per Tube Q24H  . free water  200 mL Per Tube Q6H  . insulin aspart  0-15 Units Subcutaneous Q4H  . pantoprazole sodium  40 mg Per Tube Daily  . potassium chloride  40 mEq Per Tube Q4H  . sodium chloride  3 mL Intravenous Q12H   Continuous Infusions: . 0.9 % NaCl with KCl 40 mEq / L 75 mL/hr at 05/06/13 0907  . sodium chloride irrigation 50 mL (05/05/13 1554)    Principal Problem:   Hypernatremia Active Problems:   Diabetes mellitus without complication   Dysphagia   Acute encephalopathy   Acute renal failure   Generalized weakness   Adult failure to thrive   Protein-calorie malnutrition, severe    Time spent: > 30 mins    [facility]  [facility] Pager [phone]. If 7PM-7AM, please contact night-coverage at [email or web address], password [facility] 05/06/2013, 11:59 AM  LOS: 7 days

## 2013-05-06 NOTE — Progress Notes (Signed)
n  Nursing staff reports no problems with PEG tube. Saline infusing at 50 cc per hour. Residuals 50 cc.    Vital signs in last 24 hours: Temp:  [98 F (36.7 C)-98.7 F (37.1 C)] 98 F (36.7 C) (10/25 0606) Pulse Rate:  [84-125] 89 (10/25 0606) Resp:  [18-30] 20 (10/25 0606) BP: (123-199)/(57-113) 147/72 mmHg (10/25 0606) SpO2:  [92 %-100 %] 94 % (10/25 0606) Weight:  [118 lb 2.7 oz (53.6 kg)] 118 lb 2.7 oz (53.6 kg) (10/25 0500) Last BM Date: 05/04/13 General:   Awake, appears to be in no acute distress. Abdomen:  Abdominal binder in place. PEG tube looks good; external bumper appropriately snug at the 3 cm mark  one day following PEG placement. The abdomen is soft and nontender Extremities:  Without clubbing or edema.    Recent Labs  05/04/13 0617 05/05/13 0614 05/06/13 0703  WBC 10.4 10.1 9.4  HGB 10.7* 11.0* 10.0*  HCT 33.0* 33.5* 30.4*  PLT 188 191 207   BMET  Recent Labs  05/04/13 0617 05/05/13 0614 05/06/13 0703  NA 143 145 138  K 3.5 3.6 3.0*  CL 108 111 104  CO2 28 24 24   GLUCOSE 262* 219* 203*  BUN 6 6 7   CREATININE 0.77 0.70 0.75  CALCIUM 9.4 9.7 9.5    Impression:    Patient is stable one day following a PEG placement. PEG site looks good.  Recommendations:  Begin Glucerna full-strength 25 cc per hour. Free water 200 cc every 6 hours. Plan for a nutritionist to come by tomorrow or the 27th to make further adjustments in her regimen. External bumper to be loosened by 1 cm tomorrow. Discussed with nursing staff and Dr. Janee Morn.

## 2013-05-06 NOTE — Progress Notes (Addendum)
Nutrition follow-up   INTERVENTION:  PEG tube placed. Glucerna 1.2 is infusing @ 25 ml/hr. Add 30 ml Prostat via tube TID. Tube feeding regimen currently providing 1020 kcal, 81 grams protein, and 483 ml H2O.  Will cautiously advance to goal rate due to pt refeeding risk.  Free water 200 q 6 hr provides additional 800 ml water daily.  Monitor magnesium, potassium, and phosphorus daily for at least 3 days, MD to replete as needed, as pt is at risk for refeeding syndrome given her prior hypocaloric intake.  Follow on Monday for additional recommendations.  Initiate Adult Enteral Nutrition Protocol  NUTRITION DIAGNOSIS: Inadequate oral intake related to stroke, progressive dysphagia; ongoing as evidenced by NPO for safety per ST recommendations.  Goal: Pt to meet >/= 90% of their estimated nutrition needs; progressing   Monitor:  Enteral advancement and tolerance, labs and weight changes  Reason for Assessment: TF management   72 y.o. female  Admitting Dx: Hypernatremia  ASSESSMENT: Pt is s/p PEG placement 20 fr. LUQ yesterday afternoon 10/24 by Dr. Karilyn Cota. Tolerated NS @ 50 ml/hr and now enteral feeding has been initiated. Pt receiving continuous Glucerna 1.2 @ 25 ml/hr via PEG (provides 720 kcal, 36 gr protein and 483 ml water q 24 hr.  Patient Active Problem List   Diagnosis Date Noted  . Protein-calorie malnutrition, severe 05/01/2013  . Acute encephalopathy 04/30/2013  . Acute renal failure 04/30/2013  . Hypernatremia 04/30/2013  . Generalized weakness 04/30/2013  . Adult failure to thrive 04/30/2013  . possible Aspiration pneumonia 04/13/2013  . 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 Acute ischemic stroke 03/22/2013  . Dysphagia 03/22/2013  . UTI (urinary tract infection) 03/22/2013  . COPD (chronic obstructive pulmonary disease) 07/04/2012  . Physical deconditioning 07/04/2012  . CVA (cerebral infarction)  07/04/2012  . Asthma   . Tobacco abuse 07/01/2012  . Acute respiratory failure 06/28/2012  . NICM (nonischemic cardiomyopathy) 06/28/2012  . Hypotension 06/28/2012  . Non-ST elevation myocardial infarction (NSTEMI), initial care episode - Type 2 secondary to respiratory issues 06/28/2012  . Diabetes mellitus without complication   . Acute on chronic systolic CHF (congestive heart failure) 06/27/2012   Height: Ht Readings from Last 1 Encounters:  04/30/13 5\' 7"  (1.702 m)    Weight status: Wt Readings from Last 1 Encounters:  05/06/13 118 lb 2.7 oz (53.6 kg)  admit wt 10/18- 114.10# (52kg)  Ideal Body Weight: 135# (61.3 kg)  % Ideal Body Weight: 86%  Wt Readings from Last 10 Encounters:  05/06/13 118 lb 2.7 oz (53.6 kg)  05/06/13 118 lb 2.7 oz (53.6 kg)  04/12/13 123 lb 10.9 oz (56.1 kg)  04/10/13 133 lb (60.328 kg)  04/10/13 133 lb (60.328 kg)  03/29/13 128 lb (58.06 kg)  03/25/13 128 lb 14.4 oz (58.469 kg)  07/04/12 122 lb 12.7 oz (55.7 kg)  07/04/12 129 lb 8 oz (58.741 kg)  07/04/12 129 lb 8 oz (58.741 kg)    Usual Body Weight: 130-133#  BMI:  Body mass index is 18.5 kg/(m^2).borderline underweight  Estimated Nutritional Needs: Kcal: 1590-1855 Protein: 70-80 gr Fluid: 1600 ml /day (normal needs)  Skin: intact  Diet Order: NPO  EDUCATION NEEDS: -Education not appropriate at this time   Intake/Output Summary (Last 24 hours) at 05/06/13 1215 Last data filed at 05/06/13 1208  Gross per 24 hour  Intake 226.25 ml  Output      0 ml  Net 226.25 ml  Last BM: 05/04/13  Labs:   Recent Labs Lab 05/04/13 0617 05/05/13 0614 05/06/13 0703  NA 143 145 138  K 3.5 3.6 3.0*  CL 108 111 104  CO2 28 24 24   BUN 6 6 7   CREATININE 0.77 0.70 0.75  CALCIUM 9.4 9.7 9.5  GLUCOSE 262* 219* 203*    CBG (last 3)   Recent Labs  05/06/13 0500 05/06/13 0908 05/06/13 1153  GLUCAP 177* 161* 128*    Scheduled Meds: . amLODipine  10 mg Per Tube Daily  .  antiseptic oral rinse  15 mL Mouth Rinse q12n4p  . aspirin  81 mg Per Tube Daily  . chlorhexidine  15 mL Mouth Rinse BID  . [START ON 05/07/2013] clopidogrel  75 mg Per Tube Q breakfast  . feeding supplement (GLUCERNA 1.2 CAL)  1,000 mL Per Tube Q24H  . free water  200 mL Per Tube Q6H  . insulin aspart  0-15 Units Subcutaneous Q4H  . pantoprazole sodium  40 mg Per Tube Daily  . sodium chloride  3 mL Intravenous Q12H    Continuous Infusions: . 0.9 % NaCl with KCl 40 mEq / L 75 mL/hr at 05/06/13 0907  . sodium chloride irrigation 50 mL (05/05/13 1554)    Past Medical History  Diagnosis Date  . 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 Diabetes mellitus without complication   . Asthma   . HTN (hypertension)   . Tobacco abuse   . 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 COPD (chronic obstructive pulmonary disease)     Past Surgical History  Procedure Laterality Date  . Appendectomy      Royann Shivers MS,RD,LDN,CSG Office: [phone] Pager: [phone]

## 2013-05-06 NOTE — Progress Notes (Signed)
MD notified of abnormal VS. Orders placed.

## 2013-05-07 LAB — BASIC METABOLIC PANEL
BUN: 10 mg/dL (ref 6–23)
Calcium: 9.2 mg/dL (ref 8.4–10.5)
Chloride: 113 mEq/L — ABNORMAL HIGH (ref 96–112)
Creatinine, Ser: 0.85 mg/dL (ref 0.50–1.10)
GFR calc Af Amer: 79 mL/min — ABNORMAL LOW (ref >90)
Potassium: 4.2 mEq/L (ref 3.5–5.1)

## 2013-05-07 LAB — CBC
HCT: 28.7 % — ABNORMAL LOW (ref 36.0–46.0)
MCHC: 32.4 g/dL (ref 30.0–36.0)
MCV: 89.7 fL (ref 78.0–100.0)
Platelets: 218 10*3/uL (ref 150–400)
RBC: 3.2 MIL/uL — ABNORMAL LOW (ref 3.87–5.11)
RDW: 14 % (ref 11.5–15.5)
WBC: 9.4 10*3/uL (ref 4.0–10.5)

## 2013-05-07 LAB — GLUCOSE, CAPILLARY
Glucose-Capillary: 160 mg/dL — ABNORMAL HIGH (ref 70–99)
Glucose-Capillary: 154 mg/dL — ABNORMAL HIGH (ref 70–99)
Glucose-Capillary: 141 mg/dL — ABNORMAL HIGH (ref 70–99)
Glucose-Capillary: 170 mg/dL — ABNORMAL HIGH (ref 70–99)

## 2013-05-07 NOTE — Progress Notes (Signed)
Pt was saturating 88% on room air. Pt would increase to 90% but then drop back to 88%. Pt was placed on 2L nasal cannula and pt was saturating around 94-96%. Will continue to monitor.

## 2013-05-07 NOTE — Progress Notes (Signed)
[facility] PROGRESS NOTE  Christine Durham ZOX:[id number] DOB: 14-Nov-1940 DOA: 04/29/2013 PCP: Ignatius Specking., MD  Assessment/Plan: #1 severe hypernatremia  Likely secondary to volume depletion and dehydration. Patient with poor oral intake secondary to dysphagia and recent strokes with failure to thrive. Sodium level improved. Normal saline lock IV fluids. Monitor with free water flushes. #2 acute renal failure  Likely secondary to prerenal azotemia. Improved with hydration. Follow.  #3 bulbar erosions As noted during placement of PEG tube. Patient has been started on a PPI per gastroenterology. #4 dysphagia status post PEG placement Likely multifactorial secondary to multiple strokes and difficulty clearing secretions. Patient has been seen by speech therapy with no significant improvement. Patient is currently n.p.o. Per family patient has been unable to tolerate any oral intake for the past 2-3 weeks. No new focal deficits noted. Patient is status post PEG tube placement today.  2 these have been started per GI today.nutritionist pending for further adjustments to her regimen.   Appreciate GI input and recommendations.  #5 acute encephalopathy  Likely secondary to electrolyte disturbance of hyponatremia in the setting of multiple strokes and dementia. Patient is currently afebrile. Chest x-ray on admission was negative. No signs or symptoms of infection. Patient likely close to baseline.  #6 history of ischemic stroke 9/ 2014 with an expressive aphasia.  Patient did have a history of extension of stroke October 2014 with dysphagia, dysarthria, right hemiplegia and aspiration pneumonia. Left hemispheric watershed distribution. No evidence of progression or neurological dysfunction. Continue aspirin. Patient is status post PEG tube placement for nutrition as she has significant dysphagia with inability to clear secretions appropriately. Tube feeds started. Continue aspirin and Plavix. #7  failure to thrive  Likely secondary to #3 and 5.  #8 COPD  Stable. Continue nebs.  #9 chest congestion by history  Patient is afebrile. Chest x-ray on admission was negative. Place on Claritin. Monitor.  #10 generalized weakness  PT evaluation pending.  #11 dementia  Stable.  #12 hypokalemia Repleted.    Code Status: Full Family Communication: No family at bedside. Disposition Plan: SNF when medically stable.   Consultants: Speech therapy GI consult: Dr. Karilyn Cota 05/04/2013     Procedures: Chest x-ray 04/29/2013 PEG tube placement 05/05/2013 per Dr. Karilyn Cota     Antibiotics:  IV Rocephin 05/06/2013  HPI/Subjective: Patient non verbal. Patient resting comfortably.  Objective: Filed Vitals:   05/07/13 0540  BP: 138/51  Pulse: 103  Temp: 98.9 F (37.2 C)  Resp: 20    Intake/Output Summary (Last 24 hours) at 05/07/13 1334 Last data filed at 05/07/13 1300  Gross per 24 hour  Intake 2324.58 ml  Output      0 ml  Net 2324.58 ml   Filed Weights   05/05/13 0500 05/06/13 0500 05/07/13 0411  Weight: 52.8 kg (116 lb 6.5 oz) 53.6 kg (118 lb 2.7 oz) 55 kg (121 lb 4.1 oz)    Exam:   General:  NAD  Cardiovascular: RRR  Respiratory: ctab in the anterior lung fields.  Abdomen: Soft/NT/ND/+BS  Musculoskeletal: No c/c/e  Data Reviewed: Basic Metabolic Panel:  Recent Labs Lab 05/03/13 0522 05/04/13 0617 05/05/13 0614 05/06/13 0703 05/07/13 0726  NA 143 143 145 138 145  K 3.5 3.5 3.6 3.0* 4.2  CL 105 108 111 104 113*  CO2 28 28 24 24 23   GLUCOSE 319* 262* 219* 203* 171*  BUN 5* 6 6 7 10   CREATININE 0.77 0.77 0.70 0.75 0.85  CALCIUM 9.7 9.4 9.7 9.5  9.2   Liver Function Tests: No results found for this basename: AST, ALT, ALKPHOS, BILITOT, PROT, ALBUMIN,  in the last 168 hours No results found for this basename: LIPASE, AMYLASE,  in the last 168 hours No results found for this basename: AMMONIA,  in the last 168 hours CBC:  Recent Labs Lab  05/03/13 0522 05/04/13 0617 05/05/13 0614 05/06/13 0703 05/07/13 0726  WBC 10.9* 10.4 10.1 9.4 9.4  HGB 11.0* 10.7* 11.0* 10.0* 9.3*  HCT 33.8* 33.0* 33.5* 30.4* 28.7*  MCV 90.1 89.7 89.3 88.6 89.7  PLT 197 188 191 207 218   Cardiac Enzymes: No results found for this basename: CKTOTAL, CKMB, CKMBINDEX, TROPONINI,  in the last 168 hours BNP (last 3 results)  Recent Labs  06/26/12 2320  PROBNP 150.2*   CBG:  Recent Labs Lab 05/06/13 2120 05/06/13 2357 05/07/13 0406 05/07/13 0729 05/07/13 1145  GLUCAP 166* 190* 160* 154* 156*    Recent Results (from the past 240 hour(s))  MRSA PCR SCREENING     Status: None   Collection Time    04/29/13 11:01 PM      Result Value Range Status   MRSA by PCR NEGATIVE  NEGATIVE Final   Comment:            The GeneXpert MRSA Assay (FDA     approved for NASAL specimens     only), is one component of a     comprehensive MRSA colonization     surveillance program. It is not     intended to diagnose MRSA     infection nor to guide or     monitor treatment for     MRSA infections.  URINE CULTURE     Status: None   Collection Time    05/01/13  4:40 PM      Result Value Range Status   Specimen Description URINE, CLEAN CATCH   Final   Special Requests NONE   Final   Culture  Setup Time     Final   Value: 05/02/2013 00:29     Performed at [facility] Count     Final   Value: NO GROWTH     Performed at [facility]   Culture     Final   Value: NO GROWTH     Performed at [facility]   Report Status 05/02/2013 FINAL   Final  CULTURE, BLOOD (ROUTINE X 2)     Status: None   Collection Time    05/04/13 12:02 AM      Result Value Range Status   Specimen Description BLOOD RIGHT HAND   Final   Special Requests     Final   Value: BOTTLES DRAWN AEROBIC AND ANAEROBIC AEB=6CC ANA=5CC   Culture NO GROWTH 1 DAY   Final   Report Status PENDING   Incomplete  CULTURE, BLOOD (ROUTINE X 2)     Status: None    Collection Time    05/04/13 12:02 AM      Result Value Range Status   Specimen Description BLOOD LEFT HAND   Final   Special Requests     Final   Value: BOTTLES DRAWN AEROBIC AND ANAEROBIC AEB=6CC ANA=5CC   Culture NO GROWTH 1 DAY   Final   Report Status PENDING   Incomplete     Studies: Dg Chest Port 1 View  05/06/2013   CLINICAL DATA:  Fever  EXAM: PORTABLE CHEST - 1 VIEW  COMPARISON:  04/29/2013  FINDINGS: Heart size and vascular pattern are normal. There is a background of mild to moderate interstitial prominence, which represents a change when compared to the prior study.  IMPRESSION: Bilateral interstitial prominence. In the absence of vascular congestion, and in the presence of fever, the possibility of atypical bilateral pneumonia be considered.   Electronically Signed   By: Esperanza Heir M.D.   On: 05/06/2013 18:35    Scheduled Meds: . amLODipine  10 mg Per Tube Daily  . antiseptic oral rinse  15 mL Mouth Rinse q12n4p  . aspirin  81 mg Per Tube Daily  . cefTRIAXone (ROCEPHIN)  IV  1 g Intravenous Q24H  . chlorhexidine  15 mL Mouth Rinse BID  . clopidogrel  75 mg Per Tube Q breakfast  . feeding supplement (GLUCERNA 1.2 CAL)  1,000 mL Per Tube Q24H  . feeding supplement (PRO-STAT SUGAR FREE 64)  30 mL Per Tube TID WC  . free water  200 mL Per Tube Q6H  . insulin aspart  0-15 Units Subcutaneous Q4H  . pantoprazole sodium  40 mg Per Tube Daily  . sodium chloride  3 mL Intravenous Q12H   Continuous Infusions: . sodium chloride irrigation 50 mL (05/05/13 1554)    Principal Problem:   Hypernatremia Active Problems:   Diabetes mellitus without complication   Dysphagia   Acute encephalopathy   Acute renal failure   Generalized weakness   Adult failure to thrive   Protein-calorie malnutrition, severe    Time spent: > 30 mins    [facility]  [facility] Pager [phone]. If 7PM-7AM, please contact night-coverage at [email or web address], password  [facility] 05/07/2013, 1:34 PM  LOS: 8 days

## 2013-05-07 NOTE — Progress Notes (Signed)
Patient non- verbal with me   Vital signs in last 24 hours: Temp:  [98 F (36.7 C)-100.1 F (37.8 C)] 98.9 F (37.2 C) (10/26 0540) Pulse Rate:  [98-126] 103 (10/26 0540) Resp:  [18-20] 20 (10/26 0540) BP: (111-202)/(51-94) 138/51 mmHg (10/26 0540) SpO2:  [88 %-100 %] 97 % (10/26 0540) Weight:  [121 lb 4.1 oz (55 kg)] 121 lb 4.1 oz (55 kg) (10/26 0411) Last BM Date: 05/07/13  Abdomen:  Lajoyce Corners remains in place. PEG site looks good. Tube looks good. I loosened PEG bumper about 1 cm (to the 4 cm mark)   Intake/Output from previous day: 10/25 0701 - 10/26 0700 In: 1599.2 [I.V.:1227.5; NG/GT:321.7; IV Piggyback:50] Out: -  Intake/Output this shift: Total I/O In: 435.8 [NG/GT:435.8] Out: -   Lab Results:  Recent Labs  05/05/13 0614 05/06/13 0703 05/07/13 0726  WBC 10.1 9.4 9.4  HGB 11.0* 10.0* 9.3*  HCT 33.5* 30.4* 28.7*  PLT 191 207 218   BMET  Recent Labs  05/05/13 0614 05/06/13 0703 05/07/13 0726  NA 145 138 145  K 3.6 3.0* 4.2  CL 111 104 113*  CO2 24 24 23   GLUCOSE 219* 203* 171*  BUN 6 7 10   CREATININE 0.70 0.75 0.85  CALCIUM 9.7 9.5 9.2   Impression:  PEG tube functioning well. External bumper loosened. I appreciate the help of the nutrition service. Downward drift in hemoglobin more likely delusional than anything else. I doubt any significant GI bleeding.  Recommendations:  Continue progression of tube feedings per the nutrition service. Continue to use a binder to protect the PEG tube.  Ongoing routine PEG care the

## 2013-05-08 LAB — BASIC METABOLIC PANEL
BUN: 12 mg/dL (ref 6–23)
CO2: 27 mEq/L (ref 19–32)
Calcium: 9.5 mg/dL (ref 8.4–10.5)
Sodium: 146 mEq/L — ABNORMAL HIGH (ref 135–145)

## 2013-05-08 LAB — H. PYLORI ANTIBODY, IGG: H Pylori IgG: 0.54 {ISR}

## 2013-05-08 LAB — GLUCOSE, CAPILLARY
Glucose-Capillary: 124 mg/dL — ABNORMAL HIGH (ref 70–99)
Glucose-Capillary: 131 mg/dL — ABNORMAL HIGH (ref 70–99)
Glucose-Capillary: 161 mg/dL — ABNORMAL HIGH (ref 70–99)
Glucose-Capillary: 183 mg/dL — ABNORMAL HIGH (ref 70–99)
Glucose-Capillary: 199 mg/dL — ABNORMAL HIGH (ref 70–99)

## 2013-05-08 LAB — CBC
MCHC: 32.8 g/dL (ref 30.0–36.0)
MCV: 90.8 fL (ref 78.0–100.0)
Platelets: 233 10*3/uL (ref 150–400)
RBC: 3.26 MIL/uL — ABNORMAL LOW (ref 3.87–5.11)
WBC: 8 10*3/uL (ref 4.0–10.5)

## 2013-05-08 LAB — URINE CULTURE
Colony Count: NO GROWTH
Culture: NO GROWTH

## 2013-05-08 MED ORDER — ADULT MULTIVITAMIN LIQUID CH
5.0000 mL | Freq: Every day | ORAL | Status: DC
  Administered 2013-05-08 – 2013-05-09 (×2): 5 mL
  Filled 2013-05-08 (×3): qty 5

## 2013-05-08 MED ORDER — CEFUROXIME AXETIL 250 MG/5ML PO SUSR
250.0000 mg | Freq: Two times a day (BID) | ORAL | Status: DC
  Filled 2013-05-08: qty 5

## 2013-05-08 MED ORDER — FREE WATER: 300.0000 mL | Freq: Four times a day (QID) | Status: DC

## 2013-05-08 MED ORDER — PRO-STAT SUGAR FREE PO LIQD
30.0000 mL | Freq: Every day | ORAL | Status: DC
  Administered 2013-05-08 – 2013-05-09 (×2): 30 mL
  Filled 2013-05-08: qty 30

## 2013-05-08 MED ORDER — GLUCERNA 1.2 CAL PO LIQD
1000.0000 mL | ORAL | Status: DC
  Filled 2013-05-08 (×3): qty 1000

## 2013-05-08 MED ORDER — FREE WATER
250.0000 mL | Freq: Four times a day (QID) | Status: DC
  Administered 2013-05-08 – 2013-05-09 (×5): 250 mL

## 2013-05-08 MED ORDER — SULFAMETHOXAZOLE-TRIMETHOPRIM 200-40 MG/5ML PO SUSP
20.0000 mL | Freq: Two times a day (BID) | ORAL | Status: DC
  Administered 2013-05-08 – 2013-05-09 (×3): 20 mL
  Filled 2013-05-08 (×5): qty 20

## 2013-05-08 NOTE — Clinical Social Work Note (Signed)
CSW updated PNC on pt. Increasing tube feedings. Possible d/c tomorrow. CSW will continue to follow.  Derenda Fennel, [place] [phone]

## 2013-05-08 NOTE — Progress Notes (Signed)
[facility] PROGRESS NOTE  Christine Durham ZOX:[id number] DOB: 03-23-1941 DOA: 04/29/2013 PCP: Ignatius Specking., MD  Assessment/Plan: #1 severe hypernatremia  Likely secondary to volume depletion and dehydration. Patient with poor oral intake secondary to dysphagia and recent strokes with failure to thrive. Sodium level improved. Normal saline lock IV fluids. Increase free water flushes to 250 cc every 6 hours. #2 acute renal failure  Likely secondary to prerenal azotemia. Improved with hydration. Follow.  #3 bulbar erosions As noted during placement of PEG tube. Continue PPI per gastroenterology. #4 dysphagia status post PEG placement Likely multifactorial secondary to multiple strokes and difficulty clearing secretions. Patient has been seen by speech therapy with no significant improvement. Patient is currently n.p.o. Per family patient has been unable to tolerate any oral intake for the past 2-3 weeks. No new focal deficits noted. Patient is status post PEG tube placement.  TF  have been started per GI and.nutritionist titrating to goal. Appreciate GI input and recommendations.  #5 acute encephalopathy  Likely secondary to electrolyte disturbance of hyponatremia in the setting of multiple strokes and dementia. Patient is currently afebrile. Chest x-ray on admission was negative. No signs or symptoms of infection. Patient likely close to baseline.  #6 history of ischemic stroke 9/ 2014 with an expressive aphasia.  Patient did have a history of extension of stroke October 2014 with dysphagia, dysarthria, right hemiplegia and aspiration pneumonia. Left hemispheric watershed distribution. No evidence of progression or neurological dysfunction. Continue aspirin. Patient is status post PEG tube placement for nutrition as she has significant dysphagia with inability to clear secretions appropriately. Tube feeds started. Continue aspirin and Plavix. #7 failure to thrive  Likely secondary to #3 and  5.  #8 COPD  Stable. Continue nebs.  #9 chest congestion by history  Patient is afebrile. Chest x-ray on admission was negative. Place on Claritin. Monitor.  #10 generalized weakness  PT evaluation pending.  #11 dementia  Stable.  #12 hypokalemia Repleted.    Code Status: Full Family Communication: No family at bedside. Disposition Plan: SNF when medically stable hopefully tomorrow.   Consultants: Speech therapy GI consult: Dr. Karilyn Cota 05/04/2013     Procedures: Chest x-ray 04/29/2013 PEG tube placement 05/05/2013 per Dr. Karilyn Cota     Antibiotics:  IV Rocephin 05/06/2013--->05/08/13  Oral bactrim 05/08/13  HPI/Subjective: Patient non verbal. Patient resting comfortably.  Objective: Filed Vitals:   05/08/13 0611  BP: 140/51  Pulse: 89  Temp: 98.2 F (36.8 C)  Resp: 20    Intake/Output Summary (Last 24 hours) at 05/08/13 1507 Last data filed at 05/07/13 1712  Gross per 24 hour  Intake 419.17 ml  Output      0 ml  Net 419.17 ml   Filed Weights   05/06/13 0500 05/07/13 0411 05/08/13 0417  Weight: 53.6 kg (118 lb 2.7 oz) 55 kg (121 lb 4.1 oz) 55.1 kg (121 lb 7.6 oz)    Exam:   General:  NAD  Cardiovascular: RRR  Respiratory: ctab in the anterior lung fields.  Abdomen: Soft/NT/ND/+BS  Musculoskeletal: No c/c/e  Data Reviewed: Basic Metabolic Panel:  Recent Labs Lab 05/04/13 0617 05/05/13 0614 05/06/13 0703 05/07/13 0726 05/08/13 0519  NA 143 145 138 145 146*  K 3.5 3.6 3.0* 4.2 3.5  CL 108 111 104 113* 111  CO2 28 24 24 23 27   GLUCOSE 262* 219* 203* 171* 167*  BUN 6 6 7 10 12   CREATININE 0.77 0.70 0.75 0.85 0.59  CALCIUM 9.4 9.7 9.5 9.2 9.5  Liver Function Tests: No results found for this basename: AST, ALT, ALKPHOS, BILITOT, PROT, ALBUMIN,  in the last 168 hours No results found for this basename: LIPASE, AMYLASE,  in the last 168 hours No results found for this basename: AMMONIA,  in the last 168 hours CBC:  Recent  Labs Lab 05/04/13 0617 05/05/13 0614 05/06/13 0703 05/07/13 0726 05/08/13 0519  WBC 10.4 10.1 9.4 9.4 8.0  HGB 10.7* 11.0* 10.0* 9.3* 9.7*  HCT 33.0* 33.5* 30.4* 28.7* 29.6*  MCV 89.7 89.3 88.6 89.7 90.8  PLT 188 191 207 218 233   Cardiac Enzymes: No results found for this basename: CKTOTAL, CKMB, CKMBINDEX, TROPONINI,  in the last 168 hours BNP (last 3 results)  Recent Labs  06/26/12 2320  PROBNP 150.2*   CBG:  Recent Labs Lab 05/07/13 2008 05/08/13 0037 05/08/13 0338 05/08/13 0754 05/08/13 1158  GLUCAP 170* 124* 131* 183* 199*    Recent Results (from the past 240 hour(s))  MRSA PCR SCREENING     Status: None   Collection Time    04/29/13 11:01 PM      Result Value Range Status   MRSA by PCR NEGATIVE  NEGATIVE Final   Comment:            The GeneXpert MRSA Assay (FDA     approved for NASAL specimens     only), is one component of a     comprehensive MRSA colonization     surveillance program. It is not     intended to diagnose MRSA     infection nor to guide or     monitor treatment for     MRSA infections.  URINE CULTURE     Status: None   Collection Time    05/01/13  4:40 PM      Result Value Range Status   Specimen Description URINE, CLEAN CATCH   Final   Special Requests NONE   Final   Culture  Setup Time     Final   Value: 05/02/2013 00:29     Performed at [facility] Count     Final   Value: NO GROWTH     Performed at [facility]   Culture     Final   Value: NO GROWTH     Performed at [facility]   Report Status 05/02/2013 FINAL   Final  CULTURE, BLOOD (ROUTINE X 2)     Status: None   Collection Time    05/04/13 12:02 AM      Result Value Range Status   Specimen Description BLOOD RIGHT HAND   Final   Special Requests     Final   Value: BOTTLES DRAWN AEROBIC AND ANAEROBIC AEB=6CC ANA=5CC   Culture NO GROWTH 4 DAYS   Final   Report Status PENDING   Incomplete  CULTURE, BLOOD (ROUTINE X 2)     Status:  None   Collection Time    05/04/13 12:02 AM      Result Value Range Status   Specimen Description BLOOD LEFT HAND   Final   Special Requests     Final   Value: BOTTLES DRAWN AEROBIC AND ANAEROBIC AEB=6CC ANA=5CC   Culture NO GROWTH 4 DAYS   Final   Report Status PENDING   Incomplete  URINE CULTURE     Status: None   Collection Time    05/06/13  2:51 PM      Result Value Range Status  Specimen Description URINE, CATHETERIZED   Final   Special Requests NONE   Final   Culture  Setup Time     Final   Value: 05/06/2013 23:37     Performed at [facility]   [place] Count     Final   Value: NO GROWTH     Performed at [facility]   Culture     Final   Value: NO GROWTH     Performed at [facility]   Report Status 05/08/2013 FINAL   Final     Studies: Dg Chest Port 1 View  05/06/2013   CLINICAL DATA:  Fever  EXAM: PORTABLE CHEST - 1 VIEW  COMPARISON:  04/29/2013  FINDINGS: Heart size and vascular pattern are normal. There is a background of mild to moderate interstitial prominence, which represents a change when compared to the prior study.  IMPRESSION: Bilateral interstitial prominence. In the absence of vascular congestion, and in the presence of fever, the possibility of atypical bilateral pneumonia be considered.   Electronically Signed   By: Esperanza Heir M.D.   On: 05/06/2013 18:35    Scheduled Meds: . amLODipine  10 mg Per Tube Daily  . antiseptic oral rinse  15 mL Mouth Rinse q12n4p  . aspirin  81 mg Per Tube Daily  . chlorhexidine  15 mL Mouth Rinse BID  . clopidogrel  75 mg Per Tube Q breakfast  . feeding supplement (GLUCERNA 1.2 CAL)  1,000 mL Per Tube Q24H  . feeding supplement (PRO-STAT SUGAR FREE 64)  30 mL Per Tube Daily  . free water  250 mL Per Tube Q6H  . insulin aspart  0-15 Units Subcutaneous Q4H  . multivitamin  5 mL Per Tube Daily  . pantoprazole sodium  40 mg Per Tube Daily  . sodium chloride  3 mL Intravenous Q12H  .  sulfamethoxazole-trimethoprim  20 mL Per Tube Q12H   Continuous Infusions: . sodium chloride irrigation 50 mL (05/05/13 1554)    Principal Problem:   Hypernatremia Active Problems:   Diabetes mellitus without complication   Dysphagia   Acute encephalopathy   Acute renal failure   Generalized weakness   Adult failure to thrive   Protein-calorie malnutrition, severe    Time spent: > 30 mins    [facility]  [facility] Pager [phone]. If 7PM-7AM, please contact night-coverage at [email or web address], password [facility] 05/08/2013, 3:07 PM  LOS: 9 days

## 2013-05-08 NOTE — Progress Notes (Signed)
Pt lost IV access. Several unsuccessful attempts to restart IV. MD [place] made aware and stated to wait for attending to inform him to make decision. MD Janee Morn was paged about situation. Day nurse Tamika made aware. Pt safe and stable.

## 2013-05-08 NOTE — Evaluation (Signed)
Physical Therapy Evaluation Patient Details Name: Christine Durham MRN: [id number] DOB: [birth year] Today's Date: 05/08/2013 Time: 4098-1191 PT Time Calculation (min): 18 min  PT Assessment / Plan / Recommendation History of Present Illness  Pt had been at [facility] and then to home following multiple strokes causing hemiparesis bilaterally.  She is expressively aphasic and had to have a PEG tube procedure on 05-05-13 due to inability to swallow.  Per [facility] report, she had been progressing fairly well with rehab there...after return to home she declined, probably due to the inability to ingest food and had resulting weakness.  She now is admitted with severe hypernatremia and dehydration.  Clinical Impression   Pt was seen for evaluation.  She is manifesting a significant change in cognition as well as strength and mobility as compared to that seen 3 weeks ago.  Pt avoids eye contact although she would look at me.  She would not respond to any questions in any manner and the only command she would respond to was to grasp my hand with the left hand in one instance.  She has no visible muscle movement in the LUE or either LE.  It appears to me that she has very poor rehab potential, however if this major change seen is a temporary manifestation due to poor nutrition hopefully she will begin to respond.  For this reason only I will recommend a trial of SNF.    PT Assessment  All further PT needs can be met in the next venue of care    Follow Up Recommendations  SNF    Does the patient have the potential to tolerate intense rehabilitation      Barriers to Discharge        Equipment Recommendations  None recommended by PT    Recommendations for Other Services     Frequency      Precautions / Restrictions Precautions Precautions: Fall Restrictions Weight Bearing Restrictions: No   Pertinent Vitals/Pain       Mobility  Bed Mobility Bed Mobility: Not assessed (unable)    Exercises     PT  Diagnosis: Generalized weakness;Hemiplegia non-dominant side;Hemiplegia dominant side  PT Problem List: Decreased strength;Decreased activity tolerance;Decreased mobility;Impaired tone PT Treatment Interventions:       PT Goals(Current goals can be found in the care plan section) Acute Rehab PT Goals PT Goal Formulation: No goals set, d/c therapy  Visit Information  Last PT Received On: 05/08/13 History of Present Illness: Pt had been at [facility] and then to home following multiple strokes causing hemiparesis bilaterally.  She is expressively aphasic and had to have a PEG tube procedure on 05-05-13 due to inability to swallow.  Per [facility] report, she had been progressing fairly well with rehab there...after return to home she declined, probably due to the inability to ingest food and had resulting weakness.  She now is admitted with severe hypernatremia and dehydration.       Prior Functioning  Home Living Family/patient expects to be discharged to:: Skilled nursing facility Communication Communication: Expressive difficulties Dominant Hand: Right    Cognition  Cognition Arousal/Alertness: Awake/alert Behavior During Therapy: Flat affect Overall Cognitive Status: Difficult to assess Difficult to assess due to: Impaired communication    Extremity/Trunk Assessment Upper Extremity Assessment RUE Deficits / Details: No active motion in the RUE, full passive ROM...this is SIGNIFICANTLY different from evaluation on 04-14-13 where he had 4-/5 strength in major muscle groups LUE Deficits / Details: mild increased tone in the LUE, pt  will spontaneously move this arm but will only grasp my hand on occasion...this is quite different from evaluation on 04-14-13 where strength was 3+/5 in major muscle groups Lower Extremity Assessment RLE Deficits / Details: RLE appears to be flaccid with full passive ROM.Marland Kitchen..3 weeks ago strength was recorded as 3-/5 LLE Deficits / Details: no active motion of the LLE  with extensor tone noted .Marland KitchenMarland KitchenMarland Kitchen3 weeks ago she had 3/5 strength at the hip and 2/5 strength at the knee   Balance Balance Balance Assessed: No  End of Session PT - End of Session Activity Tolerance: Patient tolerated treatment well Patient left: in bed;with call bell/phone within reach;with bed alarm set  GP     [name] 05/08/2013, 10:03 AM

## 2013-05-08 NOTE — Progress Notes (Addendum)
Nutrition follow-up   INTERVENTION:  Glucerna 1.2 is infusing @ 25 ml/hr and 30 ml Prostat via tube TID.   Increase rate to 40 ml/hr and advance in 4 hr to goal of 50 ml/hr decrease ProStat to 30 ml/day .   Tube feeding regimen at goal rate providing: 1540 kcal, 87 grams protein, and 966 ml H2O.  Adequte to meet 100% of estimated energy, protein and fluid daily.  Continue Free water 250 q 6 hr provides additional 1000 ml water daily.  Add MVI daily via PEG  NUTRITION DIAGNOSIS: Inadequate oral intake related to stroke, progressive dysphagia; ongoing as evidenced by NPO for safety per ST recommendations.  Goal: Pt to meet >/= 90% of their estimated nutrition needs; progressing   Monitor:  Enteral advancement and tolerance, labs and weight changes  Reason for Assessment: TF management   72 y.o. female  Admitting Dx: Hypernatremia  ASSESSMENT: Pt tolerating tube feeding at current rate only 10 ml residuals noted. She is afebrile, O2 @ 2liters via nasal canula, BM on 10/26. Advance to goal as noted above.  Disposition: she will be returning to [facility] when cleared by  medical team. Hx: Pt is s/p PEG placement 20 fr. LUQ yesterday afternoon 10/24 by Dr. Karilyn Cota. Tolerated NS @ 50 ml/hr and now enteral feeding has been initiated. Pt receiving continuous Glucerna 1.2 @ 25 ml/hr via PEG (provides 720 kcal, 36 gr protein and 483 ml water, Prostat 30 ml TID (300 kcal, 45 gr protein) daily.  Patient Active Problem List   Diagnosis Date Noted  . Protein-calorie malnutrition, severe 05/01/2013  . Acute encephalopathy 04/30/2013  . Acute renal failure 04/30/2013  . Hypernatremia 04/30/2013  . Generalized weakness 04/30/2013  . Adult failure to thrive 04/30/2013  . possible Aspiration pneumonia 04/13/2013  . 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 Acute ischemic stroke 03/22/2013  . Dysphagia 03/22/2013  . UTI (urinary tract infection)  03/22/2013  . COPD (chronic obstructive pulmonary disease) 07/04/2012  . Physical deconditioning 07/04/2012  . CVA (cerebral infarction) 07/04/2012  . Asthma   . Tobacco abuse 07/01/2012  . Acute respiratory failure 06/28/2012  . NICM (nonischemic cardiomyopathy) 06/28/2012  . Hypotension 06/28/2012  . Non-ST elevation myocardial infarction (NSTEMI), initial care episode - Type 2 secondary to respiratory issues 06/28/2012  . Diabetes mellitus without complication   . Acute on chronic systolic CHF (congestive heart failure) 06/27/2012   Height: Ht Readings from Last 1 Encounters:  04/30/13 5\' 7"  (1.702 m)    Weight status: Wt Readings from Last 1 Encounters:  05/08/13 121 lb 7.6 oz (55.1 kg)  admit wt 10/18- 114.10# (52kg)  Ideal Body Weight: 135# (61.3 kg)  % Ideal Body Weight: 86%  Wt Readings from Last 10 Encounters:  05/08/13 121 lb 7.6 oz (55.1 kg)  05/08/13 121 lb 7.6 oz (55.1 kg)  04/12/13 123 lb 10.9 oz (56.1 kg)  04/10/13 133 lb (60.328 kg)  04/10/13 133 lb (60.328 kg)  03/29/13 128 lb (58.06 kg)  03/25/13 128 lb 14.4 oz (58.469 kg)  07/04/12 122 lb 12.7 oz (55.7 kg)  07/04/12 129 lb 8 oz (58.741 kg)  07/04/12 129 lb 8 oz (58.741 kg)    Usual Body Weight: 130-133#  BMI:  Body mass index is 19.02 kg/(m^2).borderline underweight  Estimated Nutritional Needs: Kcal: 1590-1855 Protein: 70-80 gr Fluid: >1600 ml /day   Skin: intact  Diet Order: NPO  EDUCATION NEEDS: -Education not appropriate at this time  Intake/Output Summary (Last 24 hours) at 05/08/13 0925 Last data filed at 05/07/13 1712  Gross per 24 hour  Intake 1370.83 ml  Output      0 ml  Net 1370.83 ml    Last BM: 05/07/13  Labs:   Recent Labs Lab 05/06/13 0703 05/07/13 0726 05/08/13 0519  NA 138 145 146*  K 3.0* 4.2 3.5  CL 104 113* 111  CO2 24 23 27   BUN 7 10 12   CREATININE 0.75 0.85 0.59  CALCIUM 9.5 9.2 9.5  GLUCOSE 203* 171* 167*    CBG (last 3)   Recent Labs   05/08/13 0037 05/08/13 0338 05/08/13 0754  GLUCAP 124* 131* 183*    Scheduled Meds: . amLODipine  10 mg Per Tube Daily  . antiseptic oral rinse  15 mL Mouth Rinse q12n4p  . aspirin  81 mg Per Tube Daily  . chlorhexidine  15 mL Mouth Rinse BID  . clopidogrel  75 mg Per Tube Q breakfast  . feeding supplement (GLUCERNA 1.2 CAL)  1,000 mL Per Tube Q24H  . feeding supplement (PRO-STAT SUGAR FREE 64)  30 mL Per Tube TID WC  . free water  250 mL Per Tube Q6H  . insulin aspart  0-15 Units Subcutaneous Q4H  . pantoprazole sodium  40 mg Per Tube Daily  . sodium chloride  3 mL Intravenous Q12H  . sulfamethoxazole-trimethoprim  20 mL Per Tube Q12H    Continuous Infusions: . sodium chloride irrigation 50 mL (05/05/13 1554)    Past Medical History  Diagnosis Date  . 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 Diabetes mellitus without complication   . Asthma   . HTN (hypertension)   . Tobacco abuse   . 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 COPD (chronic obstructive pulmonary disease)     Past Surgical History  Procedure Laterality Date  . Appendectomy      Royann Shivers MS,RD,LDN,CSG Office: [phone] Pager: [phone]

## 2013-05-09 ENCOUNTER — Inpatient Hospital Stay
Admission: RE | Admit: 2013-05-09 | Discharge: 2013-06-05 | Disposition: A | Discharge status: Nursing Home (Includes ICF) | Payer: Medicare Other | Source: Ambulatory Visit | Attending: Internal Medicine | Admitting: Internal Medicine

## 2013-05-09 DIAGNOSIS — R0989 Other specified symptoms and signs involving the circulatory and respiratory systems: Principal | ICD-10-CM

## 2013-05-09 DIAGNOSIS — Z931 Gastrostomy status: Secondary | ICD-10-CM

## 2013-05-09 LAB — BASIC METABOLIC PANEL
BUN: 11 mg/dL (ref 6–23)
Creatinine, Ser: 0.61 mg/dL (ref 0.50–1.10)
GFR calc Af Amer: >90 mL/min (ref >90)
GFR calc non Af Amer: 90 mL/min — ABNORMAL LOW (ref >90)

## 2013-05-09 LAB — CULTURE, BLOOD (ROUTINE X 2)
Culture: NO GROWTH
Culture: NO GROWTH

## 2013-05-09 LAB — GLUCOSE, CAPILLARY
Glucose-Capillary: 156 mg/dL — ABNORMAL HIGH (ref 70–99)
Glucose-Capillary: 215 mg/dL — ABNORMAL HIGH (ref 70–99)

## 2013-05-09 MED ORDER — GLUCERNA 1.2 CAL PO LIQD: 1000.0000 mL | ORAL | Status: DC

## 2013-05-09 MED ORDER — PRO-STAT SUGAR FREE PO LIQD: 30.0000 mL | Freq: Every day | ORAL | Status: AC

## 2013-05-09 MED ORDER — SULFAMETHOXAZOLE-TRIMETHOPRIM 200-40 MG/5ML PO SUSP: 20.0000 mL | Freq: Two times a day (BID) | ORAL | Status: AC

## 2013-05-09 MED ORDER — PANTOPRAZOLE SODIUM 40 MG PO PACK: 40.0000 mg | PACK | Freq: Every day | ORAL | Status: AC

## 2013-05-09 MED ORDER — ADULT MULTIVITAMIN LIQUID CH: 5.0000 mL | Freq: Every day | ORAL | Status: AC

## 2013-05-09 MED ORDER — FREE WATER: 250.0000 mL | Freq: Four times a day (QID) | Status: AC

## 2013-05-09 MED ORDER — POTASSIUM CHLORIDE 20 MEQ/15ML (10%) PO LIQD
20.0000 meq | Freq: Once | ORAL | Status: AC
  Administered 2013-05-09: 20 meq
  Filled 2013-05-09: qty 30

## 2013-05-09 NOTE — Progress Notes (Addendum)
Pt is to be discharged to [facility] today. Pt is in NAD, IV is out, all paperwork has been reviewed/discussed with patient's family, and there are no questions/concerns at this time. Assessment is unchanged from this morning. Report has been called to receiving nurse at [facility]. Pt is to be accompanied to the [facility] via bed by staff.

## 2013-05-09 NOTE — Discharge Summary (Signed)
Physician Discharge Summary  Christine Durham ZOX:[id number] DOB: July 10, 1941 DOA: 04/29/2013  PCP: Ignatius Specking., MD  Admit date: 04/29/2013 Discharge date: 05/09/2013  Time spent: 65 minutes  Recommendations for Outpatient Follow-up:  1. Followup with M.D. at the skilled nursing facility. Patient will need basic metabolic profile done one week post discharge to followup on electrolytes and renal function.  Discharge Diagnoses:  Principal Problem:   Hypernatremia Active Problems:   Diabetes mellitus without complication   Dysphagia   Acute encephalopathy   Acute renal failure   Generalized weakness   Adult failure to thrive   Protein-calorie malnutrition, severe   Discharge Condition: Stable and improved  Diet recommendation: PEG tube feedings  Filed Weights   05/07/13 0411 05/08/13 0417 05/09/13 0308  Weight: 55 kg (121 lb 4.1 oz) 55.1 kg (121 lb 7.6 oz) 54.2 kg (119 lb 7.8 oz)    History of present illness:  This is a 72 y.o. 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 Level V caveat as patient is predominantly nonverbal secondary to cerebrovascular disease. Patient's niece is at the bedside. 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 Followed by Dr. Durwin Nora with vascular surgery. The patient was discharged with medical management as well as a dysphagia 3 diet. Per the niece, patient has had progressive difficulty in swallowing foods. At home suctioning was ordered to help with retained food in the airway. However, this has not yet been  received. Knees feels that this will help tremendously with patient feeding as patient has used this in a nursing home previously. However, patient has been unable to eat or drink over the past 4-5 days because of severe old debris. Niece denies any fevers or chills. No episodes of vomiting. No recent falls. No worsening right-sided hemiparesis. No worsening left-sided residual deficit. Dysarthria has persisted since hospital discharge.  Patient presents today in the ER with noticed sodium of 161, creatinine 1.35 (baseline 0.6), BUN of 44, calcium 10.7. A chest x-ray was also obtained that showed bibasilar atelectasis without any focal infiltrate. Patient also had a white count of 12.6. Afebrile on presentation. Systolic pressure in the 140s. Satting 99% on room air without any respiratory distress or cough.   Hospital Course:  #1 severe hypernatremia  Patient was admitted with severe hypernatremia with a sodium of 161. Likely secondary to volume depletion and dehydration. Patient with poor oral intake secondary to dysphagia and recent strokes with failure to thrive. Patient was placed on D5W and hydrated with IV fluids. Patient was placed on supportive care. Patient's sodium level trended down. Patient improved clinically. Patient's sodium improved and had resolved by day of discharge. Patient was been started on PEG tube feeds and is maintained on free water flushes of 250 cc every 6 hours. Patient's sodium has remained stable and she'll be discharged in stable and improved condition.  #2 acute renal failure  Likely secondary to prerenal azotemia. Improved with hydration. Resolved by day of discharge. #3 bulbar erosions  During the hospitalization the family had requested a PEG tube placement. GI consultation was obtained patient underwent PEG tube placement. It was noted that patient had erosive bulbar  duodenitis. It was recommended that patient be maintained on a proton pump inhibitor. Patient will be  discharged on Protonix 40 mg daily per G-tube.  #4 dysphagia status post PEG placement  Likely multifactorial secondary to multiple strokes and difficulty clearing secretions. Patient has been seen by speech therapy with no significant improvement. Patient maintained on bowel rest secondary to her dysphagia and increased risk of aspiration. Per family patient has been unable to tolerate any oral intake for the past 2-3 weeks. No new focal deficits noted. The family requested PEG tube placement GI was consulted and a PEG tube was placed. Patient was subsequently started on tube feeds which are currently at goal. #5 acute encephalopathy  Likely secondary to electrolyte disturbance of hyponatremia in the setting of multiple strokes and dementia. Patient is currently afebrile. Chest x-ray on admission was negative. No signs or symptoms of infection. Patient's sodium was corrected with improvement in her encephalopathy and patient was close to baseline by day of discharge. During the hospitalization patient was noted to spike a fever urinalysis which was done was worrisome for a UTI and a such patient was started empirically on IV antibiotics. This was subsequently transitioned to oral antibiotics per G-tube. Patient be discharged on 2 more days of oral antibiotics completed a course of therapy. By day of discharge patient was close to baseline. #6 history of ischemic stroke 9/ 2014 with an expressive aphasia.  Patient did have a history of extension of stroke October 2014 with dysphagia, dysarthria, right hemiplegia and aspiration pneumonia. Left hemispheric watershed distribution. No evidence of progression or neurological dysfunction. Patient was maintained on aspirin. Patient's Plavix was held was PEG tube was placed. After PEG tube was placed patient's Plavix was resumed. Patient is status post PEG tube placement for nutrition as she has significant dysphagia with inability to clear secretions appropriately.  Tube feeds were started which patient tolerated. Patient be maintained on aspirin and Plavix for secondary stroke prevention.  #7 failure to thrive  Likely secondary to #1 and 6.  #8 COPD  Stable. Continued on nebs.  #9 chest congestion by history  Patient is afebrile. Chest x-ray on admission was negative.  #10 generalized weakness  PT followed patient during the hospitalization and recommended skilled nursing facility.  #11 dementia  Stable.  #12 hypokalemia  Repleted.    Procedures: Chest x-ray 04/29/2013  PEG tube placement 05/05/2013 per Dr. Karilyn Cota     Consultations: Speech therapy  GI consult: Dr. Karilyn Cota 05/04/2013   Discharge Exam: Filed Vitals:   05/09/13 0640  BP: 134/75  Pulse: 91  Temp: 98.4 F (36.9 C)  Resp: 18    General: NAD. Non verbal Cardiovascular: RRR Respiratory: CTAB anterior lung fields.  Discharge Instructions      Discharge Orders   Future Appointments Provider Department Dept Phone   10/04/2013 10:30 AM [facility] Jason Nest [phone]   10/04/2013 11:30 AM Chuck Hint, MD [facility] [phone]   Future Orders Complete By Expires   Discharge instructions  As directed    Comments:     Follow up with MD at SNF.   Increase activity slowly  As directed        Medication List         albuterol 108 (90 BASE) MCG/ACT inhaler  Commonly known as:  PROVENTIL HFA;VENTOLIN HFA  Inhale 2 puffs into the lungs every 6 (six) hours as needed for wheezing.     amLODipine 5  MG tablet  Commonly known as:  NORVASC  Take 2 tablets (10 mg total) by mouth daily.     aspirin EC 81 MG tablet  Take 1 tablet (81 mg total) by mouth daily.     atorvastatin 10 MG tablet  Commonly known as:  LIPITOR  Take 2 tablets (20 mg total) by mouth at bedtime.     clopidogrel 75 MG tablet  Commonly known as:  PLAVIX  Take 1 tablet (75 mg total) by mouth daily with breakfast.      feeding supplement (GLUCERNA 1.2 CAL) Liqd  Place 1,000 mLs into feeding tube daily. At 50 ml/h     feeding supplement (PRO-STAT SUGAR FREE 64) Liqd  Place 30 mLs into feeding tube daily.     free water Soln  Place 250 mLs into feeding tube every 6 (six) hours.     metFORMIN 500 MG tablet  Commonly known as:  GLUCOPHAGE  Take 1 tablet (500 mg total) by mouth 2 (two) times daily with a meal.     multivitamin Liqd  Place 5 mLs into feeding tube daily.     pantoprazole sodium 40 mg/20 mL Pack  Commonly known as:  PROTONIX  Place 20 mLs (40 mg total) into feeding tube daily.     sulfamethoxazole-trimethoprim 200-40 MG/5ML suspension  Commonly known as:  BACTRIM,SEPTRA  Place 20 mLs into feeding tube every 12 (twelve) hours. Take for 2 days then stop.       No Known Allergies Follow-up Information   Please follow up. (f/u with MD at SNF)        The results of significant diagnostics from this hospitalization (including imaging, microbiology, ancillary and laboratory) are listed below for reference.    Significant Diagnostic Studies: Dg Chest 1 View  04/11/2013   CLINICAL DATA:  Cough.  EXAM: CHEST - 1 VIEW  COMPARISON:  03/22/2013  FINDINGS: The heart size is normal. Main pulmonary arteries are prominent suggesting pulmonary arterial hypertension. The lungs are somewhat hyperinflated suggesting emphysema. Tiny area scarring at the left base laterally. There appears to be a slight compression deformity of T12, age indeterminate.  IMPRESSION: Probable COPD. Possible compression fracture of T12, age indeterminate.   Electronically Signed   By: Geanie Cooley   On: 04/11/2013 14:42   Dg Chest 2 View  04/29/2013   CLINICAL DATA:  Congestion. Short of breath.  EXAM: CHEST  2 VIEW  COMPARISON:  04/15/2013  FINDINGS: Low lung volumes. Upper normal heart size. Upper lungs clear. No pneumothorax. Stable T12 compression deformity.  IMPRESSION: Bibasilar atelectasis.   Electronically Signed    By: Maryclare Bean M.D.   On: 04/29/2013 17:28   Ct Chest Wo Contrast  04/13/2013   CLINICAL DATA:  Shortness of breath.  EXAM: CT CHEST WITHOUT CONTRAST  TECHNIQUE: Multidetector CT imaging of the chest was performed following the standard protocol without IV contrast.  COMPARISON:  Chest radiograph 04/12/2013  FINDINGS: There is no pleural effusion identified. No pleural effusion identified. There is mild changes of centrilobular emphysema noted. No airspace consolidation or atelectasis identified. Subpleural nodule in the right middle lobe measures 3 mm, image 38/ series 3. There is a nodule at the right base which measures 5 mm, image 43/series 3.  The trachea appears patent and is midline. The heart size is normal. No pericardial effusion. There is a lower right paratracheal lymph node measuring 8 mm, image 23/series 2. Left paratracheal lymph node measures 8 mm, image 22/ series 2.  The esophagus appears within normal limits. There is no axillary or supraclavicular lymph nodes.  Limited imaging through the upper abdomen shows several calcified granulomas within the liver. Review of the visualized bony structures is significant for mild spondylosis. No aggressive lytic or sclerotic bone lesions. T12 compression fracture is identified with loss of 50% of the vertebral body height. This is age indeterminate.  IMPRESSION: 1. No acute cardiopulmonary abnormalities.  2. Subpleural nodule in the right middle lobe and parenchymal nodule in the right base are both nonspecific. If the patient is at high risk for bronchogenic carcinoma, follow-up chest CT at 6-12 months is recommended. If the patient is at low risk for bronchogenic carcinoma, follow-up chest CT at 12 months is recommended. This recommendation follows the consensus statement: Guidelines for Management of Small Pulmonary Nodules Detected on CT Scans: A Statement from the Fleischner Society as published in Radiology 2005;237:395-400.  3. Age indeterminate T12  compression fracture with loss of 40% of the vertebral body height.   Electronically Signed   By: Signa Kell M.D.   On: 04/13/2013 14:03   Mr Brain Wo Contrast  04/12/2013   CLINICAL DATA:  Worsening right-sided weakness, drooling, and dysphagia. Recent admission for left hemisphere stroke.  EXAM: MRI HEAD WITHOUT CONTRAST  TECHNIQUE: Multiplanar, multisequence MR imaging was performed. No intravenous contrast was administered.  COMPARISON:  MRI 03/22/2013. MRA 03/23/2013. Report of cerebral angiogram from 04/10/2013 indicates left internal carotid artery thrombosis.  FINDINGS: There has been progression of the previously identified left hemisphere infarction. Multiple frontal, posterior frontal, basal ganglia, insular, parietal, and occipital lobe subcentimeter lesions with restricted diffusion affect the left hemisphere cortex and subcortical white matter in a linear fashion consistent with a watershed type event due to internal carotid artery occlusion.  There is no evidence for flow related enhancement in the left internal carotid artery skull base or cavernous segments. This represents a change from previous MRI and MRA and is consistent with thrombosis found on angiography.  A small focus of acute infarction affects the right occipital cortex, of unknown significance.  Motion degraded exam with atrophy and chronic microvascular ischemic change. Remote left basal ganglia hemorrhagic infarct re-demonstrated. No evidence for acute hemorrhage on today's examination.  Scalp, calvarium, orbits, sinuses, mastoids, and other visualized extracranial soft tissues grossly unremarkable.  IMPRESSION: Progression of the previously identified left hemisphere multifocal infarcts in a watershed distribution. The left internal carotid artery which was previously stenotic but patent on prior MRI/ MRA from 03/22/2013 is now thrombosed. Subcentimeter right occipital infarct incidentally noted.  Baseline atrophy and chronic  microvascular ischemic change without intracranial hemorrhage or mass-effect.   Electronically Signed   By: Davonna Belling M.D.   On: 04/12/2013 18:43   Dg Chest Port 1 View  05/06/2013   CLINICAL DATA:  Fever  EXAM: PORTABLE CHEST - 1 VIEW  COMPARISON:  04/29/2013  FINDINGS: Heart size and vascular pattern are normal. There is a background of mild to moderate interstitial prominence, which represents a change when compared to the prior study.  IMPRESSION: Bilateral interstitial prominence. In the absence of vascular congestion, and in the presence of fever, the possibility of atypical bilateral pneumonia be considered.   Electronically Signed   By: Esperanza Heir M.D.   On: 05/06/2013 18:35   Dg Chest Port 1 View  04/15/2013   CLINICAL DATA:  Evaluate pneumonia  EXAM: PORTABLE CHEST - 1 VIEW  COMPARISON:  CT 04/13/2013  FINDINGS: Normal mediastinum and cardiac silhouette. Normal pulmonary  vasculature. No evidence of effusion, infiltrate, or pneumothorax. Mild left basilar atelectasis. No acute bony abnormality.  IMPRESSION: No evidence of pneumonia.   Electronically Signed   By: Genevive Bi M.D.   On: 04/15/2013 10:35   Dg Chest Portable 1 View  04/12/2013   CLINICAL DATA:  Extreme weakness, dysphagia, stroke, history asthma, diabetes, COPD, smoking, MI  EXAM: PORTABLE CHEST - 1 VIEW  COMPARISON:  Portable exam 1721 hr compared to 04/11/2013  FINDINGS: Normal heart size, mediastinal contours, and pulmonary vascularity.  Atherosclerotic calcification aorta.  Emphysematous and bronchitic changes consistent with COPD.  No definite infiltrate, pleural effusion, or pneumothorax.  Diffuse osseous demineralization.  IMPRESSION: COPD changes.  No acute abnormalities.   Electronically Signed   By: Ulyses Southward M.D.   On: 04/12/2013 17:37    Microbiology: Recent Results (from the past 240 hour(s))  MRSA PCR SCREENING     Status: None   Collection Time    04/29/13 11:01 PM      Result Value Range Status    MRSA by PCR NEGATIVE  NEGATIVE Final   Comment:            The GeneXpert MRSA Assay (FDA     approved for NASAL specimens     only), is one component of a     comprehensive MRSA colonization     surveillance program. It is not     intended to diagnose MRSA     infection nor to guide or     monitor treatment for     MRSA infections.  URINE CULTURE     Status: None   Collection Time    05/01/13  4:40 PM      Result Value Range Status   Specimen Description URINE, CLEAN CATCH   Final   Special Requests NONE   Final   Culture  Setup Time     Final   Value: 05/02/2013 00:29     Performed at [facility] Count     Final   Value: NO GROWTH     Performed at [facility]   Culture     Final   Value: NO GROWTH     Performed at [facility]   Report Status 05/02/2013 FINAL   Final  CULTURE, BLOOD (ROUTINE X 2)     Status: None   Collection Time    05/04/13 12:02 AM      Result Value Range Status   Specimen Description BLOOD RIGHT HAND   Final   Special Requests     Final   Value: BOTTLES DRAWN AEROBIC AND ANAEROBIC AEB=6CC ANA=5CC   Culture NO GROWTH 5 DAYS   Final   Report Status 05/09/2013 FINAL   Final  CULTURE, BLOOD (ROUTINE X 2)     Status: None   Collection Time    05/04/13 12:02 AM      Result Value Range Status   Specimen Description BLOOD LEFT HAND   Final   Special Requests     Final   Value: BOTTLES DRAWN AEROBIC AND ANAEROBIC AEB=6CC ANA=5CC   Culture NO GROWTH 5 DAYS   Final   Report Status 05/09/2013 FINAL   Final  URINE CULTURE     Status: None   Collection Time    05/06/13  2:51 PM      Result Value Range Status   Specimen Description URINE, CATHETERIZED   Final   Special Requests NONE   Final  Culture  Setup Time     Final   Value: 05/06/2013 23:37     Performed at [facility] Count     Final   Value: NO GROWTH     Performed at [facility]   Culture     Final   Value: NO GROWTH      Performed at [facility]   Report Status 05/08/2013 FINAL   Final     Labs: Basic Metabolic Panel:  Recent Labs Lab 05/05/13 0614 05/06/13 0703 05/07/13 0726 05/08/13 0519 05/09/13 0558  NA 145 138 145 146* 142  K 3.6 3.0* 4.2 3.5 3.5  CL 111 104 113* 111 105  CO2 24 24 23 27 28   GLUCOSE 219* 203* 171* 167* 185*  BUN 6 7 10 12 11   CREATININE 0.70 0.75 0.85 0.59 0.61  CALCIUM 9.7 9.5 9.2 9.5 9.6   Liver Function Tests: No results found for this basename: AST, ALT, ALKPHOS, BILITOT, PROT, ALBUMIN,  in the last 168 hours No results found for this basename: LIPASE, AMYLASE,  in the last 168 hours No results found for this basename: AMMONIA,  in the last 168 hours CBC:  Recent Labs Lab 05/04/13 0617 05/05/13 0614 05/06/13 0703 05/07/13 0726 05/08/13 0519  WBC 10.4 10.1 9.4 9.4 8.0  HGB 10.7* 11.0* 10.0* 9.3* 9.7*  HCT 33.0* 33.5* 30.4* 28.7* 29.6*  MCV 89.7 89.3 88.6 89.7 90.8  PLT 188 191 207 218 233   Cardiac Enzymes: No results found for this basename: CKTOTAL, CKMB, CKMBINDEX, TROPONINI,  in the last 168 hours BNP: BNP (last 3 results)  Recent Labs  06/26/12 2320  PROBNP 150.2*   CBG:  Recent Labs Lab 05/08/13 1726 05/08/13 2102 05/09/13 0006 05/09/13 0346 05/09/13 0806  GLUCAP 161* 126* 197* 205* 156*       Signed:  THOMPSON,DANIEL  [facility] 05/09/2013, 11:21 AM

## 2013-05-09 NOTE — Progress Notes (Signed)
Pt has 0ml gastric residual. Increased feeding rate to 62ml/hr. Pt now at goal rate. Will continue to monitor.

## 2013-05-09 NOTE — Clinical Social Work Note (Signed)
Pt d/c today to [facility]. Pt's sister and facility aware and agreeable. Sister to complete paperwork today at [facility]. Pt will transfer with RN. D/C summary faxed.  Derenda Fennel, [place] [phone]

## 2013-05-09 NOTE — Progress Notes (Signed)
Nutrition follow-up   INTERVENTION:  Glucerna 1.2 @ goal rate of 50 ml/hr and ProStat to 30 ml/day via PEG .   Tube feeding regimen at goal rate providing: 1540 kcal, 87 grams protein, and 966 ml H2O.  Adequte to meet 100% of estimated energy, protein and fluid daily.  Free water 250 q 6 hr provides additional 1000 ml water daily.  Add MVI daily via PEG  NUTRITION DIAGNOSIS: Inadequate oral intake related to stroke, progressive dysphagia; ongoing as evidenced by NPO for safety per ST recommendations.  Goal: Pt to meet >/= 90% of their estimated nutrition needs; progressing   Monitor:  Enteral advancement and tolerance, labs and weight changes  Reason for Assessment: TF management   72 y.o. female  Admitting Dx: Hypernatremia  ASSESSMENT: Pt TF advanced to goal rate earlier this morning and she is tolerating well with 0 ml residuals. BMP WNL, Glucose elevated.  10/27-Pt tolerating tube feeding at current rate only 10 ml residuals noted. She is afebrile, O2 @ 2liters via nasal canula, BM on 10/26. Advance to goal as noted above.  Disposition: she will be returning to [facility] when cleared by  medical team.  Hx: Pt is s/p PEG placement 20 fr. LUQ yesterday afternoon 10/24 by Dr. Karilyn Cota. Tolerated NS @ 50 ml/hr and now enteral feeding has been initiated. Pt receiving continuous Glucerna 1.2 @ 25 ml/hr via PEG (provides 720 kcal, 36 gr protein and 483 ml water, Prostat 30 ml TID (300 kcal, 45 gr protein) daily.  Patient Active Problem List   Diagnosis Date Noted  . Protein-calorie malnutrition, severe 05/01/2013  . Acute encephalopathy 04/30/2013  . Acute renal failure 04/30/2013  . Hypernatremia 04/30/2013  . Generalized weakness 04/30/2013  . Adult failure to thrive 04/30/2013  . possible Aspiration pneumonia 04/13/2013  . 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 Acute ischemic stroke 03/22/2013  . Dysphagia 03/22/2013  . UTI  (urinary tract infection) 03/22/2013  . COPD (chronic obstructive pulmonary disease) 07/04/2012  . Physical deconditioning 07/04/2012  . CVA (cerebral infarction) 07/04/2012  . Asthma   . Tobacco abuse 07/01/2012  . Acute respiratory failure 06/28/2012  . NICM (nonischemic cardiomyopathy) 06/28/2012  . Hypotension 06/28/2012  . Non-ST elevation myocardial infarction (NSTEMI), initial care episode - Type 2 secondary to respiratory issues 06/28/2012  . Diabetes mellitus without complication   . Acute on chronic systolic CHF (congestive heart failure) 06/27/2012   Height: Ht Readings from Last 1 Encounters:  04/30/13 5\' 7"  (1.702 m)    Weight status: Wt Readings from Last 1 Encounters:  05/09/13 119 lb 7.8 oz (54.2 kg)  admit wt 10/18- 114.10# (52kg)  Ideal Body Weight: 135# (61.3 kg)  % Ideal Body Weight: 86%  Wt Readings from Last 10 Encounters:  05/09/13 119 lb 7.8 oz (54.2 kg)  05/09/13 119 lb 7.8 oz (54.2 kg)  04/12/13 123 lb 10.9 oz (56.1 kg)  04/10/13 133 lb (60.328 kg)  04/10/13 133 lb (60.328 kg)  03/29/13 128 lb (58.06 kg)  03/25/13 128 lb 14.4 oz (58.469 kg)  07/04/12 122 lb 12.7 oz (55.7 kg)  07/04/12 129 lb 8 oz (58.741 kg)  07/04/12 129 lb 8 oz (58.741 kg)    Usual Body Weight: 130-133#  BMI:  Body mass index is 18.71 kg/(m^2).normal range  Estimated Nutritional Needs: Kcal: 1590-1855 Protein: 70-80 gr Fluid: >1600 ml /day   Skin: intact  Diet Order: NPO  EDUCATION NEEDS: -Education not appropriate at this  time  No intake or output data in the 24 hours ending 05/09/13 1051  Last BM: 05/07/13  Labs:   Recent Labs Lab 05/07/13 0726 05/08/13 0519 05/09/13 0558  NA 145 146* 142  K 4.2 3.5 3.5  CL 113* 111 105  CO2 23 27 28   BUN 10 12 11   CREATININE 0.85 0.59 0.61  CALCIUM 9.2 9.5 9.6  GLUCOSE 171* 167* 185*    CBG (last 3)   Recent Labs  05/09/13 0006 05/09/13 0346 05/09/13 0806  GLUCAP 197* 205* 156*    Scheduled Meds: .  amLODipine  10 mg Per Tube Daily  . antiseptic oral rinse  15 mL Mouth Rinse q12n4p  . aspirin  81 mg Per Tube Daily  . chlorhexidine  15 mL Mouth Rinse BID  . clopidogrel  75 mg Per Tube Q breakfast  . feeding supplement (GLUCERNA 1.2 CAL)  1,000 mL Per Tube Q24H  . feeding supplement (PRO-STAT SUGAR FREE 64)  30 mL Per Tube Daily  . free water  250 mL Per Tube Q6H  . insulin aspart  0-15 Units Subcutaneous Q4H  . multivitamin  5 mL Per Tube Daily  . pantoprazole sodium  40 mg Per Tube Daily  . sodium chloride  3 mL Intravenous Q12H  . sulfamethoxazole-trimethoprim  20 mL Per Tube Q12H    Continuous Infusions: . sodium chloride irrigation 50 mL (05/05/13 1554)    Past Medical History  Diagnosis Date  . 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 Diabetes mellitus without complication   . Asthma   . HTN (hypertension)   . Tobacco abuse   . 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 COPD (chronic obstructive pulmonary disease)     Past Surgical History  Procedure Laterality Date  . Appendectomy      Royann Shivers MS,RD,LDN,CSG Office: [phone] Pager: [phone]

## 2013-05-09 NOTE — Clinical Social Work Placement (Signed)
Clinical Social Work Department CLINICAL SOCIAL WORK PLACEMENT NOTE 05/09/2013  Patient:  LARAINE, SAMET  Account Number:  [id number] Admit date:  04/29/2013  Clinical Social Worker:  Derenda Fennel, LCSW  Date/time:  05/05/2013 01:55 PM  Clinical Social Work is seeking post-discharge placement for this patient at the following level of care:   SKILLED NURSING   (*CSW will update this form in Epic as items are completed)   05/05/2013  Patient/family provided with [facility] Department of Clinical Social Work's list of facilities offering this level of care within the geographic area requested by the patient (or if unable, by the patient's family).  05/05/2013  Patient/family informed of their freedom to choose among providers that offer the needed level of care, that participate in Medicare, Medicaid or managed care program needed by the patient, have an available bed and are willing to accept the patient.  05/05/2013  Patient/family informed of [facility]' ownership interest in [facility], as well as of the fact that they are under no obligation to receive care at this facility.  PASARR submitted to EDS on  PASARR number received from EDS on   FL2 transmitted to all facilities in geographic area requested by pt/family on  05/05/2013 FL2 transmitted to all facilities within larger geographic area on   Patient informed that his/her managed care company has contracts with or will negotiate with  certain facilities, including the following:     Patient/family informed of bed offers received:  05/05/2013 Patient chooses bed at [facility] Physician recommends and patient chooses bed at  [facility]  Patient to be transferred to [facility] on  05/09/2013 Patient to be transferred to facility by RN  The following physician request were entered in Epic:   Additional Comments: Pt has existing pasarr number.  Derenda Fennel, [place] [phone]

## 2013-05-10 ENCOUNTER — Encounter (HOSPITAL_COMMUNITY): Payer: Self-pay | Admitting: Internal Medicine

## 2013-05-10 ENCOUNTER — Non-Acute Institutional Stay (SKILLED_NURSING_FACILITY): Payer: Medicare Other | Admitting: Internal Medicine

## 2013-05-10 DIAGNOSIS — IMO0001 Reserved for inherently not codable concepts without codable children: Secondary | ICD-10-CM

## 2013-05-10 DIAGNOSIS — R1314 Dysphagia, pharyngoesophageal phase: Secondary | ICD-10-CM

## 2013-05-10 DIAGNOSIS — E870 Hyperosmolality and hypernatremia: Secondary | ICD-10-CM

## 2013-05-10 DIAGNOSIS — I69959 Hemiplegia and hemiparesis following unspecified cerebrovascular disease affecting unspecified side: Secondary | ICD-10-CM

## 2013-05-10 LAB — GLUCOSE, CAPILLARY
Glucose-Capillary: 248 mg/dL — ABNORMAL HIGH (ref 70–99)
Glucose-Capillary: 185 mg/dL — ABNORMAL HIGH (ref 70–99)
Glucose-Capillary: 232 mg/dL — ABNORMAL HIGH (ref 70–99)
Glucose-Capillary: 211 mg/dL — ABNORMAL HIGH (ref 70–99)

## 2013-05-10 NOTE — Progress Notes (Signed)
Patient ID: Christine Durham, female   DOB: [birth year], 72 y.o.   MRN: [id number]  Facility; [facility] Chief complaint; admission to SNF post admit to [facility] from October 18 to October 28  History; this is a unfortunate 72 year old woman we actually had in the facility in September. At that point she had had a new left hemisphere stroke on the background of a previous right hemisphere stroke. In spite of this she really only had problems with the significant dysarthria and dysphagia. 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 Her most recent MRI as shown below.DOB: October 02, 1940                                            DATE OF PROCEDURE: 04/10/2013  INDICATIONS: Christine Durham is a 72 y.o. female who had a recent left brain stroke. Duplex scan and MRI suggested possible distal occlusion of the left internal carotid artery she is brought in for diagnostic cerebral arteriogram.  PROCEDURE:  1. Ultrasound-guided access to the right common femoral artery 2. Arch aortogram 3. Selective catheterization of the right common carotid artery with right carotid arteriogram 4. Selective catheterization of the left common carotid artery with left carotid arteriogram  SURGEON: Di Kindle. Edilia Bo, MD, FACS  ANESTHESIA: local   EBL: minimal  TECHNIQUE: The patient was brought to the peripheral vascular lab. Both groins were prepped and draped in usual sterile fashion. Under ultrasound guidance, after the skin was anesthetized, the right common femoral artery was cannulated and a guidewire introduced into the infrarenal aorta under fluoroscopic control. A 5 French sheath was introduced over the wire. A long pigtail catheter was positioned in the ascending aortic arch and an arch aortogram was obtained a 40 LAO projection. This catheter was  exchanged for an H1 catheter which was positioned into the innominate artery. It was able to advance the wire into the common carotid artery and then the catheter was advanced over the wire. Selective right common carotid arteriogram was obtained with both intracranial and extracranial views. (The intracranial views will be interpreted separately by the neuroradiologist). I then exchanged the H1 catheter for someone catheter. This was placed into the common carotid artery. Of note the patient had a bovine arch. Elective left common carotid arteriogram is obtained with both intracranial and extracranial views. At the completion of the procedure the catheter was removed over a wire. The patient was transferred to the holding area for removal of the sheath. No immediate complications were noted.  FINDINGS:   1. There is no significant atherosclerotic disease of the aortic arch. The innominate artery, right subclavian artery, right vertebral artery, left subclavian artery, left vertebral arteries are all patent.   2. On the left side, which is the symptomatic side, the common carotid artery and external carotid arteries are patent. 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 3. On the right side, the common carotid artery, external carotid artery, and internal carotid artery are patent. There is no stenosis of the right carotid bifurcation or internal carotid artery extracranially on the right.  Christine Ferrari, MD, FACS [facility] [place]  CLINICAL DATA:  Worsening right-sided weakness, drooling, and dysphagia. Recent admission for left hemisphere stroke.   EXAM: MRI HEAD WITHOUT CONTRAST   TECHNIQUE: Multiplanar, multisequence MR imaging was performed. No intravenous contrast was administered.   COMPARISON:  MRI 03/22/2013. MRA 03/23/2013. Report of cerebral angiogram from 04/10/2013 indicates left internal carotid  artery thrombosis.   FINDINGS: There has been progression of the previously identified left hemisphere infarction. Multiple frontal, posterior frontal, basal ganglia, insular, parietal, and occipital lobe subcentimeter lesions with restricted diffusion affect the left hemisphere cortex and subcortical white matter in a linear fashion consistent with a watershed type event due to internal carotid artery occlusion.   There is no evidence for flow related enhancement in the left internal carotid artery skull base or cavernous segments. This represents a change from previous MRI and MRA and is consistent with thrombosis found on angiography.   A small focus of acute infarction affects the right occipital cortex, of unknown significance.   Motion degraded exam with atrophy and chronic microvascular ischemic change. Remote left basal ganglia hemorrhagic infarct re-demonstrated. No evidence for acute hemorrhage on today's examination.   Scalp, calvarium, orbits, sinuses, mastoids, and other visualized extracranial soft tissues grossly unremarkable.   IMPRESSION: Progression of the previously identified left hemisphere multifocal infarcts in a watershed distribution. The left internal carotid artery which was previously stenotic but patent on prior MRI/ MRA from 03/22/2013 is now thrombosed. Subcentimeter right occipital infarct incidentally noted.   Baseline atrophy and chronic microvascular ischemic change without intracranial hemorrhage or mass-effect.     On this occasion it she had been sent home I believe a week before. She was readmitted the hospital with severe hypernatremia with a sodium of 161. She had poor oral intake secondary to dysphagia and recent strokes. The family asked for a PEG tube and this was placed the. It was noted the patient had erosive bulbar duodenitis and was recommended that she remain on a proton pump inhibitor. 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 Patient's sodium was corrected with improvement in her skin cephalopathy. The patient was also noted to be treated for an empiric UTI with IV antibiotics.  Past Medical History  Diagnosis Date  . 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 Diabetes mellitus without complication   . Asthma   . HTN (hypertension)   . Tobacco abuse   . 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 COPD (chronic obstructive pulmonary disease)    Past Surgical History  Procedure Laterality Date  . Appendectomy       reports that she quit smoking about 2 years ago. She does not have any smokeless tobacco history on file. She reports that she does not drink alcohol or use illicit drugs. Patient remains a full code  family history includes Diabetes in her sister; Hyperlipidemia in her sister; Hypertension in her sister.  Review of systems; not possible due to global aphasia  Physical examination; Gen. an unfortunately marked deterioration in this patient is obvious Vitals; O2 sat 97% on 2 L respirations 20 pulse rate 82 Respiratory; decreased air entry over the right lower lobe vs. left however both lobes are reduced. 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 She appears to be globally aphasic. She is awake and apparently smile that some of the staff she recognized beyond this I could see no evidence of meaningful communication. She appears to have functional use of her left arm. She has right arm collegiate. Has some use of the right leg but this is not antigravity her left leg is fixed in full  extension with markedly increased tone Speech; as noted above I think this woman has now global aphasia   Impression/plan #1] patient was admitted to hospital in early October with the MRI showing extension of her previous left hemisphere infarct a. She had a prior right hemisphere infarct. This has left her with only limited function of her left arm. She is a globally aphasic and unable to swallow. She is had a PEG tube placed for nutrition as well as free water supplement. She is now on aspirin 81 mg as well as Plavix 75. I actually think that she was on both of these previously #2) severe hypernatremia presumably now corrected I will recheck this tomorrow #3 duodenitis is now on proton pump inhibitors #4 type 2 diabetes on Glucophage at 500 twice a day #5 hyperlipidemia on Lipitor. #6 UTI in the hospital current reading 2 days of septra #7 now complete PEG tube dependency with Glucerna 1.2 at 50 cc per hour per she has free fluid flushes with 250 mils every 6h #8 COPD she has albuterol puffers I doubt this woman can use these, substituted nebulizers  Patient is severely disabled from bihemispheric strokes. I remember her original MRI she also had stroke changes in the deep gray structures, there is of course a real ethical issue here which I hope was fully discussed with the family before they elected to put tube in place. I will try to revisit this discussion. Per the nursing staff there is still a plan to take this woman home, this would be a monumental undertaking will see what the support is.

## 2013-05-11 LAB — GLUCOSE, CAPILLARY
Glucose-Capillary: 206 mg/dL — ABNORMAL HIGH (ref 70–99)
Glucose-Capillary: 242 mg/dL — ABNORMAL HIGH (ref 70–99)
Glucose-Capillary: 242 mg/dL — ABNORMAL HIGH (ref 70–99)

## 2013-05-12 LAB — GLUCOSE, CAPILLARY: Glucose-Capillary: 231 mg/dL — ABNORMAL HIGH (ref 70–99)

## 2013-05-13 LAB — GLUCOSE, CAPILLARY
Glucose-Capillary: 235 mg/dL — ABNORMAL HIGH (ref 70–99)
Glucose-Capillary: 227 mg/dL — ABNORMAL HIGH (ref 70–99)
Glucose-Capillary: 193 mg/dL — ABNORMAL HIGH (ref 70–99)

## 2013-05-14 LAB — GLUCOSE, CAPILLARY
Glucose-Capillary: 175 mg/dL — ABNORMAL HIGH (ref 70–99)
Glucose-Capillary: 178 mg/dL — ABNORMAL HIGH (ref 70–99)

## 2013-05-15 LAB — GLUCOSE, CAPILLARY
Glucose-Capillary: 182 mg/dL — ABNORMAL HIGH (ref 70–99)
Glucose-Capillary: 216 mg/dL — ABNORMAL HIGH (ref 70–99)
Glucose-Capillary: 191 mg/dL — ABNORMAL HIGH (ref 70–99)

## 2013-05-16 ENCOUNTER — Non-Acute Institutional Stay (SKILLED_NURSING_FACILITY): Payer: Medicare Other | Admitting: Internal Medicine

## 2013-05-16 ENCOUNTER — Ambulatory Visit (HOSPITAL_COMMUNITY): Payer: Medicare Other | Attending: Internal Medicine

## 2013-05-16 DIAGNOSIS — R509 Fever, unspecified: Secondary | ICD-10-CM | POA: Insufficient documentation

## 2013-05-16 DIAGNOSIS — I6350 Cerebral infarction due to unspecified occlusion or stenosis of unspecified cerebral artery: Secondary | ICD-10-CM

## 2013-05-16 DIAGNOSIS — E870 Hyperosmolality and hypernatremia: Secondary | ICD-10-CM

## 2013-05-16 DIAGNOSIS — I639 Cerebral infarction, unspecified: Secondary | ICD-10-CM

## 2013-05-16 DIAGNOSIS — R0989 Other specified symptoms and signs involving the circulatory and respiratory systems: Secondary | ICD-10-CM | POA: Insufficient documentation

## 2013-05-16 DIAGNOSIS — R1319 Other dysphagia: Secondary | ICD-10-CM

## 2013-05-16 LAB — GLUCOSE, CAPILLARY
Glucose-Capillary: 231 mg/dL — ABNORMAL HIGH (ref 70–99)
Glucose-Capillary: 248 mg/dL — ABNORMAL HIGH (ref 70–99)
Glucose-Capillary: 226 mg/dL — ABNORMAL HIGH (ref 70–99)
Glucose-Capillary: 287 mg/dL — ABNORMAL HIGH (ref 70–99)

## 2013-05-16 NOTE — Progress Notes (Signed)
Patient ID: Christine Durham, female   DOB: [birth year], 72 y.o.   MRN: [id number]  This is an acute visit.  Level of care skilled.  Facility [facility].  Chief complaint-acute visit secondary to fever unknown origin.  History of present illness.  Patient is a 72 year old female who's had quite a complicated course recently.  She was in this facility back in September with a new left hemispheric stroke on the background of a previous right hemisphere stroke.  She had some dysarthria and dysphasia but could walk with a walker and feed herself.  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  She was sent home was readmitted to the hospital in mid October with severe hypernatremia with a sodium of 161 fall secondary to volume depletion and dehydration with poor oral intake and failure to thrive.  This was treated with IV fluids and trended down.  She did not have significant improvement with speech therapy and eventually family did decide to place a PEG tube.  Apparently this afternoon she developed a low-grade temp axillary 100.3.  She did appear to have some increased chest congestion and we have ordered an x-ray-her O2 sats appear to run in the high 80s to low 90s.  Patient is a fee sick and cannot really give any review of systems.  Family medical social history has been reviewed per progress note on 05/10/2013.  In hospital discharge summary 05/09/2013.  Medications have been reviewed per MAR.  View of systems-unobtainable secondary to patient's aphasic status.  Physical exam.  Temperature this evening has come down to 98.3 pulse 100 respirations 22 blood pressure 123/51-O2 saturation hovering in the high 80s to low 90s. e is on 2-3 L of oxygen  In general this is a frail elderly female in no distress but very frail lying in bed.  Her skin is warm and dry.  Oropharynx is  membranes appear somewhat dry.  Chest initially when I evaluated her there was diffuse rhonchi and rales-after nebulizer treatment this appeared to clear significantly no labored breathing there is decreased air entry which is not new with her history of COPD.  Heart is regular rate and rhythm borderline tachycardic at 100 without murmur gallop or rub.  Her abdomen PEG site appears to be within normal limits no sign of infection bowel sounds are positive abdomen does not appear to be tender.  GU could not appreciate superpubic tenderness vaginal area appeared within normal limits.  Neurologic she does continue with bitemporal hemianopsia global aphasic-she is aler Does have her left leg in full flexion-appears able to move her right leg a bit as well as left arm although this is limited secondary to patient being in bed t.  She is able to move her left arm.  Speech she does have global aphasia.  Labs.  05/11/2013.  Sodium 137 potassium 4.7 BUN 14 creatinine 0.63.  05/08/2013.  WBC 8.0 hemoglobin 9.7 platelets 233.  Assessment and plan.  #1-fever of unknown origin-at this point would be suspicion for aspiration pneumonia-Will obtain a chest x-ray stat-also will start Augmentin 500 mg twice a day for 7 days.   duo nebs routine every 6 hours for 24 hours and then when necessary.  Also also will have to monitor closely with vital signs every 2 hours x2 every 4 hours x2 and then every shift with pulse ox.  Also will update  a CBC with differential and metabolic panel stat in the morning as clinically she does not appear unstable although very weak and a fragile individual medically  #2-history of hypernatremia we'll update her metabolic panel again she is on tube feedings which hopefully can avert this secondary to her severe dysphagia  #3-CVA late effect-this appears to be quite significant and at baseline--- she is on anticoagulation with Plavix and aspirin.  Addendum-I did  reassess patient that she continues to be relatively a baseline has some chest congestion that does clear with her nebulizers.  Vital signs remained stable she currently is afebrile O2 saturations remained in the low 90s.  We have obtain results of the chest x-ray which shows interim clearing of an intc erstitial prominence that was noted on a prior chest x-ray on October 25.  There was mild atelectasis left lung base.  She will need expedient followup as well as close monitoring of her vital signs.  [id number].-of note greater than 35 minutes spent assessing patient to reassessing patient-and coordinating and formulating a plan of care

## 2013-05-17 ENCOUNTER — Non-Acute Institutional Stay (SKILLED_NURSING_FACILITY): Payer: Medicare Other | Admitting: Internal Medicine

## 2013-05-17 DIAGNOSIS — R509 Fever, unspecified: Secondary | ICD-10-CM

## 2013-05-17 DIAGNOSIS — J189 Pneumonia, unspecified organism: Secondary | ICD-10-CM

## 2013-05-17 LAB — GLUCOSE, CAPILLARY: Glucose-Capillary: 225 mg/dL — ABNORMAL HIGH (ref 70–99)

## 2013-05-18 ENCOUNTER — Non-Acute Institutional Stay (SKILLED_NURSING_FACILITY): Payer: Medicare Other | Admitting: Internal Medicine

## 2013-05-18 ENCOUNTER — Other Ambulatory Visit: Payer: Self-pay

## 2013-05-18 DIAGNOSIS — R509 Fever, unspecified: Secondary | ICD-10-CM

## 2013-05-18 DIAGNOSIS — K117 Disturbances of salivary secretion: Secondary | ICD-10-CM

## 2013-05-18 DIAGNOSIS — D72829 Elevated white blood cell count, unspecified: Secondary | ICD-10-CM | POA: Insufficient documentation

## 2013-05-18 DIAGNOSIS — R682 Dry mouth, unspecified: Secondary | ICD-10-CM

## 2013-05-18 LAB — GLUCOSE, CAPILLARY
Glucose-Capillary: 203 mg/dL — ABNORMAL HIGH (ref 70–99)
Glucose-Capillary: 206 mg/dL — ABNORMAL HIGH (ref 70–99)
Glucose-Capillary: 170 mg/dL — ABNORMAL HIGH (ref 70–99)

## 2013-05-18 NOTE — Progress Notes (Signed)
Patient ID: Christine Durham, female   DOB: 08-Feb-1941, 72 y.o.   MRN: [id number]  This is an acute visit.  Level care skilled.  Facility [facility].  Chief complaint-acute visit followup leukocytosis-fever of unknown origin.  History of present with.  Patient is a frail 72 year old female with a history of CVA with resultant right-sided hemiplegia and dysphagia requiring PEG tube placement.  Earlier this week she developed a temperature that was thought possibly aspiration pneumonia-she was empirically started on Augmentin chest x-ray also was done which showed actually a interim clearing of interstitial prominence and mild atelectasis of the left lung base cannot exclude developing basilar pneumonia.  Blood work was done which was significant for an elevated white count of 17,000 with a elevated granulocytes of 14.1..  Dr. Leanord Hawking to see her yesterday and ordered a urine as well which is pending.  Currently she is afebrile occasionally she is mildly tachycardic but I got a pulse in the high 80s today her O2 sats continued to be in the low 90s which was the case the other day as well.  Family medical social history as been reviewed per discharge note on 05/09/2013.  Medications have been reviewed.  Review of systems essentially unobtainable secondary to patient's aphasic status 3  Physical exam.  Temperature is 97.9 pulse 88 respirations 22 blood pressure 144/64-130/65 in this range recently.  In general this is a frail elderly female in no distress resting comfortably in her Bo Merino chair she actually appears a bit more comfortable than she did the other day.  Skin is warm and dry.  Eyes appear reactive to light.  Oropharynx continues to have somewhat of a mucoid  dried crusting  presentation on her tongue as well as upper palate  Chest has some coarse breath sounds on expiration there is no labored breathing.  Abdomen is soft nontender positive bowel sounds PEG site appears  unremarkable.  Extremities passive movement most mostly of her left upper arm there is no lower extremity edema.  Labs.  Chest x-ray as noted above.  05/17/2013.  WBC 17.0 hemoglobin 10.0 platelets 347.  Granulocytes absolute 14.1.  Sodium 136 potassium 4.2 BUN 22 creatinine 0.53.  Assessment and plan.  #1-leukocytosis-question aspiration pneumonia-she continues on Augmentin clinically this appears to be stable is at risk for aspiration with her history of CVA dysphagia status post PEG tube-she appears to be more comfortable today than when I saw HER-2 days ago.  Updated labs including a CBC with differential and a basic metabolic panel have been ordered for next Monday.  Also a urine has been sent for culture and sensitivites   #2-mouth issues-will treat with Peridex mouthwash may swab two times aday---monitor....  .  [id number]

## 2013-05-19 LAB — GLUCOSE, CAPILLARY
Glucose-Capillary: 217 mg/dL — ABNORMAL HIGH (ref 70–99)
Glucose-Capillary: 224 mg/dL — ABNORMAL HIGH (ref 70–99)
Glucose-Capillary: 186 mg/dL — ABNORMAL HIGH (ref 70–99)
Glucose-Capillary: 185 mg/dL — ABNORMAL HIGH (ref 70–99)

## 2013-05-20 LAB — GLUCOSE, CAPILLARY
Glucose-Capillary: 236 mg/dL — ABNORMAL HIGH (ref 70–99)
Glucose-Capillary: 259 mg/dL — ABNORMAL HIGH (ref 70–99)
Glucose-Capillary: 156 mg/dL — ABNORMAL HIGH (ref 70–99)

## 2013-05-21 LAB — GLUCOSE, CAPILLARY
Glucose-Capillary: 257 mg/dL — ABNORMAL HIGH (ref 70–99)
Glucose-Capillary: 218 mg/dL — ABNORMAL HIGH (ref 70–99)
Glucose-Capillary: 175 mg/dL — ABNORMAL HIGH (ref 70–99)

## 2013-05-22 ENCOUNTER — Non-Acute Institutional Stay (SKILLED_NURSING_FACILITY): Payer: Medicare Other | Admitting: Internal Medicine

## 2013-05-22 LAB — GLUCOSE, CAPILLARY: Glucose-Capillary: 158 mg/dL — ABNORMAL HIGH (ref 70–99)

## 2013-05-22 NOTE — Progress Notes (Signed)
Patient ID: Christine Durham, female   DOB: 1941/02/06, 72 y.o.   MRN: [id number] Facility; [facility] Chief complaint hyperglycemia History; patient is a type II diabetic on metformin 500 twice a day. She was recently readmitted to the facility with a new left hemisphere stroke. She now has extreme neurologic disability. We saw her last week for a nonspecific fever she completed a course of Augmentin and from this point of view she seems to be better. Lab work from today shows a white count of 8.4 a hemoglobin of 9 point2, her basic metabolic panel shows a sodium of 136 potassium of 3.5 total CO2 of 32 her glucose is 237 BUN 17 creatinine 0.48.  Regards to her blood sugars they're consistently well into the 200s and seemed to be going up from when she was readmitted here. Yesterday her blood sugars were 267/218/299/175.  Impression/plan #1 type 2 diabetes I suspect this woman is going to need insulin I am going to start her on Lantus. She is now PEG tube dependent which I am sure is contributing to this. The metformin can continue.

## 2013-05-23 LAB — GLUCOSE, CAPILLARY
Glucose-Capillary: 171 mg/dL — ABNORMAL HIGH (ref 70–99)
Glucose-Capillary: 194 mg/dL — ABNORMAL HIGH (ref 70–99)
Glucose-Capillary: 168 mg/dL — ABNORMAL HIGH (ref 70–99)

## 2013-05-24 LAB — GLUCOSE, CAPILLARY: Glucose-Capillary: 123 mg/dL — ABNORMAL HIGH (ref 70–99)

## 2013-05-24 NOTE — Progress Notes (Addendum)
Patient ID: Christine Durham, female   DOB: Jan 09, 1941, 72 y.o.   MRN: [id number]           PROGRESS NOTE  DATE:  05/17/2013    FACILITY: [facility]    LEVEL OF CARE:   SNF   Acute Visit   CHIEF COMPLAINT:  Fever.    HISTORY OF PRESENT ILLNESS:  This patient was seen yesterday with low-grade fevers.    Lab work shows a white count of 17, a hemoglobin of 10, 83% neutrophils.  A basic metabolic panel is essentially normal other than a glucose of 282.    Chest x-ray shows interim clearing of an interstitial prominence, mild atelectasis at the left lung base.  A follow-up chest x-ray to exclude developing basilar pneumonia would be considered.    REVIEW OF SYSTEMS:  Otherwise not possible.    PHYSICAL EXAMINATION:   VITAL SIGNS:   O2 SATURATIONS:  96%.   PULSE:  98.   RESPIRATIONS:  24.   GENERAL APPEARANCE:  The patient is not in overt distress.   CHEST/RESPIRATORY:  Decreased air entry at both bases, but no crackles or wheezes.    CARDIOVASCULAR:  CARDIAC:   Heart sounds are tachy.  However, there are no murmurs or gallops.  She does not appear to be dehydrated.   GASTROINTESTINAL:  ABDOMEN:   PEG site looks fine.   LIVER/SPLEEN/KIDNEYS:  No liver, no spleen.  No tenderness.   GENITOURINARY:  BLADDER:   No clear suprapubic or costovertebral angle tenderness.   SKIN:  INSPECTION:  No obvious issues here that would contribute to her fever or white count.    ASSESSMENT/PLAN:  Fever in the setting of a leukocytosis.  She will need a urine sent.  This is possibly a developing aspiration pneumonitis and she was started on Augmentin yesterday.  This seems reasonable to me.  There is no other obvious source of this fever.   Most likely an aspiration pneumonitis  CPT CODE: 24401

## 2013-05-25 LAB — GLUCOSE, CAPILLARY: Glucose-Capillary: 161 mg/dL — ABNORMAL HIGH (ref 70–99)

## 2013-05-26 LAB — GLUCOSE, CAPILLARY: Glucose-Capillary: 157 mg/dL — ABNORMAL HIGH (ref 70–99)

## 2013-05-27 LAB — GLUCOSE, CAPILLARY
Glucose-Capillary: 138 mg/dL — ABNORMAL HIGH (ref 70–99)
Glucose-Capillary: 128 mg/dL — ABNORMAL HIGH (ref 70–99)
Glucose-Capillary: 121 mg/dL — ABNORMAL HIGH (ref 70–99)

## 2013-05-28 LAB — GLUCOSE, CAPILLARY
Glucose-Capillary: 186 mg/dL — ABNORMAL HIGH (ref 70–99)
Glucose-Capillary: 141 mg/dL — ABNORMAL HIGH (ref 70–99)
Glucose-Capillary: 159 mg/dL — ABNORMAL HIGH (ref 70–99)
Glucose-Capillary: 157 mg/dL — ABNORMAL HIGH (ref 70–99)

## 2013-05-29 LAB — GLUCOSE, CAPILLARY

## 2013-05-30 LAB — GLUCOSE, CAPILLARY
Glucose-Capillary: 118 mg/dL — ABNORMAL HIGH (ref 70–99)
Glucose-Capillary: 139 mg/dL — ABNORMAL HIGH (ref 70–99)
Glucose-Capillary: 145 mg/dL — ABNORMAL HIGH (ref 70–99)
Glucose-Capillary: 140 mg/dL — ABNORMAL HIGH (ref 70–99)

## 2013-05-31 LAB — GLUCOSE, CAPILLARY: Glucose-Capillary: 129 mg/dL — ABNORMAL HIGH (ref 70–99)

## 2013-06-01 LAB — GLUCOSE, CAPILLARY
Glucose-Capillary: 134 mg/dL — ABNORMAL HIGH (ref 70–99)
Glucose-Capillary: 171 mg/dL — ABNORMAL HIGH (ref 70–99)
Glucose-Capillary: 163 mg/dL — ABNORMAL HIGH (ref 70–99)
Glucose-Capillary: 164 mg/dL — ABNORMAL HIGH (ref 70–99)
Glucose-Capillary: 149 mg/dL — ABNORMAL HIGH (ref 70–99)

## 2013-06-02 LAB — GLUCOSE, CAPILLARY
Glucose-Capillary: 185 mg/dL — ABNORMAL HIGH (ref 70–99)
Glucose-Capillary: 139 mg/dL — ABNORMAL HIGH (ref 70–99)
Glucose-Capillary: 159 mg/dL — ABNORMAL HIGH (ref 70–99)

## 2013-06-03 ENCOUNTER — Ambulatory Visit (HOSPITAL_COMMUNITY)
Admit: 2013-06-03 | Discharge: 2013-06-03 | Disposition: A | Discharge status: Home / Self Care | Payer: Medicare Other | Attending: Internal Medicine | Admitting: Internal Medicine

## 2013-06-03 DIAGNOSIS — R0989 Other specified symptoms and signs involving the circulatory and respiratory systems: Secondary | ICD-10-CM | POA: Insufficient documentation

## 2013-06-03 LAB — GLUCOSE, CAPILLARY
Glucose-Capillary: 152 mg/dL — ABNORMAL HIGH (ref 70–99)
Glucose-Capillary: 182 mg/dL — ABNORMAL HIGH (ref 70–99)

## 2013-06-05 ENCOUNTER — Other Ambulatory Visit: Payer: Self-pay

## 2013-06-05 ENCOUNTER — Emergency Department (HOSPITAL_COMMUNITY): Payer: Medicare Other

## 2013-06-05 ENCOUNTER — Inpatient Hospital Stay (HOSPITAL_COMMUNITY): Payer: Medicare Other

## 2013-06-05 ENCOUNTER — Encounter (HOSPITAL_COMMUNITY): Payer: Self-pay | Admitting: Emergency Medicine

## 2013-06-05 ENCOUNTER — Inpatient Hospital Stay (HOSPITAL_COMMUNITY)
Admission: EM | Admit: 2013-06-05 | Discharge: 2013-06-08 | DRG: 190 | Disposition: A | Discharge status: Skilled Nursing Facility | Payer: Medicare Other | Attending: Internal Medicine | Admitting: Internal Medicine

## 2013-06-05 DIAGNOSIS — IMO0002 Reserved for concepts with insufficient information to code with codable children: Secondary | ICD-10-CM

## 2013-06-05 DIAGNOSIS — G9340 Encephalopathy, unspecified: Secondary | ICD-10-CM

## 2013-06-05 DIAGNOSIS — Z7902 Long term (current) use of antithrombotics/antiplatelets: Secondary | ICD-10-CM

## 2013-06-05 DIAGNOSIS — I428 Other cardiomyopathies: Secondary | ICD-10-CM | POA: Diagnosis present

## 2013-06-05 DIAGNOSIS — J441 Chronic obstructive pulmonary disease with (acute) exacerbation: Principal | ICD-10-CM | POA: Diagnosis present

## 2013-06-05 DIAGNOSIS — R1310 Dysphagia, unspecified: Secondary | ICD-10-CM | POA: Diagnosis present

## 2013-06-05 DIAGNOSIS — I6529 Occlusion and stenosis of unspecified carotid artery: Secondary | ICD-10-CM | POA: Diagnosis present

## 2013-06-05 DIAGNOSIS — J690 Pneumonitis due to inhalation of food and vomit: Secondary | ICD-10-CM

## 2013-06-05 DIAGNOSIS — J9600 Acute respiratory failure, unspecified whether with hypoxia or hypercapnia: Secondary | ICD-10-CM | POA: Diagnosis present

## 2013-06-05 DIAGNOSIS — I2589 Other forms of chronic ischemic heart disease: Secondary | ICD-10-CM | POA: Diagnosis present

## 2013-06-05 DIAGNOSIS — E119 Type 2 diabetes mellitus without complications: Secondary | ICD-10-CM | POA: Diagnosis present

## 2013-06-05 DIAGNOSIS — J189 Pneumonia, unspecified organism: Secondary | ICD-10-CM | POA: Diagnosis present

## 2013-06-05 DIAGNOSIS — J45909 Unspecified asthma, uncomplicated: Secondary | ICD-10-CM

## 2013-06-05 DIAGNOSIS — I69920 Aphasia following unspecified cerebrovascular disease: Secondary | ICD-10-CM

## 2013-06-05 DIAGNOSIS — Z794 Long term (current) use of insulin: Secondary | ICD-10-CM

## 2013-06-05 DIAGNOSIS — Z8249 Family history of ischemic heart disease and other diseases of the circulatory system: Secondary | ICD-10-CM

## 2013-06-05 DIAGNOSIS — I509 Heart failure, unspecified: Secondary | ICD-10-CM | POA: Diagnosis present

## 2013-06-05 DIAGNOSIS — D72829 Elevated white blood cell count, unspecified: Secondary | ICD-10-CM | POA: Diagnosis present

## 2013-06-05 DIAGNOSIS — I5042 Chronic combined systolic (congestive) and diastolic (congestive) heart failure: Secondary | ICD-10-CM | POA: Diagnosis present

## 2013-06-05 DIAGNOSIS — T380X5A Adverse effect of glucocorticoids and synthetic analogues, initial encounter: Secondary | ICD-10-CM | POA: Diagnosis present

## 2013-06-05 DIAGNOSIS — Z823 Family history of stroke: Secondary | ICD-10-CM

## 2013-06-05 DIAGNOSIS — Z7401 Bed confinement status: Secondary | ICD-10-CM

## 2013-06-05 DIAGNOSIS — I5023 Acute on chronic systolic (congestive) heart failure: Secondary | ICD-10-CM

## 2013-06-05 DIAGNOSIS — I252 Old myocardial infarction: Secondary | ICD-10-CM

## 2013-06-05 DIAGNOSIS — I10 Essential (primary) hypertension: Secondary | ICD-10-CM | POA: Diagnosis present

## 2013-06-05 DIAGNOSIS — D649 Anemia, unspecified: Secondary | ICD-10-CM | POA: Diagnosis present

## 2013-06-05 DIAGNOSIS — Z833 Family history of diabetes mellitus: Secondary | ICD-10-CM

## 2013-06-05 DIAGNOSIS — E43 Unspecified severe protein-calorie malnutrition: Secondary | ICD-10-CM | POA: Diagnosis present

## 2013-06-05 DIAGNOSIS — I69959 Hemiplegia and hemiparesis following unspecified cerebrovascular disease affecting unspecified side: Secondary | ICD-10-CM

## 2013-06-05 DIAGNOSIS — I959 Hypotension, unspecified: Secondary | ICD-10-CM | POA: Diagnosis present

## 2013-06-05 DIAGNOSIS — R5381 Other malaise: Secondary | ICD-10-CM | POA: Diagnosis present

## 2013-06-05 DIAGNOSIS — R627 Adult failure to thrive: Secondary | ICD-10-CM

## 2013-06-05 DIAGNOSIS — I639 Cerebral infarction, unspecified: Secondary | ICD-10-CM | POA: Diagnosis present

## 2013-06-05 DIAGNOSIS — I2510 Atherosclerotic heart disease of native coronary artery without angina pectoris: Secondary | ICD-10-CM | POA: Diagnosis present

## 2013-06-05 DIAGNOSIS — Z87891 Personal history of nicotine dependence: Secondary | ICD-10-CM

## 2013-06-05 HISTORY — DX: Dysphagia, unspecified: R13.10

## 2013-06-05 HISTORY — DX: Ischemic cardiomyopathy: I25.5

## 2013-06-05 HISTORY — DX: Anemia, unspecified: D64.9

## 2013-06-05 LAB — COMPREHENSIVE METABOLIC PANEL
ALT: 14 U/L (ref 0–35)
Albumin: 3.6 g/dL (ref 3.5–5.2)
BUN: 22 mg/dL (ref 6–23)
CO2: 30 mEq/L (ref 19–32)
Chloride: 97 mEq/L (ref 96–112)
Creatinine, Ser: 0.5 mg/dL (ref 0.50–1.10)
GFR calc Af Amer: >90 mL/min (ref >90)
GFR calc non Af Amer: >90 mL/min (ref >90)
Total Bilirubin: 0.3 mg/dL (ref 0.3–1.2)

## 2013-06-05 LAB — GLUCOSE, CAPILLARY
Glucose-Capillary: 146 mg/dL — ABNORMAL HIGH (ref 70–99)
Glucose-Capillary: 160 mg/dL — ABNORMAL HIGH (ref 70–99)
Glucose-Capillary: 167 mg/dL — ABNORMAL HIGH (ref 70–99)
Glucose-Capillary: 150 mg/dL — ABNORMAL HIGH (ref 70–99)
Glucose-Capillary: 286 mg/dL — ABNORMAL HIGH (ref 70–99)
Glucose-Capillary: 220 mg/dL — ABNORMAL HIGH (ref 70–99)
Glucose-Capillary: 172 mg/dL — ABNORMAL HIGH (ref 70–99)

## 2013-06-05 LAB — TROPONIN I
Troponin I: <0.3 ng/mL (ref <0.30)
Troponin I: <0.3 ng/mL (ref <0.30)

## 2013-06-05 LAB — CBC
HCT: 33.9 % — ABNORMAL LOW (ref 36.0–46.0)
HCT: 30.9 % — ABNORMAL LOW (ref 36.0–46.0)
MCHC: 32 g/dL (ref 30.0–36.0)
MCV: 92.1 fL (ref 78.0–100.0)
MCV: 92 fL (ref 78.0–100.0)
Platelets: 371 10*3/uL (ref 150–400)
RDW: 14.1 % (ref 11.5–15.5)
RDW: 14.2 % (ref 11.5–15.5)
WBC: 13 10*3/uL — ABNORMAL HIGH (ref 4.0–10.5)

## 2013-06-05 LAB — CREATININE, SERUM
Creatinine, Ser: 0.47 mg/dL — ABNORMAL LOW (ref 0.50–1.10)
GFR calc non Af Amer: >90 mL/min (ref >90)

## 2013-06-05 LAB — URINE MICROSCOPIC-ADD ON

## 2013-06-05 LAB — URINALYSIS, ROUTINE W REFLEX MICROSCOPIC
Ketones, ur: NEGATIVE mg/dL
Nitrite: NEGATIVE
Protein, ur: NEGATIVE mg/dL
Urobilinogen, UA: 0.2 mg/dL (ref 0.0–1.0)

## 2013-06-05 LAB — STREP PNEUMONIAE URINARY ANTIGEN: Strep Pneumo Urinary Antigen: NEGATIVE

## 2013-06-05 LAB — D-DIMER, QUANTITATIVE: D-Dimer, Quant: 1.27 ug/mL-FEU — ABNORMAL HIGH (ref 0.00–0.48)

## 2013-06-05 LAB — BLOOD GAS, ARTERIAL
Acid-Base Excess: 4.2 mmol/L — ABNORMAL HIGH (ref 0.0–2.0)
Bicarbonate: 29 mEq/L — ABNORMAL HIGH (ref 20.0–24.0)
O2 Saturation: 100 %
TCO2: 26.8 mmol/L (ref 0–100)
pO2, Arterial: 359 mmHg — ABNORMAL HIGH (ref 80.0–100.0)

## 2013-06-05 LAB — LACTIC ACID, PLASMA: Lactic Acid, Venous: 3 mmol/L — ABNORMAL HIGH (ref 0.5–2.2)

## 2013-06-05 LAB — PRO B NATRIURETIC PEPTIDE: Pro B Natriuretic peptide (BNP): 94.8 pg/mL (ref 0–125)

## 2013-06-05 MED ORDER — ALBUTEROL SULFATE (5 MG/ML) 0.5% IN NEBU
5.0000 mg | INHALATION_SOLUTION | Freq: Once | RESPIRATORY_TRACT | Status: AC
  Administered 2013-06-05: 5 mg via RESPIRATORY_TRACT
  Filled 2013-06-05: qty 1

## 2013-06-05 MED ORDER — ENOXAPARIN SODIUM 40 MG/0.4ML ~~LOC~~ SOLN
40.0000 mg | SUBCUTANEOUS | Status: DC
  Administered 2013-06-05 – 2013-06-08 (×4): 40 mg via SUBCUTANEOUS
  Filled 2013-06-05 (×4): qty 0.4

## 2013-06-05 MED ORDER — IPRATROPIUM BROMIDE 0.02 % IN SOLN
0.5000 mg | RESPIRATORY_TRACT | Status: DC
  Administered 2013-06-05 – 2013-06-08 (×19): 0.5 mg via RESPIRATORY_TRACT
  Filled 2013-06-05 (×19): qty 2.5

## 2013-06-05 MED ORDER — ALBUTEROL SULFATE (5 MG/ML) 0.5% IN NEBU
5.0000 mg | INHALATION_SOLUTION | Freq: Once | RESPIRATORY_TRACT | Status: AC
  Administered 2013-06-05: 5 mg via RESPIRATORY_TRACT

## 2013-06-05 MED ORDER — IOHEXOL 350 MG/ML SOLN
100.0000 mL | Freq: Once | INTRAVENOUS | Status: AC | PRN
  Administered 2013-06-05: 100 mL via INTRAVENOUS

## 2013-06-05 MED ORDER — FREE WATER
250.0000 mL | Freq: Four times a day (QID) | Status: DC
  Administered 2013-06-05 – 2013-06-08 (×13): 250 mL

## 2013-06-05 MED ORDER — CHLORHEXIDINE GLUCONATE 0.12 % MT SOLN
15.0000 mL | Freq: Two times a day (BID) | OROMUCOSAL | Status: DC
  Administered 2013-06-05 – 2013-06-08 (×7): 15 mL via OROMUCOSAL
  Filled 2013-06-05 (×7): qty 15

## 2013-06-05 MED ORDER — INSULIN ASPART 100 UNIT/ML ~~LOC~~ SOLN
0.0000 [IU] | Freq: Four times a day (QID) | SUBCUTANEOUS | Status: DC
  Administered 2013-06-05 (×2): 5 [IU] via SUBCUTANEOUS
  Administered 2013-06-06: 2 [IU] via SUBCUTANEOUS
  Administered 2013-06-06: 3 [IU] via SUBCUTANEOUS

## 2013-06-05 MED ORDER — POLYETHYLENE GLYCOL 3350 17 G PO PACK: 17.0000 g | PACK | Freq: Every day | ORAL | Status: DC | PRN

## 2013-06-05 MED ORDER — SODIUM CHLORIDE 0.9 % IJ SOLN
3.0000 mL | Freq: Two times a day (BID) | INTRAMUSCULAR | Status: DC
  Administered 2013-06-05 – 2013-06-07 (×4): 3 mL via INTRAVENOUS

## 2013-06-05 MED ORDER — PIPERACILLIN-TAZOBACTAM 3.375 G IVPB
3.3750 g | Freq: Once | INTRAVENOUS | Status: AC
  Administered 2013-06-05: 3.375 g via INTRAVENOUS
  Filled 2013-06-05: qty 50

## 2013-06-05 MED ORDER — METHYLPREDNISOLONE SODIUM SUCC 125 MG IJ SOLR
60.0000 mg | Freq: Four times a day (QID) | INTRAMUSCULAR | Status: DC
  Administered 2013-06-05 – 2013-06-06 (×3): 60 mg via INTRAVENOUS
  Filled 2013-06-05 (×3): qty 2

## 2013-06-05 MED ORDER — PRO-STAT SUGAR FREE PO LIQD
30.0000 mL | Freq: Every day | ORAL | Status: DC
  Administered 2013-06-05 – 2013-06-08 (×4): 30 mL
  Filled 2013-06-05 (×4): qty 30

## 2013-06-05 MED ORDER — METHYLPREDNISOLONE SODIUM SUCC 125 MG IJ SOLR
125.0000 mg | Freq: Once | INTRAMUSCULAR | Status: AC
  Administered 2013-06-05: 125 mg via INTRAVENOUS
  Filled 2013-06-05: qty 2

## 2013-06-05 MED ORDER — HYDRALAZINE HCL 20 MG/ML IJ SOLN: 10.0000 mg | INTRAMUSCULAR | Status: DC | PRN

## 2013-06-05 MED ORDER — VANCOMYCIN HCL IN DEXTROSE 1-5 GM/200ML-% IV SOLN
1000.0000 mg | Freq: Once | INTRAVENOUS | Status: AC
  Administered 2013-06-05: 1000 mg via INTRAVENOUS
  Filled 2013-06-05: qty 200

## 2013-06-05 MED ORDER — GLUCERNA 1.2 CAL PO LIQD
1000.0000 mL | ORAL | Status: DC
  Administered 2013-06-05 – 2013-06-08 (×4): 1000 mL
  Filled 2013-06-05 (×5): qty 1000

## 2013-06-05 MED ORDER — ACETAMINOPHEN 160 MG/5ML PO SOLN
500.0000 mg | ORAL | Status: DC | PRN
  Administered 2013-06-06 (×2): 500 mg
  Filled 2013-06-05 (×2): qty 20.3

## 2013-06-05 MED ORDER — PANTOPRAZOLE SODIUM 40 MG PO PACK
40.0000 mg | PACK | Freq: Every day | ORAL | Status: DC
  Administered 2013-06-05 – 2013-06-08 (×4): 40 mg
  Filled 2013-06-05 (×7): qty 20

## 2013-06-05 MED ORDER — METHYLPREDNISOLONE SODIUM SUCC 125 MG IJ SOLR
60.0000 mg | Freq: Two times a day (BID) | INTRAMUSCULAR | Status: DC
  Administered 2013-06-05: 60 mg via INTRAVENOUS
  Filled 2013-06-05: qty 2

## 2013-06-05 MED ORDER — ALBUTEROL SULFATE (5 MG/ML) 0.5% IN NEBU
INHALATION_SOLUTION | RESPIRATORY_TRACT | Status: AC
  Filled 2013-06-05: qty 1

## 2013-06-05 MED ORDER — PIPERACILLIN-TAZOBACTAM 3.375 G IVPB
3.3750 g | Freq: Three times a day (TID) | INTRAVENOUS | Status: DC
  Administered 2013-06-05: 3.375 g via INTRAVENOUS
  Filled 2013-06-05 (×5): qty 50

## 2013-06-05 MED ORDER — INSULIN GLARGINE 100 UNIT/ML ~~LOC~~ SOLN
10.0000 [IU] | Freq: Every day | SUBCUTANEOUS | Status: DC
  Administered 2013-06-05: 10 [IU] via SUBCUTANEOUS
  Filled 2013-06-05: qty 0.1

## 2013-06-05 MED ORDER — ATORVASTATIN CALCIUM 20 MG PO TABS
20.0000 mg | ORAL_TABLET | Freq: Every day | ORAL | Status: DC
  Administered 2013-06-05 – 2013-06-07 (×3): 20 mg
  Filled 2013-06-05 (×3): qty 1

## 2013-06-05 MED ORDER — SODIUM CHLORIDE 0.9 % IV SOLN: INTRAVENOUS | Status: DC

## 2013-06-05 MED ORDER — BUDESONIDE 0.5 MG/2ML IN SUSP
0.5000 mg | Freq: Two times a day (BID) | RESPIRATORY_TRACT | Status: DC
  Administered 2013-06-05 – 2013-06-08 (×6): 0.5 mg via RESPIRATORY_TRACT
  Filled 2013-06-05 (×10): qty 2

## 2013-06-05 MED ORDER — INSULIN ASPART 100 UNIT/ML ~~LOC~~ SOLN: 0.0000 [IU] | Freq: Four times a day (QID) | SUBCUTANEOUS | Status: DC

## 2013-06-05 MED ORDER — SODIUM CHLORIDE 0.9 % IV SOLN
INTRAVENOUS | Status: AC
  Administered 2013-06-05: 10:00:00 via INTRAVENOUS

## 2013-06-05 MED ORDER — VANCOMYCIN HCL 500 MG IV SOLR
500.0000 mg | Freq: Two times a day (BID) | INTRAVENOUS | Status: DC
  Administered 2013-06-05 – 2013-06-06 (×3): 500 mg via INTRAVENOUS
  Filled 2013-06-05 (×4): qty 500

## 2013-06-05 MED ORDER — ALBUTEROL SULFATE (5 MG/ML) 0.5% IN NEBU
2.5000 mg | INHALATION_SOLUTION | RESPIRATORY_TRACT | Status: DC
  Administered 2013-06-05 – 2013-06-08 (×19): 2.5 mg via RESPIRATORY_TRACT
  Filled 2013-06-05 (×19): qty 0.5

## 2013-06-05 MED ORDER — PIPERACILLIN-TAZOBACTAM 3.375 G IVPB
3.3750 g | Freq: Three times a day (TID) | INTRAVENOUS | Status: DC
  Administered 2013-06-05 – 2013-06-07 (×5): 3.375 g via INTRAVENOUS
  Filled 2013-06-05 (×6): qty 50

## 2013-06-05 MED ORDER — CLOPIDOGREL BISULFATE 75 MG PO TABS
75.0000 mg | ORAL_TABLET | Freq: Every day | ORAL | Status: DC
Start: 2013-06-06 — End: 2013-06-08
  Administered 2013-06-06 – 2013-06-08 (×3): 75 mg
  Filled 2013-06-05 (×4): qty 1

## 2013-06-05 MED ORDER — BISACODYL 10 MG RE SUPP: 10.0000 mg | Freq: Every day | RECTAL | Status: DC | PRN

## 2013-06-05 MED ORDER — ADULT MULTIVITAMIN LIQUID CH
5.0000 mL | Freq: Every day | ORAL | Status: DC
  Administered 2013-06-05 – 2013-06-08 (×4): 5 mL
  Filled 2013-06-05 (×5): qty 5

## 2013-06-05 MED ORDER — ASPIRIN 81 MG PO CHEW
81.0000 mg | CHEWABLE_TABLET | Freq: Every day | ORAL | Status: DC
  Administered 2013-06-05 – 2013-06-08 (×4): 81 mg
  Filled 2013-06-05 (×4): qty 1

## 2013-06-05 NOTE — Progress Notes (Signed)
Pt taken off BIPAP and place on 40%  V. Mask to be escort to CT scan of the chest. spo2 100 hr 100 rr 20 no respiratory distress. I accomplished xray to ct to monitor patient to insure pt wouldn't become into any distress pt maintain sats and rr rates within normal limits  Remain on v.mask and continue to monitor without the night

## 2013-06-05 NOTE — Care Management Note (Signed)
    Page 1 of 1   06/05/2013     11:17:59 AM   CARE MANAGEMENT NOTE 06/05/2013  Patient:  Christine Durham, Christine Durham   Account Number:  [id number]  Date Initiated:  06/05/2013  Documentation initiated by:  Sharrie Rothman  Subjective/Objective Assessment:   Pt admitted from [facility] with COPD and asp pneumonia. Pt will return to facility at discharge.     Action/Plan:   CSW to arrange discharge to facility when medically stable.   Anticipated DC Date:  06/12/2013   Anticipated DC Plan:  SKILLED NURSING FACILITY  In-house referral  Clinical Social Worker      DC Planning Services  CM consult      Choice offered to / List presented to:             Status of service:  Completed, signed off Medicare Important Message given?   (If response is "NO", the following Medicare IM given date fields will be blank) Date Medicare IM given:   Date Additional Medicare IM given:    Discharge Disposition:  SKILLED NURSING FACILITY  Per UR Regulation:    If discussed at Long Length of Stay Meetings, dates discussed:    Comments:  06/05/13 1115 Arlyss Queen, RN BSN CM

## 2013-06-05 NOTE — Progress Notes (Signed)
INITIAL NUTRITION ASSESSMENT  DOCUMENTATION CODES Per approved criteria  -Severe malnutrition in the context of chronic illness   INTERVENTION: Resume home TF regimen if MD agrees Glucerna 1.2 advance to goal rate of 50 ml/hr and ProStat to 30 ml/day via PEG .  Tube feeding regimen at goal rate providing: 1540 kcal, 87 grams protein, and 966 ml H2O. Adequte to meet 100% of estimated energy, protein and fluid daily.  Free water 250 q 6 hr provides additional 1000 ml water daily.  Add MVI daily via PEG RD will follow nutrition care  NUTRITION DIAGNOSIS: Inadequate oral intake ongoing related to previous stroke as evidenced by NPO status for safety.   Goal: Pt to meet >/= 90% of their estimated nutrition needs   Monitor:  Nutrition support measures, tolerance, labs and changes in status   Reason for Assessment: Pt admitted with PEG tube feeding  72 y.o. female  Admitting Dx: COPD with acute exacerbation  ASSESSMENT: Pt is from [facility]. She has hx of CVA and is s/p PEG placement (20 fr. LUQ) 05/05/13.  EN has been resumed at 20 ml/hr with ProStat 30 ml daily and MVI. Her wt has increased 2.5 #, 2.2% x 30 days. Desired given her hx of unplanned wt loss and malnutrition.   Patient Active Problem List   Diagnosis Date Noted  . COPD with acute exacerbation 06/05/2013  . Normocytic anemia   . Leukocytosis, unspecified 05/18/2013  . Fever, unspecified 05/16/2013  . Protein-calorie malnutrition, severe 05/01/2013  . Acute encephalopathy 04/30/2013  . Acute renal failure 04/30/2013  . Hypernatremia 04/30/2013  . Generalized weakness 04/30/2013  . Adult failure to thrive 04/30/2013  . possible Aspiration pneumonia 04/13/2013  . 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 Acute ischemic stroke 03/22/2013  . Dysphagia 03/22/2013  . UTI (urinary tract infection) 03/22/2013  . COPD (chronic obstructive pulmonary disease) 07/04/2012  .  Physical deconditioning 07/04/2012  . CVA (cerebral infarction) 07/04/2012  . Asthma   . Tobacco abuse 07/01/2012  . Acute respiratory failure 06/28/2012  . NICM (nonischemic cardiomyopathy) 06/28/2012  . Hypotension 06/28/2012  . Non-ST elevation myocardial infarction (NSTEMI), initial care episode - Type 2 secondary to respiratory issues 06/28/2012  . Diabetes mellitus without complication   . Acute on chronic systolic CHF (congestive heart failure) 06/27/2012    Height: Ht Readings from Last 1 Encounters:  06/05/13 5\' 7"  (1.702 m)    Weight: Wt Readings from Last 1 Encounters:  06/05/13 121 lb 7.6 oz (55.1 kg)    Ideal Body Weight: 135# (61.3 kg)  % Ideal Body Weight: 90%  Wt Readings from Last 10 Encounters:  06/05/13 121 lb 7.6 oz (55.1 kg)  05/09/13 119 lb 7.8 oz (54.2 kg)  05/09/13 119 lb 7.8 oz (54.2 kg)  04/12/13 123 lb 10.9 oz (56.1 kg)  04/10/13 133 lb (60.328 kg)  04/10/13 133 lb (60.328 kg)  03/29/13 128 lb (58.06 kg)  03/25/13 128 lb 14.4 oz (58.469 kg)  07/04/12 122 lb 12.7 oz (55.7 kg)  07/04/12 129 lb 8 oz (58.741 kg)    Usual Body Weight: 130-133#  % Usual Body Weight: 93%  BMI:  Body mass index is 19.02 kg/(m^2).normal range  Estimated Nutritional Needs: Kcal: 1500-1800 Protein: 70-80 gr Fluid: >1600 ml daily  Skin: skin tear to elbow  Diet Order: NPO  EDUCATION NEEDS: -No education needs identified at this time   Intake/Output Summary (Last 24 hours) at 06/05/13 1208 Last data  filed at 06/05/13 0700  Gross per 24 hour  Intake    250 ml  Output      0 ml  Net    250 ml    Last BM: 06/05/13  Labs:   Recent Labs Lab 06/05/13 0300 06/05/13 1033  NA 139  --   K 4.4  --   CL 97  --   CO2 30  --   BUN 22  --   CREATININE 0.50 0.47*  CALCIUM 10.8*  --   GLUCOSE 215*  --     CBG (last 3)   Recent Labs  06/04/13 0951 06/04/13 1453 06/04/13 1851  GLUCAP 167* 150* 136*    Scheduled Meds: . ipratropium  0.5 mg  Nebulization Q4H   And  . albuterol  2.5 mg Nebulization Q4H  . aspirin  81 mg Per Tube Daily  . atorvastatin  20 mg Per Tube QHS  . budesonide (PULMICORT) nebulizer solution  0.5 mg Nebulization BID  . chlorhexidine  15 mL Mouth/Throat BID  . [START ON 06/06/2013] clopidogrel  75 mg Per Tube Q breakfast  . enoxaparin (LOVENOX) injection  40 mg Subcutaneous Q24H  . feeding supplement (GLUCERNA 1.2 CAL)  1,000 mL Per Tube Q24H  . feeding supplement (PRO-STAT SUGAR FREE 64)  30 mL Per Tube Daily  . free water  250 mL Per Tube Q6H  . insulin aspart  0-9 Units Subcutaneous Q6H  . insulin glargine  10 Units Subcutaneous QHS  . methylPREDNISolone (SOLU-MEDROL) injection  60 mg Intravenous BID  . multivitamin  5 mL Per Tube Daily  . pantoprazole sodium  40 mg Per Tube Daily  . piperacillin-tazobactam (ZOSYN)  IV  3.375 g Intravenous Q8H  . sodium chloride  3 mL Intravenous Q12H  . vancomycin  500 mg Intravenous Q12H  . vancomycin  1,000 mg Intravenous Once    Continuous Infusions: . sodium chloride 75 mL/hr at 06/05/13 1478    Past Medical History  Diagnosis Date  . Stroke 2011    with right hemiparesis and does not use her left leg  . Diabetes mellitus without complication   . Asthma   . HTN (hypertension)   . Tobacco abuse   . 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 COPD (chronic obstructive pulmonary disease)   . Dysphagia     s/p PEG tube  . Ischemic cardiomyopathy     EF 25% cath 04/2013, grade 1 DD  . Normocytic anemia     Past Surgical History  Procedure Laterality Date  . Appendectomy    . Peg placement N/A 05/05/2013    Procedure: PERCUTANEOUS ENDOSCOPIC GASTROSTOMY (PEG) PLACEMENT;  Surgeon: Malissa Hippo, MD;  Location: [facility];  Service: Endoscopy;  Laterality: N/A;  . Esophagogastroduodenoscopy N/A 05/05/2013    Procedure: ESOPHAGOGASTRODUODENOSCOPY (EGD);  Surgeon: Malissa Hippo, MD;  Location: [facility];  Service: Endoscopy;  Laterality: N/A;     Royann Shivers MS,RD,CSG,LDN Office: [phone] Pager: [phone]

## 2013-06-05 NOTE — ED Provider Notes (Signed)
CSN: [id number]     Arrival date & time 06/05/13  0257 History   First MD Initiated Contact with Patient 06/05/13 0252     Chief Complaint  Patient presents with  . Respiratory Distress   (Consider location/radiation/quality/duration/timing/severity/associated sxs/prior Treatment) HPI History provided by EMS, family bedside and the nursing home report. Previous strokes, history of COPD, aphasic. Difficulty breathing worse over the last 24 hours despite inhalers at nursing home. EMS was called tonight for respiratory distress. Was given breathing treatment in route. On arrival to emergency department has wheezes throughout with significant respiratory distress and requiring BiPAP. Patient unable to provide any history. Level V caveat applies. Past Medical History  Diagnosis Date  . 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 Diabetes mellitus without complication   . Asthma   . HTN (hypertension)   . Tobacco abuse   . 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 COPD (chronic obstructive pulmonary disease)    Past Surgical History  Procedure Laterality Date  . Appendectomy    . Peg placement N/A 05/05/2013    Procedure: PERCUTANEOUS ENDOSCOPIC GASTROSTOMY (PEG) PLACEMENT;  Surgeon: Malissa Hippo, MD;  Location: [facility];  Service: Endoscopy;  Laterality: N/A;  . Esophagogastroduodenoscopy N/A 05/05/2013    Procedure: ESOPHAGOGASTRODUODENOSCOPY (EGD);  Surgeon: Malissa Hippo, MD;  Location: [facility];  Service: Endoscopy;  Laterality: N/A;   Family History  Problem Relation Age of Onset  . Diabetes Sister   . Hyperlipidemia Sister   . Hypertension Sister    History  Substance Use Topics  . Smoking status: Former Smoker    Quit date: 06/21/2010  . Smokeless tobacco: Not on file  . Alcohol Use: No   OB History   Grav Para Term Preterm Abortions TAB SAB Ect Mult Living                 Review of Systems Unable to obtain review of systems. Level V caveat applies -  baseline aphasia Allergies  Review of patient's allergies indicates no known allergies.  Home Medications   Current Outpatient Rx  Name  Route  Sig  Dispense  Refill  . albuterol (PROVENTIL HFA;VENTOLIN HFA) 108 (90 BASE) MCG/ACT inhaler   Inhalation   Inhale 2 puffs into the lungs every 6 (six) hours as needed for wheezing.   1 Inhaler   2   . Amino Acids-Protein Hydrolys (FEEDING SUPPLEMENT, PRO-STAT SUGAR FREE 64,) LIQD   Per Tube   Place 30 mLs into feeding tube daily.   900 mL   0   . amLODipine (NORVASC) 5 MG tablet   Oral   Take 2 tablets (10 mg total) by mouth daily.   60 tablet   1   . aspirin EC 81 MG tablet   Oral   Take 1 tablet (81 mg total) by mouth daily.         Marland Kitchen atorvastatin (LIPITOR) 10 MG tablet   Oral   Take 2 tablets (20 mg total) by mouth at bedtime.         . clopidogrel (PLAVIX) 75 MG tablet   Oral   Take 1 tablet (75 mg total) by mouth daily with breakfast.         . metFORMIN (GLUCOPHAGE) 500 MG tablet   Oral   Take 1 tablet (500 mg total) by mouth 2 (two) times daily with a meal.   60 tablet   1   .  Multiple Vitamin (MULTIVITAMIN) LIQD   Per Tube   Place 5 mLs into feeding tube daily.         . Nutritional Supplements (FEEDING SUPPLEMENT, GLUCERNA 1.2 CAL,) LIQD   Per Tube   Place 1,000 mLs into feeding tube daily. At 50 ml/h   10000 mL   0   . pantoprazole sodium (PROTONIX) 40 mg/20 mL PACK   Per Tube   Place 20 mLs (40 mg total) into feeding tube daily.   30 each   0   . Water For Irrigation, Sterile (FREE WATER) SOLN   Per Tube   Place 250 mLs into feeding tube every 6 (six) hours.          BP 146/80  Pulse 120  Resp 28  SpO2 100% Physical Exam  Constitutional: She appears well-developed and well-nourished.  HENT:  Head: Normocephalic and atraumatic.  Eyes: EOM are normal. Pupils are equal, round, and reactive to light.  Neck: Neck supple.  Cardiovascular: Regular rhythm and intact distal pulses.    Tachycardic  Pulmonary/Chest:  Tachypnea with coarse bilateral breath sounds and expiratory wheezes  Abdominal: Soft. She exhibits no distension.  Musculoskeletal:  No unilateral swelling  Neurological:  Awake and alert.  aphasic  Skin: Skin is warm and dry.    ED Course  Procedures (including critical care time) Labs Review Labs Reviewed  CBC - Abnormal; Notable for the following:    WBC 13.0 (*)    RBC 3.68 (*)    Hemoglobin 10.9 (*)    HCT 33.9 (*)    All other components within normal limits  COMPREHENSIVE METABOLIC PANEL - Abnormal; Notable for the following:    Glucose, Bld 215 (*)    Calcium 10.8 (*)    All other components within normal limits  LACTIC ACID, PLASMA - Abnormal; Notable for the following:    Lactic Acid, Venous 3.0 (*)    All other components within normal limits  URINALYSIS, ROUTINE W REFLEX MICROSCOPIC - Abnormal; Notable for the following:    Leukocytes, UA SMALL (*)    All other components within normal limits  BLOOD GAS, ARTERIAL - Abnormal; Notable for the following:    pCO2 arterial 50.3 (*)    pO2, Arterial 359.0 (*)    Bicarbonate 29.0 (*)    Acid-Base Excess 4.2 (*)    All other components within normal limits  URINE MICROSCOPIC-ADD ON - Abnormal; Notable for the following:    Bacteria, UA FEW (*)    All other components within normal limits  URINE CULTURE  TROPONIN I  PRO B NATRIURETIC PEPTIDE   Imaging Review Dg Chest 1 View  06/03/2013   CLINICAL DATA:  Increase congestion.  EXAM: CHEST - 1 VIEW  COMPARISON:  05/16/2013  FINDINGS: Minimal linear densities in the lung bases, likely scarring, stable. Heart is normal size. No acute airspace opacities or effusions. No acute bony abnormality.  IMPRESSION: No active disease.   Electronically Signed   By: Charlett Nose M.D.   On: 06/03/2013 20:39   Dg Chest Portable 1 View  06/05/2013   CLINICAL DATA:  Respiratory distress, history of stroke and diabetes, hypertension.  EXAM: PORTABLE  CHEST - 1 VIEW  COMPARISON:  Chest radiograph June 03, 2013  FINDINGS: Cardiomediastinal silhouette is nonsuspicious, mildly calcified aortic knob. Similar mild chronic interstitial changes and pulmonary hyperexpansion without superimposed pleural effusions or focal consolidations. No pneumothorax. Osteopenia. Multiple EKG lines overlie the patient and may obscure subtle underlying pathology. Soft  tissue planes are nonsuspicious.  IMPRESSION: Emphysematous changes without superimposed acute cardiopulmonary process.   Electronically Signed   By: Awilda Metro   On: 06/05/2013 03:35     Date: 06/05/2013  Rate: 130  Rhythm: sinus tachycardia  QRS Axis: normal  Intervals: normal  ST/T Wave abnormalities: nonspecific ST changes  Conduction Disutrbances:none  Narrative Interpretation:   Old EKG Reviewed: Sinus tach otherwise no significant changes  CRITICAL CARE Performed by: Sunnie Nielsen Total critical care time: 40 Critical care time was exclusive of separately billable procedures and treating other patients. Critical care was necessary to treat or prevent imminent or life-threatening deterioration. Critical care was time spent personally by me on the following activities: development of treatment plan with patient and/or surrogate as well as nursing, discussions with consultants, evaluation of patient's response to treatment, examination of patient, obtaining history from patient or surrogate, ordering and performing treatments and interventions, ordering and review of laboratory studies, ordering and review of radiographic studies, pulse oximetry and re-evaluation of patient's condition. BiPAP. Albuterol and IV solu Medrol. IV antibiotics. Serial evaluations. Discussed with Dr. Sharl Ma to admit SDU. Family updated bedside - patient is currently full code.    MDM  Diagnosis: COPD exacerbation/ respiratory distress  Requiring BiPAP Chest x-ray labs reviewed. EKG shows sinus tach no acute  changes otherwise Medications provided - wheezing improving Medical admission    Sunnie Nielsen, MD 06/05/13 [phone]

## 2013-06-05 NOTE — H&P (Signed)
[facility] History and Physical  MARTY SADLOWSKI GNF:[id number] DOB: 12/21/1940 DOA: 06/05/2013  Referring physician:  Sunnie Nielsen PCP:  Ignatius Specking., MD   Chief Complaint:  Shortness of breath  HPI:  The patient is a 72 y.o. 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 She also has type 2 diabetes, COPD, probable aspiration pneumonia, history of NSTEMI 2, chronic grade 1 diastolic dysfunction and systolic heart failure with EF of 25% per 04/2013 catheterization, severe dysphasia with PEG tube placement approximately one month ago on tube feeds who presents with progressive shortness of breath from [facility] skilled nursing facility.  The patient was last at their baseline health several days ago.  Her niece provides the history. Several days ago she was at her baseline mentation, able to communicate with meaningful glances, alert, making good eye contact, in no respiratory distress.  Yesterday, she seemed to be more progress, and she was given several breathing treatments without much improvement. Overnight she became increasingly Bryton Romagnoli of breath, had wheezes, and a low-grade temperature of around 100F. She constantly has gurgling noises in the back of her throat, however she has not had x-ray evidence of aspiration pneumonia. She was transported by EMS to the emergency department on BiPAP, and received the nebulizer treatments through BiPAP which improved her breathing.  In the emergency department, her temperature was within normal limits, pulse 1:15, respirations 23, blood pressure 151/86, oxygen saturation to 100% on BiPAP. Her initial blood gas on BiPAP 15/5 with 75% FiO2 was pH 7.38, PCO2 50, PaO2 359.  Her chest x-ray demonstrated stable scarring, but no obvious infiltrate or pulmonary edema or vascular congestion.   She had emphysematous changes. Her proBNP and troponin were negative.  Her white blood cell count was elevated at 13, mild anemia with hemoglobin 10.9, BNP within normal limits except for mildly elevated calcium of 10.8, glucose 215. She was given an additional albuterol nebulizer treatments, Solu-Medrol, gentamicin and Zosyn for presumptive aspiration and healthcare associated pneumonia.  Review of Systems:  Patient unable to give review of systems secondary to the aphasia.   Past Medical History  Diagnosis Date  . Stroke 2011    with right hemiparesis and does not use her left leg  . Diabetes mellitus without complication   . Asthma   . HTN (hypertension)   . Tobacco abuse   . 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 COPD (chronic obstructive pulmonary disease)   . Dysphagia     s/p PEG tube  . Ischemic cardiomyopathy     EF 25% cath 04/2013, grade 1 DD  . Normocytic anemia    Past Surgical History  Procedure Laterality Date  . Appendectomy    . Peg placement N/A 05/05/2013    Procedure: PERCUTANEOUS ENDOSCOPIC GASTROSTOMY (PEG) PLACEMENT;  Surgeon: Malissa Hippo, MD;  Location: [facility];  Service: Endoscopy;  Laterality: N/A;  . Esophagogastroduodenoscopy N/A 05/05/2013    Procedure: ESOPHAGOGASTRODUODENOSCOPY (EGD);  Surgeon: Malissa Hippo, MD;  Location: [facility];  Service: Endoscopy;  Laterality: N/A;   Social History:  reports that she quit smoking about a year ago. Her smoking use included Cigarettes. She smoked 0.50 packs per day. She has quit using smokeless tobacco. She reports that she does not drink alcohol or use illicit drugs. [facility] SN[facility], normally on 2L.  Bedbound  and uses a special WC to get around.  Gets continuous tube feeds.    No Known Allergies  Family History  Problem Relation Age of Onset  . Diabetes Sister   . Hyperlipidemia Sister   . Hypertension Sister   . COPD Neg Hx   . Stroke Mother      Prior to Admission medications    Medication Sig Start Date End Date Taking? Authorizing Provider  acetaminophen (TYLENOL) 160 MG/5ML liquid Place 500 mg into feeding tube every 4 (four) hours as needed for fever or pain.   Yes Historical Provider, MD  albuterol (PROVENTIL) (2.5 MG/3ML) 0.083% nebulizer solution Take 2.5 mg by nebulization every 4 (four) hours as needed for wheezing or shortness of breath.   Yes Historical Provider, MD  amLODipine (NORVASC) 5 MG tablet Place 10 mg into feeding tube daily.   Yes Historical Provider, MD  chlorhexidine (PERIDEX) 0.12 % solution Use as directed 15 mLs in the mouth or throat 2 (two) times daily. Please swab in mouth and suction residual.   Yes Historical Provider, MD  insulin glargine (LANTUS) 100 UNIT/ML injection Inject 10 Units into the skin at bedtime.   Yes Historical Provider, MD  Amino Acids-Protein Hydrolys (FEEDING SUPPLEMENT, PRO-STAT SUGAR FREE 64,) LIQD Place 30 mLs into feeding tube daily. 05/09/13   Rodolph Bong, MD  atorvastatin (LIPITOR) 10 MG tablet Place 20 mg into feeding tube at bedtime. 03/25/13   Erick Blinks, MD  clopidogrel (PLAVIX) 75 MG tablet Place 75 mg into feeding tube daily with breakfast. 03/25/13   Erick Blinks, MD  metFORMIN (GLUCOPHAGE) 500 MG tablet Place 500 mg into feeding tube 2 (two) times daily with a meal. 07/08/12   Mcarthur Rossetti Angiulli, PA-C  Multiple Vitamin (MULTIVITAMIN) LIQD Place 5 mLs into feeding tube daily. 05/09/13   Rodolph Bong, MD  Nutritional Supplements (FEEDING SUPPLEMENT, GLUCERNA 1.2 CAL,) LIQD Place 1,000 mLs into feeding tube daily. At 50 ml/h 05/09/13   Rodolph Bong, MD  pantoprazole sodium (PROTONIX) 40 mg/20 mL PACK Place 20 mLs (40 mg total) into feeding tube daily. 05/09/13   Rodolph Bong, MD  Water For Irrigation, Sterile (FREE WATER) SOLN Place 250 mLs into feeding tube every 6 (six) hours. 05/09/13   Rodolph Bong, MD   Physical Exam: Filed Vitals:   06/05/13 0646 06/05/13 0700 06/05/13 0735  06/05/13 0800  BP:  101/44  103/45  Pulse:      Temp:  98.3 F (36.8 C) 97.6 F (36.4 C)   TempSrc:  Oral Axillary   Resp:  18  20  Height: 5\' 7"  (1.702 m)     Weight: 55.1 kg (121 lb 7.6 oz)     SpO2:  100%       General:  Thin female, no acute distress, eyes partly open and initially slow to respond to tactile stimulus, currently on nasal cannula at 2 L  Eyes:  PERRL, anicteric, non-injected.  ENT:  Nares clear.  OP with dry mucous membranes, and thick coating on the, and the hard palate, and no evidence of thrush  Neck:  Supple without TM or JVD.    Lymph:  No cervical, supraclavicular, or submandibular LAD.  Cardiovascular:  RRR, normal S1, S2, without m/r/g.  2+ pulses, warm extremities  Respiratory: Diminished bilateral breath sounds without obvious rales, wheezes, rhonchi, without increased WOB.  Abdomen:  NABS.  Soft mildly distended, nontender, PEG tube in place    Skin:  No rashes or  focal lesions.  Musculoskeletal:  Normal bulk and tone.  No LE edema.  Psychiatric:  Was wide awake a Jaleil Renwick while ago, currently asleep and somewhat difficult to arouse, however she did awaken and make eye contact and gestures toward her daughter with her left hand for a few minutes, then she fell back asleep  Neurologic:  Right hemiparesis, left leg 2/5, left arm 3/5, aphasic  Labs on Admission:  Basic Metabolic Panel:  Recent Labs Lab 06/05/13 0300  NA 139  K 4.4  CL 97  CO2 30  GLUCOSE 215*  BUN 22  CREATININE 0.50  CALCIUM 10.8*   Liver Function Tests:  Recent Labs Lab 06/05/13 0300  AST 15  ALT 14  ALKPHOS 112  BILITOT 0.3  PROT 8.2  ALBUMIN 3.6   No results found for this basename: LIPASE, AMYLASE,  in the last 168 hours No results found for this basename: AMMONIA,  in the last 168 hours CBC:  Recent Labs Lab 06/05/13 0300  WBC 13.0*  HGB 10.9*  HCT 33.9*  MCV 92.1  PLT 371   Cardiac Enzymes:  Recent Labs Lab 06/05/13 0306  TROPONINI <0.30     BNP (last 3 results)  Recent Labs  06/26/12 2320 06/05/13 0306  PROBNP 150.2* 94.8   CBG:  Recent Labs Lab 06/03/13 1621 06/03/13 2206 06/04/13 0951 06/04/13 1453 06/04/13 1851  GLUCAP 146* 160* 167* 150* 136*    Radiological Exams on Admission: Dg Chest 1 View  06/03/2013   CLINICAL DATA:  Increase congestion.  EXAM: CHEST - 1 VIEW  COMPARISON:  05/16/2013  FINDINGS: Minimal linear densities in the lung bases, likely scarring, stable. Heart is normal size. No acute airspace opacities or effusions. No acute bony abnormality.  IMPRESSION: No active disease.   Electronically Signed   By: Charlett Nose M.D.   On: 06/03/2013 20:39   Dg Chest Portable 1 View  06/05/2013   CLINICAL DATA:  Respiratory distress, history of stroke and diabetes, hypertension.  EXAM: PORTABLE CHEST - 1 VIEW  COMPARISON:  Chest radiograph June 03, 2013  FINDINGS: Cardiomediastinal silhouette is nonsuspicious, mildly calcified aortic knob. Similar mild chronic interstitial changes and pulmonary hyperexpansion without superimposed pleural effusions or focal consolidations. No pneumothorax. Osteopenia. Multiple EKG lines overlie the patient and may obscure subtle underlying pathology. Soft tissue planes are nonsuspicious.  IMPRESSION: Emphysematous changes without superimposed acute cardiopulmonary process.   Electronically Signed   By: Awilda Metro   On: 06/05/2013 03:35    EKG: Independently reviewed. Sinus tachycardia, T wave inversion in aVL, V1 and V2, which are similar to prior. There are no ST segment elevations or depressions concerning for acute ischemia.    Assessment/Plan Principal Problem:   COPD with acute exacerbation Active Problems:   Acute respiratory failure   Diabetes mellitus without complication   NICM (nonischemic cardiomyopathy)   Physical deconditioning   CVA (cerebral infarction)   Dysphagia   Protein-calorie malnutrition, severe   Leukocytosis, unspecified    Normocytic anemia  ---  Acute hypoxic respiratory failure likely secondary to acute COPD exacerbation.  May be exacerbated due to aspiration event or allergies (has frequently had exacerbations around this time of year).  CXR without evidence of pulmonary edema and pro-BNP low.  No leg swelling and SOB improving already with duonebs, so PE less likely.  If persistent tachycardia and tachypnea, however, consider D-dimer/CTa -  Solumedrol 60mg  IV q12h -  Budesonide 0.5mg  BID -  Duoneb q4h -  Abx as below -  Wean oxygen as tolerated -  Continue PPI therapy.    Possible aspiration pneumonia given chronic severe dysphagia and recent low grade fever with leukocytosis.  DDx also includes HCAP secondary to hospitalization one month ago. - Vancomycin and Zosyn -  Urine strep pneumo and Legionella antigen -  Consider narrowing to Augmentin -  With complete a minimum of 3 days of antibiotics for COPD exacerbation even if suspicion for pneumonia is low  CAD with chronic systolic and diastolic heart failure with ejection fraction of 25% and grade 1 diastolic dysfunction -  Continue aspirin, statin, Plavix -  Hold Norvasc for now secondary to relative hypotension -  Judicious use of IV fluids -  Telemetry and cycle troponins  Type 2 diabetes, A1c 8 03/2013 -  Continue Lantus with every 6 hour low-dose sliding scale insulin  CVA with right hemiparesis and left weakness, aphagia, dysphagia.  At risk for decub ulcers -  Use foam dressing, air mattress  ICA disease -  Dr. Durwin Nora is vascular surgeon  Dysphagia with tube feeds -  Continue tube feeds with free water -  Per labs, may be a little dehydrated due to losses from resp distress so will give fluids IV today also  Normocytic anemia -  Check iron studies, B12, folate.   -  No TSH due to acute illness and steroids   Bladder probably colonized with yeast.    Diet:  Tube feeds with supplements and free water Access:  PIV, PEG IVF:  NS  36ml/h x 4 hours Proph:  lovenox  Code Status: FULL.  Spoke with niece who stated that the patient had stated she wanted everything done to extend her life prior to becoming ill and the family still feels that she has some quality of life.   Family Communication: spoke with patient's niece Disposition Plan: Admit to stepdown  Time spent: 60 min Renae Fickle [facility] Pager [phone]  If 7PM-7AM, please contact night-coverage [email or web address] Password [facility] 06/05/2013, 9:57 AM

## 2013-06-05 NOTE — Progress Notes (Signed)
UR chart review completed.  

## 2013-06-05 NOTE — Progress Notes (Signed)
ANTIBIOTIC CONSULT NOTE - INITIAL  Pharmacy Consult for Vancomycin and Zosyn Indication: pneumonia  No Known Allergies  Patient Measurements: Height: 5\' 7"  (170.2 cm) Weight: 121 lb 7.6 oz (55.1 kg) IBW/kg (Calculated) : 61.6  Vital Signs: Temp: 97.6 F (36.4 C) (11/24 0735) Temp src: Axillary (11/24 0735) BP: 103/45 mmHg (11/24 0800) Pulse Rate: 103 (11/24 0600) Intake/Output from previous day: 11/23 0701 - 11/24 0700 In: 250 [IV Piggyback:250] Out: -  Intake/Output from this shift:    Labs:  Recent Labs  06/05/13 0300  WBC 13.0*  HGB 10.9*  PLT 371  CREATININE 0.50   Estimated Creatinine Clearance: 56.9 ml/min (by C-G formula based on Cr of 0.5). No results found for this basename: Rolm Gala, VANCORANDOM, GENTTROUGH, GENTPEAK, GENTRANDOM, TOBRATROUGH, TOBRAPEAK, TOBRARND, AMIKACINPEAK, AMIKACINTROU, AMIKACIN,  in the last 72 hours   Microbiology: Recent Results (from the past 720 hour(s))  URINE CULTURE     Status: None   Collection Time    05/06/13  2:51 PM      Result Value Range Status   Specimen Description URINE, CATHETERIZED   Final   Special Requests NONE   Final   Culture  Setup Time     Final   Value: 05/06/2013 23:37     Performed at [facility]   Colony Count     Final   Value: NO GROWTH     Performed at [facility]   Culture     Final   Value: NO GROWTH     Performed at [facility]   Report Status 05/08/2013 FINAL   Final   Medical History: Past Medical History  Diagnosis Date  . Stroke 2011    with right hemiparesis and does not use her left leg  . Diabetes mellitus without complication   . Asthma   . HTN (hypertension)   . Tobacco abuse   . 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 COPD (chronic obstructive pulmonary disease)   . Dysphagia     s/p PEG tube   Medications:  Scheduled:  . ipratropium  0.5 mg Nebulization Q4H   And  . albuterol  2.5 mg Nebulization Q4H  . aspirin  81 mg Per Tube  Daily  . atorvastatin  20 mg Per Tube QHS  . budesonide (PULMICORT) nebulizer solution  0.5 mg Nebulization BID  . chlorhexidine  15 mL Mouth/Throat BID  . [START ON 06/06/2013] clopidogrel  75 mg Per Tube Q breakfast  . enoxaparin (LOVENOX) injection  40 mg Subcutaneous Q24H  . feeding supplement (GLUCERNA 1.2 CAL)  1,000 mL Per Tube Q24H  . feeding supplement (PRO-STAT SUGAR FREE 64)  30 mL Per Tube Daily  . free water  250 mL Per Tube Q6H  . insulin aspart  0-9 Units Subcutaneous Q6H  . insulin glargine  10 Units Subcutaneous QHS  . methylPREDNISolone (SOLU-MEDROL) injection  60 mg Intravenous BID  . multivitamin  5 mL Per Tube Daily  . pantoprazole sodium  40 mg Per Tube Daily  . piperacillin-tazobactam (ZOSYN)  IV  3.375 g Intravenous Q8H  . sodium chloride  3 mL Intravenous Q12H  . vancomycin  500 mg Intravenous Q12H  . vancomycin  1,000 mg Intravenous Once   Assessment: 72yo female with h/o stroke, COPD and aphasia.  Pt admitted with worsening SOB and suspected aspiration pna.  Estimated Creatinine Clearance: 56.9 ml/min (by C-G formula based on Cr of 0.5)..  Goal of Therapy:  Vancomycin trough level 15-20 mcg/ml  Plan:  Vancomycin 1000mg  IV now x 1 dose (loading dose) then Vancomycin 500mg  IV q12hrs Check trough at steady state Zosyn 3.375gm IV q8h to be infused over 4 hrs Monitor labs, renal fxn, and cultures  Valrie Hart A 06/05/2013,9:31 AM

## 2013-06-05 NOTE — Progress Notes (Signed)
Spoke with RN.  Patient placed back on bipap 2/2 increased wob this am with tachypnea to the 40s.  Has been doing much better on bipap today.  Have ordered prn bipap as well as d-dimer to rule out PE.  If d-dimer is > 700 (Age x 10 criteria), will order CTa chest.  Appreciate excellent nursing care and assistance.

## 2013-06-05 NOTE — Clinical Social Work Psychosocial (Signed)
    Clinical Social Work Department BRIEF PSYCHOSOCIAL ASSESSMENT 06/05/2013  Patient:  Christine Durham, Christine Durham     Account Number:  [id number]     Admit date:  06/05/2013  Clinical Social Worker:  Santa Genera, CLINICAL SOCIAL WORKER  Date/Time:  06/05/2013 11:00 AM  Referred by:  CSW  Date Referred:  06/05/2013 Referred for  SNF Placement   Other Referral:   Interview type:  Family Other interview type:    PSYCHOSOCIAL DATA Living Status:  FACILITY Admitted from facility:  [facility] Level of care:  Skilled Nursing Facility Primary support name:  Charlaine Spirito-Webb Primary support relationship to patient:  SIBLING Degree of support available:   Family very supportive, [facility] willing to take back at discharge if family is agreeable to paying bed hold and copays if patient does not have Medicaid.    CURRENT CONCERNS Current Concerns  Post-Acute Placement   Other Concerns:    SOCIAL WORK ASSESSMENT / PLAN CSW met w patient at bedside, patient not able to participate in assessment.  Niece at bedside provided information, sister is in [place] caring for another family member who had a stroke.  Niece states family would like patient to return to [facility] at discharge, family has  been pleased w care received there and expressed no concerns.    Spoke w [facility] admissions, says family has not been willing to pay bed hold in past, but [facility] is  willing to take patient back at discharge pending bed availability. Patient has Medicaid application pending, filed approx 30 days ago w help from [place] CSW.   This would assist w Medicare copays and deductibles.    RN raised question about mouth care, spoke w [facility] about this. [facility] states they "have been reallly addressing this," Q2H mouth care has been ordered for patient, facility states they have been performing this regularly.    CSW will continue to work w family and facility re discharge planning.   Assessment/plan status:  Psychosocial  Support/Ongoing Assessment of Needs Other assessment/ plan:   Information/referral to community resources:   None needed at this time, return to [facility] at discharge    PATIENT'S/FAMILY'S RESPONSE TO PLAN OF CARE: Family wants patient to return to [facility] if possible.     Santa Genera, LCSW Clinical Social Worker [phone])

## 2013-06-05 NOTE — ED Notes (Signed)
Resident of [facility], presents by rescue with acute respiratory distress

## 2013-06-06 DIAGNOSIS — R1310 Dysphagia, unspecified: Secondary | ICD-10-CM

## 2013-06-06 LAB — LEGIONELLA ANTIGEN, URINE

## 2013-06-06 LAB — CBC
HCT: 25.6 % — ABNORMAL LOW (ref 36.0–46.0)
HCT: 26.7 % — ABNORMAL LOW (ref 36.0–46.0)
Hemoglobin: 8.3 g/dL — ABNORMAL LOW (ref 12.0–15.0)
MCH: 29.7 pg (ref 26.0–34.0)
MCH: 29.4 pg (ref 26.0–34.0)
MCHC: 32.4 g/dL (ref 30.0–36.0)
MCHC: 32.2 g/dL (ref 30.0–36.0)
MCV: 91.8 fL (ref 78.0–100.0)
Platelets: 301 10*3/uL (ref 150–400)
Platelets: 324 10*3/uL (ref 150–400)
RBC: 2.93 MIL/uL — ABNORMAL LOW (ref 3.87–5.11)
RDW: 14.1 % (ref 11.5–15.5)
RDW: 14 % (ref 11.5–15.5)
WBC: 14.9 10*3/uL — ABNORMAL HIGH (ref 4.0–10.5)

## 2013-06-06 LAB — BASIC METABOLIC PANEL
BUN: 15 mg/dL (ref 6–23)
CO2: 29 mEq/L (ref 19–32)
Calcium: 9.8 mg/dL (ref 8.4–10.5)
Creatinine, Ser: 0.44 mg/dL — ABNORMAL LOW (ref 0.50–1.10)
GFR calc Af Amer: >90 mL/min (ref >90)
GFR calc non Af Amer: >90 mL/min (ref >90)
Glucose, Bld: 283 mg/dL — ABNORMAL HIGH (ref 70–99)
Potassium: 3.4 mEq/L — ABNORMAL LOW (ref 3.5–5.1)
Sodium: 139 mEq/L (ref 135–145)

## 2013-06-06 LAB — GLUCOSE, CAPILLARY
Glucose-Capillary: 244 mg/dL — ABNORMAL HIGH (ref 70–99)
Glucose-Capillary: 233 mg/dL — ABNORMAL HIGH (ref 70–99)
Glucose-Capillary: 265 mg/dL — ABNORMAL HIGH (ref 70–99)
Glucose-Capillary: 73 mg/dL (ref 70–99)

## 2013-06-06 LAB — URINE CULTURE: Colony Count: >=100000

## 2013-06-06 LAB — IRON AND TIBC
Iron: 44 ug/dL (ref 42–135)
Saturation Ratios: 21 % (ref 20–55)
TIBC: 206 ug/dL — ABNORMAL LOW (ref 250–470)
UIBC: 162 ug/dL (ref 125–400)

## 2013-06-06 LAB — TRANSFERRIN: Transferrin: 156 mg/dL — ABNORMAL LOW (ref 200–360)

## 2013-06-06 MED ORDER — INSULIN GLARGINE 100 UNIT/ML ~~LOC~~ SOLN
15.0000 [IU] | Freq: Every day | SUBCUTANEOUS | Status: DC
  Administered 2013-06-06 – 2013-06-07 (×2): 15 [IU] via SUBCUTANEOUS
  Filled 2013-06-06 (×3): qty 0.15

## 2013-06-06 MED ORDER — METHYLPREDNISOLONE SODIUM SUCC 125 MG IJ SOLR
60.0000 mg | Freq: Two times a day (BID) | INTRAMUSCULAR | Status: DC
  Administered 2013-06-06: 60 mg via INTRAVENOUS
  Filled 2013-06-06 (×2): qty 2

## 2013-06-06 MED ORDER — INSULIN ASPART 100 UNIT/ML ~~LOC~~ SOLN
0.0000 [IU] | Freq: Four times a day (QID) | SUBCUTANEOUS | Status: DC
  Administered 2013-06-06 (×2): 5 [IU] via SUBCUTANEOUS
  Administered 2013-06-06: 8 [IU] via SUBCUTANEOUS
  Administered 2013-06-07: 2 [IU] via SUBCUTANEOUS
  Administered 2013-06-07: 5 [IU] via SUBCUTANEOUS
  Administered 2013-06-07: 3 [IU] via SUBCUTANEOUS
  Administered 2013-06-08 (×2): 5 [IU] via SUBCUTANEOUS
  Administered 2013-06-08: 2 [IU] via SUBCUTANEOUS

## 2013-06-06 NOTE — Progress Notes (Signed)
Inpatient Diabetes Program Recommendations  AACE/ADA: New Consensus Statement on Inpatient Glycemic Control (2013)  Target Ranges:  Prepandial:   less than 140 mg/dL      Peak postprandial:   less than 180 mg/dL (1-2 hours)      Critically ill patients:  140 - 180 mg/dL   Results for Christine Durham, Christine Durham (MRN [id number]) as of 06/06/2013 07:46  Ref. Range 06/04/2013 09:51 06/04/2013 14:53 06/04/2013 18:51 06/05/2013 13:53 06/05/2013 16:29 06/05/2013 21:19 06/06/2013 00:00 06/06/2013 05:01  Glucose-Capillary Latest Range: 70-99 mg/dL 409 (H) 811 (H) 914 (H) 286 (H) 220 (H) 172 (H) 197 (H) 244 (H)   Inpatient Diabetes Program Recommendations Insulin - Basal: Please consider increasing Lantus to 15 units QHS. Correction (SSI): Please consider increasing Novolog correction to moderate scale Q6H.  Thanks, Orlando Penner, RN, MSN, CCRN Diabetes Coordinator Inpatient Diabetes Program [phone] (Team Pager) [phone] ([facility] office) [phone] [facility] office)

## 2013-06-06 NOTE — Progress Notes (Addendum)
[facility] PROGRESS NOTE  Christine Durham HKV:[id number] DOB: January 07, 1941 DOA: 06/05/2013 PCP: Ignatius Specking., MD  Assessment/Plan  Acute hypoxic respiratory failure likely secondary to acute COPD exacerbation and HCAP vs. Aspiration PNA.  CTa chest neg for PE - Wean Solumedrol 60mg  IV to q12h  - Budesonide 0.5mg  BID  - Duoneb q4h  - Abx as below  - Wean oxygen as tolerated  - Continue PPI therapy  Aspiration pneumonia vs. HCAP  (hospitalization one month ago)  - Continue vancomycin and Zosyn  - Urine strep pneumo and Legionella antigen were cancelled yesterday.  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 At risk for decub ulcers  - Use foam dressing, air mattress   ICA disease, stable.  Dr. Durwin Nora is vascular surgeon.  Dysphagia with tube feeds  - Continue tube feeds with free water  -  Appreciate nutrition assistance  Leukocytosis, stable.  -  Continue antibiotics  Normocytic anemia, hgb trending down  - Monitor for bleeding - CBC - Iron studies, B12, folate pending - No TSH due to acute illness and steroids   Bladder probably colonized with yeast.   Diet: Tube feeds with supplements and free water  Access: PIV, PEG  IVF: OFF Proph: lovenox   Code Status: FULL.   Family Communication: spoke with patient's niece today Disposition Plan: Continue in stepdown as patient still on venturi mask and may need  bipap intermittently.    Consultants:  None  Procedures:  CXR  CT angio chest   Antibiotics:  Vancomycin 11/24 >>  Zosyn 11/24 >>   HPI/Subjective:  Aphasic.    Objective: Filed Vitals:   06/06/13 0400 06/06/13 0600 06/06/13 0736 06/06/13 0752  BP: 125/54 108/47    Pulse:      Temp: 98 F (36.7 C)   97.3 F (36.3 C)  TempSrc: Axillary   Oral  Resp: 17 17    Height:      Weight:      SpO2:   98%     Intake/Output Summary (Last 24 hours) at 06/06/13 5643 Last data filed at 06/06/13 0600  Gross per 24 hour  Intake 2986.58 ml  Output   2400 ml  Net 586.58 ml   Filed Weights   06/05/13 0646  Weight: 55.1 kg (121 lb 7.6 oz)    Exam:   General:  Thin adult female, aphasic, No acute distress  HEENT:  NCAT, MMM, face mask in place  Cardiovascular:  RRR, nl S1, S2 no mrg, 2+ pulses, warm extremities  Respiratory:  Bilateral wheezes and rhonchi present.  Rales at bilateral bases, no increased WOB  Abdomen:   NABS, soft, NT/ND, PEG tube in place  MSK:   Normal tone and bulk, no LEE  Neuro:  Left leg 3/5, right hemiplegia  Data Reviewed: Basic Metabolic Panel:  Recent Labs Lab 06/05/13 0300 06/05/13 1033 06/06/13 0513  NA 139  --  139  K 4.4  --  3.4*  CL 97  --  101  CO2 30  --  29  GLUCOSE 215*  --  283*  BUN 22  --  15  CREATININE 0.50 0.47* 0.44*  CALCIUM 10.8*  --  9.8   Liver Function Tests:  Recent Labs Lab 06/05/13 0300  AST 15  ALT 14  ALKPHOS 112  BILITOT 0.3  PROT 8.2  ALBUMIN 3.6   No results found for this basename: LIPASE, AMYLASE,  in the last 168 hours No results found for this basename: AMMONIA,  in the last 168 hours CBC:  Recent Labs Lab 06/05/13 0300 06/05/13 1033 06/06/13 0513  WBC 13.0* 12.2* 12.5*  HGB 10.9* 9.9* 8.3*  HCT 33.9* 30.9* 25.6*  MCV 92.1 92.0 91.8  PLT 371 343 301   Cardiac Enzymes:  Recent Labs Lab 06/05/13 0306 06/05/13 1033 06/05/13 1540  TROPONINI <0.30 <0.30 <0.30   BNP  (last 3 results)  Recent Labs  06/26/12 2320 06/05/13 0306  PROBNP 150.2* 94.8   CBG:  Recent Labs Lab 06/05/13 1353 06/05/13 1629 06/05/13 2119 06/06/13 06/06/13 0501  GLUCAP 286* 220* 172* 197* 244*    Recent Results (from the past 240 hour(s))  MRSA PCR SCREENING     Status: None   Collection Time    06/05/13  7:46 AM      Result Value Range Status   MRSA by PCR NEGATIVE  NEGATIVE Final   Comment:            The GeneXpert MRSA Assay (FDA     approved for NASAL specimens     only), is one component of a     comprehensive MRSA colonization     surveillance program. It is not     intended to diagnose MRSA     infection nor to guide or     monitor treatment for     MRSA infections.     Studies: Ct Angio Chest Pe W/cm &/or Wo Cm  06/05/2013   CLINICAL DATA:  Shortness of breath. Elevated D-dimer. Acute respiratory failure. COPD an ischemic cardiomyopathy.  EXAM: CT ANGIOGRAPHY CHEST WITH CONTRAST  TECHNIQUE: Multidetector CT imaging of the chest was performed using the standard protocol during bolus administration of intravenous contrast. Multiplanar CT image reconstructions including MIPs were obtained to evaluate the vascular anatomy.  CONTRAST:  OMNIPAQUE IOHEXOL 350 MG/ML SOLN  COMPARISON:  04/13/2013  FINDINGS: Satisfactory opacification of pulmonary arteries noted, and no pulmonary emboli identified. No evidence of thoracic aortic dissection or aneurysm. No evidence of mediastinal hematoma or mass. Shotty mediastinal and hilar lymph nodes remain stable, with largest precarinal lymph node measuring 8 mm. No pathologically enlarged lymph nodes identified.  No evidence of pleural or pericardial effusion. Mild pulmonary emphysema again noted. New airspace disease is seen in the peripheral right lower lobe, suspicious for pneumonia. No evidence of central endobronchial obstruction. A 3 mm subpleural nodule in the right middle lobe on image 56 remains stable. No other  suspicious pulmonary nodules or masses are identified.  Review of the MIP images confirms the above findings.  IMPRESSION: No evidence of pulmonary embolism.  New right lower lobe airspace disease, suspicious for pneumonia. Recommend Sabrie Moritz-term radiographic followup in several weeks to confirm resolution.  Mild emphysema.   Electronically Signed   By: Myles Rosenthal M.D.   On: 06/05/2013 23:40   Dg Chest Portable 1 View  06/05/2013   CLINICAL DATA:  Respiratory distress,  history of stroke and diabetes, hypertension.  EXAM: PORTABLE CHEST - 1 VIEW  COMPARISON:  Chest radiograph June 03, 2013  FINDINGS: Cardiomediastinal silhouette is nonsuspicious, mildly calcified aortic knob. Similar mild chronic interstitial changes and pulmonary hyperexpansion without superimposed pleural effusions or focal consolidations. No pneumothorax. Osteopenia. Multiple EKG lines overlie the patient and may obscure subtle underlying pathology. Soft tissue planes are nonsuspicious.  IMPRESSION: Emphysematous changes without superimposed acute cardiopulmonary process.   Electronically Signed   By: Awilda Metro   On: 06/05/2013 03:35    Scheduled Meds: . ipratropium  0.5 mg Nebulization Q4H   And  . albuterol  2.5 mg Nebulization Q4H  . aspirin  81 mg Per Tube Daily  . atorvastatin  20 mg Per Tube QHS  . budesonide (PULMICORT) nebulizer solution  0.5 mg Nebulization BID  . chlorhexidine  15 mL Mouth/Throat BID  . clopidogrel  75 mg Per Tube Q breakfast  . enoxaparin (LOVENOX) injection  40 mg Subcutaneous Q24H  . feeding supplement (GLUCERNA 1.2 CAL)  1,000 mL Per Tube Q24H  . feeding supplement (PRO-STAT SUGAR FREE 64)  30 mL Per Tube Daily  . free water  250 mL Per Tube Q6H  . insulin aspart  0-9 Units Subcutaneous Q6H  . insulin glargine  10 Units Subcutaneous QHS  . methylPREDNISolone (SOLU-MEDROL) injection  60 mg Intravenous Q6H  . multivitamin  5 mL Per Tube Daily  . pantoprazole sodium  40 mg Per Tube  Daily  . piperacillin-tazobactam (ZOSYN)  IV  3.375 g Intravenous Q8H  . sodium chloride  3 mL Intravenous Q12H  . vancomycin  500 mg Intravenous Q12H   Continuous Infusions:   Principal Problem:   COPD with acute exacerbation Active Problems:   Acute respiratory failure   Diabetes mellitus without complication   NICM (nonischemic cardiomyopathy)   Physical deconditioning   CVA (cerebral infarction)   Dysphagia   Protein-calorie malnutrition, severe   Leukocytosis, unspecified   Normocytic anemia    Time spent: 30 min    Lennette Fader  [facility] Pager [phone]. If 7PM-7AM, please contact night-coverage at [email or web address], password [facility] 06/06/2013, 8:29 AM  LOS: 1 day

## 2013-06-06 NOTE — Progress Notes (Signed)
INITIAL NUTRITION ASSESSMENT  DOCUMENTATION CODES Per approved criteria  -Severe malnutrition in the context of chronic illness   INTERVENTION: Glucerna 1.2 @ 40 ml/hr recommend continue to advance to goal rate as tolerated. Free water to 250 q 6 hr provides additional 1000 ml water daily.  Add MVI daily via PEG RD will follow nutrition care  NUTRITION DIAGNOSIS: Inadequate oral intake ongoing related to previous stroke as evidenced by NPO status for safety.   Goal: Pt to meet >/= 90% of their estimated nutrition needs   Monitor:  Nutrition support measures, tolerance, labs and changes in status   Reason for Assessment: Pt admitted with PEG tube feeding  72 y.o. female  Admitting Dx: COPD with acute exacerbation  ASSESSMENT: Tolerating EN @ 40 ml/hr which is providing 1152 kcal, 58 gr protein and 773 ml water. ProStat 30 ml add (100 kcal, 15 gr protein). Wt gain noted. She is on daily wts and strict I/O's. Insulin adjusted both long-acting and SSI.  Hx: Pt is from [facility] She has hx of CVA and is s/p PEG placement (20 fr. LUQ) 05/05/13.  EN has been resumed at 20 ml/hr with ProStat 30 ml daily and MVI. Her wt has increased 2.5 #, 2.2% x 30 days. Desired given her hx of unplanned wt loss and malnutrition.   Patient Active Problem List   Diagnosis Date Noted  . COPD with acute exacerbation 06/05/2013  . Normocytic anemia   . Leukocytosis, unspecified 05/18/2013  . Fever, unspecified 05/16/2013  . Protein-calorie malnutrition, severe 05/01/2013  . Acute encephalopathy 04/30/2013  . Acute renal failure 04/30/2013  . Hypernatremia 04/30/2013  . Generalized weakness 04/30/2013  . Adult failure to thrive 04/30/2013  . possible Aspiration pneumonia 04/13/2013  . 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 Acute ischemic stroke 03/22/2013  . Dysphagia 03/22/2013  . UTI (urinary tract infection) 03/22/2013  . COPD (chronic  obstructive pulmonary disease) 07/04/2012  . Physical deconditioning 07/04/2012  . CVA (cerebral infarction) 07/04/2012  . Asthma   . Tobacco abuse 07/01/2012  . Acute respiratory failure 06/28/2012  . NICM (nonischemic cardiomyopathy) 06/28/2012  . Hypotension 06/28/2012  . Non-ST elevation myocardial infarction (NSTEMI), initial care episode - Type 2 secondary to respiratory issues 06/28/2012  . Diabetes mellitus without complication   . Acute on chronic systolic CHF (congestive heart failure) 06/27/2012    Height: Ht Readings from Last 1 Encounters:  06/05/13 5\' 7"  (1.702 m)    Weight: Wt Readings from Last 1 Encounters:  06/06/13 127 lb 10.3 oz (57.9 kg)    Ideal Body Weight: 135# (61.3 kg)  % Ideal Body Weight: 90%  Wt Readings from Last 10 Encounters:  06/06/13 127 lb 10.3 oz (57.9 kg)  05/09/13 119 lb 7.8 oz (54.2 kg)  05/09/13 119 lb 7.8 oz (54.2 kg)  04/12/13 123 lb 10.9 oz (56.1 kg)  04/10/13 133 lb (60.328 kg)  04/10/13 133 lb (60.328 kg)  03/29/13 128 lb (58.06 kg)  03/25/13 128 lb 14.4 oz (58.469 kg)  07/04/12 122 lb 12.7 oz (55.7 kg)  07/04/12 129 lb 8 oz (58.741 kg)    Usual Body Weight: 130-133#  % Usual Body Weight: 93%  BMI:  Body mass index is 19.99 kg/(m^2).normal range  Estimated Nutritional Needs: Kcal: 1500-1800 Protein: 70-80 gr Fluid: >1600 ml daily  Skin: skin tear to elbow  Diet Order: NPO  EDUCATION NEEDS: -No education needs identified at this time   Intake/Output Summary (Last  24 hours) at 06/06/13 1631 Last data filed at 06/06/13 1200  Gross per 24 hour  Intake 2397.5 ml  Output   1400 ml  Net  997.5 ml    Last BM: 06/05/13  Labs:   Recent Labs Lab 06/05/13 0300 06/05/13 1033 06/06/13 0513  NA 139  --  139  K 4.4  --  3.4*  CL 97  --  101  CO2 30  --  29  BUN 22  --  15  CREATININE 0.50 0.47* 0.44*  CALCIUM 10.8*  --  9.8  GLUCOSE 215*  --  283*    CBG (last 3)   Recent Labs  06/06/13 0501  06/06/13 0931 06/06/13 1139  GLUCAP 244* 233* 265*    Scheduled Meds: . ipratropium  0.5 mg Nebulization Q4H   And  . albuterol  2.5 mg Nebulization Q4H  . aspirin  81 mg Per Tube Daily  . atorvastatin  20 mg Per Tube QHS  . budesonide (PULMICORT) nebulizer solution  0.5 mg Nebulization BID  . chlorhexidine  15 mL Mouth/Throat BID  . clopidogrel  75 mg Per Tube Q breakfast  . enoxaparin (LOVENOX) injection  40 mg Subcutaneous Q24H  . feeding supplement (GLUCERNA 1.2 CAL)  1,000 mL Per Tube Q24H  . feeding supplement (PRO-STAT SUGAR FREE 64)  30 mL Per Tube Daily  . free water  250 mL Per Tube Q6H  . insulin aspart  0-15 Units Subcutaneous Q6H  . insulin glargine  15 Units Subcutaneous QHS  . methylPREDNISolone (SOLU-MEDROL) injection  60 mg Intravenous BID  . multivitamin  5 mL Per Tube Daily  . pantoprazole sodium  40 mg Per Tube Daily  . piperacillin-tazobactam (ZOSYN)  IV  3.375 g Intravenous Q8H  . sodium chloride  3 mL Intravenous Q12H  . vancomycin  500 mg Intravenous Q12H    Continuous Infusions:    Past Medical History  Diagnosis Date  . Stroke 2011    with right hemiparesis and does not use her left leg  . Diabetes mellitus without complication   . Asthma   . HTN (hypertension)   . Tobacco abuse   . 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 COPD (chronic obstructive pulmonary disease)   . Dysphagia     s/p PEG tube  . Ischemic cardiomyopathy     EF 25% cath 04/2013, grade 1 DD  . Normocytic anemia     Past Surgical History  Procedure Laterality Date  . Appendectomy    . Peg placement N/A 05/05/2013    Procedure: PERCUTANEOUS ENDOSCOPIC GASTROSTOMY (PEG) PLACEMENT;  Surgeon: Malissa Hippo, MD;  Location: [facility];  Service: Endoscopy;  Laterality: N/A;  . Esophagogastroduodenoscopy N/A 05/05/2013    Procedure: ESOPHAGOGASTRODUODENOSCOPY (EGD);  Surgeon: Malissa Hippo, MD;  Location: [facility];  Service: Endoscopy;  Laterality: N/A;    Royann Shivers MS,RD,CSG,LDN Office: [phone] Pager: [phone]

## 2013-06-07 DIAGNOSIS — G9340 Encephalopathy, unspecified: Secondary | ICD-10-CM

## 2013-06-07 DIAGNOSIS — I5023 Acute on chronic systolic (congestive) heart failure: Secondary | ICD-10-CM

## 2013-06-07 DIAGNOSIS — I509 Heart failure, unspecified: Secondary | ICD-10-CM

## 2013-06-07 LAB — BLOOD GAS, ARTERIAL
Acid-Base Excess: 4.8 mmol/L — ABNORMAL HIGH (ref 0.0–2.0)
Bicarbonate: 27.7 mEq/L — ABNORMAL HIGH (ref 20.0–24.0)
Drawn by: 317771
O2 Content: 3 L/min
O2 Saturation: 92.3 %
Patient temperature: 37
pCO2 arterial: 33.2 mmHg — ABNORMAL LOW (ref 35.0–45.0)
pH, Arterial: 7.53 — ABNORMAL HIGH (ref 7.350–7.450)
pO2, Arterial: 60 mmHg — ABNORMAL LOW (ref 80.0–100.0)

## 2013-06-07 LAB — STREP PNEUMONIAE URINARY ANTIGEN: Strep Pneumo Urinary Antigen: NEGATIVE

## 2013-06-07 LAB — D-DIMER, QUANTITATIVE (NOT AT ARMC): D-Dimer, Quant: 0.83 ug/mL-FEU — ABNORMAL HIGH (ref 0.00–0.48)

## 2013-06-07 LAB — LEGIONELLA ANTIGEN, URINE

## 2013-06-07 LAB — GLUCOSE, CAPILLARY
Glucose-Capillary: 186 mg/dL — ABNORMAL HIGH (ref 70–99)
Glucose-Capillary: 137 mg/dL — ABNORMAL HIGH (ref 70–99)
Glucose-Capillary: 191 mg/dL — ABNORMAL HIGH (ref 70–99)
Glucose-Capillary: 210 mg/dL — ABNORMAL HIGH (ref 70–99)

## 2013-06-07 LAB — FOLATE RBC: RBC Folate: 1452 ng/mL — ABNORMAL HIGH (ref >280)

## 2013-06-07 MED ORDER — LEVOFLOXACIN 25 MG/ML PO SOLN: 500.0000 mg | Freq: Every day | ORAL | Status: DC

## 2013-06-07 MED ORDER — LEVOFLOXACIN 500 MG PO TABS: 500.0000 mg | ORAL_TABLET | Freq: Every day | ORAL | Status: DC

## 2013-06-07 MED ORDER — METHYLPREDNISOLONE SODIUM SUCC 125 MG IJ SOLR
60.0000 mg | Freq: Every day | INTRAMUSCULAR | Status: DC
  Administered 2013-06-07 – 2013-06-08 (×2): 60 mg via INTRAVENOUS
  Filled 2013-06-07: qty 2

## 2013-06-07 MED ORDER — PREDNISONE 5 MG PO TABS: 5.0000 mg | ORAL_TABLET | Freq: Every day | ORAL | Status: DC

## 2013-06-07 MED ORDER — LEVOFLOXACIN 500 MG PO TABS
500.0000 mg | ORAL_TABLET | Freq: Every day | ORAL | Status: DC
  Administered 2013-06-07 – 2013-06-08 (×2): 500 mg
  Filled 2013-06-07 (×2): qty 1

## 2013-06-07 NOTE — Clinical Social Work Note (Signed)
[facility] admissions notified of patient discharge tomorrow - asked that MD provide medication list today in order to facilitate transfer on Thursday.  FL2 in shadow chart.  Santa Genera, LCSW Clinical Social Worker [phone])

## 2013-06-07 NOTE — Clinical Social Work Note (Signed)
Med list faxed to [facility] in anticipation of d/c tomorrow.  Derenda Fennel, [place] [phone]

## 2013-06-07 NOTE — Progress Notes (Signed)
At 1930 pt was agitated with sats in the low 90's and with a HR at and above 130's. Mid level NP was made aware. ABGs were ordered. NP stated to consult the [facility] hospitalist. Dr. Orvan Falconer was notified of pts condition at 2200. A d.dimer was ordered which resulted slightly elevated. No new orders at this time. Pt is presenting much better with a HR around low 100's, O2 saturation at 93-94% and less agitated. Will continue to monitor closely.

## 2013-06-07 NOTE — Progress Notes (Signed)
[facility] PROGRESS NOTE  Christine Durham ZOX:[id number] DOB: 07-26-1940 DOA: 06/05/2013 PCP: Ignatius Specking., MD  Assessment/Plan: Acute hypoxic respiratory failure likely secondary to acute COPD exacerbation and HCAP vs. Aspiration PNA. CTa chest neg for PE  - Weaned Solumedrol 60mg  IV to q12h -> wean to daily - Budesonide 0.5mg  BID  - Duoneb q4h  - Abx as below  - Wean oxygen as tolerated, currently 3L [place] - Continue PPI therapy  Aspiration pneumonia vs. HCAP (hospitalization one month ago)  - Was on vancomycin and Zosyn, consider transitioning to PO abx - Urine strep pneumo and Legionella antigen were cancelled yesterday. 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 Labile blood pressures  - Continue aspirin, statin, Plavix  - Held Norvasc secondary to relative hypotension - Judicious use of IV fluids  - Telemetry: Sinus tach  - Troponins neg  - Daily weights and strict I/O  Type 2 diabetes, A1c 8 03/2013, hyperglycemia due to steroids  - Weaning steroids  - Increase Lantus to 15 units  - Increase to mod dose SSI q6h  CVA with right hemiparesis and left weakness, aphagia, dysphagia. At risk for decub ulcers  - Use foam dressing, air mattress  ICA disease, stable. Dr. Durwin Nora is vascular surgeon.  Dysphagia with tube feeds  - Continue tube feeds with free water  - Appreciate nutrition assistance  Leukocytosis, stable.  - Continue antibiotics  Normocytic anemia, hgb trending down  - Monitor for bleeding  - CBC  - Iron studies, B12 unremarkable - No TSH due to acute illness and steroids  Bladder probably colonized with yeast.   Code Status: Full Family Communication: Pt in room (indicate person spoken with, relationship, and if by phone, the number) Disposition Plan: Pending  Antibiotics: Vancomycin 11/24 >>  Zosyn 11/24 >>    HPI/Subjective: No acute events overnight  Objective: Filed Vitals:   06/07/13 0227 06/07/13 0611 06/07/13 0709 06/07/13 0711  BP: 136/56 100/82    Pulse: 86 92    Temp: 99.4 F (37.4 C) 98.9 F (37.2 C)    TempSrc: Axillary Axillary    Resp: 26 24    Height:      Weight: 56.2 kg (123 lb 14.4 oz) 56.1 kg (123 lb 10.9 oz)    SpO2: 96% 96% 97% 97%    Intake/Output Summary (Last 24 hours) at 06/07/13 1042 Last data filed at 06/07/13 1018  Gross per 24 hour  Intake   1220 ml  Output   3400 ml  Net  -2180 ml   Filed Weights   06/06/13 1400 06/07/13 0227 06/07/13 0611  Weight: 57.9 kg (127 lb 10.3 oz) 56.2 kg (123 lb 14.4 oz) 56.1 kg (123 lb 10.9 oz)    Exam:   General:  Awake, in nad  Cardiovascular: regular, s1, s2  Respiratory: normal resp effort, no wheezing  Abdomen: soft, nondistended  Musculoskeletal: perfused, no clubbing   Data Reviewed: Basic Metabolic Panel:  Recent Labs Lab 06/05/13 0300 06/05/13 1033 06/06/13 0513  NA 139  --  139  K 4.4  --  3.4*  CL 97  --  101  CO2 30  --  29  GLUCOSE 215*  --  283*  BUN 22  --  15  CREATININE 0.50 0.47* 0.44*  CALCIUM 10.8*  --  9.8   Liver Function Tests:  Recent Labs Lab 06/05/13 0300  AST 15  ALT 14  ALKPHOS 112  BILITOT 0.3  PROT 8.2  ALBUMIN 3.6   No results found for this basename: LIPASE, AMYLASE,  in the last 168 hours No results found for this basename: AMMONIA,  in the last 168 hours CBC:  Recent Labs Lab 06/05/13 0300 06/05/13 1033 06/06/13 0513 06/06/13 1329  WBC 13.0* 12.2* 12.5* 14.9*  HGB 10.9* 9.9* 8.3* 8.6*  HCT 33.9* 30.9* 25.6* 26.7*  MCV 92.1 92.0 91.8 91.1  PLT 371 343 301 324   Cardiac Enzymes:  Recent Labs Lab 06/05/13 0306 06/05/13 1033 06/05/13 1540  TROPONINI <0.30 <0.30 <0.30   BNP (last 3 results)  Recent Labs  06/26/12 2320 06/05/13 0306  PROBNP 150.2* 94.8   CBG:  Recent Labs Lab 06/06/13 1139 06/06/13 1636 06/06/13 2018  06/06/13 2334 06/07/13 0608  GLUCAP 265* 73 149* 202* 186*    Recent Results (from the past 240 hour(s))  URINE CULTURE     Status: None   Collection Time    06/05/13  3:34 AM      Result Value Range Status   Specimen Description URINE, CATHETERIZED   Final   Special Requests NONE   Final   Culture  Setup Time     Final   Value: 06/05/2013 15:01     Performed at [facility] Count     Final   Value: >=100,000 COLONIES/ML     Performed at [facility]   Culture     Final   Value: YEAST     Performed at [facility]   Report Status 06/06/2013 FINAL   Final  MRSA PCR SCREENING     Status: None   Collection Time    06/05/13  7:46 AM      Result Value Range Status   MRSA by PCR NEGATIVE  NEGATIVE Final   Comment:            The GeneXpert MRSA Assay (FDA     approved for NASAL specimens     only), is one component of a     comprehensive MRSA colonization     surveillance program. It is not     intended to diagnose MRSA     infection nor to guide or     monitor treatment for     MRSA infections.     Studies: Ct Angio Chest Pe W/cm &/or Wo Cm  06/05/2013   CLINICAL DATA:  Shortness of breath. Elevated D-dimer. Acute respiratory failure. COPD an ischemic cardiomyopathy.  EXAM: CT ANGIOGRAPHY CHEST WITH CONTRAST  TECHNIQUE: Multidetector CT imaging of the chest was performed using the standard protocol during bolus administration of intravenous contrast. Multiplanar CT image reconstructions including MIPs were obtained to evaluate the vascular anatomy.  CONTRAST:  OMNIPAQUE IOHEXOL 350 MG/ML SOLN  COMPARISON:  04/13/2013  FINDINGS: Satisfactory opacification of pulmonary arteries noted, and no pulmonary emboli identified. No evidence of thoracic aortic dissection or aneurysm. No evidence of mediastinal hematoma or mass. Shotty mediastinal and hilar lymph nodes remain stable, with largest precarinal lymph node measuring 8 mm. No pathologically  enlarged lymph nodes identified.  No evidence of pleural or pericardial effusion. Mild pulmonary emphysema again noted. New airspace disease is seen in the peripheral right lower lobe, suspicious for pneumonia. No evidence of central endobronchial obstruction. A 3 mm subpleural nodule in the right middle lobe on image 56 remains stable. No other suspicious pulmonary nodules or masses are identified.  Review of the MIP images confirms the above findings.  IMPRESSION: No evidence of pulmonary embolism.  New right lower lobe airspace disease, suspicious for pneumonia. Recommend short-term radiographic followup in several weeks to confirm resolution.  Mild emphysema.   Electronically Signed   By: Myles Rosenthal M.D.   On: 06/05/2013 23:40    Scheduled Meds: . ipratropium  0.5 mg Nebulization Q4H   And  . albuterol  2.5 mg Nebulization Q4H  . aspirin  81 mg Per Tube Daily  . atorvastatin  20 mg Per Tube QHS  . budesonide (PULMICORT) nebulizer solution  0.5 mg Nebulization BID  . chlorhexidine  15 mL Mouth/Throat BID  . clopidogrel  75 mg Per Tube Q breakfast  . enoxaparin (LOVENOX) injection  40 mg Subcutaneous Q24H  . feeding supplement (GLUCERNA 1.2 CAL)  1,000 mL Per Tube Q24H  . feeding supplement (PRO-STAT SUGAR FREE 64)  30 mL Per Tube Daily  . free water  250 mL Per Tube Q6H  . insulin aspart  0-15 Units Subcutaneous Q6H  . insulin glargine  15 Units Subcutaneous QHS  . methylPREDNISolone (SOLU-MEDROL) injection  60 mg Intravenous BID  . multivitamin  5 mL Per Tube Daily  . pantoprazole sodium  40 mg Per Tube Daily  . piperacillin-tazobactam (ZOSYN)  IV  3.375 g Intravenous Q8H  . sodium chloride  3 mL Intravenous Q12H  . vancomycin  500 mg Intravenous Q12H   Continuous Infusions:   Principal Problem:   COPD with acute exacerbation Active Problems:   Acute respiratory failure   Diabetes mellitus without complication   NICM (nonischemic cardiomyopathy)   Physical deconditioning   CVA  (cerebral infarction)   Dysphagia   Protein-calorie malnutrition, severe   Leukocytosis, unspecified   Normocytic anemia  Time spent:  Phong Isenberg K  [facility] Pager [phone]. If 7PM-7AM, please contact night-coverage at [email or web address], password [facility] 06/07/2013, 10:42 AM  LOS: 2 days

## 2013-06-08 DIAGNOSIS — R627 Adult failure to thrive: Secondary | ICD-10-CM

## 2013-06-08 DIAGNOSIS — J45909 Unspecified asthma, uncomplicated: Secondary | ICD-10-CM

## 2013-06-08 LAB — BASIC METABOLIC PANEL
BUN: 22 mg/dL (ref 6–23)
Calcium: 10.3 mg/dL (ref 8.4–10.5)
Chloride: 99 mEq/L (ref 96–112)
Creatinine, Ser: 0.53 mg/dL (ref 0.50–1.10)
GFR calc Af Amer: >90 mL/min (ref >90)
GFR calc non Af Amer: >90 mL/min (ref >90)
Glucose, Bld: 144 mg/dL — ABNORMAL HIGH (ref 70–99)
Potassium: 2.9 mEq/L — ABNORMAL LOW (ref 3.5–5.1)

## 2013-06-08 LAB — CBC WITH DIFFERENTIAL/PLATELET
Basophils Relative: 0 % (ref 0–1)
Eosinophils Absolute: 0.1 10*3/uL (ref 0.0–0.7)
Eosinophils Relative: 0 % (ref 0–5)
HCT: 31.6 % — ABNORMAL LOW (ref 36.0–46.0)
Hemoglobin: 10.1 g/dL — ABNORMAL LOW (ref 12.0–15.0)
Lymphs Abs: 3.7 10*3/uL (ref 0.7–4.0)
MCH: 28.9 pg (ref 26.0–34.0)
MCHC: 32 g/dL (ref 30.0–36.0)
MCV: 90.3 fL (ref 78.0–100.0)
Monocytes Absolute: 1.4 10*3/uL — ABNORMAL HIGH (ref 0.1–1.0)
Monocytes Relative: 9 % (ref 3–12)
RBC: 3.5 MIL/uL — ABNORMAL LOW (ref 3.87–5.11)
RDW: 14 % (ref 11.5–15.5)

## 2013-06-08 LAB — GLUCOSE, CAPILLARY: Glucose-Capillary: 128 mg/dL — ABNORMAL HIGH (ref 70–99)

## 2013-06-08 MED ORDER — POTASSIUM CHLORIDE 20 MEQ PO PACK: 40.0000 meq | PACK | Freq: Every day | ORAL | Status: AC

## 2013-06-08 MED ORDER — POTASSIUM CHLORIDE 20 MEQ PO PACK
40.0000 meq | PACK | Freq: Every day | ORAL | Status: DC
  Filled 2013-06-08: qty 2

## 2013-06-08 MED ORDER — POTASSIUM CHLORIDE 10 MEQ/100ML IV SOLN
10.0000 meq | INTRAVENOUS | Status: AC
  Administered 2013-06-08 (×4): 10 meq via INTRAVENOUS
  Filled 2013-06-08: qty 100

## 2013-06-08 NOTE — Progress Notes (Signed)
Patient DC'd back to [facility].  Report called to Dela at receiving facility.  IVs removed - WNL.  PEG flushed and disconnected from feeding for transport.  Family aware of transfer.  Patient dressed in transfer gown.  Stable at DC.  Transported via bed by NTs

## 2013-06-08 NOTE — Progress Notes (Signed)
Pt was changed from a nasal cannula to a venti mask due to the pt intermittently dropping below 90% saturation. Pt is at 95% venti mask and tolerating well. Will continue to monitor pt closely.

## 2013-06-08 NOTE — Progress Notes (Signed)
Switched pt back to nasal cannula from venti mask due to pt removing mask. Due to pt being a mouth breather, will monitor O2 saturation closely. Pt sustaining saturation in mid to upper 90s. Pt safe and stable. Will continue to monitor.

## 2013-06-08 NOTE — Discharge Summary (Addendum)
Physician Discharge Summary  Christine Durham ZOX:[id number] DOB: 03-18-1941 DOA: 06/05/2013  PCP: Ignatius Specking., MD  Admit date: 06/05/2013 Discharge date: 06/08/2013  Time spent: 35 minutes  Recommendations for Outpatient Follow-up:  Follow up with PCP in 1-2 weeks Please check renal panel within one week  Discharge Diagnoses:  Principal Problem:   COPD with acute exacerbation Active Problems:   Acute respiratory failure   Diabetes mellitus without complication   NICM (nonischemic cardiomyopathy)   Physical deconditioning   CVA (cerebral infarction)   Dysphagia   Protein-calorie malnutrition, severe   Leukocytosis, unspecified   Normocytic anemia  Discharge Condition: Improved  Diet recommendation: Glucerna at 50cc/hr  Filed Weights   06/07/13 0227 06/07/13 0611 06/08/13 0603  Weight: 56.2 kg (123 lb 14.4 oz) 56.1 kg (123 lb 10.9 oz) 54 kg (119 lb 0.8 oz)    History of present illness:  The patient is a 72 y.o. 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 She also has type 2 diabetes, COPD, probable aspiration pneumonia, history of NSTEMI 2, chronic grade 1 diastolic dysfunction and systolic heart failure with EF of 25% per 04/2013 catheterization, severe dysphasia with PEG tube placement approximately one month ago on tube feeds who presents with progressive shortness of breath from [facility] skilled nursing facility. The patient was last at their baseline health several days ago. Her niece provides the history. Several days ago she was at her baseline mentation, able to communicate with meaningful glances, alert, making good eye contact, in no respiratory distress. Yesterday, she seemed to be more progress, and she was given several breathing treatments without much improvement. Overnight she became  increasingly short of breath, had wheezes, and a low-grade temperature of around 100F. She constantly has gurgling noises in the back of her throat, however she has not had x-ray evidence of aspiration pneumonia. She was transported by EMS to the emergency department on BiPAP, and received the nebulizer treatments through BiPAP which improved her breathing.  In the emergency department, her temperature was within normal limits, pulse 1:15, respirations 23, blood pressure 151/86, oxygen saturation to 100% on BiPAP. Her initial blood gas on BiPAP 15/5 with 75% FiO2 was pH 7.38, PCO2 50, PaO2 359. Her chest x-ray demonstrated stable scarring, but no obvious infiltrate or pulmonary edema or vascular congestion. She had emphysematous changes. Her proBNP and troponin were negative. Her white blood cell count was elevated at 13, mild anemia with hemoglobin 10.9, BNP within normal limits except for mildly elevated calcium of 10.8, glucose 215. She was given an additional albuterol nebulizer treatments, Solu-Medrol, gentamicin and Zosyn for presumptive aspiration and healthcare associated pneumonia.  Hospital Course:  Acute hypoxic respiratory failure likely secondary to acute COPD exacerbation and HCAP vs. Aspiration PNA. CTa chest neg for PE  - Weaned Solumedrol 60mg  IV to q12h -> wean to daily with a prednisone taper daily on d/c - Was continued on Budesonide 0.5mg  BID and Duonebs q4h  - Was continued on abx as below  - Wean oxygen as tolerated, currently weaned to minimal o2 support  - Continue PPI therapy  Aspiration pneumonia vs. HCAP (hospitalization one month ago)  - Was on vancomycin and Zosyn, transitioned to PO abx   CAD with chronic systolic and diastolic heart failure with ejection fraction of 25% and grade 1 diastolic dysfunction. Labile blood pressures  - Continue on  aspirin, statin, Plavix  - Held Norvasc secondary to relative hypotension with plans to resume on d/c - Judiciously given of IV  fluids  - Telemetry: Sinus tach noted intermittently during hospitalization, suspected secondary to agitation - Troponins were neg  Type 2 diabetes, A1c 8 03/2013, hyperglycemia due to steroids  - Weaning steroids per above - Increased Lantus to 15 units  - Increase to mod dose SSI q6h  CVA with right hemiparesis and left weakness, aphagia, dysphagia. At risk for decub ulcers  - Use foam dressing, air mattress  ICA disease, stable. Dr. Durwin Nora is vascular surgeon.  Dysphagia with tube feeds  - Continue tube feeds with free water  - Appreciate nutrition assistance  Leukocytosis, stable.  - Continue antibiotics  Normocytic anemia, hgb trending down  - Monitor for bleeding  - CBC  - Iron studies, B12 unremarkable  - No TSH due to acute illness and steroids  Bladder probably colonized with yeast.  Hypokalemia - Required IV replacement - Pt will be discharged with daily replacement  Discharge Exam: Filed Vitals:   06/08/13 0306 06/08/13 0603 06/08/13 0712 06/08/13 1117  BP:  134/80    Pulse:  93    Temp:  97.5 F (36.4 C)    TempSrc:  Axillary    Resp:  22    Height:      Weight:  54 kg (119 lb 0.8 oz)    SpO2: 95% 98% 97% 98%    General: Awake, in nad Cardiovascular: regular, s1, s2 Respiratory: normal resp effort, no wheezing  Discharge Instructions       Future Appointments Provider Department Dept Phone   10/04/2013 10:30 AM [facility] [address] [phone]   10/04/2013 11:30 AM Chuck Hint, MD [facility] [phone]       Medication List         acetaminophen 160 MG/5ML liquid  Commonly known as:  TYLENOL  Place 500 mg into feeding tube every 4 (four) hours as needed for fever or pain.     albuterol (2.5 MG/3ML) 0.083% nebulizer solution  Commonly known as:  PROVENTIL  Take 2.5 mg by nebulization every 4 (four) hours as needed for wheezing or shortness of breath.     amLODipine 5 MG  tablet  Commonly known as:  NORVASC  Place 10 mg into feeding tube daily.     aspirin 81 MG chewable tablet  Chew 81 mg by mouth daily.     atorvastatin 10 MG tablet  Commonly known as:  LIPITOR  Take 20 mg by mouth daily.     chlorhexidine 0.12 % solution  Commonly known as:  PERIDEX  Use as directed 15 mLs in the mouth or throat 2 (two) times daily. Please swab in mouth and suction residual.     clopidogrel 75 MG tablet  Commonly known as:  PLAVIX  Place 75 mg into feeding tube daily with breakfast.     feeding supplement (GLUCERNA 1.2 CAL) Liqd  Place 1,000 mLs into feeding tube daily. At 50 ml/h     feeding supplement (PRO-STAT SUGAR FREE 64) Liqd  Place 30 mLs into feeding tube daily.     free water Soln  Place 250 mLs into feeding tube every 6 (six) hours.     insulin glargine 100 UNIT/ML injection  Commonly known as:  LANTUS  Inject 10 Units into the skin at bedtime.     levofloxacin 25 MG/ML solution  Commonly known  as:  LEVAQUIN  Take 20 mLs (500 mg total) by mouth daily.     metFORMIN 500 MG tablet  Commonly known as:  GLUCOPHAGE  Place 500 mg into feeding tube 2 (two) times daily with a meal.     multivitamin Liqd  Place 5 mLs into feeding tube daily.     pantoprazole sodium 40 mg/20 mL Pack  Commonly known as:  PROTONIX  Place 20 mLs (40 mg total) into feeding tube daily.     potassium chloride 20 MEQ packet  Commonly known as:  KLOR-CON  Place 40 mEq into feeding tube daily.  Start taking on:  06/09/2013     predniSONE 5 MG tablet  Commonly known as:  DELTASONE  Take 1 tablet (5 mg total) by mouth daily with breakfast.       No Known Allergies    The results of significant diagnostics from this hospitalization (including imaging, microbiology, ancillary and laboratory) are listed below for reference.    Significant Diagnostic Studies: Dg Chest 1 View  06/03/2013   CLINICAL DATA:  Increase congestion.  EXAM: CHEST - 1 VIEW  COMPARISON:   05/16/2013  FINDINGS: Minimal linear densities in the lung bases, likely scarring, stable. Heart is normal size. No acute airspace opacities or effusions. No acute bony abnormality.  IMPRESSION: No active disease.   Electronically Signed   By: Charlett Nose M.D.   On: 06/03/2013 20:39   Dg Chest 1 View  05/16/2013   CLINICAL DATA:  Chest congestion.  EXAM: CHEST - 1 VIEW  COMPARISON:  05/06/2013.  FINDINGS: Mediastinum and hilar structures are normal. Heart size normal. Previously identified interstitial prominence on prior study of 05/06/2013 has partially cleared. Mild atelectasis lung bases. Degenerative changes thoracic spine and both shoulders.  IMPRESSION: 1. Interim clearing of interstitial prominence noted on prior chest x-ray of 05/06/2013. 2. Mild atelectasis left lung base. Followup chest x-ray to exclude developing basilar pneumonia should be considered.   Electronically Signed   By: Maisie Fus  Register   On: 05/16/2013 16:58   Ct Angio Chest Pe W/cm &/or Wo Cm  06/05/2013   CLINICAL DATA:  Shortness of breath. Elevated D-dimer. Acute respiratory failure. COPD an ischemic cardiomyopathy.  EXAM: CT ANGIOGRAPHY CHEST WITH CONTRAST  TECHNIQUE: Multidetector CT imaging of the chest was performed using the standard protocol during bolus administration of intravenous contrast. Multiplanar CT image reconstructions including MIPs were obtained to evaluate the vascular anatomy.  CONTRAST:  OMNIPAQUE IOHEXOL 350 MG/ML SOLN  COMPARISON:  04/13/2013  FINDINGS: Satisfactory opacification of pulmonary arteries noted, and no pulmonary emboli identified. No evidence of thoracic aortic dissection or aneurysm. No evidence of mediastinal hematoma or mass. Shotty mediastinal and hilar lymph nodes remain stable, with largest precarinal lymph node measuring 8 mm. No pathologically enlarged lymph nodes identified.  No evidence of pleural or pericardial effusion. Mild pulmonary emphysema again noted. New airspace  disease is seen in the peripheral right lower lobe, suspicious for pneumonia. No evidence of central endobronchial obstruction. A 3 mm subpleural nodule in the right middle lobe on image 56 remains stable. No other suspicious pulmonary nodules or masses are identified.  Review of the MIP images confirms the above findings.  IMPRESSION: No evidence of pulmonary embolism.  New right lower lobe airspace disease, suspicious for pneumonia. Recommend short-term radiographic followup in several weeks to confirm resolution.  Mild emphysema.   Electronically Signed   By: Myles Rosenthal M.D.   On: 06/05/2013 23:40   Dg  Chest Portable 1 View  06/05/2013   CLINICAL DATA:  Respiratory distress, history of stroke and diabetes, hypertension.  EXAM: PORTABLE CHEST - 1 VIEW  COMPARISON:  Chest radiograph June 03, 2013  FINDINGS: Cardiomediastinal silhouette is nonsuspicious, mildly calcified aortic knob. Similar mild chronic interstitial changes and pulmonary hyperexpansion without superimposed pleural effusions or focal consolidations. No pneumothorax. Osteopenia. Multiple EKG lines overlie the patient and may obscure subtle underlying pathology. Soft tissue planes are nonsuspicious.  IMPRESSION: Emphysematous changes without superimposed acute cardiopulmonary process.   Electronically Signed   By: Awilda Metro   On: 06/05/2013 03:35    Microbiology: Recent Results (from the past 240 hour(s))  URINE CULTURE     Status: None   Collection Time    06/05/13  3:34 AM      Result Value Range Status   Specimen Description URINE, CATHETERIZED   Final   Special Requests NONE   Final   Culture  Setup Time     Final   Value: 06/05/2013 15:01     Performed at [facility] Count     Final   Value: >=100,000 COLONIES/ML     Performed at [facility]   Culture     Final   Value: YEAST     Performed at [facility]   Report Status 06/06/2013 FINAL   Final  MRSA PCR SCREENING      Status: None   Collection Time    06/05/13  7:46 AM      Result Value Range Status   MRSA by PCR NEGATIVE  NEGATIVE Final   Comment:            The GeneXpert MRSA Assay (FDA     approved for NASAL specimens     only), is one component of a     comprehensive MRSA colonization     surveillance program. It is not     intended to diagnose MRSA     infection nor to guide or     monitor treatment for     MRSA infections.     Labs: Basic Metabolic Panel:  Recent Labs Lab 06/05/13 0300 06/05/13 1033 06/06/13 0513 06/08/13 0554 06/08/13 0735  NA 139  --  139 139  --   K 4.4  --  3.4* 2.9*  --   CL 97  --  101 99  --   CO2 30  --  29 29  --   GLUCOSE 215*  --  283* 144*  --   BUN 22  --  15 22  --   CREATININE 0.50 0.47* 0.44* 0.53  --   CALCIUM 10.8*  --  9.8 10.3  --   MG  --   --   --   --  2.1   Liver Function Tests:  Recent Labs Lab 06/05/13 0300  AST 15  ALT 14  ALKPHOS 112  BILITOT 0.3  PROT 8.2  ALBUMIN 3.6   No results found for this basename: LIPASE, AMYLASE,  in the last 168 hours No results found for this basename: AMMONIA,  in the last 168 hours CBC:  Recent Labs Lab 06/05/13 0300 06/05/13 1033 06/06/13 0513 06/06/13 1329 06/08/13 0554  WBC 13.0* 12.2* 12.5* 14.9* 15.3*  NEUTROABS  --   --   --   --  10.1*  HGB 10.9* 9.9* 8.3* 8.6* 10.1*  HCT 33.9* 30.9* 25.6* 26.7* 31.6*  MCV 92.1 92.0 91.8 91.1 90.3  PLT 371 343 301 324 353   Cardiac Enzymes:  Recent Labs Lab 06/05/13 0306 06/05/13 1033 06/05/13 1540  TROPONINI <0.30 <0.30 <0.30   BNP: BNP (last 3 results)  Recent Labs  06/26/12 2320 06/05/13 0306  PROBNP 150.2* 94.8   CBG:  Recent Labs Lab 06/07/13 1739 06/07/13 2228 06/07/13 2251 06/08/13 0029 06/08/13 0559  GLUCAP 224* 191* 210* 223* 128*   Signed:  Danielly Ackerley K  [facility] 06/08/2013, 11:49 AM

## 2013-06-09 ENCOUNTER — Emergency Department (HOSPITAL_COMMUNITY): Payer: Medicare Other

## 2013-06-09 ENCOUNTER — Encounter (HOSPITAL_COMMUNITY): Payer: Self-pay | Admitting: Emergency Medicine

## 2013-06-09 ENCOUNTER — Emergency Department (HOSPITAL_COMMUNITY)
Admission: EM | Admit: 2013-06-09 | Discharge: 2013-06-10 | Disposition: A | Discharge status: Skilled Nursing Facility | Payer: Medicare Other | Attending: Emergency Medicine | Admitting: Emergency Medicine

## 2013-06-09 DIAGNOSIS — R0989 Other specified symptoms and signs involving the circulatory and respiratory systems: Secondary | ICD-10-CM

## 2013-06-09 DIAGNOSIS — R29810 Facial weakness: Secondary | ICD-10-CM | POA: Insufficient documentation

## 2013-06-09 DIAGNOSIS — F411 Generalized anxiety disorder: Secondary | ICD-10-CM | POA: Insufficient documentation

## 2013-06-09 DIAGNOSIS — I10 Essential (primary) hypertension: Secondary | ICD-10-CM | POA: Insufficient documentation

## 2013-06-09 DIAGNOSIS — Z8673 Personal history of transient ischemic attack (TIA), and cerebral infarction without residual deficits: Secondary | ICD-10-CM | POA: Insufficient documentation

## 2013-06-09 DIAGNOSIS — Z794 Long term (current) use of insulin: Secondary | ICD-10-CM | POA: Insufficient documentation

## 2013-06-09 DIAGNOSIS — E119 Type 2 diabetes mellitus without complications: Secondary | ICD-10-CM | POA: Insufficient documentation

## 2013-06-09 DIAGNOSIS — Z79899 Other long term (current) drug therapy: Secondary | ICD-10-CM | POA: Insufficient documentation

## 2013-06-09 DIAGNOSIS — IMO0002 Reserved for concepts with insufficient information to code with codable children: Secondary | ICD-10-CM | POA: Insufficient documentation

## 2013-06-09 DIAGNOSIS — J449 Chronic obstructive pulmonary disease, unspecified: Secondary | ICD-10-CM | POA: Insufficient documentation

## 2013-06-09 DIAGNOSIS — G8190 Hemiplegia, unspecified affecting unspecified side: Secondary | ICD-10-CM | POA: Insufficient documentation

## 2013-06-09 DIAGNOSIS — Z7982 Long term (current) use of aspirin: Secondary | ICD-10-CM | POA: Insufficient documentation

## 2013-06-09 DIAGNOSIS — Z7902 Long term (current) use of antithrombotics/antiplatelets: Secondary | ICD-10-CM | POA: Insufficient documentation

## 2013-06-09 DIAGNOSIS — I252 Old myocardial infarction: Secondary | ICD-10-CM | POA: Insufficient documentation

## 2013-06-09 DIAGNOSIS — J4489 Other specified chronic obstructive pulmonary disease: Secondary | ICD-10-CM | POA: Insufficient documentation

## 2013-06-09 DIAGNOSIS — R000 Tachycardia, unspecified: Secondary | ICD-10-CM | POA: Insufficient documentation

## 2013-06-09 DIAGNOSIS — R6889 Other general symptoms and signs: Secondary | ICD-10-CM | POA: Insufficient documentation

## 2013-06-09 DIAGNOSIS — I509 Heart failure, unspecified: Secondary | ICD-10-CM | POA: Insufficient documentation

## 2013-06-09 DIAGNOSIS — Z87891 Personal history of nicotine dependence: Secondary | ICD-10-CM | POA: Insufficient documentation

## 2013-06-09 DIAGNOSIS — Z792 Long term (current) use of antibiotics: Secondary | ICD-10-CM | POA: Insufficient documentation

## 2013-06-09 DIAGNOSIS — Z8701 Personal history of pneumonia (recurrent): Secondary | ICD-10-CM | POA: Insufficient documentation

## 2013-06-09 DIAGNOSIS — Z862 Personal history of diseases of the blood and blood-forming organs and certain disorders involving the immune mechanism: Secondary | ICD-10-CM | POA: Insufficient documentation

## 2013-06-09 DIAGNOSIS — Z9889 Other specified postprocedural states: Secondary | ICD-10-CM | POA: Insufficient documentation

## 2013-06-09 DIAGNOSIS — R4701 Aphasia: Secondary | ICD-10-CM | POA: Insufficient documentation

## 2013-06-09 LAB — COMPREHENSIVE METABOLIC PANEL
AST: 12 U/L (ref 0–37)
BUN: 24 mg/dL — ABNORMAL HIGH (ref 6–23)
CO2: 22 mEq/L (ref 19–32)
Calcium: 10.1 mg/dL (ref 8.4–10.5)
Chloride: 94 mEq/L — ABNORMAL LOW (ref 96–112)
Creatinine, Ser: 0.58 mg/dL (ref 0.50–1.10)
GFR calc Af Amer: >90 mL/min (ref >90)
GFR calc non Af Amer: >90 mL/min (ref >90)
Glucose, Bld: 237 mg/dL — ABNORMAL HIGH (ref 70–99)
Sodium: 132 mEq/L — ABNORMAL LOW (ref 135–145)
Total Bilirubin: 0.3 mg/dL (ref 0.3–1.2)

## 2013-06-09 LAB — URINALYSIS, ROUTINE W REFLEX MICROSCOPIC
Bilirubin Urine: NEGATIVE
Glucose, UA: >1000 mg/dL — AB
Hgb urine dipstick: NEGATIVE
Ketones, ur: NEGATIVE mg/dL
Leukocytes, UA: NEGATIVE
Nitrite: NEGATIVE
Protein, ur: NEGATIVE mg/dL
Urobilinogen, UA: 0.2 mg/dL (ref 0.0–1.0)
pH: 6.5 (ref 5.0–8.0)

## 2013-06-09 LAB — CBC WITH DIFFERENTIAL/PLATELET
Eosinophils Absolute: 0 10*3/uL (ref 0.0–0.7)
Eosinophils Relative: 0 % (ref 0–5)
HCT: 33.8 % — ABNORMAL LOW (ref 36.0–46.0)
Lymphocytes Relative: 10 % — ABNORMAL LOW (ref 12–46)
Lymphs Abs: 1.5 10*3/uL (ref 0.7–4.0)
MCH: 29.3 pg (ref 26.0–34.0)
MCV: 90.1 fL (ref 78.0–100.0)
Monocytes Absolute: 0.5 10*3/uL (ref 0.1–1.0)
Monocytes Relative: 3 % (ref 3–12)
Neutro Abs: 13.1 10*3/uL — ABNORMAL HIGH (ref 1.7–7.7)
RBC: 3.75 MIL/uL — ABNORMAL LOW (ref 3.87–5.11)
RDW: 14.2 % (ref 11.5–15.5)
WBC: 15.1 10*3/uL — ABNORMAL HIGH (ref 4.0–10.5)

## 2013-06-09 LAB — TROPONIN I: Troponin I: <0.3 ng/mL (ref <0.30)

## 2013-06-09 LAB — URINE MICROSCOPIC-ADD ON

## 2013-06-09 LAB — PRO B NATRIURETIC PEPTIDE: Pro B Natriuretic peptide (BNP): 84.8 pg/mL (ref 0–125)

## 2013-06-09 MED ORDER — SODIUM CHLORIDE 0.9 % IV BOLUS (SEPSIS)
1000.0000 mL | Freq: Once | INTRAVENOUS | Status: AC
  Administered 2013-06-09: 1000 mL via INTRAVENOUS

## 2013-06-09 MED ORDER — IPRATROPIUM BROMIDE 0.02 % IN SOLN
0.5000 mg | Freq: Once | RESPIRATORY_TRACT | Status: AC
  Administered 2013-06-09: 0.5 mg via RESPIRATORY_TRACT
  Filled 2013-06-09: qty 2.5

## 2013-06-09 MED ORDER — ALBUTEROL SULFATE (5 MG/ML) 0.5% IN NEBU
5.0000 mg | INHALATION_SOLUTION | Freq: Once | RESPIRATORY_TRACT | Status: AC
  Administered 2013-06-09: 5 mg via RESPIRATORY_TRACT
  Filled 2013-06-09: qty 1

## 2013-06-09 NOTE — ED Provider Notes (Signed)
CSN: [id number]     Arrival date & time 06/09/13  2102 History   First MD Initiated Contact with Patient 06/09/13 2109     This chart was scribed for Ward Givens, MD by Arlan Organ, ED Scribe. This patient was seen in room APA04/[facility]04 and the patient's care was started 9:12 PM.   Chief Complaint  Patient presents with  . Shortness of Breath  . Tachycardia    LEVEL 5 CAVEAT DUE TO PT BEING NON VERBAL  HPI HPI Comments: Christine Durham is a 72 y.o. female brought in by ambulance from the [facility], who presents to the Emergency Department for  SOB that started prior to arrival. Pt also presented with low oxygen and tachycardia per her NH. EMS states pt did not appear distressed at time of arrival to [facility], and oxygen level improved when arriving to [facility]. Pt has a hx of a stroke in 2011. She also has a hx of right hemiparesis, aphasia, DM, aspirations, nstemi, and CHF with an EF of 25% in October of 2014. Pt recently had PEG inserted b/o aspiration pneumonias. Pt still on antibiotics (levaquin) since discharged two days ago from the hospital for aspiration pneumonia.   PCP Dr Laroy Apple  Past Medical History  Diagnosis Date  . Stroke 2011    with right hemiparesis and does not use her left leg  . Diabetes mellitus without complication   . Asthma   . HTN (hypertension)   . Tobacco abuse   . 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 COPD (chronic obstructive pulmonary disease)   . Dysphagia     s/p PEG tube  . Ischemic cardiomyopathy     EF 25% cath 04/2013, grade 1 DD  . Normocytic anemia    Past Surgical History  Procedure Laterality Date  . Appendectomy    . Peg placement N/A 05/05/2013    Procedure: PERCUTANEOUS ENDOSCOPIC GASTROSTOMY (PEG) PLACEMENT;  Surgeon: Malissa Hippo, MD;  Location: [facility] ENDO SUITE;  Service: Endoscopy;  Laterality: N/A;  . Esophagogastroduodenoscopy N/A 05/05/2013    Procedure: ESOPHAGOGASTRODUODENOSCOPY (EGD);  Surgeon: Malissa Hippo, MD;  Location:  [facility] ENDO [facility];  Service: Endoscopy;  Laterality: N/A;   Family History  Problem Relation Age of Onset  . Diabetes Sister   . Hyperlipidemia Sister   . Hypertension Sister   . COPD Neg Hx   . Stroke Mother    History  Substance Use Topics  . Smoking status: Former Smoker -- 0.50 packs/day    Types: Cigarettes    Quit date: 06/21/2012  . Smokeless tobacco: Former Neurosurgeon  . Alcohol Use: No   Lives in NH  OB History   Grav Para Term Preterm Abortions TAB SAB Ect Mult Living                 Review of Systems  Unable to perform ROS: Other    Allergies  Review of patient's allergies indicates no known allergies.  Home Medications   Current Outpatient Rx  Name  Route  Sig  Dispense  Refill  . acetaminophen (TYLENOL) 160 MG/5ML liquid   Per Tube   Place 500 mg into feeding tube every 4 (four) hours as needed for fever or pain.         Marland Kitchen albuterol (PROVENTIL) (2.5 MG/3ML) 0.083% nebulizer solution   Nebulization   Take 2.5 mg by nebulization every 4 (four) hours as needed for wheezing or shortness of breath.         Marland Kitchen  Amino Acids-Protein Hydrolys (FEEDING SUPPLEMENT, PRO-STAT SUGAR FREE 64,) LIQD   Per Tube   Place 30 mLs into feeding tube daily.   900 mL   0   . amLODipine (NORVASC) 5 MG tablet   Per Tube   Place 10 mg into feeding tube daily.         Marland Kitchen aspirin 81 MG chewable tablet   Oral   Chew 81 mg by mouth daily.         Marland Kitchen atorvastatin (LIPITOR) 10 MG tablet   Oral   Take 20 mg by mouth daily.         . chlorhexidine (PERIDEX) 0.12 % solution   Mouth/Throat   Use as directed 15 mLs in the mouth or throat 2 (two) times daily. Please swab in mouth and suction residual.         . clopidogrel (PLAVIX) 75 MG tablet   Per Tube   Place 75 mg into feeding tube daily with breakfast.         . insulin glargine (LANTUS) 100 UNIT/ML injection   Subcutaneous   Inject 10 Units into the skin at bedtime.         Marland Kitchen levofloxacin (LEVAQUIN) 25 MG/ML  solution   Oral   Take 20 mLs (500 mg total) by mouth daily.   75 mL   0     Please fill 8 (eight) day supply, zero refills   . metFORMIN (GLUCOPHAGE) 500 MG tablet   Per Tube   Place 500 mg into feeding tube 2 (two) times daily with a meal.         . Multiple Vitamin (MULTIVITAMIN) LIQD   Per Tube   Place 5 mLs into feeding tube daily.         . Nutritional Supplements (FEEDING SUPPLEMENT, GLUCERNA 1.2 CAL,) LIQD   Per Tube   Place 1,000 mLs into feeding tube daily. At 50 ml/h   10000 mL   0   . pantoprazole sodium (PROTONIX) 40 mg/20 mL PACK   Per Tube   Place 20 mLs (40 mg total) into feeding tube daily.   30 each   0   . predniSONE (DELTASONE) 5 MG tablet   Oral   Take 1 tablet (5 mg total) by mouth daily with breakfast.           Taper dose: 60mg  per tube x 3 days, then 40mg  x 3  ...   . Water For Irrigation, Sterile (FREE WATER) SOLN   Per Tube   Place 250 mLs into feeding tube every 6 (six) hours.         . potassium chloride (KLOR-CON) 20 MEQ packet   Per Tube   Place 40 mEq into feeding tube daily.           One month supply, zero refills    BP 149/82  Pulse 124  Temp(Src) 100.4 F (38 C) (Rectal)  Resp 36  Wt 119 lb (53.978 kg)  SpO2 100%  Vital signs normal except for tachycardia, low grade fever .  Physical Exam  Nursing note and vitals reviewed. Constitutional: She is oriented to person, place, and time. She appears well-developed and well-nourished.  Non-toxic appearance. She does not appear ill. No distress.  Awake but does not follow commands or respond, seems to be anxious   HENT:  Head: Normocephalic and atraumatic.  Right Ear: External ear normal.  Left Ear: External ear normal.  Nose: Nose normal. No mucosal edema  or rhinorrhea.  Mouth/Throat: Oropharynx is clear and moist and mucous membranes are normal. No dental abscesses or uvula swelling.  Eyes: Conjunctivae and EOM are normal. Pupils are equal, round, and reactive  to light.  Neck: Normal range of motion and full passive range of motion without pain. Neck supple.  Cardiovascular: Exam reveals no gallop and no friction rub.   No murmur heard. Pulmonary/Chest: Effort normal and breath sounds normal. No respiratory distress. She has no wheezes. She has no rhonchi. She has no rales. She exhibits no tenderness and no crepitus.  Tachypnea Coughing Course breath sounds Mild retractions Abdominal breathing   Abdominal: Soft. Normal appearance and bowel sounds are normal. She exhibits no distension. There is no tenderness. There is no rebound and no guarding.  Musculoskeletal: Normal range of motion. She exhibits no edema and no tenderness.  Moves all extremities well.   Neurological: She is alert and oriented to person, place, and time. She has normal strength. No cranial nerve deficit.  Right facial droop Right hemiparesis   Skin: Skin is warm, dry and intact. No rash noted. No erythema. No pallor.  Psychiatric: Her speech is normal and behavior is normal. Her mood appears anxious.    ED Course  Procedures (including critical care time)  Medications  sodium chloride 0.9 % bolus 1,000 mL (1,000 mLs Intravenous New Bag/Given 06/09/13 2337)  albuterol (PROVENTIL) (5 MG/ML) 0.5% nebulizer solution 5 mg (5 mg Nebulization Given 06/09/13 2335)  ipratropium (ATROVENT) nebulizer solution 0.5 mg (0.5 mg Nebulization Given 06/09/13 2335)     DIAGNOSTIC STUDIES: Oxygen Saturation is 98% on RA, Normal by my interpretation.    COORDINATION OF CARE: 9:12 PM- Will order X-Ray, in and out cath, urinalysis,  and blood work. Discussed treatment plan with pt at bedside and pt agreed to plan.    Patient appears calmer. Family states the nursing home panics and she seems to have choking episodes. Family states she does better if they suction her and due her breathing treatment. Patient is RA on antibiotics for aspiration pneumonia her labs are unchanged. Patient is  appearing much calmer now, family is comfortable taking her back to her nursing home.   Labs Review Results for orders placed during the hospital encounter of 06/09/13  CBC WITH DIFFERENTIAL      Result Value Range   WBC 15.1 (*) 4.0 - 10.5 K/uL   RBC 3.75 (*) 3.87 - 5.11 MIL/uL   Hemoglobin 11.0 (*) 12.0 - 15.0 g/dL   HCT 16.1 (*) 09.6 - 04.5 %   MCV 90.1  78.0 - 100.0 fL   MCH 29.3  26.0 - 34.0 pg   MCHC 32.5  30.0 - 36.0 g/dL   RDW 40.9  81.1 - 91.4 %   Platelets 406 (*) 150 - 400 K/uL   Neutrophils Relative % 87 (*) 43 - 77 %   Neutro Abs 13.1 (*) 1.7 - 7.7 K/uL   Lymphocytes Relative 10 (*) 12 - 46 %   Lymphs Abs 1.5  0.7 - 4.0 K/uL   Monocytes Relative 3  3 - 12 %   Monocytes Absolute 0.5  0.1 - 1.0 K/uL   Eosinophils Relative 0  0 - 5 %   Eosinophils Absolute 0.0  0.0 - 0.7 K/uL   Basophils Relative 0  0 - 1 %   Basophils Absolute 0.0  0.0 - 0.1 K/uL  COMPREHENSIVE METABOLIC PANEL      Result Value Range   Sodium 132 (*) 135 -  145 mEq/L   Potassium 4.4  3.5 - 5.1 mEq/L   Chloride 94 (*) 96 - 112 mEq/L   CO2 22  19 - 32 mEq/L   Glucose, Bld 237 (*) 70 - 99 mg/dL   BUN 24 (*) 6 - 23 mg/dL   Creatinine, Ser 1.61  0.50 - 1.10 mg/dL   Calcium 09.6  8.4 - 04.5 mg/dL   Total Protein 7.5  6.0 - 8.3 g/dL   Albumin 3.3 (*) 3.5 - 5.2 g/dL   AST 12  0 - 37 U/L   ALT 14  0 - 35 U/L   Alkaline Phosphatase 96  39 - 117 U/L   Total Bilirubin 0.3  0.3 - 1.2 mg/dL   GFR calc non Af Amer >90  >90 mL/min   GFR calc Af Amer >90  >90 mL/min  PRO B NATRIURETIC PEPTIDE      Result Value Range   Pro B Natriuretic peptide (BNP) 84.8  0 - 125 pg/mL  TROPONIN I      Result Value Range   Troponin I <0.30  <0.30 ng/mL  URINALYSIS, ROUTINE W REFLEX MICROSCOPIC      Result Value Range   Color, Urine YELLOW  YELLOW   APPearance CLEAR  CLEAR   Specific Gravity, Urine 1.020  1.005 - 1.030   pH 6.5  5.0 - 8.0   Glucose, UA >1000 (*) NEGATIVE mg/dL   Hgb urine dipstick NEGATIVE  NEGATIVE    Bilirubin Urine NEGATIVE  NEGATIVE   Ketones, ur NEGATIVE  NEGATIVE mg/dL   Protein, ur NEGATIVE  NEGATIVE mg/dL   Urobilinogen, UA 0.2  0.0 - 1.0 mg/dL   Nitrite NEGATIVE  NEGATIVE   Leukocytes, UA NEGATIVE  NEGATIVE  URINE MICROSCOPIC-ADD ON      Result Value Range   Squamous Epithelial / LPF FEW (*) RARE   WBC, UA 0-2  <3 WBC/hpf   Bacteria, UA RARE  RARE   Urine-Other FEW YEAST      Laboratory interpretation all normal except mild hyperglycemia, glucosuria, improving and anemia, stable leukocytosis   Dg Chest Portable 1 View  06/09/2013   CLINICAL DATA:  Short of breath  EXAM: PORTABLE CHEST - 1 VIEW  COMPARISON:  06/05/2013  FINDINGS: Normal heart size. Lungs are under aerated. Hazy opacity at both lung bases likely represents atelectasis. No pneumothorax. Chronic appearing left-sided rib deformities.  IMPRESSION: Bibasilar atelectasis.   Electronically Signed   By: Maryclare Bean M.D.   On: 06/09/2013 21:41   Dg Chest 1 View  06/03/2013     IMPRESSION: No active disease.   Electronically Signed   By: Charlett Nose M.D.   On: 06/03/2013 20:39   Dg Chest 1 View  05/16/2013    IMPRESSION: 1. Interim clearing of interstitial prominence noted on prior chest x-ray of 05/06/2013. 2. Mild atelectasis left lung base. Followup chest x-ray to exclude developing basilar pneumonia should be considered.   Electronically Signed   By: Maisie Fus  Register   On: 05/16/2013 16:58   Ct Angio Chest Pe W/cm &/or Wo Cm  06/05/2013   IMPRESSION: No evidence of pulmonary embolism.  New right lower lobe airspace disease, suspicious for pneumonia. Recommend short-term radiographic followup in several weeks to confirm resolution.  Mild emphysema.   Electronically Signed   By: Myles Rosenthal M.D.   On: 06/05/2013 23:40    Dg Chest Portable 1 View  06/05/2013  s.  IMPRESSION: Emphysematous changes without superimposed acute cardiopulmonary process.  Electronically Signed   By: Awilda Metro   On: 06/05/2013  03:35        EKG Interpretation    Date/Time:  Friday June 09 2013 21:09:40 EST Ventricular Rate:  127 PR Interval:  128 QRS Duration: 62 QT Interval:  302 QTC Calculation: 438 R Axis:   -25 Text Interpretation:  Sinus tachycardia with Premature supraventricular complexes Cannot rule out Anterior infarct (cited on or before 26-Jun-2012) Baseline wander Electrode noise When compared with ECG of 05-Jun-2013 02:47, no significant change is seen Confirmed by Macon Lesesne  MD-I, Caleb Decock ([id number]) on 06/09/2013 11:15:09 PM            MDM   1. Choking episode    Plan discharge   Devoria Albe, MD, Franz Dell, MD 06/09/13 2351

## 2013-06-09 NOTE — ED Notes (Signed)
EDP aware of pts rectal temp and families concern for pt not to be able to cough phlegm up.

## 2013-06-09 NOTE — ED Notes (Signed)
[facility] called about pt stating pt has been sob with low 02 and tachycardic today.

## 2013-06-09 NOTE — ED Notes (Signed)
Family concerned about pt not being able to cough up phlegm.

## 2013-06-10 ENCOUNTER — Inpatient Hospital Stay
Admission: RE | Admit: 2013-06-10 | Discharge: 2013-06-25 | Payer: Medicare Other | Source: Ambulatory Visit | Attending: Internal Medicine | Admitting: Internal Medicine

## 2013-06-11 LAB — URINE CULTURE: Colony Count: 55000

## 2013-06-12 ENCOUNTER — Non-Acute Institutional Stay (SKILLED_NURSING_FACILITY): Payer: Medicare Other | Admitting: Internal Medicine

## 2013-06-12 DIAGNOSIS — I420 Dilated cardiomyopathy: Secondary | ICD-10-CM

## 2013-06-12 DIAGNOSIS — J189 Pneumonia, unspecified organism: Secondary | ICD-10-CM

## 2013-06-12 DIAGNOSIS — J441 Chronic obstructive pulmonary disease with (acute) exacerbation: Secondary | ICD-10-CM

## 2013-06-12 DIAGNOSIS — I428 Other cardiomyopathies: Secondary | ICD-10-CM

## 2013-06-13 LAB — GLUCOSE, CAPILLARY
Glucose-Capillary: 212 mg/dL — ABNORMAL HIGH (ref 70–99)
Glucose-Capillary: 172 mg/dL — ABNORMAL HIGH (ref 70–99)
Glucose-Capillary: 181 mg/dL — ABNORMAL HIGH (ref 70–99)
Glucose-Capillary: 266 mg/dL — ABNORMAL HIGH (ref 70–99)

## 2013-06-14 LAB — GLUCOSE, CAPILLARY
Glucose-Capillary: 176 mg/dL — ABNORMAL HIGH (ref 70–99)
Glucose-Capillary: 192 mg/dL — ABNORMAL HIGH (ref 70–99)
Glucose-Capillary: 293 mg/dL — ABNORMAL HIGH (ref 70–99)
Glucose-Capillary: 265 mg/dL — ABNORMAL HIGH (ref 70–99)

## 2013-06-14 LAB — CULTURE, BLOOD (ROUTINE X 2)

## 2013-06-15 LAB — GLUCOSE, CAPILLARY
Glucose-Capillary: 146 mg/dL — ABNORMAL HIGH (ref 70–99)
Glucose-Capillary: 248 mg/dL — ABNORMAL HIGH (ref 70–99)

## 2013-06-16 ENCOUNTER — Non-Acute Institutional Stay (SKILLED_NURSING_FACILITY): Payer: Medicare Other | Admitting: Internal Medicine

## 2013-06-16 DIAGNOSIS — J449 Chronic obstructive pulmonary disease, unspecified: Secondary | ICD-10-CM

## 2013-06-16 DIAGNOSIS — J189 Pneumonia, unspecified organism: Secondary | ICD-10-CM

## 2013-06-16 LAB — GLUCOSE, CAPILLARY
Glucose-Capillary: 143 mg/dL — ABNORMAL HIGH (ref 70–99)
Glucose-Capillary: 185 mg/dL — ABNORMAL HIGH (ref 70–99)
Glucose-Capillary: 208 mg/dL — ABNORMAL HIGH (ref 70–99)

## 2013-06-17 LAB — GLUCOSE, CAPILLARY
Glucose-Capillary: 182 mg/dL — ABNORMAL HIGH (ref 70–99)
Glucose-Capillary: 199 mg/dL — ABNORMAL HIGH (ref 70–99)
Glucose-Capillary: 293 mg/dL — ABNORMAL HIGH (ref 70–99)

## 2013-06-18 LAB — GLUCOSE, CAPILLARY
Glucose-Capillary: 155 mg/dL — ABNORMAL HIGH (ref 70–99)
Glucose-Capillary: 242 mg/dL — ABNORMAL HIGH (ref 70–99)
Glucose-Capillary: 271 mg/dL — ABNORMAL HIGH (ref 70–99)

## 2013-06-18 NOTE — Progress Notes (Signed)
Patient ID: Christine Durham, female   DOB: Feb 20, 1941, 72 y.o.   MRN: [id number]  This is an acute visit.  Local care skilled.  Facility [facility].  Chief complaint-acute visit followup respiratory issues.  History of present illness.  Patient is a very frail elderly female with a history of COPD with acute exasperations as well as acute respiratory failure-suspected aspiration pneumonia history.  She also has a history of systolic heart failure ejection fraction 25% per recent cardiac catheterization.  She also has severe dysphagia status post PEG tube with history of CVA.  She recently was hospitalized for acute hypoxic respiratory failure secondary to acute COPD exasperation and health care versus aspiration pneumonia-she was put on steroids and antibiotics-she is just finishing a course of Levaquin which she was transitioned to before discharge.  According nursing she is relatively stable apparently had a low-grade temperature 2 days ago but this has been stable today.  She has coughed up some green sputum.  Patient is aphasic and cannot give really any review of systems.  In the medical social history as been reviewed per hospital discharge summary on 06/08/2013.  Medications have been reviewed per MAR.  Review of systems essentially unobtainable please see history of present illness patient is a phasic.  Physical exam.  Temperature is 97.8 pulse 95 respirations 18 blood pressure 120/69.  In general this is a frail elderly female in no distress lying comfortably in bed.  Her skin is warm and dry.  Chest is clear to auscultation however shallow air entry no labored breathing.  Heart is regular rate and rhythm there is no lower extremity edema.  Abdomen is soft nontender PEG site appears unremarkable.  Neurologic she has left-sided hemiparalysis which is baseline.  Labs.  06/09/2013.  WBC 15.1 hemoglobin 11 platelets 406.  Sodium 132 potassium 4.4 BUN 24 creatinine  0.58.  Assessment and plan.  #1-history of COPD exasperation-pneumonia--at this point she is extremely frail but appears to be relatively stable currently afebrile I do not see labored breathing or any sign of distress-she has just completed a course of Levaquin-I did discuss this with Dr. Leanord Hawking via phone at this point will continue to monitor closely with vital signs.  . [id number]

## 2013-06-19 LAB — GLUCOSE, CAPILLARY
Glucose-Capillary: 226 mg/dL — ABNORMAL HIGH (ref 70–99)
Glucose-Capillary: 207 mg/dL — ABNORMAL HIGH (ref 70–99)
Glucose-Capillary: 285 mg/dL — ABNORMAL HIGH (ref 70–99)
Glucose-Capillary: 305 mg/dL — ABNORMAL HIGH (ref 70–99)
Glucose-Capillary: 264 mg/dL — ABNORMAL HIGH (ref 70–99)

## 2013-06-20 LAB — GLUCOSE, CAPILLARY
Glucose-Capillary: 235 mg/dL — ABNORMAL HIGH (ref 70–99)
Glucose-Capillary: 275 mg/dL — ABNORMAL HIGH (ref 70–99)
Glucose-Capillary: 309 mg/dL — ABNORMAL HIGH (ref 70–99)
Glucose-Capillary: 287 mg/dL — ABNORMAL HIGH (ref 70–99)

## 2013-06-21 ENCOUNTER — Non-Acute Institutional Stay (SKILLED_NURSING_FACILITY): Payer: Medicare Other | Admitting: Internal Medicine

## 2013-06-21 DIAGNOSIS — L309 Dermatitis, unspecified: Secondary | ICD-10-CM

## 2013-06-21 DIAGNOSIS — J441 Chronic obstructive pulmonary disease with (acute) exacerbation: Secondary | ICD-10-CM

## 2013-06-21 DIAGNOSIS — L259 Unspecified contact dermatitis, unspecified cause: Secondary | ICD-10-CM

## 2013-06-21 DIAGNOSIS — E119 Type 2 diabetes mellitus without complications: Secondary | ICD-10-CM

## 2013-06-21 DIAGNOSIS — R197 Diarrhea, unspecified: Secondary | ICD-10-CM

## 2013-06-21 LAB — GLUCOSE, CAPILLARY
Glucose-Capillary: 185 mg/dL — ABNORMAL HIGH (ref 70–99)
Glucose-Capillary: 275 mg/dL — ABNORMAL HIGH (ref 70–99)

## 2013-06-21 NOTE — Progress Notes (Signed)
Patient ID: Christine Durham, female   DOB: 1940-09-04, 72 y.o.   MRN: [id number] This is an acute visit.  Local care skilled.  Facility [facility].   Chief complaint--acute visit secondary to PEG tube issues-diarrhea-respiratory issues    History of present illness.  Patient is a very frail elderly female with a history of COPD with acute exasperations as well as acute respiratory failure-suspected aspiration pneumonia history.  She also has a history of systolic heart failure ejection fraction 25% per recent cardiac catheterization.  She also has severe dysphagia status post PEG tube with history of CVA.  She recently was hospitalized for acute hypoxic respiratory failure secondary to acute COPD exasperation and health care versus aspiration pneumonia-she was put on steroids and antibiotics-she is finishing a course of Levaquin we extended this recently secondary to some continued temperatures-she has been afebrile recently--.  Apparently this morning she did have a large loose stool-patient is a phasic and cannot give any review of systems-vital signs however appear to be stable.  Nursing staff has also noted some mild inflammation around the PEG site and asked me to look at it  I have also reviewed her blood sugars-appear to run in the mid-high 100s in the a.m. more higher 100s to 200s at 10-  amlater in the day mostly in the 200 range.   .  Patient is aphasic and cannot give really any review of systems.   Family medical social history as been reviewed per hospital discharge summary on 06/08/2013 .  Medications have been reviewed per MAR .  Review of systems essentially unobtainable please see history of present illness patient is a phasic .  Physical exam.  Temperature 98.3 pulse 104 respirations 22 blood pressure 120/70--O2 saturation 96% on 2 L oxygen.  In general this is a frail elderly female in no distress lying comfortably her geri chiar.  Her skin is warm and dry.  Chest is clear to  auscultation however shallow air entry no labored breathing.  Heart is regular rate and rhythm--slightly tachycardic- there is no lower extremity edema.  Abdomen is soft nontender PEG site appears fairly benign-there is a minimal area of red tissue immediately by the PEG site but this is not extending beyond that there is no drainage bleeding or firmness there  There are active bowel sounds I do not really note significant tenderness to palpation Neurologic she has left-sided hemiparalysis which is baseline.   Labs  06/19/2013.  WBC 15.3 hemoglobin 10.0 platelets 382.  Sodium 142 potassium 3.8 BUN 21 creatinine 0.51  .  06/09/2013.  WBC 15.1 hemoglobin 11 platelets 406.  Sodium 132 potassium 4.4 BUN 24 creatinine 0.58 .  Assessment and plan.  Diarrhea-  --will order C. difficile culture and monitor-clinically she appears to be at baseline.--Of note she has been on antibiotics  I note a high white count but this is baseline with previous levels she is on prednisone.--update  a CBC and BMP tomorrow  #2-PEG tube issues--dermatitis-I do not see an active infection here will order topical antibiotic twice a day to area and close monitoring-certainly we'll have to keep an eye on this.  #3 history of respiratory infection--  history COPD-exasperation-she has completed an extended course of Levaquin at this point appears to be relatively stable-she is afebrile appears to be comfortable-although very fragile in this regard.  #4-diabetes type 2-blood sugars appear to run largely in the mid--high 100s with more 200s later in the day--- is on Lantus 10 units a  day as well as Glucophage 500 mg twice a day-we'll increase her Lantus slightly to 12 units and monitor...   . [id number]

## 2013-06-22 ENCOUNTER — Non-Acute Institutional Stay (SKILLED_NURSING_FACILITY): Payer: Medicare Other | Admitting: Internal Medicine

## 2013-06-22 DIAGNOSIS — D72829 Elevated white blood cell count, unspecified: Secondary | ICD-10-CM

## 2013-06-22 DIAGNOSIS — E870 Hyperosmolality and hypernatremia: Secondary | ICD-10-CM

## 2013-06-22 LAB — GLUCOSE, CAPILLARY
Glucose-Capillary: 191 mg/dL — ABNORMAL HIGH (ref 70–99)
Glucose-Capillary: 230 mg/dL — ABNORMAL HIGH (ref 70–99)
Glucose-Capillary: 346 mg/dL — ABNORMAL HIGH (ref 70–99)

## 2013-06-22 NOTE — Progress Notes (Signed)
Patient ID: Christine Durham, female   DOB: 13-Mar-1941, 72 y.o.   MRN: [id number]  This is an acute visit.  Facility [facility].  Level of care skilled.  Chief complaint-acute visit followup hypernatremia-.  History of present illness.  Patient is a very frail elderly female with history of COPD with acute expirations as well as acute respiratory failure with suspicious of aspiration.  She is status post PEG with a history of severe dysphasia secondary to a history of CVA.  She has a history of acute hypoxic respiratory failure and actually continues on steroids.  I saw her yesterday for a couple of issues including diarrhea we did order a C. difficile culture-results are pending however staff does not report diarrhea today.  Of note she just finished a course of Levaquin for respiratory issues.  I also looked at her PEG site yesterday for concerns of possible some erythema around it however today this actually appears improved from yesterday-orders have been for Bactroban twice a day to area.  Labs were ordered which do show a sodium of 146 this appears to be slowly rising BUN is 27 creatinine 0.45.  She also has an elevated white count of 17-she does have a history of steroid use as noted above-she is afebrile and actually appears comfortable.  Patient is aphasic and cannot really give any review of systems.  Family medical social history skin reviewed her hospital discharge 06/08/2013.  Medications been reviewed per MAR.  Review of systems unobtainable secondary to patient's aphasic status.  Physical exam temperature is 97.9 pulse is 98 respirations 20 blood pressure 131/75.--O2 saturation is 94% on oxygen  In general this is a frail elderly female in no distress lying comfortably in bed.  Her skin is warm and dry past PEG site I do not really see any significant erythema around the site this evening there is no firmness tenderness or drainage.  Chest shallow air entry with some  bronchial sounds  there is no labored breathing.no wheezing  Heart is regular rate and rhythm without murmur gallop or rub she does not have significant lower extremity edema.  Abdomen soft nontender again PEG site looks quite benign.  There are positive bowel sounds.  Labs.  WBC 17.0 hemoglobin 11.7 platelets 423.  Granulocyte percent is 83 absolute grands 14.  Sodium 146 potassium 4.4 BUN 27 creatinine 0.45.  Assessment and plan.  #1-hyponatremia-sodium appears to be trending up somewhat--will increase flushes with medications every shift to 50 cc from 30 and update a metabolic panel first lab day next week  #2-diarrhea-nursing staff does not report any today-will await C. difficile culture.  #3-leukocytosis-appears to be on the higher end of her   recentbaseline-she is afebrile appears to be comfortable-- exam was stable--- O-2 stats are satisfactory --continue to monitor again she does continue to be fragile in this regard-recheck this on Monday, December 15 as well  [id number]

## 2013-06-23 LAB — GLUCOSE, CAPILLARY
Glucose-Capillary: 226 mg/dL — ABNORMAL HIGH (ref 70–99)
Glucose-Capillary: 292 mg/dL — ABNORMAL HIGH (ref 70–99)
Glucose-Capillary: 307 mg/dL — ABNORMAL HIGH (ref 70–99)
Glucose-Capillary: 307 mg/dL — ABNORMAL HIGH (ref 70–99)

## 2013-06-24 LAB — GLUCOSE, CAPILLARY
Glucose-Capillary: 274 mg/dL — ABNORMAL HIGH (ref 70–99)
Glucose-Capillary: 208 mg/dL — ABNORMAL HIGH (ref 70–99)

## 2013-06-25 ENCOUNTER — Inpatient Hospital Stay (HOSPITAL_COMMUNITY)
Admission: EM | Admit: 2013-06-25 | Discharge: 2013-06-30 | DRG: 871 | Disposition: A | Discharge status: Skilled Nursing Facility | Payer: Medicare Other | Attending: Internal Medicine | Admitting: Internal Medicine

## 2013-06-25 ENCOUNTER — Emergency Department (HOSPITAL_COMMUNITY): Payer: Medicare Other

## 2013-06-25 ENCOUNTER — Encounter (HOSPITAL_COMMUNITY): Payer: Self-pay | Admitting: Emergency Medicine

## 2013-06-25 DIAGNOSIS — Z931 Gastrostomy status: Secondary | ICD-10-CM

## 2013-06-25 DIAGNOSIS — Z8701 Personal history of pneumonia (recurrent): Secondary | ICD-10-CM

## 2013-06-25 DIAGNOSIS — I2589 Other forms of chronic ischemic heart disease: Secondary | ICD-10-CM | POA: Diagnosis present

## 2013-06-25 DIAGNOSIS — J4489 Other specified chronic obstructive pulmonary disease: Secondary | ICD-10-CM | POA: Diagnosis present

## 2013-06-25 DIAGNOSIS — I509 Heart failure, unspecified: Secondary | ICD-10-CM | POA: Diagnosis present

## 2013-06-25 DIAGNOSIS — J9600 Acute respiratory failure, unspecified whether with hypoxia or hypercapnia: Secondary | ICD-10-CM | POA: Diagnosis present

## 2013-06-25 DIAGNOSIS — E875 Hyperkalemia: Secondary | ICD-10-CM | POA: Diagnosis present

## 2013-06-25 DIAGNOSIS — Z823 Family history of stroke: Secondary | ICD-10-CM

## 2013-06-25 DIAGNOSIS — N390 Urinary tract infection, site not specified: Secondary | ICD-10-CM

## 2013-06-25 DIAGNOSIS — Z8249 Family history of ischemic heart disease and other diseases of the circulatory system: Secondary | ICD-10-CM

## 2013-06-25 DIAGNOSIS — I5023 Acute on chronic systolic (congestive) heart failure: Secondary | ICD-10-CM

## 2013-06-25 DIAGNOSIS — G9340 Encephalopathy, unspecified: Secondary | ICD-10-CM

## 2013-06-25 DIAGNOSIS — Z681 Body mass index (BMI) 19 or less, adult: Secondary | ICD-10-CM

## 2013-06-25 DIAGNOSIS — Z66 Do not resuscitate: Secondary | ICD-10-CM | POA: Diagnosis not present

## 2013-06-25 DIAGNOSIS — J690 Pneumonitis due to inhalation of food and vomit: Secondary | ICD-10-CM

## 2013-06-25 DIAGNOSIS — R197 Diarrhea, unspecified: Secondary | ICD-10-CM

## 2013-06-25 DIAGNOSIS — Z87891 Personal history of nicotine dependence: Secondary | ICD-10-CM

## 2013-06-25 DIAGNOSIS — Z720 Tobacco use: Secondary | ICD-10-CM

## 2013-06-25 DIAGNOSIS — I10 Essential (primary) hypertension: Secondary | ICD-10-CM | POA: Diagnosis present

## 2013-06-25 DIAGNOSIS — B3749 Other urogenital candidiasis: Secondary | ICD-10-CM | POA: Diagnosis present

## 2013-06-25 DIAGNOSIS — I959 Hypotension, unspecified: Secondary | ICD-10-CM

## 2013-06-25 DIAGNOSIS — E870 Hyperosmolality and hypernatremia: Secondary | ICD-10-CM | POA: Diagnosis present

## 2013-06-25 DIAGNOSIS — J45909 Unspecified asthma, uncomplicated: Secondary | ICD-10-CM

## 2013-06-25 DIAGNOSIS — I428 Other cardiomyopathies: Secondary | ICD-10-CM

## 2013-06-25 DIAGNOSIS — E872 Acidosis, unspecified: Secondary | ICD-10-CM | POA: Diagnosis present

## 2013-06-25 DIAGNOSIS — I252 Old myocardial infarction: Secondary | ICD-10-CM

## 2013-06-25 DIAGNOSIS — E876 Hypokalemia: Secondary | ICD-10-CM | POA: Diagnosis not present

## 2013-06-25 DIAGNOSIS — R531 Weakness: Secondary | ICD-10-CM

## 2013-06-25 DIAGNOSIS — R627 Adult failure to thrive: Secondary | ICD-10-CM

## 2013-06-25 DIAGNOSIS — J449 Chronic obstructive pulmonary disease, unspecified: Secondary | ICD-10-CM | POA: Diagnosis present

## 2013-06-25 DIAGNOSIS — I69959 Hemiplegia and hemiparesis following unspecified cerebrovascular disease affecting unspecified side: Secondary | ICD-10-CM

## 2013-06-25 DIAGNOSIS — I69920 Aphasia following unspecified cerebrovascular disease: Secondary | ICD-10-CM

## 2013-06-25 DIAGNOSIS — R4701 Aphasia: Secondary | ICD-10-CM | POA: Diagnosis present

## 2013-06-25 DIAGNOSIS — R1310 Dysphagia, unspecified: Secondary | ICD-10-CM | POA: Diagnosis present

## 2013-06-25 DIAGNOSIS — I214 Non-ST elevation (NSTEMI) myocardial infarction: Secondary | ICD-10-CM

## 2013-06-25 DIAGNOSIS — R5381 Other malaise: Secondary | ICD-10-CM

## 2013-06-25 DIAGNOSIS — N179 Acute kidney failure, unspecified: Secondary | ICD-10-CM

## 2013-06-25 DIAGNOSIS — E119 Type 2 diabetes mellitus without complications: Secondary | ICD-10-CM | POA: Diagnosis present

## 2013-06-25 DIAGNOSIS — I639 Cerebral infarction, unspecified: Secondary | ICD-10-CM

## 2013-06-25 DIAGNOSIS — D649 Anemia, unspecified: Secondary | ICD-10-CM | POA: Diagnosis present

## 2013-06-25 DIAGNOSIS — E43 Unspecified severe protein-calorie malnutrition: Secondary | ICD-10-CM | POA: Diagnosis present

## 2013-06-25 DIAGNOSIS — D72829 Elevated white blood cell count, unspecified: Secondary | ICD-10-CM

## 2013-06-25 DIAGNOSIS — R509 Fever, unspecified: Secondary | ICD-10-CM

## 2013-06-25 DIAGNOSIS — A0472 Enterocolitis due to Clostridium difficile, not specified as recurrent: Secondary | ICD-10-CM | POA: Diagnosis present

## 2013-06-25 DIAGNOSIS — J441 Chronic obstructive pulmonary disease with (acute) exacerbation: Secondary | ICD-10-CM

## 2013-06-25 DIAGNOSIS — I69991 Dysphagia following unspecified cerebrovascular disease: Secondary | ICD-10-CM

## 2013-06-25 DIAGNOSIS — A419 Sepsis, unspecified organism: Principal | ICD-10-CM | POA: Diagnosis present

## 2013-06-25 DIAGNOSIS — Z833 Family history of diabetes mellitus: Secondary | ICD-10-CM

## 2013-06-25 HISTORY — DX: Enterocolitis due to Clostridium difficile, not specified as recurrent: A04.72

## 2013-06-25 LAB — CBC WITH DIFFERENTIAL/PLATELET
Basophils Absolute: 0 10*3/uL (ref 0.0–0.1)
Eosinophils Absolute: 0.2 10*3/uL (ref 0.0–0.7)
Eosinophils Relative: 1 % (ref 0–5)
HCT: 41.6 % (ref 36.0–46.0)
Lymphocytes Relative: 19 % (ref 12–46)
Lymphs Abs: 3.6 10*3/uL (ref 0.7–4.0)
MCH: 29.3 pg (ref 26.0–34.0)
Monocytes Relative: 1 % — ABNORMAL LOW (ref 3–12)
Neutro Abs: 14.9 10*3/uL — ABNORMAL HIGH (ref 1.7–7.7)
Neutrophils Relative %: 71 % (ref 43–77)
Platelets: 378 10*3/uL (ref 150–400)
RBC: 4.4 MIL/uL (ref 3.87–5.11)
RDW: 15.7 % — ABNORMAL HIGH (ref 11.5–15.5)
WBC: 18.9 10*3/uL — ABNORMAL HIGH (ref 4.0–10.5)

## 2013-06-25 LAB — GLUCOSE, CAPILLARY
Glucose-Capillary: 304 mg/dL — ABNORMAL HIGH (ref 70–99)
Glucose-Capillary: 332 mg/dL — ABNORMAL HIGH (ref 70–99)
Glucose-Capillary: 230 mg/dL — ABNORMAL HIGH (ref 70–99)
Glucose-Capillary: 179 mg/dL — ABNORMAL HIGH (ref 70–99)

## 2013-06-25 LAB — URINALYSIS, ROUTINE W REFLEX MICROSCOPIC
Glucose, UA: 500 mg/dL — AB
Hgb urine dipstick: NEGATIVE
Ketones, ur: NEGATIVE mg/dL
Nitrite: NEGATIVE
pH: 7 (ref 5.0–8.0)

## 2013-06-25 LAB — COMPREHENSIVE METABOLIC PANEL
ALT: 15 U/L (ref 0–35)
AST: 16 U/L (ref 0–37)
Albumin: 3.6 g/dL (ref 3.5–5.2)
CO2: 28 mEq/L (ref 19–32)
Chloride: 110 mEq/L (ref 96–112)
Creatinine, Ser: 0.66 mg/dL (ref 0.50–1.10)
GFR calc non Af Amer: 87 mL/min — ABNORMAL LOW (ref >90)
Glucose, Bld: 351 mg/dL — ABNORMAL HIGH (ref 70–99)
Potassium: 5.5 mEq/L — ABNORMAL HIGH (ref 3.5–5.1)
Sodium: 150 mEq/L — ABNORMAL HIGH (ref 135–145)
Total Bilirubin: 0.3 mg/dL (ref 0.3–1.2)
Total Protein: 7.8 g/dL (ref 6.0–8.3)

## 2013-06-25 LAB — BLOOD GAS, ARTERIAL
Acid-Base Excess: 3.3 mmol/L — ABNORMAL HIGH (ref 0.0–2.0)
Bicarbonate: 27.2 mEq/L — ABNORMAL HIGH (ref 20.0–24.0)
O2 Saturation: 94.2 %
TCO2: 24.6 mmol/L (ref 0–100)
pO2, Arterial: 73.1 mmHg — ABNORMAL LOW (ref 80.0–100.0)

## 2013-06-25 LAB — URINE MICROSCOPIC-ADD ON

## 2013-06-25 MED ORDER — BIOTENE DRY MOUTH MT LIQD
15.0000 mL | Freq: Two times a day (BID) | OROMUCOSAL | Status: DC
  Administered 2013-06-25 – 2013-06-30 (×10): 15 mL via OROMUCOSAL

## 2013-06-25 MED ORDER — VANCOMYCIN HCL 500 MG IV SOLR
500.0000 mg | Freq: Two times a day (BID) | INTRAVENOUS | Status: DC
  Administered 2013-06-26 – 2013-06-28 (×5): 500 mg via INTRAVENOUS
  Filled 2013-06-25 (×6): qty 500

## 2013-06-25 MED ORDER — SODIUM CHLORIDE 0.9 % IV BOLUS (SEPSIS)
500.0000 mL | Freq: Once | INTRAVENOUS | Status: AC
  Administered 2013-06-25: 500 mL via INTRAVENOUS

## 2013-06-25 MED ORDER — INSULIN ASPART 100 UNIT/ML ~~LOC~~ SOLN
0.0000 [IU] | SUBCUTANEOUS | Status: DC
  Administered 2013-06-25: 3 [IU] via SUBCUTANEOUS
  Administered 2013-06-25: 5 [IU] via SUBCUTANEOUS
  Administered 2013-06-25: 11 [IU] via SUBCUTANEOUS
  Administered 2013-06-26: 5 [IU] via SUBCUTANEOUS
  Administered 2013-06-26 – 2013-06-27 (×4): 3 [IU] via SUBCUTANEOUS
  Administered 2013-06-27: 5 [IU] via SUBCUTANEOUS
  Administered 2013-06-27: 20:00:00 via SUBCUTANEOUS
  Administered 2013-06-27 (×2): 3 [IU] via SUBCUTANEOUS
  Administered 2013-06-28: 5 [IU] via SUBCUTANEOUS
  Administered 2013-06-28: 3 [IU] via SUBCUTANEOUS
  Administered 2013-06-28: 5 [IU] via SUBCUTANEOUS
  Administered 2013-06-28: 3 [IU] via SUBCUTANEOUS
  Administered 2013-06-28: 5 [IU] via SUBCUTANEOUS
  Administered 2013-06-29 (×2): 3 [IU] via SUBCUTANEOUS
  Administered 2013-06-29 (×2): 5 [IU] via SUBCUTANEOUS

## 2013-06-25 MED ORDER — PIPERACILLIN-TAZOBACTAM 3.375 G IVPB
3.3750 g | Freq: Three times a day (TID) | INTRAVENOUS | Status: DC
  Administered 2013-06-25 – 2013-06-27 (×6): 3.375 g via INTRAVENOUS
  Filled 2013-06-25 (×10): qty 50

## 2013-06-25 MED ORDER — ALBUTEROL SULFATE (5 MG/ML) 0.5% IN NEBU
2.5000 mg | INHALATION_SOLUTION | Freq: Four times a day (QID) | RESPIRATORY_TRACT | Status: DC
  Administered 2013-06-25 – 2013-06-30 (×19): 2.5 mg via RESPIRATORY_TRACT
  Filled 2013-06-25 (×19): qty 0.5

## 2013-06-25 MED ORDER — PIPERACILLIN-TAZOBACTAM 3.375 G IVPB 30 MIN
3.3750 g | Freq: Once | INTRAVENOUS | Status: AC
  Administered 2013-06-25: 3.375 g via INTRAVENOUS
  Filled 2013-06-25 (×2): qty 50

## 2013-06-25 MED ORDER — ASPIRIN 81 MG PO CHEW
81.0000 mg | CHEWABLE_TABLET | Freq: Every day | ORAL | Status: DC
  Administered 2013-06-26 – 2013-06-30 (×5): 81 mg
  Filled 2013-06-25 (×5): qty 1

## 2013-06-25 MED ORDER — PANTOPRAZOLE SODIUM 40 MG PO PACK
40.0000 mg | PACK | Freq: Every day | ORAL | Status: DC
  Administered 2013-06-26 – 2013-06-30 (×5): 40 mg
  Filled 2013-06-25 (×8): qty 20

## 2013-06-25 MED ORDER — ONDANSETRON HCL 4 MG/2ML IJ SOLN: 4.0000 mg | Freq: Four times a day (QID) | INTRAMUSCULAR | Status: DC | PRN

## 2013-06-25 MED ORDER — PRO-STAT SUGAR FREE PO LIQD
30.0000 mL | Freq: Every day | ORAL | Status: DC
  Administered 2013-06-26 – 2013-06-30 (×5): 30 mL
  Filled 2013-06-25 (×5): qty 30

## 2013-06-25 MED ORDER — FREE WATER
250.0000 mL | Status: DC
  Administered 2013-06-25 (×2): 250 mL

## 2013-06-25 MED ORDER — INSULIN GLARGINE 100 UNIT/ML ~~LOC~~ SOLN
10.0000 [IU] | Freq: Every day | SUBCUTANEOUS | Status: DC
  Administered 2013-06-25 – 2013-06-29 (×5): 10 [IU] via SUBCUTANEOUS
  Filled 2013-06-25 (×8): qty 0.1

## 2013-06-25 MED ORDER — ACETAMINOPHEN 650 MG RE SUPP: 650.0000 mg | Freq: Four times a day (QID) | RECTAL | Status: DC | PRN

## 2013-06-25 MED ORDER — CHLORHEXIDINE GLUCONATE 0.12 % MT SOLN
15.0000 mL | Freq: Two times a day (BID) | OROMUCOSAL | Status: DC
  Administered 2013-06-25 – 2013-06-30 (×10): 15 mL via OROMUCOSAL
  Filled 2013-06-25 (×10): qty 15

## 2013-06-25 MED ORDER — ALBUTEROL SULFATE (5 MG/ML) 0.5% IN NEBU
5.0000 mg | INHALATION_SOLUTION | Freq: Once | RESPIRATORY_TRACT | Status: AC
  Administered 2013-06-25: 5 mg via RESPIRATORY_TRACT
  Filled 2013-06-25: qty 1

## 2013-06-25 MED ORDER — ENOXAPARIN SODIUM 40 MG/0.4ML ~~LOC~~ SOLN
40.0000 mg | SUBCUTANEOUS | Status: DC
  Administered 2013-06-25 – 2013-06-29 (×5): 40 mg via SUBCUTANEOUS
  Filled 2013-06-25 (×5): qty 0.4

## 2013-06-25 MED ORDER — CLOPIDOGREL BISULFATE 75 MG PO TABS
75.0000 mg | ORAL_TABLET | Freq: Every day | ORAL | Status: DC
  Administered 2013-06-26 – 2013-06-30 (×5): 75 mg
  Filled 2013-06-25 (×5): qty 1

## 2013-06-25 MED ORDER — DEXTROSE-NACL 5-0.45 % IV SOLN
INTRAVENOUS | Status: DC
  Administered 2013-06-25 – 2013-06-26 (×3): via INTRAVENOUS

## 2013-06-25 MED ORDER — VANCOMYCIN HCL 1000 MG IV SOLR: 15.0000 mg/kg | Freq: Once | INTRAVENOUS | Status: DC

## 2013-06-25 MED ORDER — GUAIFENESIN-DM 100-10 MG/5ML PO SYRP: 5.0000 mL | ORAL_SOLUTION | ORAL | Status: DC | PRN

## 2013-06-25 MED ORDER — ATORVASTATIN CALCIUM 20 MG PO TABS
20.0000 mg | ORAL_TABLET | Freq: Every day | ORAL | Status: DC
  Administered 2013-06-26 – 2013-06-30 (×5): 20 mg
  Filled 2013-06-25 (×4): qty 1
  Filled 2013-06-25: qty 2

## 2013-06-25 MED ORDER — IPRATROPIUM BROMIDE 0.02 % IN SOLN
0.5000 mg | Freq: Four times a day (QID) | RESPIRATORY_TRACT | Status: DC
  Administered 2013-06-25 – 2013-06-30 (×19): 0.5 mg via RESPIRATORY_TRACT
  Filled 2013-06-25 (×19): qty 2.5

## 2013-06-25 MED ORDER — ACETAMINOPHEN 325 MG PO TABS
650.0000 mg | ORAL_TABLET | Freq: Four times a day (QID) | ORAL | Status: DC | PRN
  Administered 2013-06-25: 650 mg
  Filled 2013-06-25: qty 2

## 2013-06-25 MED ORDER — PIPERACILLIN-TAZOBACTAM 3.375 G IVPB
INTRAVENOUS | Status: AC
  Filled 2013-06-25: qty 100

## 2013-06-25 MED ORDER — ONDANSETRON HCL 4 MG PO TABS: 4.0000 mg | ORAL_TABLET | Freq: Four times a day (QID) | ORAL | Status: DC | PRN

## 2013-06-25 MED ORDER — ALBUTEROL SULFATE (5 MG/ML) 0.5% IN NEBU
2.5000 mg | INHALATION_SOLUTION | RESPIRATORY_TRACT | Status: DC | PRN
  Administered 2013-06-27: 2.5 mg via RESPIRATORY_TRACT

## 2013-06-25 MED ORDER — VANCOMYCIN HCL IN DEXTROSE 1-5 GM/200ML-% IV SOLN
1000.0000 mg | Freq: Once | INTRAVENOUS | Status: AC
  Administered 2013-06-25: 1000 mg via INTRAVENOUS
  Filled 2013-06-25: qty 200

## 2013-06-25 NOTE — H&P (Signed)
[facility] History and Physical  Christine Durham YNW:[id number] DOB: 19-Oct-1940 DOA: 06/25/2013  Referring physician: Dr. Micheline Maze, ED physician PCP: Ignatius Specking., MD   Chief Complaint: fever, shortness of breath  HPI: Christine Durham is a 72 y.o. female with a complicated past medical history including recent stroke that unfortunately left her with major neurologic deficits.  Patient has a PEG tube placed due to dysphagia and essentially nonverbal.  She has had recurrent admissions for various reasons, most appear to be related to he breathing.  Patient resides at a nursing home.  Her sister visited her last night and said that she did notice that the patient was having some labored breathing.  She asked the staff to give the patient a breathing treatment. After the neb treatment, she reports that patient was breathing much more comfortably and was back to her baseline. She reports that patient intermittently has these episodes of trouble breathing with cough, which usually resolve with neb treatments.  This morning it was reported that patient was still having trouble breathing and now had associated fevers.  She as brought to the ED where she was evaluated and found to have sepsis likely due to an underlying pneumonia.  With patient's significant history of dysphagia and PEG tube dependence, there is certainly a concern for aspiration pneumonia. There has been no reported vomiting, diarrhea, chest pain or other symptoms.   Review of Systems: Patient is nonverbal, unable to reliably assess  Past Medical History  Diagnosis Date  . Stroke 2011    with right hemiparesis and does not use her left leg  . Diabetes mellitus without complication   . Asthma   . HTN (hypertension)   . Tobacco abuse   . 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 COPD (chronic obstructive pulmonary disease)   . Dysphagia     s/p PEG tube  . Ischemic cardiomyopathy     EF 25% cath 04/2013, grade 1 DD  . Normocytic  anemia    Past Surgical History  Procedure Laterality Date  . Appendectomy    . Peg placement N/A 05/05/2013    Procedure: PERCUTANEOUS ENDOSCOPIC GASTROSTOMY (PEG) PLACEMENT;  Surgeon: Malissa Hippo, MD;  [facility];  Service: Endoscopy;  Laterality: N/A;  . Esophagogastroduodenoscopy N/A 05/05/2013    Procedure: ESOPHAGOGASTRODUODENOSCOPY (EGD);  Surgeon: Malissa Hippo, MD;  Location: [facility];  Service: Endoscopy;  Laterality: N/A;   Social History:  reports that she quit smoking about a year ago. Her smoking use included Cigarettes. She smoked 0.50 packs per day. She has quit using smokeless tobacco. She reports that she does not drink alcohol or use illicit drugs.  No Known Allergies  Family History  Problem Relation Age of Onset  . Diabetes Sister   . Hyperlipidemia Sister   . Hypertension Sister   . COPD Neg Hx   . Stroke Mother      Prior to Admission medications   Medication Sig Start Date End Date Taking? Authorizing Provider  acetaminophen (TYLENOL) 160 MG/5ML liquid Place 500 mg into feeding tube every 4 (four) hours as needed for fever or pain.   Yes Historical Provider, MD  acetaminophen (TYLENOL) 650 MG suppository Place 650 mg rectally every 4 (four) hours as needed for mild pain or fever.   Yes Historical Provider, MD  albuterol (PROVENTIL) (2.5 MG/3ML) 0.083% nebulizer solution Take 2.5 mg by nebulization every 4 (four) hours as needed for wheezing or shortness of  breath.   Yes Historical Provider, MD  Amino Acids-Protein Hydrolys (FEEDING SUPPLEMENT, PRO-STAT SUGAR FREE 64,) LIQD Place 30 mLs into feeding tube daily. 05/09/13  Yes Rodolph Bong, MD  amLODipine (NORVASC) 5 MG tablet Place 10 mg into feeding tube daily.   Yes Historical Provider, MD  aspirin 81 MG chewable tablet Chew 81 mg by mouth daily.   Yes Historical Provider, MD  atorvastatin (LIPITOR) 10 MG tablet Take 20 mg by mouth daily.   Yes Historical Provider, MD  chlorhexidine  (PERIDEX) 0.12 % solution Use as directed 15 mLs in the mouth or throat 2 (two) times daily. Please swab in mouth and suction residual.   Yes Historical Provider, MD  clopidogrel (PLAVIX) 75 MG tablet Place 75 mg into feeding tube daily with breakfast. 03/25/13  Yes Erick Blinks, MD  insulin glargine (LANTUS) 100 UNIT/ML injection Inject 10 Units into the skin at bedtime.   Yes Historical Provider, MD  metFORMIN (GLUCOPHAGE) 500 MG tablet Place 500 mg into feeding tube 2 (two) times daily with a meal. 07/08/12  Yes Daniel J Angiulli, PA-C  Multiple Vitamin (MULTIVITAMIN) LIQD Place 5 mLs into feeding tube daily. 05/09/13  Yes Rodolph Bong, MD  pantoprazole sodium (PROTONIX) 40 mg/20 mL PACK Place 20 mLs (40 mg total) into feeding tube daily. 05/09/13  Yes Rodolph Bong, MD  potassium chloride (KLOR-CON) 20 MEQ packet Place 40 mEq into feeding tube daily. 06/09/13  Yes Jerald Kief, MD  Water For Irrigation, Sterile (FREE WATER) SOLN Place 250 mLs into feeding tube every 6 (six) hours. 05/09/13  Yes Rodolph Bong, MD   Physical Exam: Filed Vitals:   06/25/13 1330  BP: 128/65  Pulse: 118  Temp:   Resp: 30    BP 128/65  Pulse 118  Temp(Src) 101.8 F (38.8 C) (Rectal)  Resp 30  SpO2 96%  General appearance: no distress Head: Normocephalic, without obvious abnormality, atraumatic Throat: abnormal findings: dry mucous membranes Neck: no adenopathy, no carotid bruit, no JVD, supple, symmetrical, trachea midline and thyroid not enlarged, symmetric, no tenderness/mass/nodules Lungs: rhonchi bilaterally Heart: S1, S2, tachycardic Abdomen: soft, non-tender; bowel sounds normal; no masses,  no organomegaly, PEG tube in place without any surrounding erythema or discharged noted. Extremities: no edema, redness or tenderness in the calves or thighs  Psychiatry: unable to assess due to mental status Neurologic.  Patient is nonverbal and does not follow commands.  Unable to fully  assess.          Labs on Admission:  Basic Metabolic Panel:  Recent Labs Lab 06/25/13 1133  NA 150*  K 5.5*  CL 110  CO2 28  GLUCOSE 351*  BUN 33*  CREATININE 0.66  CALCIUM 10.4   Liver Function Tests:  Recent Labs Lab 06/25/13 1133  AST 16  ALT 15  ALKPHOS 124*  BILITOT 0.3  PROT 7.8  ALBUMIN 3.6   No results found for this basename: LIPASE, AMYLASE,  in the last 168 hours No results found for this basename: AMMONIA,  in the last 168 hours CBC:  Recent Labs Lab 06/25/13 1133  WBC 18.9*  NEUTROABS 14.9*  HGB 12.9  HCT 41.6  MCV 94.5  PLT 378   Cardiac Enzymes: No results found for this basename: CKTOTAL, CKMB, CKMBINDEX, TROPONINI,  in the last 168 hours  BNP (last 3 results)  Recent Labs  06/26/12 2320 06/05/13 0306 06/09/13 2132  PROBNP 150.2* 94.8 84.8   CBG:  Recent Labs Lab 06/24/13  8469 06/24/13 0951 06/24/13 1554 06/24/13 2106 06/25/13 0519  GLUCAP 302* 296* 274* 208* 304*    Radiological Exams on Admission: Dg Chest Portable 1 View  06/25/2013   CLINICAL DATA:  Cough, congestion  EXAM: PORTABLE CHEST - 1 VIEW  COMPARISON:  06/09/2013  FINDINGS: Cardiac silhouette is in normal limits. Low lung volumes. Ill-defined area of increased density with linear components projecting region of the left lung base, costophrenic angle region. There is mild prominence of the interstitial markings. The osseous structures unremarkable.  IMPRESSION: Discoid atelectasis versus mild infiltrate left lower lobe. Pulmonary vascular congestion with likely underlying component pulmonary fibrosis.   Electronically Signed   By: Salome Holmes M.D.   On: 06/25/2013 11:37    EKG: Independently reviewed. Sinus tachycardia  Assessment/Plan Active Problems:   Acute respiratory failure   Diabetes mellitus without complication   COPD (chronic obstructive pulmonary disease)   Dysphagia   possible Aspiration pneumonia   Hypernatremia   Protein-calorie  malnutrition, severe   Sepsis   PEG (percutaneous endoscopic gastrostomy) status   Nonverbal   1. Sepsis likely due to pneumonia.  Will admit to step down unit.  Start on broad spectrum antibiotics and continue with IV hydration.  Patient appears to be resting more comfortably after suctioning.  Will ask respiratory to continue suctioning as appropriate. 2. Acute resp failure, currently on NRB mask, will wean down as tolearted. 3. Hypernatremia, likely due to dehydration from sepsis, will start on IV fluids and recheck. Increase free water intake from PEG tube 4. Hyperkalemia, may be due to dehydration, but review of meds indicates that she was receiving potassium supplements.  Will hold these and recheck serum potassium levels. 5. Diabetes.  Hold metformin in light of elevated lactic acid.  Continue lantus and tube feeds, will give sliding scale insulin. 6. COPD, appears stable at this time. 7. With her multiple medical problems and poor nutritional and function status, her long term prognosis is very poor.   Code Status: Limited Code.  After long discussion with patient's sister Christine Durham, she reports that she would want patient to be intubated if necessary. If patient continued to declined, she would not want the patient to undergo CPR/defibrillation. Family Communication: Discussed with sister Christine Durham over the phone Disposition Plan: eventually discharge back to SNF once she stabilizes  Time spent:  [facility] Pager [phone]

## 2013-06-25 NOTE — ED Notes (Signed)
Pt placed on NRB mask due to O2 sat dropping.

## 2013-06-25 NOTE — ED Provider Notes (Signed)
CSN: [id number]     Arrival date & time 06/25/13  1044 History  This chart was scribed for Shanna Cisco, MD by Quintella Reichert, ED scribe.  This patient was seen in room [facility]06[facility] and the patient's care was started at 11:20 AM.   Chief Complaint  Patient presents with  . Fever    Patient is a 72 y.o. female presenting with fever. The history is provided by the patient. No language interpreter was used.  Fever Max temp prior to arrival:  102.8 Temp source:  Rectal Severity:  Moderate Progression:  Partially resolved Relieved by:  Acetaminophen Associated symptoms: cough   Risk factors comment:  Recent hospitalization for pneumonia   Level 5 Caveat: Patient Nonverbal  HPI Comments: Christine Durham is a 72 y.o. female with h/o recent hospitalization for pneumonia, stroke, MI, CHF, COPD, DM, HTN, and asthma who presents to the Emergency Department complaining of a fever, cough, congestion, inc WOB.  Pt is a resident of [facility] and was brought to the ED for a fever of 102.8 F.  She was given Tylenol 650mg  suppository pta and on arrival temperature is 101.8 F.  On arrival to the ED she also appears to have labored breathing.  Respiratory therapist attempted to suction her but was not able to produce anything.  Pt was hospitalized for pneumonia last month.  She is not on antibiotics currently per nursing home staff, but recently finished a course of levaquin.  Pt is nonverbal due to CVA.     Past Medical History  Diagnosis Date  . Stroke 2011    with right hemiparesis and does not use her left leg  . Diabetes mellitus without complication   . Asthma   . HTN (hypertension)   . Tobacco abuse   . 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 COPD (chronic obstructive pulmonary disease)   . Dysphagia     s/p PEG tube  . Ischemic cardiomyopathy     EF 25% cath 04/2013, grade 1 DD  . Normocytic anemia     Past Surgical History  Procedure Laterality Date  . Appendectomy    . Peg  placement N/A 05/05/2013    Procedure: PERCUTANEOUS ENDOSCOPIC GASTROSTOMY (PEG) PLACEMENT;  Surgeon: Malissa Hippo, MD;  [facility];  Service: Endoscopy;  Laterality: N/A;  . Esophagogastroduodenoscopy N/A 05/05/2013    Procedure: ESOPHAGOGASTRODUODENOSCOPY (EGD);  Surgeon: Malissa Hippo, MD;  [facility];  Service: Endoscopy;  Laterality: N/A;    Family History  Problem Relation Age of Onset  . Diabetes Sister   . Hyperlipidemia Sister   . Hypertension Sister   . COPD Neg Hx   . Stroke Mother     History  Substance Use Topics  . Smoking status: Former Smoker -- 0.50 packs/day    Types: Cigarettes    Quit date: 06/21/2012  . Smokeless tobacco: Former Neurosurgeon  . Alcohol Use: No    OB History   Grav Para Term Preterm Abortions TAB SAB Ect Mult Living                  Review of Systems  Unable to perform ROS: Patient nonverbal  Constitutional: Positive for fever.  Respiratory: Positive for cough and shortness of breath.      Allergies  Review of patient's allergies indicates no known allergies.  Home Medications   No current outpatient prescriptions on file. BP 148/76  Pulse 125  Temp(Src) 101.8  F (38.8 C) (Rectal)  Resp 34  SpO2 97%  Physical Exam  Nursing note and vitals reviewed. Constitutional: She is oriented to person, place, and time. She appears well-developed and well-nourished. She appears ill. She appears distressed.  HENT:  Head: Normocephalic and atraumatic.  Mouth/Throat: Mucous membranes are pale and dry. No oropharyngeal exudate.  Eyes: Pupils are equal, round, and reactive to light.  Neck: Normal range of motion. Neck supple.  Cardiovascular: Regular rhythm and normal heart sounds.  Tachycardia present.  Exam reveals no gallop and no friction rub.   No murmur heard. Pulmonary/Chest: Tachypnea noted. No respiratory distress. She has no wheezes. She has rhonchi in the right lower field, the left upper field, the left  middle field and the left lower field. She has rales in the right lower field, the left upper field, the left middle field and the left lower field.  Coarse breath sounds throughout.  Upper airway secretions.  Abdominal: Soft. Bowel sounds are normal. She exhibits no distension and no mass. There is no tenderness. There is no rebound and no guarding.  PEG tube in place  Abdomen distended but soft  Musculoskeletal: Normal range of motion. She exhibits no edema and no tenderness.  Neurological: She is alert and oriented to person, place, and time. GCS eye subscore is 4. GCS verbal subscore is 1. GCS motor subscore is 1.  Will not follow commands  Skin: Skin is warm and dry.  Psychiatric: She has a normal mood and affect.    ED Course  Procedures (including critical care time)  DIAGNOSTIC STUDIES: Oxygen Saturation is 97% on [place], normal by my interpretation.    COORDINATION OF CARE: 11:30 AM-Communication limited by patient being nonverbal.  Will order albuterol nebulizer treatment, CXR, EKG and labs.   Labs Review Labs Reviewed  CBC WITH DIFFERENTIAL - Abnormal; Notable for the following:    WBC 18.9 (*)    RDW 15.7 (*)    Monocytes Relative 1 (*)    Neutro Abs 14.9 (*)    All other components within normal limits  COMPREHENSIVE METABOLIC PANEL - Abnormal; Notable for the following:    Sodium 150 (*)    Potassium 5.5 (*)    Glucose, Bld 351 (*)    BUN 33 (*)    Alkaline Phosphatase 124 (*)    GFR calc non Af Amer 87 (*)    All other components within normal limits  LACTIC ACID, PLASMA - Abnormal; Notable for the following:    Lactic Acid, Venous 3.2 (*)    All other components within normal limits  URINALYSIS, ROUTINE W REFLEX MICROSCOPIC - Abnormal; Notable for the following:    APPearance CLOUDY (*)    Glucose, UA 500 (*)    Leukocytes, UA TRACE (*)    All other components within normal limits  GLUCOSE, CAPILLARY - Abnormal; Notable for the following:     Glucose-Capillary 332 (*)    All other components within normal limits  URINE CULTURE  CULTURE, BLOOD (ROUTINE X 2)  CULTURE, BLOOD (ROUTINE X 2)  MRSA PCR SCREENING  URINE MICROSCOPIC-ADD ON  BLOOD GAS, ARTERIAL  CBC  CREATININE, SERUM    Imaging Review Dg Chest Portable 1 View  06/25/2013   CLINICAL DATA:  Cough, congestion  EXAM: PORTABLE CHEST - 1 VIEW  COMPARISON:  06/09/2013  FINDINGS: Cardiac silhouette is in normal limits. Low lung volumes. Ill-defined area of increased density with linear components projecting region of the left lung base, costophrenic angle  region. There is mild prominence of the interstitial markings. The osseous structures unremarkable.  IMPRESSION: Discoid atelectasis versus mild infiltrate left lower lobe. Pulmonary vascular congestion with likely underlying component pulmonary fibrosis.   Electronically Signed   By: Salome Holmes M.D.   On: 06/25/2013 11:37    EKG Interpretation    Date/Time:  Sunday June 25 2013 10:53:21 EST Ventricular Rate:  126 PR Interval:  122 QRS Duration: 56 QT Interval:  302 QTC Calculation: 437 R Axis:   -50 Text Interpretation:  Sinus tachycardia Right atrial enlargement Left axis deviation Abnormal ECG When compared with ECG of 09-Jun-2013 21:09, Premature supraventricular complexes are no longer Present Confirmed by DOCHERTY  MD, MEGAN ([id number]) on 06/25/2013 11:06:45 AM           CRITICAL CARE Performed by: Toy Cookey, E Total critical care time: 35 Critical care time was exclusive of separately billable procedures and treating other patients. Critical care was necessary to treat or prevent imminent or life-threatening deterioration. Critical care was time spent personally by me on the following activities: development of treatment plan with patient and/or surrogate as well as nursing, discussions with consultants, evaluation of patient's response to treatment, examination of patient, obtaining history from  patient or surrogate, ordering and performing treatments and interventions, ordering and review of laboratory studies, ordering and review of radiographic studies, pulse oximetry and re-evaluation of patient's condition.    MDM   1. Sepsis   2. Acute respiratory failure   3. Aspiration pneumonia   4. Hypernatremia    Pt is a 72 y.o. female with Pmhx as above who presents with fever, cough, congestion, inc WOB,  RR >32, tachycardia >120, and PE findings concerning for dehydration.  Pt initially placed on [place], then transitioned to NRB due to low O2 sats.  Albuterol neb given with minimal improvement.  CXR concerning for pna.  Labs show elevated LA, WBC 18.9, Na 150, K 5.5 (no hyperacute T waves), elevated BUN.  Vanc, zosyn, 1L NS given for HCAP.  Hospitalist consulted for admission, pt will go to [facility] unit for sepsis, ARF, HCAP.  I had discussion w/ pt's sister who is HCPOA, states she is full code, but if she were "brain dead" they would then want to withdraw care.  Specifically, pt would want intubation, CPR.       I personally performed the services described in this documentation, which was scribed in my presence. The recorded information has been reviewed and is accurate.     Shanna Cisco, MD 06/25/13 1537

## 2013-06-25 NOTE — ED Notes (Signed)
Pt given 650 of tylenol suppository prior to arrival via [facility] at 9:40am.

## 2013-06-25 NOTE — ED Notes (Signed)
Pt comes from [facility] with c/o fever, chest congestion. Staff states pt had temp of 102.8 today. Pt was recently tx for pneumonia. Pt is alert but not verbally responsive, unable to give hx.

## 2013-06-25 NOTE — ED Notes (Signed)
Respiratory arrived at bedside for deep suctioning.

## 2013-06-25 NOTE — Progress Notes (Signed)
ANTIBIOTIC CONSULT NOTE  Pharmacy Consult for Vancomycin and Zosyn  Indication: pneumonia and rule out sepsis  No Known Allergies  Patient Measurements: Height: 5' 6.93" (170 cm) Weight: 119 lb 0.8 oz (54 kg) IBW/kg (Calculated) : 61.44  Vital Signs: Temp: 101.8 F (38.8 C) (12/14 1100) Temp src: Rectal (12/14 1100) BP: 132/63 mmHg (12/14 1430) Pulse Rate: 109 (12/14 1430) Intake/Output from previous day:   Intake/Output from this shift:    Labs:  Recent Labs  06/25/13 1133  WBC 18.9*  HGB 12.9  PLT 378  CREATININE 0.66   Estimated Creatinine Clearance: 55.8 ml/min (by C-G formula based on Cr of 0.66). No results found for this basename: Rolm Gala, VANCORANDOM, GENTTROUGH, GENTPEAK, GENTRANDOM, TOBRATROUGH, TOBRAPEAK, TOBRARND, AMIKACINPEAK, AMIKACINTROU, AMIKACIN,  in the last 72 hours   Microbiology: Recent Results (from the past 720 hour(s))  URINE CULTURE     Status: None   Collection Time    06/05/13  3:34 AM      Result Value Range Status   Specimen Description URINE, CATHETERIZED   Final   Special Requests NONE   Final   Culture  Setup Time     Final   Value: 06/05/2013 15:01     Performed at [facility] Count     Final   Value: >=100,000 COLONIES/ML     Performed at [facility]   Culture     Final   Value: YEAST     Performed at [facility]   Report Status 06/06/2013 FINAL   Final  MRSA PCR SCREENING     Status: None   Collection Time    06/05/13  7:46 AM      Result Value Range Status   MRSA by PCR NEGATIVE  NEGATIVE Final   Comment:            The GeneXpert MRSA Assay (FDA     approved for NASAL specimens     only), is one component of a     comprehensive MRSA colonization     surveillance program. It is not     intended to diagnose MRSA     infection nor to guide or     monitor treatment for     MRSA infections.  CULTURE, BLOOD (ROUTINE X 2)     Status: None   Collection Time   06/09/13  9:32 PM      Result Value Range Status   Specimen Description BLOOD RIGHT HAND   Final   Special Requests BOTTLES DRAWN AEROBIC AND ANAEROBIC 6CC EACH   Final   Culture NO GROWTH 5 DAYS   Final   Report Status 06/14/2013 FINAL   Final  CULTURE, BLOOD (ROUTINE X 2)     Status: None   Collection Time    06/09/13  9:38 PM      Result Value Range Status   Specimen Description BLOOD LEFT HAND   Final   Special Requests BOTTLES DRAWN AEROBIC AND ANAEROBIC [facility] EACH   Final   Culture NO GROWTH 5 DAYS   Final   Report Status 06/14/2013 FINAL   Final  URINE CULTURE     Status: None   Collection Time    06/09/13 10:33 PM      Result Value Range Status   Specimen Description URINE, CATHETERIZED   Final   Special Requests NONE   Final   Culture  Setup Time     Final   Value:  06/10/2013 19:16     Performed at [facility] Count     Final   Value: 55,000 COLONIES/ML     Performed at [facility]   Culture     Final   Value: YEAST     Performed at [facility]   Report Status 06/11/2013 FINAL   Final    Medical History: Past Medical History  Diagnosis Date  . Stroke 2011    with right hemiparesis and does not use her left leg  . Diabetes mellitus without complication   . Asthma   . HTN (hypertension)   . Tobacco abuse   . 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 COPD (chronic obstructive pulmonary disease)   . Dysphagia     s/p PEG tube  . Ischemic cardiomyopathy     EF 25% cath 04/2013, grade 1 DD  . Normocytic anemia     Medications:  Scheduled:  . vancomycin  1,000 mg Intravenous Once   Assessment: Okay for Protocol Patient being admitted for possible aspiration PNA, Sepsis.  Received initial doses in ED.  Vancomycin 12/14 >> Zosyn 12/14 >>  Goal of Therapy:  Vancomycin trough level 15-20 mcg/ml  Plan:  Zosyn 3.375gm IV every 8 hours. Follow-up micro data, labs, vitals. Vancomycin 500mg  IV every 12 hours. Measure antibiotic  drug levels at steady state Follow up culture results  Mady Gemma 06/25/2013,3:02 PM

## 2013-06-25 NOTE — Progress Notes (Addendum)
Patient ID: Christine Durham, female   DOB: [birth year], 72 y.o.   MRN: [id number]           HISTORY & PHYSICAL  DATE:  06/12/2013  FACILITY: [facility]    LEVEL OF CARE:   SNF   CHIEF COMPLAINT:  Review after admission to [facility], 06/05/2013 through 06/08/2013.    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  She had a prior right hemisphere infarct and was admitted to hospital early in October showing an extension of a previous left hemisphere infarct.  She is now PEG tube dependent.   On this occasion, she was admitted with a COPD exacerbation.  She also has severe ischemic cardiomyopathy with an ejection fraction of 25% via catheterization in October 2014.  On the day of admission, she became increasingly short of breath.  She had a temperature of 100.  In the emergency department, she received BiPAP and nebulizers.  Her initial blood gases on BiPAP 15/5 showed a pH of 7.38, pCO2 of 50, and a pO2 of 359.  Her chest x-ray showed stable scarring, but no infiltrate or pulmonary edema.  Her white count was elevated at 13.    The patient was treated with Solu-Medrol at 60 IV q.12, prednisone taper.  She was continued on budesonide and DuoNebs.  She was also felt to have possible aspiration pneumonia, treated with vancomycin and Zosyn, transitioned to oral antibiotics.     PAST MEDICAL HISTORY/PROBLEM LIST:  Severe neurologic disability secondary to bi-hemispheric strokes.   Continuing on PEG tube feeding.    COPD.    Severe ischemic cardiomyopathy, as noted above.    Type 2 diabetes.  Hemoglobin A1c was 8.    Normocytic anemia with a hemoglobin "trending down".    CURRENT MEDICATIONS:  Discharge medications include:    Tylenol liquid 500 q.4 p.r.n.     Albuterol nebulizers 2.5 q.4 p.r.n.    Amlodipine 10 mg daily.    ASA 81 q.d.    Lipitor 20 q.d.    Plavix 75 q.d.    Free water 250 via tube q.6.     Lantus insulin 10 U at bedtime.    Levaquin 500 mg daily.    Metformin 500 b.i.d.    Protonix 20 mL daily.    Potassium 40 mEq daily.    Prednisone 5 mg daily.    SOCIAL HISTORY: CODE STATUS:  The patient remains a Full Code.    REVIEW OF SYSTEMS:  Not possible from the patient.  However, the staff state she looks better than before she went out.    PHYSICAL EXAMINATION:   VITAL SIGNS:   O2 SATURATIONS:  96% on 2 L.   PULSE:  100.   RESPIRATIONS:  20.   CHEST/RESPIRATORY:  Surprisingly clear air entry bilaterally.  Slightly shallow.  Perhaps mild accessory muscle use, but she is not struggling to breathe.     CARDIOVASCULAR:  CARDIAC:   Heart sounds are distant.  There are no murmurs.  No gallops.  JVP is not elevated.   GASTROINTESTINAL:  ABDOMEN:   PEG site looks fine.  There is no tenderness.   CIRCULATION:   EDEMA/VARICOSITIES:  Extremities:  No edema.   NEUROLOGICAL:   Status is much the same.    ASSESSMENT/PLAN:  COPD, acute.  Felt to be aggravated by aspiration pneumonia, requiring BiPAP on presentation.  She appears more stable now.  Discharged on  Levaquin and prednisone.  I am uncertain if she was on prednisone chronically before this.    Severe, presumably ischemic cardiomyopathy.  She is not on any cardiac medications at the moment other than Plavix, aspirin 81 mg, and Norvasc.  She does not appear to require any diuretics currently.  There is no evidence of CHF.    Type 2 diabetes.  On a small dose of Lantus at 10 U at bedtime.  She also has a sliding scale.    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  Everything appears to be stable.  She is PEG tube dependent.    Hyperlipidemia.  On Lipitor 20.    Gastroesophageal reflux.  On Protonix.    The patient appears to be stable.    Lab work from 06/09/2013 showed a white count of 14.8.  This may be steroid-induced.  Basic metabolic panel was normal.    I see no reason to change  her current treatment at present.

## 2013-06-26 ENCOUNTER — Inpatient Hospital Stay (HOSPITAL_COMMUNITY): Payer: Medicare Other

## 2013-06-26 DIAGNOSIS — E875 Hyperkalemia: Secondary | ICD-10-CM | POA: Diagnosis present

## 2013-06-26 DIAGNOSIS — I428 Other cardiomyopathies: Secondary | ICD-10-CM

## 2013-06-26 DIAGNOSIS — E876 Hypokalemia: Secondary | ICD-10-CM

## 2013-06-26 DIAGNOSIS — B3749 Other urogenital candidiasis: Secondary | ICD-10-CM

## 2013-06-26 LAB — CBC
HCT: 32.5 % — ABNORMAL LOW (ref 36.0–46.0)
MCHC: 30.5 g/dL (ref 30.0–36.0)
MCV: 94.8 fL (ref 78.0–100.0)
Platelets: 297 10*3/uL (ref 150–400)
RDW: 15.6 % — ABNORMAL HIGH (ref 11.5–15.5)
WBC: 20.3 10*3/uL — ABNORMAL HIGH (ref 4.0–10.5)

## 2013-06-26 LAB — GLUCOSE, CAPILLARY
Glucose-Capillary: 356 mg/dL — ABNORMAL HIGH (ref 70–99)
Glucose-Capillary: 167 mg/dL — ABNORMAL HIGH (ref 70–99)
Glucose-Capillary: 226 mg/dL — ABNORMAL HIGH (ref 70–99)
Glucose-Capillary: 68 mg/dL — ABNORMAL LOW (ref 70–99)
Glucose-Capillary: 161 mg/dL — ABNORMAL HIGH (ref 70–99)
Glucose-Capillary: 194 mg/dL — ABNORMAL HIGH (ref 70–99)

## 2013-06-26 LAB — BASIC METABOLIC PANEL
BUN: 21 mg/dL (ref 6–23)
CO2: 28 mEq/L (ref 19–32)
CO2: 25 mEq/L (ref 19–32)
Calcium: 8.9 mg/dL (ref 8.4–10.5)
Chloride: 110 mEq/L (ref 96–112)
Chloride: 110 mEq/L (ref 96–112)
Creatinine, Ser: 0.45 mg/dL — ABNORMAL LOW (ref 0.50–1.10)
Creatinine, Ser: 0.46 mg/dL — ABNORMAL LOW (ref 0.50–1.10)
GFR calc Af Amer: >90 mL/min (ref >90)
GFR calc non Af Amer: >90 mL/min (ref >90)
Glucose, Bld: 222 mg/dL — ABNORMAL HIGH (ref 70–99)
Sodium: 146 mEq/L — ABNORMAL HIGH (ref 135–145)
Sodium: 145 mEq/L (ref 135–145)

## 2013-06-26 LAB — URINE CULTURE: Colony Count: >=100000

## 2013-06-26 LAB — CLOSTRIDIUM DIFFICILE BY PCR: Toxigenic C. Difficile by PCR: NEGATIVE

## 2013-06-26 LAB — MAGNESIUM: Magnesium: 2 mg/dL (ref 1.5–2.5)

## 2013-06-26 MED ORDER — FREE WATER
250.0000 mL | Status: DC
  Administered 2013-06-26 – 2013-06-27 (×9): 250 mL

## 2013-06-26 MED ORDER — POTASSIUM CHLORIDE CRYS ER 20 MEQ PO TBCR
20.0000 meq | EXTENDED_RELEASE_TABLET | Freq: Two times a day (BID) | ORAL | Status: AC
  Administered 2013-06-26: 20 meq via ORAL
  Filled 2013-06-26: qty 1

## 2013-06-26 MED ORDER — SODIUM CHLORIDE 0.45 % IV SOLN
INTRAVENOUS | Status: DC
  Administered 2013-06-26: 11:00:00 via INTRAVENOUS

## 2013-06-26 MED ORDER — DEXTROSE 50 % IV SOLN
INTRAVENOUS | Status: AC
  Filled 2013-06-26: qty 50

## 2013-06-26 MED ORDER — GLUCERNA 1.2 CAL PO LIQD
1000.0000 mL | ORAL | Status: DC
  Administered 2013-06-26 – 2013-06-30 (×5): 1000 mL
  Filled 2013-06-26 (×10): qty 1000

## 2013-06-26 MED ORDER — FLUCONAZOLE 100MG IVPB
100.0000 mg | INTRAVENOUS | Status: DC
  Administered 2013-06-26 – 2013-06-28 (×3): 100 mg via INTRAVENOUS
  Filled 2013-06-26 (×5): qty 50

## 2013-06-26 MED ORDER — DEXTROSE 50 % IV SOLN
25.0000 mL | Freq: Once | INTRAVENOUS | Status: AC | PRN
  Administered 2013-06-26: 25 mL via INTRAVENOUS

## 2013-06-26 NOTE — Progress Notes (Signed)
[facility] PROGRESS NOTE  Christine Durham WJX:[id number] DOB: March 03, 1941 DOA: 06/25/2013 PCP: Ignatius Specking., MD    Code Status: Partial code Family Communication: discussed with the patient's niece, Christine Durham Disposition Plan: eventually discharged back to the skilled nursing facility when clinically appropriate.   Consultants:  None  Procedures:  None  Antibiotics:  Fluconazole 06/26/2013  IV vancomycin 06/25/2014  Zosyn 06/25/2014  HPI/Subjective: The patient is lying in bed. Her eyes are open. 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 Her niece, Christine Durham is in the room. She says that the patient looks much better and she is breathing better.  Objective: Filed Vitals:   06/26/13 0722  BP:   Pulse:   Temp: 98.1 F (36.7 C)  Resp:    MAXIMUM TEMPERATURE 101.8. T. current 98.1. Pulse 83. Respiratory rate 24. Blood pressure 101/50. Oxygen saturation 100% on supplemental oxygen.   Intake/Output Summary (Last 24 hours) at 06/26/13 0942 Last data filed at 06/26/13 0902  Gross per 24 hour  Intake 2523.33 ml  Output      0 ml  Net 2523.33 ml   Filed Weights   06/25/13 1430 06/25/13 1516 06/26/13 0500  Weight: 54 kg (119 lb 0.8 oz) 52 kg (114 lb 10.2 oz) 56.3 kg (124 lb 1.9 oz)    Exam:   General:  Debilitated-appearing 72 year old woman lying in bed, in no acute distress.  Cardiovascular: S1, S2, with a soft systolic murmur.  Respiratory: Clear anteriorly with a rare crackles auscultated in the lower lobes.  Abdomen: Hypoactive bowel sounds. PEG tube in place without significant surrounding erythema. Soft, nontender, nondistended.  Musculoskeletal/extremities: No pedal edema. Pedal pulses barely palpable. No acute hot joints.  Neurologic: She is alert and appears to track with her eyes. She does not lift her legs or her arms with commands. She is chronically mute. She has a right facial droop.  Data Reviewed: Basic Metabolic Panel:  Recent  Labs Lab 06/25/13 1133 06/26/13 0436 06/26/13 0804  NA 150* 146* 145  K 5.5* 2.8* 3.1*  CL 110 110 110  CO2 28 28 25   GLUCOSE 351* 138* 222*  BUN 33* 21 19  CREATININE 0.66 0.45* 0.46*  CALCIUM 10.4 8.9 8.8  MG  --   --  2.0   Liver Function Tests:  Recent Labs Lab 06/25/13 1133  AST 16  ALT 15  ALKPHOS 124*  BILITOT 0.3  PROT 7.8  ALBUMIN 3.6   No results found for this basename: LIPASE, AMYLASE,  in the last 168 hours No results found for this basename: AMMONIA,  in the last 168 hours CBC:  Recent Labs Lab 06/25/13 1133 06/26/13 0436  WBC 18.9* 20.3*  NEUTROABS 14.9*  --   HGB 12.9 9.9*  HCT 41.6 32.5*  MCV 94.5 94.8  PLT 378 297   Cardiac Enzymes: No results found for this basename: CKTOTAL, CKMB, CKMBINDEX, TROPONINI,  in the last 168 hours BNP (last 3 results)  Recent Labs  06/26/12 2320 06/05/13 0306 06/09/13 2132  PROBNP 150.2* 94.8 84.8   CBG:  Recent Labs Lab 06/25/13 1521 06/25/13 1955 06/25/13 2316 06/26/13 0341 06/26/13 0728  GLUCAP 332* 230* 179* 109* 167*    Recent Results (from the past 240 hour(s))  MRSA PCR SCREENING     Status: None   Collection Time    06/25/13  8:31 PM      Result Value Range Status   MRSA by PCR NEGATIVE  NEGATIVE Final   Comment:  The GeneXpert MRSA Assay (FDA     approved for NASAL specimens     only), is one component of a     comprehensive MRSA colonization     surveillance program. It is not     intended to diagnose MRSA     infection nor to guide or     monitor treatment for     MRSA infections.     Studies: Portable Chest 1 View  06/26/2013   CLINICAL DATA:  Pneumonia.  Acute respiratory failure.  EXAM: PORTABLE CHEST - 1 VIEW  COMPARISON:  06/25/2013 and 06/10/1999  FINDINGS: There is new slight right base atelectasis. Slight discoid atelectasis at the left base is unchanged. Heart size and pulmonary vascularity normal. No acute osseous abnormality.  IMPRESSION: New slight  atelectasis at the right base.   Electronically Signed   By: Geanie Cooley M.D.   On: 06/26/2013 08:02   Dg Chest Portable 1 View  06/25/2013   CLINICAL DATA:  Cough, congestion  EXAM: PORTABLE CHEST - 1 VIEW  COMPARISON:  06/09/2013  FINDINGS: Cardiac silhouette is in normal limits. Low lung volumes. Ill-defined area of increased density with linear components projecting region of the left lung base, costophrenic angle region. There is mild prominence of the interstitial markings. The osseous structures unremarkable.  IMPRESSION: Discoid atelectasis versus mild infiltrate left lower lobe. Pulmonary vascular congestion with likely underlying component pulmonary fibrosis.   Electronically Signed   By: Salome Holmes M.D.   On: 06/25/2013 11:37    Scheduled Meds: . albuterol  2.5 mg Nebulization Q6H  . antiseptic oral rinse  15 mL Mouth Rinse q12n4p  . aspirin  81 mg Per Tube Daily  . atorvastatin  20 mg Per Tube Daily  . chlorhexidine  15 mL Mouth Rinse BID  . clopidogrel  75 mg Per Tube Q breakfast  . enoxaparin (LOVENOX) injection  40 mg Subcutaneous Q24H  . feeding supplement (PRO-STAT SUGAR FREE 64)  30 mL Per Tube Daily  . free water  250 mL Per Tube Q4H  . insulin aspart  0-15 Units Subcutaneous Q4H  . insulin glargine  10 Units Subcutaneous QHS  . ipratropium  0.5 mg Nebulization Q6H  . pantoprazole sodium  40 mg Per Tube Daily  . piperacillin-tazobactam (ZOSYN)  IV  3.375 g Intravenous Q8H  . vancomycin  500 mg Intravenous Q12H   Continuous Infusions: . dextrose 5 % and 0.45% NaCl 100 mL/hr at 06/26/13 0007   Assessment:  Principal Problem:   Acute respiratory failure Active Problems:   NICM (nonischemic cardiomyopathy)   COPD (chronic obstructive pulmonary disease)   possible Aspiration pneumonia   Sepsis   Dysphagia   Candida UTI   Diabetes mellitus without complication   Hypernatremia   Protein-calorie malnutrition, severe   PEG (percutaneous endoscopic gastrostomy)  status   Nonverbal   Hyperkalemia   Hypokalemia   1. Acute respiratory failure with hypoxia, presumed to be secondary to aspiration pneumonia. She is oxygenating much better. We'll continue supplemental oxygen as needed and antibiotics.  Sepsis/lactic acidosis. Likely secondary to aspiration pneumonia and possibly Candida UTI.  Candidal UTI. We'll start fluconazole.  COPD. No audible wheezing per my exam this morning. We'll continue oxygen and albuterol/Atrovent nebulizers.  Nonischemic cardiomyopathy with ejection fraction of 25%. This appears to be compensated and stable. We'll continue aspirin, Plavix, and statin. I'm not sure why she is not on a ACE inhibitor chronically. But will hold ACE inhibitor now due to previous  hyperkalemia and low-normal blood pressures.  Hypertension. She is treated with Norvasc chronically. This is being held due to relative hypotension.  Diabetes mellitus. We'll continue insulin therapy and adjust as needed for improved glycemic control. Will likely discontinue dextrose and IV fluids when PEG tube feedings have been restarted.  Hyperkalemia. Treated. Now hypokalemia. Will supplement cautiously.  Hypernatremia/dehydration. This is likely secondary to volume contraction from PEG tube feedings. Now resolved on half-normal saline and free water via PEG tube.  Chronic anemia. Dilutional in part.  History of stroke with aphasia, dysphagia, and hemiparesis. Continue aspirin and Plavix via PEG tube.     Plan: 1. We'll start Diflucan. 2. Restart tube feedings per registered dietitian recommendations. 3. Decrease IV fluid hydration and discontinue dextrose. 4. Supplement potassium cautiously. 5. Transfer to telemetry.  Time spent: 40 minutes.    [facility]  [facility] Pager [phone]. If 7PM-7AM, please contact night-coverage at [email or web address], password [facility] 06/26/2013, 9:42 AM  LOS: 1 day

## 2013-06-26 NOTE — Progress Notes (Signed)
INITIAL NUTRITION ASSESSMENT  DOCUMENTATION CODES Per approved criteria  -Severe malnutrition in the context of chronic illness   INTERVENTION:  Initiate continuous Glucerna 1.2  @ 55 ml/hr via PEG with 30 ml Prostat daily.  At goal rate, tube feeding regimen will provide 1684 kcal, 94 grams of protein, and  1062 ml of H2O.   Free water 250 q 4 r via PEG  IVF-NS @ 50 ml/hr = 1200 ml q 24 hr  Adult Enteral Nutrition Protocol implemented  NUTRITION DIAGNOSIS: Malnutrition; ongoing  Goal: Pt to meet >/= 90% of their estimated nutrition needs   Monitor:  EN tolerance, labs, changes in status  Reason for Assessment: Enteral nutrition initiation/mangement   72 y.o. female  Admitting Dx: Acute respiratory failure  ASSESSMENT: Pt from [facility] PEG placed  05/05/13. She is aphasic. Hx of malnutrition related to CVA and resulting severe dysphagia. Hx of COPD and acute respiratory failure. Recent (12/11) increased WBC's 17, diarrhea with a negative c.diff culture.  She has been tolerating Glucerna 1.2 @ 55 ml/hr with 0 ml residuals.   Height: Ht Readings from Last 1 Encounters:  06/25/13 5\' 6"  (1.676 m)    Weight: Wt Readings from Last 1 Encounters:  06/26/13 124 lb 1.9 oz (56.3 kg)    Ideal Body Weight: 130# (59 kg)  % Ideal Body Weight: 95%  Wt Readings from Last 10 Encounters:  06/26/13 124 lb 1.9 oz (56.3 kg)  06/09/13 119 lb (53.978 kg)  06/08/13 119 lb 0.8 oz (54 kg)  05/09/13 119 lb 7.8 oz (54.2 kg)  05/09/13 119 lb 7.8 oz (54.2 kg)  04/12/13 123 lb 10.9 oz (56.1 kg)  04/10/13 133 lb (60.328 kg)  04/10/13 133 lb (60.328 kg)  03/29/13 128 lb (58.06 kg)  03/25/13 128 lb 14.4 oz (58.469 kg)    Usual Body Weight: 130-133#  % Usual Body Weight: 95%  BMI:  Body mass index is 20.04 kg/(m^2).normal range  Estimated Nutritional Needs: Kcal: 1500-1800 Protein: 73-95 gr Fluid: 1800 ml daily  Skin: intact  Diet Order: NPO  EDUCATION NEEDS: -Education needs  addressed   Intake/Output Summary (Last 24 hours) at 06/26/13 1154 Last data filed at 06/26/13 0943  Gross per 24 hour  Intake 2713.33 ml  Output      0 ml  Net 2713.33 ml    Last BM: 06/26/13  Labs:   Recent Labs Lab 06/25/13 1133 06/26/13 0436 06/26/13 0804  NA 150* 146* 145  K 5.5* 2.8* 3.1*  CL 110 110 110  CO2 28 28 25   BUN 33* 21 19  CREATININE 0.66 0.45* 0.46*  CALCIUM 10.4 8.9 8.8  MG  --   --  2.0  GLUCOSE 351* 138* 222*    CBG (last 3)   Recent Labs  06/25/13 2316 06/26/13 0341 06/26/13 0728  GLUCAP 179* 109* 167*    Scheduled Meds: . albuterol  2.5 mg Nebulization Q6H  . antiseptic oral rinse  15 mL Mouth Rinse q12n4p  . aspirin  81 mg Per Tube Daily  . atorvastatin  20 mg Per Tube Daily  . chlorhexidine  15 mL Mouth Rinse BID  . clopidogrel  75 mg Per Tube Q breakfast  . enoxaparin (LOVENOX) injection  40 mg Subcutaneous Q24H  . feeding supplement (PRO-STAT SUGAR FREE 64)  30 mL Per Tube Daily  . fluconazole (DIFLUCAN) IV  100 mg Intravenous Q24H  . free water  250 mL Per Tube Q4H  . insulin aspart  0-15 Units  Subcutaneous Q4H  . insulin glargine  10 Units Subcutaneous QHS  . ipratropium  0.5 mg Nebulization Q6H  . pantoprazole sodium  40 mg Per Tube Daily  . piperacillin-tazobactam (ZOSYN)  IV  3.375 g Intravenous Q8H  . potassium chloride  20 mEq Oral BID  . vancomycin  500 mg Intravenous Q12H    Continuous Infusions: . sodium chloride 50 mL/hr at 06/26/13 1115    Past Medical History  Diagnosis Date  . Stroke 2011    with right hemiparesis and does not use her left leg  . Diabetes mellitus without complication   . Asthma   . HTN (hypertension)   . Tobacco abuse   . 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 COPD (chronic obstructive pulmonary disease)   . Dysphagia     s/p PEG tube  . Ischemic cardiomyopathy     EF 25% cath 04/2013, grade 1 DD  . Normocytic anemia     Past Surgical History  Procedure Laterality Date  .  Appendectomy    . Peg placement N/A 05/05/2013    Procedure: PERCUTANEOUS ENDOSCOPIC GASTROSTOMY (PEG) PLACEMENT;  Surgeon: Malissa Hippo, MD;  Location: [facility];  Service: Endoscopy;  Laterality: N/A;  . Esophagogastroduodenoscopy N/A 05/05/2013    Procedure: ESOPHAGOGASTRODUODENOSCOPY (EGD);  Surgeon: Malissa Hippo, MD;  Location: [facility];  Service: Endoscopy;  Laterality: N/A;    Royann Shivers MS,RD,CSG,LDN Office: [phone] Pager: [phone]

## 2013-06-26 NOTE — Progress Notes (Signed)
UR chart review completed.  

## 2013-06-26 NOTE — Progress Notes (Signed)
PT aspirates her secretions, she cannot clear any and needs good oral hygiene and possible occasional ntsx to keep her airway clear. This is not being done at the facility she is at. This is evident by the sores in her mouth. She may need to go where upper airway management can be performed. Hopefully this can be looked into.

## 2013-06-26 NOTE — Care Management Note (Addendum)
    Page 1 of 1   2013/07/30     1:21:13 PM   CARE MANAGEMENT NOTE 07/30/2013  Patient:  Christine Durham, Christine Durham   Account Number:  [id number]  Date Initiated:  06/26/2013  Documentation initiated by:  Sharrie Rothman  Subjective/Objective Assessment:   Pt admitted from [facility] center with sepsis and pneumonia. Pt will return to facility at discharge.     Action/Plan:   CSW to arrange discharge to facility when medically stable.   Anticipated DC Date:  30-Jul-2013   Anticipated DC Plan:  SKILLED NURSING FACILITY  In-house referral  Clinical Social Worker      DC Planning Services  CM consult      Choice offered to / List presented to:             Status of service:  Completed, signed off Medicare Important Message given?  YES (If response is "NO", the following Medicare IM given date fields will be blank) Date Medicare IM given:  07/30/13 Date Additional Medicare IM given:    Discharge Disposition:  SKILLED NURSING FACILITY  Per UR Regulation:    If discussed at Long Length of Stay Meetings, dates discussed:    Comments:  07-30-13 1320 Arlyss Queen, RN BSN CM Pt discharged to [facility] today. CSw to arrange discharge to facility.  06/26/13 1520 Arlyss Queen, RN BSN CM

## 2013-06-26 NOTE — Progress Notes (Signed)
Pt transferred to 329 per Dr. Sherrie Mustache. Pt's POA, niece Zella Ball, made aware via telephone. Verbalized understanding. Report given to Misty Stanley, Charity fundraiser. Verbalized understanding. IV sites patent and WNL at time of transfer. Pt alert. Pt transferred via bed in stable condition accompanied by RN and NT.

## 2013-06-26 NOTE — Clinical Social Work Psychosocial (Signed)
    Clinical Social Work Department BRIEF PSYCHOSOCIAL ASSESSMENT 06/26/2013  Patient:  Christine Durham, Christine Durham     Account Number:  [id number]     Admit date:  06/25/2013  Clinical Social Worker:  Santa Genera, CLINICAL SOCIAL WORKER  Date/Time:  06/26/2013 10:00 AM  Referred by:  Physician  Date Referred:  06/26/2013 Referred for  SNF Placement   Other Referral:   Interview type:  Family Other interview type:   Spoke w sister, Christine Durham    PSYCHOSOCIAL DATA Living Status:  FACILITY Admitted from facility:  [facility] Level of care:  Skilled Nursing Facility Primary support name:  Christine Webb-Peerson Primary support relationship to patient:  SIBLING Degree of support available:   Supportive and involved family    CURRENT CONCERNS Current Concerns  Post-Acute Placement   Other Concerns:    SOCIAL WORK ASSESSMENT / PLAN CSW could not assess patient directly, patient oriented to person only.  Spoke w sister, Christine Durham, who stated that family is very happy w care patient is receiving at [facility] and wants her to return at discharge.  Patient is still in Medicare rehab days, family is now paying copays.  Family is paying copays monthly, says they will be speaking w [facility] admissions today.    RN had noted patient's mouth condition, expressed concern about oral care received.  CSW spoke w both sister and Psychologist, occupational.  Per sister, patient bites on tongue and lip when struggling w breathing issues.  This results in dried blood on tongue, teeth.  Sister says facility performs oral care q 2 H and is trying to address issue.  Spoke w [facility] staff who confirmed that SNF staff is aware of mouth issues and are addressing.  Have added mouthwash to medications regimen.    [facility] is willing to take patient [facility] at discharge, CSW will keep facility and family informed as needed.   Assessment/plan status:  Psychosocial Support/Ongoing Assessment of Needs Other  assessment/ plan:   Information/referral to community resources:   Return to [facility] when ready    PATIENT'S/FAMILY'S RESPONSE TO PLAN OF CARE: Family agreeable to patient return to [facility] at discharge.      Santa Genera, LCSW Clinical Social Worker [phone])

## 2013-06-26 NOTE — Progress Notes (Signed)
Hypoglycemic Event  CBG: 68  Treatment: D50 IV 25 mL  Symptoms: None  Follow-up CBG: Time: CBG Result:  Possible Reasons for Event: Inadequate meal intake  Comments/MD notified: Karl Luke, RN receiving patient to recheck CBG at supper time.    Erinn Mendosa, Joyice Faster  Remember to initiate Hypoglycemia Order Set & complete

## 2013-06-27 ENCOUNTER — Encounter (HOSPITAL_COMMUNITY): Payer: Self-pay | Admitting: Internal Medicine

## 2013-06-27 DIAGNOSIS — A0472 Enterocolitis due to Clostridium difficile, not specified as recurrent: Secondary | ICD-10-CM

## 2013-06-27 DIAGNOSIS — R197 Diarrhea, unspecified: Secondary | ICD-10-CM | POA: Diagnosis not present

## 2013-06-27 HISTORY — DX: Enterocolitis due to Clostridium difficile, not specified as recurrent: A04.72

## 2013-06-27 LAB — CBC
MCHC: 31.4 g/dL (ref 30.0–36.0)
Platelets: 249 10*3/uL (ref 150–400)
RDW: 14.9 % (ref 11.5–15.5)
WBC: 14.2 10*3/uL — ABNORMAL HIGH (ref 4.0–10.5)

## 2013-06-27 LAB — GLUCOSE, CAPILLARY
Glucose-Capillary: 158 mg/dL — ABNORMAL HIGH (ref 70–99)
Glucose-Capillary: 178 mg/dL — ABNORMAL HIGH (ref 70–99)
Glucose-Capillary: 231 mg/dL — ABNORMAL HIGH (ref 70–99)

## 2013-06-27 LAB — BASIC METABOLIC PANEL
Calcium: 8.7 mg/dL (ref 8.4–10.5)
Creatinine, Ser: 0.47 mg/dL — ABNORMAL LOW (ref 0.50–1.10)
GFR calc Af Amer: >90 mL/min (ref >90)
GFR calc non Af Amer: >90 mL/min (ref >90)
Glucose, Bld: 205 mg/dL — ABNORMAL HIGH (ref 70–99)
Sodium: 140 mEq/L (ref 135–145)

## 2013-06-27 MED ORDER — LEVOFLOXACIN IN D5W 750 MG/150ML IV SOLN
750.0000 mg | INTRAVENOUS | Status: DC
  Administered 2013-06-27 – 2013-06-28 (×2): 750 mg via INTRAVENOUS
  Filled 2013-06-27 (×3): qty 150

## 2013-06-27 MED ORDER — METRONIDAZOLE 50 MG/ML ORAL SUSPENSION
500.0000 mg | Freq: Three times a day (TID) | ORAL | Status: DC
  Administered 2013-06-27 – 2013-06-28 (×3): 500 mg
  Filled 2013-06-27 (×9): qty 10

## 2013-06-27 MED ORDER — SODIUM CHLORIDE 0.45 % IV SOLN: INTRAVENOUS | Status: DC

## 2013-06-27 MED ORDER — FREE WATER: 100.0000 mL | Status: DC

## 2013-06-27 MED ORDER — FREE WATER
50.0000 mL | Status: DC
  Administered 2013-06-27 – 2013-06-30 (×18): 50 mL

## 2013-06-27 MED ORDER — LEVOFLOXACIN IN D5W 750 MG/150ML IV SOLN
INTRAVENOUS | Status: AC
  Filled 2013-06-27: qty 150

## 2013-06-27 MED ORDER — POTASSIUM CHLORIDE 20 MEQ/15ML (10%) PO LIQD
40.0000 meq | Freq: Two times a day (BID) | ORAL | Status: DC
  Administered 2013-06-27 – 2013-06-30 (×7): 40 meq
  Filled 2013-06-27 (×7): qty 30

## 2013-06-27 MED ORDER — POTASSIUM CHLORIDE CRYS ER 20 MEQ PO TBCR: 40.0000 meq | EXTENDED_RELEASE_TABLET | Freq: Two times a day (BID) | ORAL | Status: DC

## 2013-06-27 MED ORDER — SODIUM CHLORIDE 0.45 % IV SOLN
INTRAVENOUS | Status: DC
  Administered 2013-06-27 – 2013-06-29 (×3): via INTRAVENOUS
  Filled 2013-06-27 (×8): qty 1000

## 2013-06-27 NOTE — Progress Notes (Signed)
ADDENDUM:  The second C. difficile PCR was positive. We'll therefore start metronidazole every 8 hours via the PEG tube. Will narrow antibiotic from Zosyn to Levaquin and vancomycin. Continue fluconazole. Hold on starting Imodium. Treat diarrhea with as needed cholestyramine or rectal tube.

## 2013-06-27 NOTE — Progress Notes (Signed)
Nutrition Follow-up  INTERVENTION:  Continuous Glucerna 1.2  @ 55 ml/hr via PEG with 30 ml Prostat daily.    Free water 50 q 4 r via PEG  IVF-NS @ 50 ml/hr = 1200 ml q 24 hr  NUTRITION DIAGNOSIS: Malnutrition; ongoing  Goal: Pt to meet >/= 90% of their estimated nutrition needs   Monitor:  EN tolerance, labs, changes in status  Reason for Assessment: Enteral nutrition initiation/mangement   72 y.o. female  Admitting Dx: Acute respiratory failure  ASSESSMENT: Pt second c. Diff culture positive. Afebrile.  TF @ goal rate with minimal residuals 5 ml. Enteral nutrition providing 1684 kcal, 94 grams of protein, and  1062 ml of H2O which is meeting 100% of estimated energy, protein needs.   Height: Ht Readings from Last 1 Encounters:  06/25/13 5\' 6"  (1.676 m)    Weight: Wt Readings from Last 1 Encounters:  06/27/13 117 lb (53.071 kg)    Ideal Body Weight: 130# (59 kg)  % Ideal Body Weight: 95%  Wt Readings from Last 10 Encounters:  06/27/13 117 lb (53.071 kg)  06/09/13 119 lb (53.978 kg)  06/08/13 119 lb 0.8 oz (54 kg)  05/09/13 119 lb 7.8 oz (54.2 kg)  05/09/13 119 lb 7.8 oz (54.2 kg)  04/12/13 123 lb 10.9 oz (56.1 kg)  04/10/13 133 lb (60.328 kg)  04/10/13 133 lb (60.328 kg)  03/29/13 128 lb (58.06 kg)  03/25/13 128 lb 14.4 oz (58.469 kg)    Usual Body Weight: 130-133#  % Usual Body Weight: 95%  BMI:  Body mass index is 18.89 kg/(m^2).normal range  Estimated Nutritional Needs: Kcal: 1500-1800 Protein: 73-95 gr Fluid: 1800 ml daily  Skin: intact  Diet Order: NPO  EDUCATION NEEDS: -Education needs addressed  No intake or output data in the 24 hours ending 06/27/13 1440  Last BM: 06/27/13 watery stools x 2   Labs:   Recent Labs Lab 06/26/13 0436 06/26/13 0804 06/27/13 0541  NA 146* 145 140  K 2.8* 3.1* 2.9*  CL 110 110 104  CO2 28 25 28   BUN 21 19 10   CREATININE 0.45* 0.46* 0.47*  CALCIUM 8.9 8.8 8.7  MG  --  2.0  --   GLUCOSE 138*  222* 205*    CBG (last 3)   Recent Labs  06/27/13 0353 06/27/13 0732 06/27/13 1111  GLUCAP 158* 164* 178*    Scheduled Meds: . albuterol  2.5 mg Nebulization Q6H  . antiseptic oral rinse  15 mL Mouth Rinse q12n4p  . aspirin  81 mg Per Tube Daily  . atorvastatin  20 mg Per Tube Daily  . chlorhexidine  15 mL Mouth Rinse BID  . clopidogrel  75 mg Per Tube Q breakfast  . enoxaparin (LOVENOX) injection  40 mg Subcutaneous Q24H  . feeding supplement (PRO-STAT SUGAR FREE 64)  30 mL Per Tube Daily  . fluconazole (DIFLUCAN) IV  100 mg Intravenous Q24H  . free water  50 mL Per Tube Q4H  . insulin aspart  0-15 Units Subcutaneous Q4H  . insulin glargine  10 Units Subcutaneous QHS  . ipratropium  0.5 mg Nebulization Q6H  . levofloxacin (LEVAQUIN) IV  750 mg Intravenous Q24H  . metroNIDAZOLE  500 mg Per Tube TID  . pantoprazole sodium  40 mg Per Tube Daily  . potassium chloride  40 mEq Per Tube BID  . vancomycin  500 mg Intravenous Q12H    Continuous Infusions: . feeding supplement (GLUCERNA 1.2 CAL) 1,000 mL (06/27/13 1010)  .  sodium chloride 0.45 % with kcl 50 mL/hr at 06/27/13 1054    Past Medical History  Diagnosis Date  . Stroke 2011    with right hemiparesis and does not use her left leg  . Diabetes mellitus without complication   . Asthma   . HTN (hypertension)   . Tobacco abuse   . 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 COPD (chronic obstructive pulmonary disease)   . Dysphagia     s/p PEG tube  . Ischemic cardiomyopathy     EF 25% cath 04/2013, grade 1 DD  . Normocytic anemia   . C. difficile colitis 06/27/2013    Past Surgical History  Procedure Laterality Date  . Appendectomy    . Peg placement N/A 05/05/2013    Procedure: PERCUTANEOUS ENDOSCOPIC GASTROSTOMY (PEG) PLACEMENT;  Surgeon: Malissa Hippo, MD;  Location: [facility];  Service: Endoscopy;  Laterality: N/A;  . Esophagogastroduodenoscopy N/A 05/05/2013    Procedure: ESOPHAGOGASTRODUODENOSCOPY  (EGD);  Surgeon: Malissa Hippo, MD;  Location: [facility];  Service: Endoscopy;  Laterality: N/A;    Royann Shivers MS,RD,CSG,LDN Office: [phone] Pager: [phone]

## 2013-06-27 NOTE — Progress Notes (Signed)
[facility] PROGRESS NOTE  Christine Durham ZOX:[id number] DOB: November 09, 1940 DOA: 06/25/2013 PCP: Ignatius Specking., MD    Code Status: Partial code Family Communication: discussed with the patient's niece, Robyn Skog yesterday Disposition Plan: eventually discharged back to the skilled nursing facility when clinically appropriate.   Consultants:  None  Procedures:  None  Antibiotics:  Fluconazole 06/26/2013  IV vancomycin 06/25/2014  Zosyn 06/25/2014  HPI/Subjective: The patient is lying in bed. Her eyes are open. 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 She is being suctioned several times daily by nursing. Also, the patient has had 2 liquidy stools this morning.  Objective: Filed Vitals:   06/27/13 0500  BP: 115/43  Pulse: 87  Temp: 98 F (36.7 C)  Resp: 20   afebrile overnight. T. current 98.0. Pulse 93. Respiratory rate 24. Blood pressure 115/43. Oxygen saturation 93% on room air.  No intake or output data in the 24 hours ending 06/27/13 1325 Filed Weights   06/25/13 1516 06/26/13 0500 06/27/13 0300  Weight: 52 kg (114 lb 10.2 oz) 56.3 kg (124 lb 1.9 oz) 53.071 kg (117 lb)    Exam:   General:  Debilitated-appearing 72 year old woman lying in bed, in no acute distress.  Cardiovascular: S1, S2, with a soft systolic murmur.  Respiratory: Few rhonchi and occasional crackles auscultated in the upper lobes. Breathing is nonlabored.  Abdomen: Hypoactive bowel sounds. PEG tube in place without significant surrounding erythema. Soft, nontender, nondistended.  Rectum: She is sitting in a pool of liquidy brown stool.  Musculoskeletal/extremities: No pedal edema. Pedal pulses barely palpable. No acute hot joints.  Neurologic: She is alert and appears to track with her eyes. She does not lift her legs or her arms with commands. She is chronically mute, but grunts as if she is trying to speak. She has a  right facial droop.  Data Reviewed: Basic Metabolic Panel:  Recent Labs Lab 06/25/13 1133 06/26/13 0436 06/26/13 0804 06/27/13 0541  NA 150* 146* 145 140  K 5.5* 2.8* 3.1* 2.9*  CL 110 110 110 104  CO2 28 28 25 28   GLUCOSE 351* 138* 222* 205*  BUN 33* 21 19 10   CREATININE 0.66 0.45* 0.46* 0.47*  CALCIUM 10.4 8.9 8.8 8.7  MG  --   --  2.0  --    Liver Function Tests:  Recent Labs Lab 06/25/13 1133  AST 16  ALT 15  ALKPHOS 124*  BILITOT 0.3  PROT 7.8  ALBUMIN 3.6   No results found for this basename: LIPASE, AMYLASE,  in the last 168 hours No results found for this basename: AMMONIA,  in the last 168 hours CBC:  Recent Labs Lab 06/25/13 1133 06/26/13 0436 06/27/13 0541  WBC 18.9* 20.3* 14.2*  NEUTROABS 14.9*  --   --   HGB 12.9 9.9* 9.6*  HCT 41.6 32.5* 30.6*  MCV 94.5 94.8 93.3  PLT 378 297 249   Cardiac Enzymes: No results found for this basename: CKTOTAL, CKMB, CKMBINDEX, TROPONINI,  in the last 168 hours BNP (last 3 results)  Recent Labs  06/05/13 0306 06/09/13 2132  PROBNP 94.8 84.8   CBG:  Recent Labs Lab 06/26/13 2013 06/27/13 0109 06/27/13 0353 06/27/13 0732 06/27/13 1111  GLUCAP 194* 198* 158* 164* 178*    Recent Results (from the past 240 hour(s))  CULTURE, BLOOD (ROUTINE X 2)     Status: None   Collection Time    06/25/13  11:49 AM      Result Value Range Status   Specimen Description BLOOD LEFT ANTECUBITAL   Final   Special Requests BOTTLES DRAWN AEROBIC AND ANAEROBIC 8CC EACH   Final   Culture NO GROWTH 2 DAYS   Final   Report Status PENDING   Incomplete  CULTURE, BLOOD (ROUTINE X 2)     Status: None   Collection Time    06/25/13 11:56 AM      Result Value Range Status   Specimen Description BLOOD LEFT HAND   Final   Special Requests BOTTLES DRAWN AEROBIC AND ANAEROBIC 8CC EACH   Final   Culture NO GROWTH 2 DAYS   Final   Report Status PENDING   Incomplete  URINE CULTURE     Status: None   Collection Time    06/25/13   1:00 PM      Result Value Range Status   Specimen Description URINE, CATHETERIZED   Final   Special Requests NONE   Final   Culture  Setup Time     Final   Value: 06/25/2013 21:35     Performed at [facility] Count     Final   Value: >=100,000 COLONIES/ML     Performed at [facility]   Culture     Final   Value: YEAST     Performed at [facility]   Report Status 06/26/2013 FINAL   Final  MRSA PCR SCREENING     Status: None   Collection Time    06/25/13  8:31 PM      Result Value Range Status   MRSA by PCR NEGATIVE  NEGATIVE Final   Comment:            The GeneXpert MRSA Assay (FDA     approved for NASAL specimens     only), is one component of a     comprehensive MRSA colonization     surveillance program. It is not     intended to diagnose MRSA     infection nor to guide or     monitor treatment for     MRSA infections.  CLOSTRIDIUM DIFFICILE BY PCR     Status: None   Collection Time    06/26/13 12:15 PM      Result Value Range Status   C difficile by pcr NEGATIVE  NEGATIVE Final     Studies: Portable Chest 1 View  06/26/2013   CLINICAL DATA:  Pneumonia.  Acute respiratory failure.  EXAM: PORTABLE CHEST - 1 VIEW  COMPARISON:  06/25/2013 and 06/10/1999  FINDINGS: There is new slight right base atelectasis. Slight discoid atelectasis at the left base is unchanged. Heart size and pulmonary vascularity normal. No acute osseous abnormality.  IMPRESSION: New slight atelectasis at the right base.   Electronically Signed   By: Geanie Cooley M.D.   On: 06/26/2013 08:02    Scheduled Meds: . albuterol  2.5 mg Nebulization Q6H  . antiseptic oral rinse  15 mL Mouth Rinse q12n4p  . aspirin  81 mg Per Tube Daily  . atorvastatin  20 mg Per Tube Daily  . chlorhexidine  15 mL Mouth Rinse BID  . clopidogrel  75 mg Per Tube Q breakfast  . enoxaparin (LOVENOX) injection  40 mg Subcutaneous Q24H  . feeding supplement (PRO-STAT SUGAR FREE 64)  30 mL Per  Tube Daily  . fluconazole (DIFLUCAN) IV  100 mg Intravenous Q24H  . free water  100 mL Per Tube Q4H  . insulin aspart  0-15 Units Subcutaneous Q4H  . insulin glargine  10 Units Subcutaneous QHS  . ipratropium  0.5 mg Nebulization Q6H  . pantoprazole sodium  40 mg Per Tube Daily  . piperacillin-tazobactam (ZOSYN)  IV  3.375 g Intravenous Q8H  . potassium chloride  40 mEq Per Tube BID  . vancomycin  500 mg Intravenous Q12H   Continuous Infusions: . feeding supplement (GLUCERNA 1.2 CAL) 1,000 mL (06/27/13 1010)  . sodium chloride 0.45 % with kcl 50 mL/hr at 06/27/13 1054   Assessment:  Principal Problem:   Acute respiratory failure Active Problems:   NICM (nonischemic cardiomyopathy)   COPD (chronic obstructive pulmonary disease)   possible Aspiration pneumonia   Sepsis   Dysphagia   Candida UTI   Diabetes mellitus without complication   Hypernatremia   Protein-calorie malnutrition, severe   PEG (percutaneous endoscopic gastrostomy) status   Nonverbal   Hyperkalemia   Hypokalemia   Diarrhea   1. Acute respiratory failure with hypoxia, presumed to be secondary to aspiration pneumonia. Her followup chest x-ray reveals no slight atelectasis at the right lung base. Her white blood cell count has decreased from 20.2 to14.4. She is now afebrile. She is oxygenating much better. We'll continue supplemental oxygen as needed and antibiotics.  Sepsis/lactic acidosis. Likely secondary to aspiration pneumonia and possibly Candida UTI. Improving clinically.  Candidal UTI. Continue IV fluconazole.  COPD/chronic bronchitis. She has copious secretions that are being suctioned. Continue bronchodilators.  Nonischemic cardiomyopathy with ejection fraction of 25%. This appears to be compensated and stable. We'll continue aspirin, Plavix, and statin. I'm not sure why she is not on a ACE inhibitor chronically. But will hold ACE inhibitor now due to previous hyperkalemia and low-normal blood  pressures.  Hypertension. She is treated with Norvasc chronically. This is being held due to relative hypotension.  Diabetes mellitus. We'll continue insulin therapy and adjust as needed for improved glycemic control. IV fluids changed this morning to discontinue dextrose.  Hyperkalemia. Treated. Now hypokalemia. Decrease in potassium may have been from hypotonic free water given via PEG tube. Will increase supplemental potassium and follow daily. Her magnesium level was 2.0.   Hypernatremia/dehydration. This  likely secondary to volume contraction from PEG tube feedings. Now resolved on half-normal saline and free water via PEG tube. Free water will be decreased.  Diarrhea. Query antibiotic induced, but C. diff PCR is negative. We'll order a GI pathogen panel. Will treat when necessary with Imodium.  Chronic anemia. Dilutional in part.  History of stroke with aphasia, dysphagia, and hemiparesis. Continue aspirin and Plavix via PEG tube.  Nutrition: Take feedings have been restarted.   Plan: 1. Potassium chloride supplementation increased to 40 mEq twice a day. Have already added potassium to the IV fluids. Free water has been decreased. Check daily metabolic panel. 2. GI pathogen panel. 3. Add when necessary Imodium. 4. Narrow antibiotics soon.  Time spent: 40 minutes.    [facility]  [facility] Pager [phone]. If 7PM-7AM, please contact night-coverage at [email or web address], password [facility] 06/27/2013, 1:25 PM  LOS: 2 days

## 2013-06-28 LAB — BASIC METABOLIC PANEL
CO2: 27 mEq/L (ref 19–32)
Calcium: 8.8 mg/dL (ref 8.4–10.5)
Chloride: 106 mEq/L (ref 96–112)
Potassium: 3.6 mEq/L (ref 3.5–5.1)
Sodium: 141 mEq/L (ref 135–145)

## 2013-06-28 LAB — CBC
HCT: 29.7 % — ABNORMAL LOW (ref 36.0–46.0)
MCV: 92.5 fL (ref 78.0–100.0)
Platelets: 256 10*3/uL (ref 150–400)
RBC: 3.21 MIL/uL — ABNORMAL LOW (ref 3.87–5.11)
WBC: 11.1 10*3/uL — ABNORMAL HIGH (ref 4.0–10.5)

## 2013-06-28 LAB — GLUCOSE, CAPILLARY
Glucose-Capillary: 198 mg/dL — ABNORMAL HIGH (ref 70–99)
Glucose-Capillary: 209 mg/dL — ABNORMAL HIGH (ref 70–99)
Glucose-Capillary: 212 mg/dL — ABNORMAL HIGH (ref 70–99)

## 2013-06-28 MED ORDER — FLUCONAZOLE 40 MG/ML PO SUSR
400.0000 mg | Freq: Every day | ORAL | Status: DC
  Administered 2013-06-28 – 2013-06-30 (×3): 400 mg via ORAL
  Filled 2013-06-28 (×6): qty 10

## 2013-06-28 MED ORDER — VANCOMYCIN HCL IN DEXTROSE 1-5 GM/200ML-% IV SOLN
1000.0000 mg | Freq: Two times a day (BID) | INTRAVENOUS | Status: DC
  Administered 2013-06-28 – 2013-06-29 (×2): 1000 mg via INTRAVENOUS
  Filled 2013-06-28 (×5): qty 200

## 2013-06-28 MED ORDER — VANCOMYCIN 50 MG/ML ORAL SOLUTION
125.0000 mg | Freq: Four times a day (QID) | ORAL | Status: DC
  Administered 2013-06-28 – 2013-06-30 (×7): 125 mg via ORAL
  Filled 2013-06-28 (×20): qty 2.5

## 2013-06-28 NOTE — Progress Notes (Addendum)
ANTIBIOTIC CONSULT NOTE  Pharmacy Consult for Vancomycin  Indication: pneumonia and rule out sepsis  No Known Allergies  Patient Measurements: Height: 5\' 6"  (167.6 cm) Weight: 112 lb (50.803 kg) IBW/kg (Calculated) : 59.3  Vital Signs: Temp: 98.4 F (36.9 C) (12/17 0605) Temp src: Axillary (12/17 0605) BP: 127/46 mmHg (12/17 0605) Pulse Rate: 93 (12/17 0605) Intake/Output from previous day:   Intake/Output from this shift:    Labs:  Recent Labs  06/26/13 0436 06/26/13 0804 06/27/13 0541 06/28/13 0537  WBC 20.3*  --  14.2* 11.1*  HGB 9.9*  --  9.6* 9.3*  PLT 297  --  249 256  CREATININE 0.45* 0.46* 0.47* 0.48*   Estimated Creatinine Clearance: 52.5 ml/min (by C-G formula based on Cr of 0.48).  Recent Labs  06/28/13 0056  VANCOTROUGH 7.0*    Microbiology: Recent Results (from the past 720 hour(s))  URINE CULTURE     Status: None   Collection Time    06/05/13  3:34 AM      Result Value Range Status   Specimen Description URINE, CATHETERIZED   Final   Special Requests NONE   Final   Culture  Setup Time     Final   Value: 06/05/2013 15:01     Performed at [facility] Count     Final   Value: >=100,000 COLONIES/ML     Performed at [facility]   Culture     Final   Value: YEAST     Performed at [facility]   Report Status 06/06/2013 FINAL   Final  MRSA PCR SCREENING     Status: None   Collection Time    06/05/13  7:46 AM      Result Value Range Status   MRSA by PCR NEGATIVE  NEGATIVE Final   Comment:            The GeneXpert MRSA Assay (FDA     approved for NASAL specimens     only), is one component of a     comprehensive MRSA colonization     surveillance program. It is not     intended to diagnose MRSA     infection nor to guide or     monitor treatment for     MRSA infections.  CULTURE, BLOOD (ROUTINE X 2)     Status: None   Collection Time    06/09/13  9:32 PM      Result Value Range Status   Specimen  Description BLOOD RIGHT HAND   Final   Special Requests BOTTLES DRAWN AEROBIC AND ANAEROBIC [facility] EACH   Final   Culture NO GROWTH 5 DAYS   Final   Report Status 06/14/2013 FINAL   Final  CULTURE, BLOOD (ROUTINE X 2)     Status: None   Collection Time    06/09/13  9:38 PM      Result Value Range Status   Specimen Description BLOOD LEFT HAND   Final   Special Requests BOTTLES DRAWN AEROBIC AND ANAEROBIC [facility] EACH   Final   Culture NO GROWTH 5 DAYS   Final   Report Status 06/14/2013 FINAL   Final  URINE CULTURE     Status: None   Collection Time    06/09/13 10:33 PM      Result Value Range Status   Specimen Description URINE, CATHETERIZED   Final   Special Requests NONE   Final   Culture  Setup Time  Final   Value: 06/10/2013 19:16     Performed at [facility] Count     Final   Value: 55,000 COLONIES/ML     Performed at [facility]   Culture     Final   Value: YEAST     Performed at [facility]   Report Status 06/11/2013 FINAL   Final  CULTURE, BLOOD (ROUTINE X 2)     Status: None   Collection Time    06/25/13 11:49 AM      Result Value Range Status   Specimen Description BLOOD LEFT ANTECUBITAL   Final   Special Requests BOTTLES DRAWN AEROBIC AND ANAEROBIC 8CC EACH   Final   Culture NO GROWTH 2 DAYS   Final   Report Status PENDING   Incomplete  CULTURE, BLOOD (ROUTINE X 2)     Status: None   Collection Time    06/25/13 11:56 AM      Result Value Range Status   Specimen Description BLOOD LEFT HAND   Final   Special Requests BOTTLES DRAWN AEROBIC AND ANAEROBIC 8CC EACH   Final   Culture NO GROWTH 2 DAYS   Final   Report Status PENDING   Incomplete  URINE CULTURE     Status: None   Collection Time    06/25/13  1:00 PM      Result Value Range Status   Specimen Description URINE, CATHETERIZED   Final   Special Requests NONE   Final   Culture  Setup Time     Final   Value: 06/25/2013 21:35     Performed at [facility]  Count     Final   Value: >=100,000 COLONIES/ML     Performed at [facility]   Culture     Final   Value: YEAST     Performed at [facility]   Report Status 06/26/2013 FINAL   Final  MRSA PCR SCREENING     Status: None   Collection Time    06/25/13  8:31 PM      Result Value Range Status   MRSA by PCR NEGATIVE  NEGATIVE Final   Comment:            The GeneXpert MRSA Assay (FDA     approved for NASAL specimens     only), is one component of a     comprehensive MRSA colonization     surveillance program. It is not     intended to diagnose MRSA     infection nor to guide or     monitor treatment for     MRSA infections.  CLOSTRIDIUM DIFFICILE BY PCR     Status: None   Collection Time    06/26/13 12:15 PM      Result Value Range Status   C difficile by pcr NEGATIVE  NEGATIVE Final  CLOSTRIDIUM DIFFICILE BY PCR     Status: Abnormal   Collection Time    06/27/13 10:30 AM      Result Value Range Status   C difficile by pcr POSITIVE (*) NEGATIVE Final   Comment: CRITICAL RESULT CALLED TO, READ BACK BY AND VERIFIED WITH:     SCHONEWITZ,L. AT 1403 ON 06/27/2013 BY BAUGHAM,M.   Medical History: Past Medical History  Diagnosis Date  . Stroke 2011    with right hemiparesis and does not use her left leg  . Diabetes mellitus without complication   .  Asthma   . HTN (hypertension)   . Tobacco abuse   . 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 COPD (chronic obstructive pulmonary disease)   . Dysphagia     s/p PEG tube  . Ischemic cardiomyopathy     EF 25% cath 04/2013, grade 1 DD  . Normocytic anemia   . C. difficile colitis 06/27/2013   Medications:  Scheduled:  . albuterol  2.5 mg Nebulization Q6H  . antiseptic oral rinse  15 mL Mouth Rinse q12n4p  . aspirin  81 mg Per Tube Daily  . atorvastatin  20 mg Per Tube Daily  . chlorhexidine  15 mL Mouth Rinse BID  . clopidogrel  75 mg Per Tube Q breakfast  . enoxaparin (LOVENOX) injection  40 mg Subcutaneous Q24H   . feeding supplement (PRO-STAT SUGAR FREE 64)  30 mL Per Tube Daily  . fluconazole (DIFLUCAN) IV  100 mg Intravenous Q24H  . free water  50 mL Per Tube Q4H  . insulin aspart  0-15 Units Subcutaneous Q4H  . insulin glargine  10 Units Subcutaneous QHS  . ipratropium  0.5 mg Nebulization Q6H  . levofloxacin (LEVAQUIN) IV  750 mg Intravenous Q24H  . metroNIDAZOLE  500 mg Per Tube TID  . pantoprazole sodium  40 mg Per Tube Daily  . potassium chloride  40 mEq Per Tube BID  . vancomycin  500 mg Intravenous Q12H   Assessment: Okay for Protocol 71yo female on broad spectrum ABX for pneumonia and possible sepsis when admitted.  Pt has good renal fxn.  Estimated Creatinine Clearance: 52.5 ml/min (by C-G formula based on Cr of 0.48). Trough level is below target.  All doses charted as given.  Vancomycin 12/14 >> Zosyn 12/14 >> 12/16 Fluconazole 12/15 >> Levaquin 12/16 >>  Goal of Therapy:  Vancomycin trough level 15-20 mcg/ml  Plan:  Increase Vancomycin to 1000mg  IV every 12 hours. Measure antibiotic drug levels at steady state Follow up culture results Monitor renal fxn.    Christine Durham A 06/28/2013,8:24 AM

## 2013-06-28 NOTE — Progress Notes (Signed)
[facility] PROGRESS NOTE  Christine Durham ZOX:[id number] DOB: [birth year] DOA: 06/25/2013 PCP: Ignatius Specking., MD    Code Status: Partial code Family Communication: discussed with the patient's niece, Robyn Procell yesterday Disposition Plan: eventually discharged back to the skilled nursing facility when clinically appropriate.   Consultants:  None  Procedures:  None  Antibiotics:  Fluconazole 06/26/2013  IV vancomycin 06/25/2014  Zosyn 06/25/2014  HPI/Subjective: The patient is lying in bed. Her eyes are open. 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 T. current 98.0. Pulse 93. Respiratory rate 24. Blood pressure 115/43. Oxygen saturation 93% on room air.  No intake or output data in the 24 hours ending 06/28/13 1553 Filed Weights   06/26/13 0500 06/27/13 0300 06/28/13 0351  Weight: 56.3 kg (124 lb 1.9 oz) 53.071 kg (117 lb) 50.803 kg (112 lb)    Exam:   General:  Debilitated-appearing 72 year old woman lying in bed, in no acute distress.  Cardiovascular: S1, S2, with a soft systolic murmur.  Respiratory: Few rhonchi and occasional crackles auscultated in the upper lobes. Breathing is nonlabored.  Abdomen: Hypoactive bowel sounds. PEG tube in place without significant surrounding erythema. Soft, nontender, nondistended.  Data Reviewed: Basic Metabolic Panel:  Recent Labs Lab 06/25/13 1133 06/26/13 0436 06/26/13 0804 06/27/13 0541 06/28/13 0537  NA 150* 146* 145 140 141  K 5.5* 2.8* 3.1* 2.9* 3.6  CL 110 110 110 104 106  CO2 28 28 25 28 27   GLUCOSE 351* 138* 222* 205* 158*  BUN 33* 21 19 10 6   CREATININE 0.66 0.45* 0.46* 0.47* 0.48*  CALCIUM 10.4 8.9 8.8 8.7 8.8  MG  --   --  2.0  --   --    Liver Function Tests:  Recent Labs Lab 06/25/13 1133  AST 16  ALT 15    ALKPHOS 124*  BILITOT 0.3  PROT 7.8  ALBUMIN 3.6   No results found for this basename: LIPASE, AMYLASE,  in the last 168 hours No results found for this basename: AMMONIA,  in the last 168 hours CBC:  Recent Labs Lab 06/25/13 1133 06/26/13 0436 06/27/13 0541 06/28/13 0537  WBC 18.9* 20.3* 14.2* 11.1*  NEUTROABS 14.9*  --   --   --   HGB 12.9 9.9* 9.6* 9.3*  HCT 41.6 32.5* 30.6* 29.7*  MCV 94.5 94.8 93.3 92.5  PLT 378 297 249 256   Cardiac Enzymes: No results found for this basename: CKTOTAL, CKMB, CKMBINDEX, TROPONINI,  in the last 168 hours BNP (last 3 results)  Recent Labs  06/05/13 0306 06/09/13 2132  PROBNP 94.8 84.8   CBG:  Recent Labs Lab 06/27/13 2104 06/27/13 2356 06/28/13 0330 06/28/13 0753 06/28/13 1159  GLUCAP 177* 212* 115* 209* 198*    Recent Results (from the past 240 hour(s))  CULTURE, BLOOD (ROUTINE X 2)     Status: None   Collection Time    06/25/13 11:49 AM      Result Value Range Status   Specimen Description BLOOD LEFT ANTECUBITAL   Final   Special Requests BOTTLES DRAWN AEROBIC AND ANAEROBIC 8CC EACH   Final   Culture NO GROWTH 3 DAYS   Final   Report Status PENDING   Incomplete  CULTURE, BLOOD (ROUTINE X 2)     Status: None   Collection  Time    06/25/13 11:56 AM      Result Value Range Status   Specimen Description BLOOD LEFT HAND   Final   Special Requests BOTTLES DRAWN AEROBIC AND ANAEROBIC 8CC EACH   Final   Culture NO GROWTH 3 DAYS   Final   Report Status PENDING   Incomplete  URINE CULTURE     Status: None   Collection Time    06/25/13  1:00 PM      Result Value Range Status   Specimen Description URINE, CATHETERIZED   Final   Special Requests NONE   Final   Culture  Setup Time     Final   Value: 06/25/2013 21:35     Performed at [facility] Count     Final   Value: >=100,000 COLONIES/ML     Performed at [facility]   Culture     Final   Value: YEAST     Performed at [facility]    Report Status 06/26/2013 FINAL   Final  MRSA PCR SCREENING     Status: None   Collection Time    06/25/13  8:31 PM      Result Value Range Status   MRSA by PCR NEGATIVE  NEGATIVE Final   Comment:            The GeneXpert MRSA Assay (FDA     approved for NASAL specimens     only), is one component of a     comprehensive MRSA colonization     surveillance program. It is not     intended to diagnose MRSA     infection nor to guide or     monitor treatment for     MRSA infections.  CLOSTRIDIUM DIFFICILE BY PCR     Status: None   Collection Time    06/26/13 12:15 PM      Result Value Range Status   C difficile by pcr NEGATIVE  NEGATIVE Final  CLOSTRIDIUM DIFFICILE BY PCR     Status: Abnormal   Collection Time    06/27/13 10:30 AM      Result Value Range Status   C difficile by pcr POSITIVE (*) NEGATIVE Final   Comment: CRITICAL RESULT CALLED TO, READ BACK BY AND VERIFIED WITH:     SCHONEWITZ,L. AT 1403 ON 06/27/2013 BY BAUGHAM,M.     Studies: No results found.  Scheduled Meds: . albuterol  2.5 mg Nebulization Q6H  . antiseptic oral rinse  15 mL Mouth Rinse q12n4p  . aspirin  81 mg Per Tube Daily  . atorvastatin  20 mg Per Tube Daily  . chlorhexidine  15 mL Mouth Rinse BID  . clopidogrel  75 mg Per Tube Q breakfast  . enoxaparin (LOVENOX) injection  40 mg Subcutaneous Q24H  . feeding supplement (PRO-STAT SUGAR FREE 64)  30 mL Per Tube Daily  . fluconazole (DIFLUCAN) IV  100 mg Intravenous Q24H  . free water  50 mL Per Tube Q4H  . insulin aspart  0-15 Units Subcutaneous Q4H  . insulin glargine  10 Units Subcutaneous QHS  . ipratropium  0.5 mg Nebulization Q6H  . levofloxacin (LEVAQUIN) IV  750 mg Intravenous Q24H  . metroNIDAZOLE  500 mg Per Tube TID  . pantoprazole sodium  40 mg Per Tube Daily  . potassium chloride  40 mEq Per Tube BID  . vancomycin  1,000 mg Intravenous Q12H   Continuous Infusions: . feeding supplement (GLUCERNA  1.2 CAL) 1,000 mL (06/28/13 0854)   . sodium chloride 0.45 % with kcl 50 mL/hr at 06/28/13 6213   Assessment:  Principal Problem:   Acute respiratory failure Active Problems:   Diabetes mellitus without complication   NICM (nonischemic cardiomyopathy)   COPD (chronic obstructive pulmonary disease)   Dysphagia   possible Aspiration pneumonia   Hypernatremia   Protein-calorie malnutrition, severe   Normocytic anemia   Sepsis   PEG (percutaneous endoscopic gastrostomy) status   Nonverbal   Hyperkalemia   Hypokalemia   Candida UTI   Diarrhea   C. difficile colitis   1. Acute respiratory failure with hypoxia, presumed to be secondary to aspiration pneumonia. Her followup chest x-ray reveals no slight atelectasis at the right lung base. Her white blood cell count has decreased from 20.2 to 11.1   She is now afebrile. She is oxygenating much better. We'll continue supplemental oxygen as needed and antibiotics.  Sepsis/lactic acidosis, multifactorial 2/2 to aspiration pneumonia and possibly Candida UTI.  Started flagyll 12/16,   to Vacnomycin PO 12/17  Candidal UTI. Continue IV fluconazole-transition to suspension 400 mg daily-stop date 12/29  COPD/chronic bronchitis. She has copious secretions that are being suctioned. Continue bronchodilators.  Nonischemic cardiomyopathy with ejection fraction of 25%. This appears to be compensated and stable. We'll continue aspirin, Plavix, and statin. I'm not sure why she is not on a ACE inhibitor chronically. But will hold ACE inhibitor now due to previous hyperkalemia and low-normal blood pressures.  Hypertension. She is treated with Norvasc chronically. This is being held due to relative hypotension.  Diabetes mellitus. We'll continue insulin therapy and adjust as needed for improved glycemic control. IV fluids changed this morning to discontinue dextrose.  Gettin Glucerna TF  Hyperkalemia. Treated. Now hypokalemia. Decrease in potassium may have been from hypotonic free water  given via PEG tube. Will increase supplemental potassium and follow daily. Her magnesium level was 2.0.   Hypernatremia/dehydration. This  likely secondary to volume contraction from PEG tube feedings. Now resolved on half-normal saline and free water via PEG tube. Free water will be decreased.  Chronic anemia. Dilutional in part.  History of stroke with aphasia, dysphagia, and hemiparesis. Continue aspirin and Plavix via PEG tube.  Nutrition: Take feedings have been restarted.   Pleas Koch, MD [facility] [phone]

## 2013-06-28 NOTE — Clinical Social Work Note (Signed)
Updated [facility] on pt. Notified them that C diff PCR was positive. Plan will be return to [facility] when stable. CSW to continue to follow.  Derenda Fennel, [place] [phone]

## 2013-06-28 NOTE — Progress Notes (Signed)
Nutrition Follow-up  INTERVENTION:  Continuous Glucerna 1.2  @ 55 ml/hr via PEG with 30 ml Prostat daily.    Free water 50 q 4 r via PEG  IVF-NS @ 50 ml/hr = 1200 ml q 24 hr  NUTRITION DIAGNOSIS: Malnutrition; ongoing  Goal: Pt to meet >/= 90% of their estimated nutrition needs   Monitor:  EN tolerance, labs, changes in status  Reason for Assessment: Enteral nutrition initiation/mangement   72 y.o. female  Admitting Dx: Acute respiratory failure  ASSESSMENT: Today 1 BM and tolerating TF well at goal per RN.  Height: Ht Readings from Last 1 Encounters:  06/25/13 5\' 6"  (1.676 m)    Weight: Wt Readings from Last 1 Encounters:  06/28/13 112 lb (50.803 kg)    Ideal Body Weight: 130# (59 kg)  % Ideal Body Weight: 95%  Wt Readings from Last 10 Encounters:  06/28/13 112 lb (50.803 kg)  06/09/13 119 lb (53.978 kg)  06/08/13 119 lb 0.8 oz (54 kg)  05/09/13 119 lb 7.8 oz (54.2 kg)  05/09/13 119 lb 7.8 oz (54.2 kg)  04/12/13 123 lb 10.9 oz (56.1 kg)  04/10/13 133 lb (60.328 kg)  04/10/13 133 lb (60.328 kg)  03/29/13 128 lb (58.06 kg)  03/25/13 128 lb 14.4 oz (58.469 kg)    Usual Body Weight: 130-133#  % Usual Body Weight: 95%  BMI:  Body mass index is 18.09 kg/(m^2).normal range  Estimated Nutritional Needs: Kcal: 1500-1800 Protein: 73-95 gr Fluid: 1800 ml daily  Skin: intact  Diet Order: NPO  EDUCATION NEEDS: -Education needs addressed  No intake or output data in the 24 hours ending 06/28/13 1306  Last BM: 06/27/13 watery stools x 2   Labs:   Recent Labs Lab 06/26/13 0804 06/27/13 0541 06/28/13 0537  NA 145 140 141  K 3.1* 2.9* 3.6  CL 110 104 106  CO2 25 28 27   BUN 19 10 6   CREATININE 0.46* 0.47* 0.48*  CALCIUM 8.8 8.7 8.8  MG 2.0  --   --   GLUCOSE 222* 205* 158*    CBG (last 3)   Recent Labs  06/28/13 0330 06/28/13 0753 06/28/13 1159  GLUCAP 115* 209* 198*    Scheduled Meds: . albuterol  2.5 mg Nebulization Q6H  .  antiseptic oral rinse  15 mL Mouth Rinse q12n4p  . aspirin  81 mg Per Tube Daily  . atorvastatin  20 mg Per Tube Daily  . chlorhexidine  15 mL Mouth Rinse BID  . clopidogrel  75 mg Per Tube Q breakfast  . enoxaparin (LOVENOX) injection  40 mg Subcutaneous Q24H  . feeding supplement (PRO-STAT SUGAR FREE 64)  30 mL Per Tube Daily  . fluconazole (DIFLUCAN) IV  100 mg Intravenous Q24H  . free water  50 mL Per Tube Q4H  . insulin aspart  0-15 Units Subcutaneous Q4H  . insulin glargine  10 Units Subcutaneous QHS  . ipratropium  0.5 mg Nebulization Q6H  . levofloxacin (LEVAQUIN) IV  750 mg Intravenous Q24H  . metroNIDAZOLE  500 mg Per Tube TID  . pantoprazole sodium  40 mg Per Tube Daily  . potassium chloride  40 mEq Per Tube BID  . vancomycin  1,000 mg Intravenous Q12H    Continuous Infusions: . feeding supplement (GLUCERNA 1.2 CAL) 1,000 mL (06/28/13 0854)  . sodium chloride 0.45 % with kcl 50 mL/hr at 06/28/13 4098    Past Medical History  Diagnosis Date  . Stroke 2011    with right  hemiparesis and does not use her left leg  . Diabetes mellitus without complication   . Asthma   . HTN (hypertension)   . Tobacco abuse   . 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 COPD (chronic obstructive pulmonary disease)   . Dysphagia     s/p PEG tube  . Ischemic cardiomyopathy     EF 25% cath 04/2013, grade 1 DD  . Normocytic anemia   . C. difficile colitis 06/27/2013    Past Surgical History  Procedure Laterality Date  . Appendectomy    . Peg placement N/A 05/05/2013    Procedure: PERCUTANEOUS ENDOSCOPIC GASTROSTOMY (PEG) PLACEMENT;  Surgeon: Malissa Hippo, MD;  Location: [facility];  Service: Endoscopy;  Laterality: N/A;  . Esophagogastroduodenoscopy N/A 05/05/2013    Procedure: ESOPHAGOGASTRODUODENOSCOPY (EGD);  Surgeon: Malissa Hippo, MD;  Location: [facility];  Service: Endoscopy;  Laterality: N/A;    Royann Shivers MS,RD,CSG,LDN Office: [phone] Pager: [phone]

## 2013-06-29 LAB — GI PATHOGEN PANEL BY PCR, STOOL
C difficile toxin A/B: NEGATIVE
Campylobacter by PCR: NEGATIVE
Cryptosporidium by PCR: NEGATIVE
E coli (ETEC) LT/ST: NEGATIVE
E coli (STEC): NEGATIVE
Norovirus GI/GII: NEGATIVE
Rotavirus A by PCR: NEGATIVE
Salmonella by PCR: NEGATIVE

## 2013-06-29 LAB — BASIC METABOLIC PANEL
CO2: 27 mEq/L (ref 19–32)
Calcium: 9.4 mg/dL (ref 8.4–10.5)
Chloride: 101 mEq/L (ref 96–112)
Glucose, Bld: 223 mg/dL — ABNORMAL HIGH (ref 70–99)
Sodium: 136 mEq/L (ref 135–145)

## 2013-06-29 LAB — GLUCOSE, CAPILLARY
Glucose-Capillary: 163 mg/dL — ABNORMAL HIGH (ref 70–99)
Glucose-Capillary: 228 mg/dL — ABNORMAL HIGH (ref 70–99)
Glucose-Capillary: 223 mg/dL — ABNORMAL HIGH (ref 70–99)
Glucose-Capillary: 193 mg/dL — ABNORMAL HIGH (ref 70–99)

## 2013-06-29 MED ORDER — LEVOFLOXACIN 750 MG PO TABS
750.0000 mg | ORAL_TABLET | Freq: Every day | ORAL | Status: DC
  Administered 2013-06-29 – 2013-06-30 (×2): 750 mg via ORAL
  Filled 2013-06-29 (×2): qty 1

## 2013-06-29 MED ORDER — INSULIN ASPART 100 UNIT/ML ~~LOC~~ SOLN
2.0000 [IU] | SUBCUTANEOUS | Status: DC
  Administered 2013-06-29 – 2013-06-30 (×6): 2 [IU] via SUBCUTANEOUS

## 2013-06-29 NOTE — Clinical Social Work Note (Signed)
Sister/duardian signed MOST form.  Would like hospice consult at [facility] to explore options to manage respiratory distress symptoms and remain at SNF.  CSW will inform [facility] to arrange.  Santa Genera, LCSW Clinical Social Worker [phone])

## 2013-06-29 NOTE — Progress Notes (Signed)
Inpatient Diabetes Program Recommendations  AACE/ADA: New Consensus Statement on Inpatient Glycemic Control (2013)  Target Ranges:  Prepandial:   less than 140 mg/dL      Peak postprandial:   less than 180 mg/dL (1-2 hours)      Critically ill patients:  140 - 180 mg/dL   Results for Christine Durham, Christine Durham (MRN [id number]) as of 06/29/2013 10:43  Ref. Range 06/28/2013 11:59 06/28/2013 16:51 06/28/2013 20:10 06/28/2013 23:45 06/29/2013 03:13 06/29/2013 08:01  Glucose-Capillary Latest Range: 70-99 mg/dL 409 (H) 811 (H) 914 (H) 248 (H) 163 (H) 228 (H)    Inpatient Diabetes Program Recommendations Insulin - Basal: Please conisder increasing Lantus to 12 units QHS. Insulin - Meal Coverage: Please consider ordering Novolog 2 units Q4H for tube feeding coverage.  Thanks, Orlando Penner, RN, MSN, CCRN Diabetes Coordinator Inpatient Diabetes Program [phone] (Team Pager) [phone] ([facility] office) [phone] [facility] office)

## 2013-06-29 NOTE — Progress Notes (Signed)
[facility] PROGRESS NOTE  Christine Durham XBJ:[id number] DOB: 1941-01-02 DOA: 06/25/2013 PCP: Ignatius Specking., MD    Code Status: Partial code Family Communication: discussed with the patient's niece, Christine Durham yesterday Disposition Plan: eventually discharged back to the skilled nursing facility when clinically appropriate.   Consultants:  None  Procedures:  None  Antibiotics:  Fluconazole 06/26/2013--->12/29 [changed to suspension Fluconazole 12/18]  IV vancomycin 06/25/2014-->12/16  Flagyll IV 12/16-12/17  PO Vanc 12/17----->12/30  Zosyn 06/25/2014  HPI/Subjective: The patient is lying in bed. Her eyes are open. 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 T. current 98.0. Pulse 93. Respiratory rate 24. Blood pressure 115/43. Oxygen saturation 93% on room air.  No intake or output data in the 24 hours ending 06/29/13 1624 Filed Weights   06/27/13 0300 06/28/13 0351 06/29/13 0436  Weight: 53.071 kg (117 lb) 50.803 kg (112 lb) 53 kg (116 lb 13.5 oz)    Exam:   General:  Debilitated-appearing 72 year old woman lying in bed, in no acute distress.  Mucosa dry and bloody  Cardiovascular: S1, S2, with a soft systolic murmur.  Respiratory: Few rhonchi and occasional crackles auscultated in the upper lobes. Breathing is nonlabored.  Abdomen: Hypoactive bowel sounds. PEG tube in place without significant surrounding erythema. Soft, nontender, nondistended.  Data Reviewed: Basic Metabolic Panel:  Recent Labs Lab 06/26/13 0436 06/26/13 0804 06/27/13 0541 06/28/13 0537 06/29/13 0539  NA 146* 145 140 141 136  K 2.8* 3.1* 2.9* 3.6 4.2  CL 110 110 104 106 101  CO2 28 25 28 27 27   GLUCOSE 138* 222* 205* 158* 223*  BUN 21 19 10 6 8   CREATININE 0.45* 0.46* 0.47*  0.48* 0.50  CALCIUM 8.9 8.8 8.7 8.8 9.4  MG  --  2.0  --   --   --    Liver Function Tests:  Recent Labs Lab 06/25/13 1133  AST 16  ALT 15  ALKPHOS 124*  BILITOT 0.3  PROT 7.8  ALBUMIN 3.6   No results found for this basename: LIPASE, AMYLASE,  in the last 168 hours No results found for this basename: AMMONIA,  in the last 168 hours CBC:  Recent Labs Lab 06/25/13 1133 06/26/13 0436 06/27/13 0541 06/28/13 0537  WBC 18.9* 20.3* 14.2* 11.1*  NEUTROABS 14.9*  --   --   --   HGB 12.9 9.9* 9.6* 9.3*  HCT 41.6 32.5* 30.6* 29.7*  MCV 94.5 94.8 93.3 92.5  PLT 378 297 249 256   Cardiac Enzymes: No results found for this basename: CKTOTAL, CKMB, CKMBINDEX, TROPONINI,  in the last 168 hours BNP (last 3 results)  Recent Labs  06/05/13 0306 06/09/13 2132  PROBNP 94.8 84.8   CBG:  Recent Labs Lab 06/28/13 2010 06/28/13 2345 06/29/13 0313 06/29/13 0801 06/29/13 1156  GLUCAP 191* 248* 163* 228* 198*    Recent Results (from the past 240 hour(s))  CULTURE, BLOOD (ROUTINE X 2)     Status: None   Collection Time    06/25/13 11:49 AM      Result Value Range Status   Specimen Description BLOOD LEFT ANTECUBITAL   Final   Special Requests BOTTLES DRAWN AEROBIC AND ANAEROBIC 8CC EACH   Final   Culture NO GROWTH 3 DAYS  Final   Report Status PENDING   Incomplete  CULTURE, BLOOD (ROUTINE X 2)     Status: None   Collection Time    06/25/13 11:56 AM      Result Value Range Status   Specimen Description BLOOD LEFT HAND   Final   Special Requests BOTTLES DRAWN AEROBIC AND ANAEROBIC 8CC EACH   Final   Culture NO GROWTH 3 DAYS   Final   Report Status PENDING   Incomplete  URINE CULTURE     Status: None   Collection Time    06/25/13  1:00 PM      Result Value Range Status   Specimen Description URINE, CATHETERIZED   Final   Special Requests NONE   Final   Culture  Setup Time     Final   Value: 06/25/2013 21:35     Performed at [facility] Count      Final   Value: >=100,000 COLONIES/ML     Performed at [facility]   Culture     Final   Value: YEAST     Performed at [facility]   Report Status 06/26/2013 FINAL   Final  MRSA PCR SCREENING     Status: None   Collection Time    06/25/13  8:31 PM      Result Value Range Status   MRSA by PCR NEGATIVE  NEGATIVE Final   Comment:            The GeneXpert MRSA Assay (FDA     approved for NASAL specimens     only), is one component of a     comprehensive MRSA colonization     surveillance program. It is not     intended to diagnose MRSA     infection nor to guide or     monitor treatment for     MRSA infections.  CLOSTRIDIUM DIFFICILE BY PCR     Status: None   Collection Time    06/26/13 12:15 PM      Result Value Range Status   C difficile by pcr NEGATIVE  NEGATIVE Final  CLOSTRIDIUM DIFFICILE BY PCR     Status: Abnormal   Collection Time    06/27/13 10:30 AM      Result Value Range Status   C difficile by pcr POSITIVE (*) NEGATIVE Final   Comment: CRITICAL RESULT CALLED TO, READ BACK BY AND VERIFIED WITH:     SCHONEWITZ,L. AT 1403 ON 06/27/2013 BY BAUGHAM,M.  CLOSTRIDIUM DIFFICILE BY PCR     Status: Abnormal   Collection Time    06/29/13  3:56 AM      Result Value Range Status   C difficile by pcr POSITIVE (*) NEGATIVE Final   Comment: CRITICAL RESULT CALLED TO, READ BACK BY AND VERIFIED WITH:     SCHONEWITZ,L. AT 1111 ON 06/29/2013 BY BAUGHAM,M.     Studies: No results found.  Scheduled Meds: . albuterol  2.5 mg Nebulization Q6H  . antiseptic oral rinse  15 mL Mouth Rinse q12n4p  . aspirin  81 mg Per Tube Daily  . atorvastatin  20 mg Per Tube Daily  . chlorhexidine  15 mL Mouth Rinse BID  . clopidogrel  75 mg Per Tube Q breakfast  . enoxaparin (LOVENOX) injection  40 mg Subcutaneous Q24H  . feeding supplement (PRO-STAT SUGAR FREE 64)  30 mL Per Tube Daily  . fluconazole  400 mg Oral Daily  . free water  50 mL Per Tube Q4H  . insulin aspart  0-15  Units Subcutaneous Q4H  . insulin glargine  10 Units Subcutaneous QHS  . ipratropium  0.5 mg Nebulization Q6H  . levofloxacin (LEVAQUIN) IV  750 mg Intravenous Q24H  . pantoprazole sodium  40 mg Per Tube Daily  . potassium chloride  40 mEq Per Tube BID  . vancomycin  125 mg Oral QID   Continuous Infusions: . feeding supplement (GLUCERNA 1.2 CAL) 1,000 mL (06/29/13 1041)  . sodium chloride 0.45 % with kcl 50 mL/hr at 06/29/13 1039   Assessment:  Principal Problem:   Acute respiratory failure Active Problems:   Diabetes mellitus without complication   NICM (nonischemic cardiomyopathy)   COPD (chronic obstructive pulmonary disease)   Dysphagia   possible Aspiration pneumonia   Hypernatremia   Protein-calorie malnutrition, severe   Normocytic anemia   Sepsis   PEG (percutaneous endoscopic gastrostomy) status   Nonverbal   Hyperkalemia   Hypokalemia   Candida UTI   Diarrhea   C. difficile colitis   1. Acute respiratory failure with hypoxia, presumed to be secondary to aspiration pneumonia. Her followup chest x-ray reveals no slight atelectasis at the right lung base. Her white blood cell count has decreased from 20.2 to 11.1   She is now afebrile. She is oxygenating much better. We'll continue supplemental oxygen as needed and antibiotics.  Get CXR 12/18 as is coughing.  Sepsis/lactic acidosis, multifactorial 2/2 to aspiration pneumonia and possibly Candida UTI.  Started flagyll 12/16,   to Vacnomycin PO 12/17 Levaquin stop date 12/24, transitioned to by tube on 12/18  Candidal UTI. Continue IV fluconazole-transition to suspension 400 mg daily-stop date 12/29 or 12/30  COPD/chronic bronchitis. She has copious secretions that are being suctioned. Continue bronchodilators.  Nonischemic cardiomyopathy with ejection fraction of 25%. This appears to be compensated and stable. We'll continue aspirin, Plavix, and statin. I'm not sure why she is not on a ACE inhibitor chronically.  But will hold ACE inhibitor now due to previous hyperkalemia and low-normal blood pressures.  Hypertension. She is treated with Norvasc chronically. This is being held due to relative hypotension.  Diabetes mellitus. We'll continue insulin therapy and adjust as needed for improved glycemic control. IV fluids changed this morning to discontinue dextrose.  Gettin Glucerna TF  Hyperkalemia. Treated. Now hypokalemia. Decrease in potassium may have been from hypotonic free water given via PEG tube. Will increase supplemental potassium and follow daily. Her magnesium level was 2.0.   Hypernatremia/dehydration. This  likely secondary to volume contraction from PEG tube feedings. Now resolved on half-normal saline and free water via PEG tube. Free water will be decreased.  Chronic anemia. Dilutional in part.  History of stroke with aphasia, dysphagia, and hemiparesis. Continue aspirin and Plavix via PEG tube.  Nutrition: Take feedings have been restarted.    15 to 20 minute discussion with patient's sister at the bedside today. We discussed that patient has had multiple admissions recently and these have left her more and more debilitated each time. Patient's sister expresses concerns about her staying in the nursing home and becoming short of breath again and the discomfort because her-we discussed options including do not hospitalize, DO NOT RESUSCITATE-for now she is a partial code wherein she will get IV antibiotics, IV fluids and no cardioversion or intubation. The most form was signed. I would recommend further delineation of goals of care, and hospice can probably follow this patient early in January at the facility  [facility],  MD [facility] (P) [phone]

## 2013-06-30 ENCOUNTER — Inpatient Hospital Stay
Admission: RE | Admit: 2013-06-30 | Discharge: 2013-07-13 | Disposition: E | Payer: Medicare Other | Source: Ambulatory Visit | Attending: Internal Medicine | Admitting: Internal Medicine

## 2013-06-30 LAB — CBC WITH DIFFERENTIAL/PLATELET
Eosinophils Relative: 12 % — ABNORMAL HIGH (ref 0–5)
HCT: 33 % — ABNORMAL LOW (ref 36.0–46.0)
Hemoglobin: 10.2 g/dL — ABNORMAL LOW (ref 12.0–15.0)
Lymphocytes Relative: 16 % (ref 12–46)
Lymphs Abs: 1.7 10*3/uL (ref 0.7–4.0)
MCH: 28.7 pg (ref 26.0–34.0)
MCHC: 30.9 g/dL (ref 30.0–36.0)
MCV: 92.7 fL (ref 78.0–100.0)
Monocytes Relative: 6 % (ref 3–12)
Neutrophils Relative %: 65 % (ref 43–77)
Platelets: 314 10*3/uL (ref 150–400)
RBC: 3.56 MIL/uL — ABNORMAL LOW (ref 3.87–5.11)
RDW: 15.4 % (ref 11.5–15.5)
WBC: 10.4 10*3/uL (ref 4.0–10.5)

## 2013-06-30 LAB — RENAL FUNCTION PANEL
CO2: 27 mEq/L (ref 19–32)
Chloride: 101 mEq/L (ref 96–112)
GFR calc Af Amer: >90 mL/min (ref >90)
GFR calc non Af Amer: >90 mL/min (ref >90)
Glucose, Bld: 280 mg/dL — ABNORMAL HIGH (ref 70–99)
Phosphorus: 3.7 mg/dL (ref 2.3–4.6)
Potassium: 4.5 mEq/L (ref 3.5–5.1)
Sodium: 137 mEq/L (ref 135–145)

## 2013-06-30 LAB — BASIC METABOLIC PANEL
CO2: 26 mEq/L (ref 19–32)
Chloride: 102 mEq/L (ref 96–112)
Creatinine, Ser: 0.41 mg/dL — ABNORMAL LOW (ref 0.50–1.10)
Potassium: 4.6 mEq/L (ref 3.5–5.1)
Sodium: 139 mEq/L (ref 135–145)

## 2013-06-30 LAB — GLUCOSE, CAPILLARY
Glucose-Capillary: 277 mg/dL — ABNORMAL HIGH (ref 70–99)
Glucose-Capillary: 248 mg/dL — ABNORMAL HIGH (ref 70–99)
Glucose-Capillary: 287 mg/dL — ABNORMAL HIGH (ref 70–99)

## 2013-06-30 MED ORDER — LEVOFLOXACIN 750 MG PO TABS: 750.0000 mg | ORAL_TABLET | Freq: Every day | ORAL | Status: AC

## 2013-06-30 MED ORDER — VANCOMYCIN 50 MG/ML ORAL SOLUTION: 125.0000 mg | Freq: Four times a day (QID) | ORAL | Status: AC

## 2013-06-30 MED ORDER — FLUCONAZOLE 40 MG/ML PO SUSR: 400.0000 mg | Freq: Every day | ORAL | Status: AC

## 2013-06-30 MED ORDER — SIMETHICONE 40 MG/0.6ML PO SUSP
ORAL | Status: AC
  Filled 2013-06-30: qty 0.6

## 2013-06-30 NOTE — Discharge Summary (Signed)
[facility]                                                                                   Christine Durham, is a 72 y.o. female  DOB 20-Jan-1941  MRN [id number].  Admission date:  06/25/2013  Admitting Physician  No admitting provider for patient encounter.  Discharge Date:  06/22/2013   Primary MD  Ignatius Specking., MD  Recommendations for primary care physician for things to follow:   Outpatient referral to hospice in 3 weeks time per family request. Once patient qualifies for Medicaid and Medicare.   Admission Diagnosis  Acute respiratory failure [518.81] Hypernatremia [276.0] Aspiration pneumonia [507.0] Sepsis [038.9, 995.91]  Discharge Diagnosis   C. difficile colitis, pneumonia, UTI  Principal Problem:   Acute respiratory failure Active Problems:   Diabetes mellitus without complication   NICM (nonischemic cardiomyopathy)   COPD (chronic obstructive pulmonary disease)   Dysphagia   possible Aspiration pneumonia   Hypernatremia   Protein-calorie malnutrition, severe   Normocytic anemia   Sepsis   PEG (percutaneous endoscopic gastrostomy) status   Nonverbal   Hyperkalemia   Hypokalemia   Candida UTI   Diarrhea   C. difficile colitis      Past Medical History  Diagnosis Date  . Stroke 2011    with right hemiparesis and does not use her left leg  . Diabetes mellitus without complication   . Asthma   . HTN (hypertension)   . Tobacco abuse   . 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 COPD (chronic obstructive pulmonary disease)   . Dysphagia     s/p PEG tube  . Ischemic cardiomyopathy     EF 25% cath 04/2013, grade 1 DD  . Normocytic anemia   . C. difficile colitis 06/27/2013    Past Surgical History  Procedure Laterality Date  . Appendectomy    . Peg placement N/A 05/05/2013    Procedure: PERCUTANEOUS ENDOSCOPIC GASTROSTOMY (PEG) PLACEMENT;  Surgeon: Malissa Hippo, MD;  Location: [facility];  Service: Endoscopy;  Laterality: N/A;  .  Esophagogastroduodenoscopy N/A 05/05/2013    Procedure: ESOPHAGOGASTRODUODENOSCOPY (EGD);  Surgeon: Malissa Hippo, MD;  Location: [facility];  Service: Endoscopy;  Laterality: N/A;     Discharge Condition: Stable however extremely poor long term prognosis, family wants her to be DO NOT RESUSCITATE however IV pressors and antibiotics are okay. They want to pursue hospice once the patient's Medicare and Medicaid qualifications are done.   Follow-up Information   Follow up with VYAS,DHRUV B., MD. Schedule an appointment as soon as possible for a visit in 1 week.   Specialty:  Internal Medicine   Contact information:   [address] [place] [address] [phone]         Consults obtained -     Discharge Medications      Medication List         acetaminophen 160 MG/5ML liquid  Commonly known as:  TYLENOL  Place 500 mg into feeding tube every 4 (four) hours as needed for fever or pain.     acetaminophen 650 MG suppository  Commonly known as:  TYLENOL  Place 650 mg rectally every 4 (four) hours as needed for mild pain or fever.     albuterol (2.5 MG/3ML) 0.083% nebulizer solution  Commonly known as:  PROVENTIL  Take 2.5 mg by nebulization every 4 (four) hours as needed for wheezing or shortness of breath.     amLODipine 5 MG tablet  Commonly known as:  NORVASC  Place 10 mg into feeding tube daily.     aspirin 81 MG chewable tablet  Chew 81 mg by mouth daily.     atorvastatin 10 MG tablet  Commonly known as:  LIPITOR  Take 20 mg by mouth daily.     chlorhexidine 0.12 % solution  Commonly known as:  PERIDEX  Use as directed 15 mLs in the mouth or throat 2 (two) times daily. Please swab in mouth and suction residual.     clopidogrel 75 MG tablet  Commonly known as:  PLAVIX  Place 75 mg into feeding tube daily with breakfast.     feeding supplement (PRO-STAT SUGAR FREE 64) Liqd  Place 30 mLs into feeding tube daily.     fluconazole 40 MG/ML suspension   Commonly known as:  DIFLUCAN  Take 10 mLs (400 mg total) by mouth daily. 7 more days     free water Soln  Place 250 mLs into feeding tube every 6 (six) hours.     insulin glargine 100 UNIT/ML injection  Commonly known as:  LANTUS  Inject 10 Units into the skin at bedtime.     levofloxacin 750 MG tablet  Commonly known as:  LEVAQUIN  Take 1 tablet (750 mg total) by mouth daily. 2 more days     metFORMIN 500 MG tablet  Commonly known as:  GLUCOPHAGE  Place 500 mg into feeding tube 2 (two) times daily with a meal.     multivitamin Liqd  Place 5 mLs into feeding tube daily.     pantoprazole sodium 40 mg/20 mL Pack  Commonly known as:  PROTONIX  Place 20 mLs (40 mg total) into feeding tube daily.     potassium chloride 20 MEQ packet  Commonly known as:  KLOR-CON  Place 40 mEq into feeding tube daily.     vancomycin 50 mg/mL oral solution  Commonly known as:  VANCOCIN  Take 2.5 mLs (125 mg total) by mouth 4 (four) times daily. 7 more days         Diet and Activity recommendation: See Discharge Instructions below   Discharge Instructions     Follow with Primary MD VYAS,DHRUV B., MD in 7 days   Get CBC, CMP, checked 7 days by Primary MD and again as instructed by your Primary MD. Get a 2 view Chest X ray done next visit.  Get Medicines reviewed and adjusted.  Please request your Prim.MD to go over all Hospital Tests and Procedure/Radiological results at the follow up, please get all Hospital records sent to your Prim MD by signing hospital release before you go home.  Activity: As tolerated with Full fall precautions use walker/cane & assistance as needed   Diet: All diet and medications through PEG tube. Nothing by mouth.  For Heart failure patients - Check your Weight same time everyday, if you gain over 2 pounds, or you develop in leg swelling, experience more shortness of breath or chest pain, call your Primary MD immediately. Follow Cardiac Low Salt Diet and 1.8  lit/day fluid restriction.  Disposition SNF  If you experience worsening of your admission  symptoms, develop shortness of breath, life threatening emergency, suicidal or homicidal thoughts you must seek medical attention immediately by calling 911 or calling your MD immediately  if symptoms less severe.  You Must read complete instructions/literature along with all the possible adverse reactions/side effects for all the Medicines you take and that have been prescribed to you. Take any new Medicines after you have completely understood and accpet all the possible adverse reactions/side effects.   Do not drive and provide baby sitting services if your were admitted for syncope or siezures until you have seen by Primary MD or a Neurologist and advised to do so again.  Do not drive when taking Pain medications.    Do not take more than prescribed Pain, Sleep and Anxiety Medications  Special Instructions: If you have smoked or chewed Tobacco  in the last 2 yrs please stop smoking, stop any regular Alcohol  and or any Recreational drug use.  Wear Seat belts while driving.   Please note  You were cared for by a hospitalist during your hospital stay. If you have any questions about your discharge medications or the care you received while you were in the hospital after you are discharged, you can call the unit and asked to speak with the hospitalist on call if the hospitalist that took care of you is not available. Once you are discharged, your primary care physician will handle any further medical issues. Please note that NO REFILLS for any discharge medications will be authorized once you are discharged, as it is imperative that you return to your primary care physician (or establish a relationship with a primary care physician if you do not have one) for your aftercare needs so that they can reassess your need for medications and monitor your lab values.     Major procedures and Radiology  Reports - PLEASE review detailed and final reports for all details, in brief -       Dg Chest 1 View  06/03/2013   CLINICAL DATA:  Increase congestion.  EXAM: CHEST - 1 VIEW  COMPARISON:  05/16/2013  FINDINGS: Minimal linear densities in the lung bases, likely scarring, stable. Heart is normal size. No acute airspace opacities or effusions. No acute bony abnormality.  IMPRESSION: No active disease.   Electronically Signed   By: Charlett Nose M.D.   On: 06/03/2013 20:39   Ct Angio Chest Pe W/cm &/or Wo Cm  06/05/2013   CLINICAL DATA:  Shortness of breath. Elevated D-dimer. Acute respiratory failure. COPD an ischemic cardiomyopathy.  EXAM: CT ANGIOGRAPHY CHEST WITH CONTRAST  TECHNIQUE: Multidetector CT imaging of the chest was performed using the standard protocol during bolus administration of intravenous contrast. Multiplanar CT image reconstructions including MIPs were obtained to evaluate the vascular anatomy.  CONTRAST:  OMNIPAQUE IOHEXOL 350 MG/ML SOLN  COMPARISON:  04/13/2013  FINDINGS: Satisfactory opacification of pulmonary arteries noted, and no pulmonary emboli identified. No evidence of thoracic aortic dissection or aneurysm. No evidence of mediastinal hematoma or mass. Shotty mediastinal and hilar lymph nodes remain stable, with largest precarinal lymph node measuring 8 mm. No pathologically enlarged lymph nodes identified.  No evidence of pleural or pericardial effusion. Mild pulmonary emphysema again noted. New airspace disease is seen in the peripheral right lower lobe, suspicious for pneumonia. No evidence of central endobronchial obstruction. A 3 mm subpleural nodule in the right middle lobe on image 56 remains stable. No other suspicious pulmonary nodules or masses are identified.  Review of the MIP  images confirms the above findings.  IMPRESSION: No evidence of pulmonary embolism.  New right lower lobe airspace disease, suspicious for pneumonia. Recommend short-term radiographic  followup in several weeks to confirm resolution.  Mild emphysema.   Electronically Signed   By: Myles Rosenthal M.D.   On: 06/05/2013 23:40   Portable Chest 1 View  06/26/2013   CLINICAL DATA:  Pneumonia.  Acute respiratory failure.  EXAM: PORTABLE CHEST - 1 VIEW  COMPARISON:  06/25/2013 and 06/10/1999  FINDINGS: There is new slight right base atelectasis. Slight discoid atelectasis at the left base is unchanged. Heart size and pulmonary vascularity normal. No acute osseous abnormality.  IMPRESSION: New slight atelectasis at the right base.   Electronically Signed   By: Geanie Cooley M.D.   On: 06/26/2013 08:02   Dg Chest Portable 1 View  06/25/2013   CLINICAL DATA:  Cough, congestion  EXAM: PORTABLE CHEST - 1 VIEW  COMPARISON:  06/09/2013  FINDINGS: Cardiac silhouette is in normal limits. Low lung volumes. Ill-defined area of increased density with linear components projecting region of the left lung base, costophrenic angle region. There is mild prominence of the interstitial markings. The osseous structures unremarkable.  IMPRESSION: Discoid atelectasis versus mild infiltrate left lower lobe. Pulmonary vascular congestion with likely underlying component pulmonary fibrosis.   Electronically Signed   By: Salome Holmes M.D.   On: 06/25/2013 11:37   Dg Chest Portable 1 View  06/09/2013   CLINICAL DATA:  Short of breath  EXAM: PORTABLE CHEST - 1 VIEW  COMPARISON:  06/05/2013  FINDINGS: Normal heart size. Lungs are under aerated. Hazy opacity at both lung bases likely represents atelectasis. No pneumothorax. Chronic appearing left-sided rib deformities.  IMPRESSION: Bibasilar atelectasis.   Electronically Signed   By: Maryclare Bean M.D.   On: 06/09/2013 21:41   Dg Chest Portable 1 View  06/05/2013   CLINICAL DATA:  Respiratory distress, history of stroke and diabetes, hypertension.  EXAM: PORTABLE CHEST - 1 VIEW  COMPARISON:  Chest radiograph June 03, 2013  FINDINGS: Cardiomediastinal silhouette is  nonsuspicious, mildly calcified aortic knob. Similar mild chronic interstitial changes and pulmonary hyperexpansion without superimposed pleural effusions or focal consolidations. No pneumothorax. Osteopenia. Multiple EKG lines overlie the patient and may obscure subtle underlying pathology. Soft tissue planes are nonsuspicious.  IMPRESSION: Emphysematous changes without superimposed acute cardiopulmonary process.   Electronically Signed   By: Awilda Metro   On: 06/05/2013 03:35    Micro Results      Recent Results (from the past 240 hour(s))  CULTURE, BLOOD (ROUTINE X 2)     Status: None   Collection Time    06/25/13 11:49 AM      Result Value Range Status   Specimen Description BLOOD LEFT ANTECUBITAL   Final   Special Requests BOTTLES DRAWN AEROBIC AND ANAEROBIC 8CC EACH   Final   Culture NO GROWTH 3 DAYS   Final   Report Status PENDING   Incomplete  CULTURE, BLOOD (ROUTINE X 2)     Status: None   Collection Time    06/25/13 11:56 AM      Result Value Range Status   Specimen Description BLOOD LEFT HAND   Final   Special Requests BOTTLES DRAWN AEROBIC AND ANAEROBIC 8CC EACH   Final   Culture NO GROWTH 3 DAYS   Final   Report Status PENDING   Incomplete  URINE CULTURE     Status: None   Collection Time    06/25/13  1:00 PM      Result Value Range Status   Specimen Description URINE, CATHETERIZED   Final   Special Requests NONE   Final   Culture  Setup Time     Final   Value: 06/25/2013 21:35     Performed at [facility] Count     Final   Value: >=100,000 COLONIES/ML     Performed at [facility]   Culture     Final   Value: YEAST     Performed at [facility]   Report Status 06/26/2013 FINAL   Final  MRSA PCR SCREENING     Status: None   Collection Time    06/25/13  8:31 PM      Result Value Range Status   MRSA by PCR NEGATIVE  NEGATIVE Final   Comment:            The GeneXpert MRSA Assay (FDA     approved for NASAL specimens      only), is one component of a     comprehensive MRSA colonization     surveillance program. It is not     intended to diagnose MRSA     infection nor to guide or     monitor treatment for     MRSA infections.  CLOSTRIDIUM DIFFICILE BY PCR     Status: None   Collection Time    06/26/13 12:15 PM      Result Value Range Status   C difficile by pcr NEGATIVE  NEGATIVE Final  CLOSTRIDIUM DIFFICILE BY PCR     Status: Abnormal   Collection Time    06/27/13 10:30 AM      Result Value Range Status   C difficile by pcr POSITIVE (*) NEGATIVE Final   Comment: CRITICAL RESULT CALLED TO, READ BACK BY AND VERIFIED WITH:     SCHONEWITZ,L. AT 1403 ON 06/27/2013 BY BAUGHAM,M.  CLOSTRIDIUM DIFFICILE BY PCR     Status: Abnormal   Collection Time    06/29/13  3:56 AM      Result Value Range Status   C difficile by pcr POSITIVE (*) NEGATIVE Final   Comment: CRITICAL RESULT CALLED TO, READ BACK BY AND VERIFIED WITH:     SCHONEWITZ,L. AT 1111 ON 06/29/2013 BY BAUGHAM,M.     History of present illness and  Hospital Course:     Kindly see H&P for history of present illness and admission details, please review complete Labs, Consult reports and Test reports for all details in brief Christine Durham, is a 72 y.o. female, patient with history of CVA causing aphasia, dysphagia requiring PEG tube feeds, bedbound status, COPD, nonischemic cardiomyopathy with chronic systolic CHF last EF 25%, hypertension, type 2 diabetes mellitus non-insulin-dependent, she is a nursing home resident and was admitted to the hospital with sepsis caused by aspiration nosocomial pneumonia.   Sepsis due to aspiration nosocomial pneumonia, now stable, continue aspiration precautions, all food and medications through PEG tube, elevate head of the bed and 60 at all time, we'll complete antibiotic treatment with Levaquin for 2 more days.   C. difficile colitis. Improving on oral medical myosin. We'll prescribe for 7 more days, if she still  having diarrhea this treatment can be extended further until diarrhea resolves.   Candida UTI. Will give her Diflucan for 7 more days.   Her COPD, history of CVA with chronic bedbound status, aphasia, dysphagia requiring PEG tube feeds, nonischemic cardiomyopathy with chronic  systolic CHF EF 25%, hypertension, diabetes mellitus type 2. She stable and she will home medications unchanged, we'll defer to PCP primary cardiologist on starting on ACE/ARB, beta blocker if appropriate.   I had detailed discussions with patient's sister Ms. Trunnell who is the primary decision maker, at this time she wants her to be DO NOT RESUSCITATE except for IV pressors and antibiotics if needed, she wants to pursue hospice status once the patient's Medicare and Medicaid issues are resolved. She says that in the next 3 weeks patient should qualify for Medicare Medicaid and at that time they want to pursue hospice.     Today   Subjective:   Christine Durham today mains nonverbal, appears in no discomfort.  Objective:   Blood pressure 133/53, pulse 97, temperature 98.4 F (36.9 C), temperature source Axillary, resp. rate 17, height 5\' 6"  (1.676 m), weight 52.753 kg (116 lb 4.8 oz), SpO2 100.00%.   Intake/Output Summary (Last 24 hours) at 06/13/2013 1136 Last data filed at 06/21/2013 0503  Gross per 24 hour  Intake   5830 ml  Output      0 ml  Net   5830 ml    Exam Awake Alert, Oriented *3, No new F.N deficits, Normal affect [place].AT,PERRAL Supple Neck,No JVD, No cervical lymphadenopathy appriciated.  Symmetrical Chest wall movement, Good air movement bilaterally, CTAB RRR,No Gallops,Rubs or new Murmurs, No Parasternal Heave +ve B.Sounds, Abd Soft, Non tender, No organomegaly appriciated, No rebound -guarding or rigidity. No Cyanosis, Clubbing or edema, No new Rash or bruise  Data Review   CBC w Diff: Lab Results  Component Value Date   WBC 10.4 06/18/2013   HGB 10.2* 06/29/2013   HCT 33.0* 06/23/2013    PLT 314 07/08/2013   LYMPHOPCT 16 06/16/2013   BANDSPCT 8 06/25/2013   MONOPCT 6 06/19/2013   EOSPCT 12* 07/04/2013   BASOPCT 0 06/25/2013    CMP: Lab Results  Component Value Date   NA 139 07/06/2013   NA 137 07/07/2013   K 4.6 06/20/2013   K 4.5 06/29/2013   CL 102 06/13/2013   CL 101 07/09/2013   CO2 26 07/11/2013   CO2 27 06/24/2013   BUN 12 06/16/2013   BUN 12 06/29/2013   CREATININE 0.41* 06/29/2013   CREATININE 0.43* 07/07/2013   PROT 7.8 06/25/2013   ALBUMIN 2.7* 07/03/2013   BILITOT 0.3 06/25/2013   ALKPHOS 124* 06/25/2013   AST 16 06/25/2013   ALT 15 06/25/2013  .   Total Time in preparing paper work, data evaluation and todays exam - 35 minutes  Leroy Sea M.D on 06/12/2013 at 11:36 AM  [facility] Office  [phone]

## 2013-06-30 NOTE — Progress Notes (Signed)
Pt stable, report called to Evangeline Gula at the [facility]

## 2013-06-30 NOTE — Progress Notes (Signed)
Inpatient Diabetes Program Recommendations  AACE/ADA: New Consensus Statement on Inpatient Glycemic Control (2013)  Target Ranges:  Prepandial:   less than 140 mg/dL      Peak postprandial:   less than 180 mg/dL (1-2 hours)      Critically ill patients:  140 - 180 mg/dL    Inpatient Diabetes Program Recommendations Insulin - Basal: Please conisder increasing Lantus to 12 units QHS. Correction (SSI): Please order Novolog moderate correction scale Q4H (in addition to Novolog 2 units Q4H for tube feeding coverage).  Note:  Inpatient diabetes coordinator requested Novolog 2 units tube feeding coverage Q4H yesterday.  Tube feeding was ordered as requested and Novolog correction scale was discontinued at that time.  Patient will need Novolog moderate correction Q4H in addition to tube feeding coverage.  Also, please consider increasing Lantus to 12 units QHS.  Will continue to follow.    Thanks, Orlando Penner, RN, MSN, CCRN Diabetes Coordinator Inpatient Diabetes Program [phone] (Team Pager) [phone] ([facility] office) [phone] [facility] office)

## 2013-06-30 NOTE — Progress Notes (Signed)
Nutrition Follow-up  INTERVENTION:  Continuous Glucerna 1.2  @ 55 ml/hr via PEG with 30 ml Prostat daily.    Free water 50 q 4 r via PEG  IVF-1/2 NS @ 50 ml/hr = 1200 ml q 24 hr  NUTRITION DIAGNOSIS: Malnutrition; ongoing  Goal: Pt to meet >/= 90% of their estimated nutrition needs; met  Monitor:  EN tolerance, labs, changes in status   72 y.o. female  Admitting Dx: Acute respiratory failure  ASSESSMENT:   Pt is afebrile, antibiotics related tx-Candida UTI. BM x 1 this a.m. Tolerating TF with 30 ml residuals this morning. Hyperglycemia noted -diabetes coordinator following.   Height: Ht Readings from Last 1 Encounters:  06/25/13 5\' 6"  (1.676 m)    Weight: Wt Readings from Last 1 Encounters:  07-09-2013 116 lb 4.8 oz (52.753 kg)    Ideal Body Weight: 130# (59 kg)  % Ideal Body Weight: 95%  Wt Readings from Last 10 Encounters:  07-09-13 116 lb 4.8 oz (52.753 kg)  06/09/13 119 lb (53.978 kg)  06/08/13 119 lb 0.8 oz (54 kg)  05/09/13 119 lb 7.8 oz (54.2 kg)  05/09/13 119 lb 7.8 oz (54.2 kg)  04/12/13 123 lb 10.9 oz (56.1 kg)  04/10/13 133 lb (60.328 kg)  04/10/13 133 lb (60.328 kg)  03/29/13 128 lb (58.06 kg)  03/25/13 128 lb 14.4 oz (58.469 kg)    Usual Body Weight: 130-133#  % Usual Body Weight: 95%  BMI:  Body mass index is 18.78 kg/(m^2).normal range  Estimated Nutritional Needs: Kcal: 1500-1800 Protein: 73-95 gr Fluid: 1800 ml daily  Skin: intact  Diet Order: NPO  EDUCATION NEEDS: -Education needs addressed   Intake/Output Summary (Last 24 hours) at 07/09/13 0931 Last data filed at 07/09/2013 0503  Gross per 24 hour  Intake   5830 ml  Output      0 ml  Net   5830 ml    Last BM: Jul 09, 2013 large, brown, watery stool x 1  Labs:   Recent Labs Lab 06/26/13 0804  06/28/13 0537 06/29/13 0539 2013-07-09 0606  NA 145  < > 141 136 139  137  K 3.1*  < > 3.6 4.2 4.6  4.5  CL 110  < > 106 101 102  101  CO2 25  < > 27 27 26  27   BUN 19   < > 6 8 12  12   CREATININE 0.46*  < > 0.48* 0.50 0.41*  0.43*  CALCIUM 8.8  < > 8.8 9.4 9.7  9.7  MG 2.0  --   --   --   --   PHOS  --   --   --   --  3.7  GLUCOSE 222*  < > 158* 223* 275*  280*  < > = values in this interval not displayed.  CBG (last 3)   Recent Labs  06/29/13 2355 2013-07-09 0406 2013/07/09 0729  GLUCAP 211* 277* 248*    Scheduled Meds: . albuterol  2.5 mg Nebulization Q6H  . antiseptic oral rinse  15 mL Mouth Rinse q12n4p  . aspirin  81 mg Per Tube Daily  . atorvastatin  20 mg Per Tube Daily  . chlorhexidine  15 mL Mouth Rinse BID  . clopidogrel  75 mg Per Tube Q breakfast  . enoxaparin (LOVENOX) injection  40 mg Subcutaneous Q24H  . feeding supplement (PRO-STAT SUGAR FREE 64)  30 mL Per Tube Daily  . fluconazole  400 mg Oral Daily  . free water  50 mL Per Tube Q4H  . insulin aspart  2 Units Subcutaneous Q4H  . insulin glargine  10 Units Subcutaneous QHS  . ipratropium  0.5 mg Nebulization Q6H  . levofloxacin  750 mg Oral Daily  . pantoprazole sodium  40 mg Per Tube Daily  . potassium chloride  40 mEq Per Tube BID  . simethicone      . vancomycin  125 mg Oral QID    Continuous Infusions: . feeding supplement (GLUCERNA 1.2 CAL) 1,000 mL (07-10-13 0503)  . sodium chloride 0.45 % with kcl 50 mL/hr at 06/29/13 1039    Past Medical History  Diagnosis Date  . Stroke 2011    with right hemiparesis and does not use her left leg  . Diabetes mellitus without complication   . Asthma   . HTN (hypertension)   . Tobacco abuse   . 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 COPD (chronic obstructive pulmonary disease)   . Dysphagia     s/p PEG tube  . Ischemic cardiomyopathy     EF 25% cath 04/2013, grade 1 DD  . Normocytic anemia   . C. difficile colitis 06/27/2013    Past Surgical History  Procedure Laterality Date  . Appendectomy    . Peg placement N/A 05/05/2013    Procedure: PERCUTANEOUS ENDOSCOPIC GASTROSTOMY (PEG) PLACEMENT;  Surgeon: Malissa Hippo, MD;  Location: [facility];  Service: Endoscopy;  Laterality: N/A;  . Esophagogastroduodenoscopy N/A 05/05/2013    Procedure: ESOPHAGOGASTRODUODENOSCOPY (EGD);  Surgeon: Malissa Hippo, MD;  Location: [facility];  Service: Endoscopy;  Laterality: N/A;    Royann Shivers MS,RD,CSG,LDN Office: [phone] Pager: [phone]

## 2013-06-30 NOTE — Clinical Social Work Note (Signed)
Patient ready for discharge today, will return to [facility] w [facility] staff via tunnel.  FL2 reviewed w RN and updated as needed.  [facility] notified patient positive for C diff. VM left for admissions, discharge summary faxed to facility via [facility].  Discharge packet prepared and given to RN.  CSW spoke w sister, Justyce Yeater, sister agreeable to transfer.  Discussed sister/POAs wish to discuss do not hospitalize and comfort measures for patient when in respiratory distress w [facility] staff - they will confer and continue to discuss plan of care w family.  CSW signing off as no further SW needs identified.  Santa Genera, LCSW Clinical Social Worker [phone])

## 2013-07-01 LAB — GLUCOSE, CAPILLARY
Glucose-Capillary: 269 mg/dL — ABNORMAL HIGH (ref 70–99)
Glucose-Capillary: 264 mg/dL — ABNORMAL HIGH (ref 70–99)

## 2013-07-02 ENCOUNTER — Non-Acute Institutional Stay (SKILLED_NURSING_FACILITY): Payer: Medicare Other | Admitting: Internal Medicine

## 2013-07-02 DIAGNOSIS — I639 Cerebral infarction, unspecified: Secondary | ICD-10-CM

## 2013-07-02 DIAGNOSIS — I509 Heart failure, unspecified: Secondary | ICD-10-CM

## 2013-07-02 DIAGNOSIS — J449 Chronic obstructive pulmonary disease, unspecified: Secondary | ICD-10-CM

## 2013-07-02 DIAGNOSIS — I6350 Cerebral infarction due to unspecified occlusion or stenosis of unspecified cerebral artery: Secondary | ICD-10-CM

## 2013-07-02 DIAGNOSIS — I5023 Acute on chronic systolic (congestive) heart failure: Secondary | ICD-10-CM

## 2013-07-02 DIAGNOSIS — E119 Type 2 diabetes mellitus without complications: Secondary | ICD-10-CM

## 2013-07-02 DIAGNOSIS — J690 Pneumonitis due to inhalation of food and vomit: Secondary | ICD-10-CM

## 2013-07-02 DIAGNOSIS — R627 Adult failure to thrive: Secondary | ICD-10-CM

## 2013-07-02 LAB — GLUCOSE, CAPILLARY: Glucose-Capillary: 285 mg/dL — ABNORMAL HIGH (ref 70–99)

## 2013-07-02 NOTE — Progress Notes (Signed)
Patient ID: Christine Durham, female   DOB: 1941-04-14, 72 y.o.   MRN: [id number]  This is an acute visit.  Level of care skilled.  Facility [facility].  Chief complaint-acute visit secondary to recent hospitalization for aspiration pneumonia-complicated with COPD CHF and CVA history.  History of present illness.  Patient is a 72 year old female who has had a very complicated course.  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  This has been complicated with recurrent COPD exasperations and pneumonia-of fact she has just returned as of the 19th from the hospital with the diagnosis of aspiration pneumonia--he continues on Levaquin----she also apparently developed C. difficile-and is on vancomycin-also felt to have candidiasis and is on Diflucan.  She has not been doing well-and as of yesterday Roxanol--5 mg every 2 hours when necessary was started and an Ativan was started every 6 hours when necessary  Today she continues to decline with pulse 130-O2 saturation originally was in the 90s on oxygen however this has declined down to the 70s.  -Her breathing is somewhat labored.  She sounds quite congested.  Yesterday family indicated desires for comfort care and these orders were written.  On-call provider also ordered Xopenex nebulizer secondary to her elevated pulse rate-she had been on albuterol-however Xopenex is not available at this time in the facility and we will switch this to Atrovent for now  When I reevaluated resident today she actually has had a vomiting episode-she is on tube feedings and we will hold this.  Family medical social history has been reviewed per admission note on 06/12/2013 as well as hospital discharge summary 2013/07/12.  Medications have been reviewed per MAR--in addition to the Roxanol and Ativan  and nebulizers she is on  Tylenol-Norvasc-aspirin-Lipitor-Plavix-Diflucan-Lantus-Glucophage-potassium-Protonix-vancomycin-Levaquin-  Review of systems-not obtainable secondary to patient's aphasic status-per family and nursing staff she appears to be declining and I suspect may be in the process of actively dying.  Physical exam.  Temp 97.5 pulse on recheck was 120-blood pressure 179/85-temperature 97.5 axillary gas O2 saturation originally in the 90s now in the low 70s on oxygen-- respirations are 40.  In general this is an extremely frail elderly female appears to be mildly uncomfortable.  Her skin is warm and dry somewhat diaphoretic.  Chest has diffuse congestion there is somelabored breathing and elevated respiratory rate.  Heart difficult to auscultate secondary to congested breath sounds-radial pulse is 120.  And regular.  Abdomen is soft does not appear to be tender there are active bowel sounds PEG site appears remarkable.  Muscle skeletal does have left-sided hemiparesis with some edema of her left hand.  She has general frailty I do not see any edema of her lower extremities   Neurologic-as stated above left-sided hemiparesis.  Psych she is  aphasic but is alert.  2013-07-12.  Sodium 139 potassium 4.6 BUN 12 creatinine 0.41-albumin 2.7.  CBC 10.4 hemoglobin 10.2 platelets 364.  Assessment and plan.  #1-failure to thrive hypoxia aspiration pneumonia-this is all complicated with a history of a significant CVA COPD-patient is not doing well-her responsible party her sister is in the room-she expressed desires yesterday for comfort care-I did speak with her today to confirm this.  I did state her sister appears to be actively dying-she expressed understanding and wishes for  comfort care no aggressive treatment-in the interest of comfort Will discontinue most of her medications will continue her Atrovent nebulizers as well as the Roxanol  and Ativan -Will change-Roxanol 5 mg every one hour when  necessary and Ativan to every 2 hours when necessary Continue supportive oxygen as well.  Also will hold tube feedings and discontinue the low-dose Lantus-Will treat with a conservative NovoLog sliding scale.  Addendum-I did reevaluate patient one more time after receiving the Roxanol and Ativan-she appears to be significantly more comfortable-we'll continue with current treatment plan--pulse continues to be somewhat elevated her respirations are now down to 24-her pulse is still elevated but less than originally this morning when it was over 130--- she continues with diffuse congestion but again appears considerably more comfortable  .  [id number] note greater than 45 minutes spent assessing patient--reassessing patient-discussing her status with family as well as nursing staff-and coordinating plan of care.    Marland Kitchen

## 2013-07-03 ENCOUNTER — Non-Acute Institutional Stay (SKILLED_NURSING_FACILITY): Payer: Medicare Other | Admitting: Internal Medicine

## 2013-07-03 ENCOUNTER — Other Ambulatory Visit: Payer: Self-pay | Admitting: *Deleted

## 2013-07-03 DIAGNOSIS — I69959 Hemiplegia and hemiparesis following unspecified cerebrovascular disease affecting unspecified side: Secondary | ICD-10-CM

## 2013-07-03 DIAGNOSIS — J441 Chronic obstructive pulmonary disease with (acute) exacerbation: Secondary | ICD-10-CM

## 2013-07-03 DIAGNOSIS — J690 Pneumonitis due to inhalation of food and vomit: Secondary | ICD-10-CM

## 2013-07-03 DIAGNOSIS — R1319 Other dysphagia: Secondary | ICD-10-CM

## 2013-07-03 LAB — CULTURE, BLOOD (ROUTINE X 2): Culture: NO GROWTH

## 2013-07-03 LAB — GLUCOSE, CAPILLARY
Glucose-Capillary: 243 mg/dL — ABNORMAL HIGH (ref 70–99)
Glucose-Capillary: 244 mg/dL — ABNORMAL HIGH (ref 70–99)

## 2013-07-03 MED ORDER — MORPHINE SULFATE (CONCENTRATE) 20 MG/ML PO SOLN: ORAL | Status: AC

## 2013-07-03 NOTE — Progress Notes (Signed)
Patient ID: Christine Durham, female   DOB: Aug 28, 1940, 72 y.o.   MRN: [id number] Facility; [facility] skilled nursing facility Chief complaint; review status post stay at home how December 14 through December 19 History; the patient was admitted with respiratory distress and was felt to have for aspiration caused by nosocomial pneumonia. I don't see blood cultures in her results however she was noted to have pseudomembranous colitis. She was treated with the vancomycin. Also noted to have hypernatremia in the hospital with a sodium of 150 and significant hypoxemia on arrival to the ER.  Since her arrival back in the facility she is already deteriorated. She uses in respiratory distress so. Over the weekend she was seen by our service and essentially made comfort care with no hospitalizations no labs. Her major disability is severe bihemispheric damage from strokes  Past Medical History  Diagnosis Date  . Stroke 2011    with right hemiparesis and does not use her left leg  . Diabetes mellitus without complication   . Asthma   . HTN (hypertension)   . Tobacco abuse   . 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 COPD (chronic obstructive pulmonary disease)   . Dysphagia     s/p PEG tube  . Ischemic cardiomyopathy     EF 25% cath 04/2013, grade 1 DD  . Normocytic anemia   . C. difficile colitis 06/27/2013   Review of systems; not possible at this time  Physical examination Gen. the patient really looks and difficulty with severe respiratory distress, she is comfort care but looks very calm Respiratory rate 36 O2 sat 93% on nasal cannula oxygen, pulse rate 110 Respiratory she is in obvious respiratory distress with accessory muscle use. Work of breathing is labored. Very poor air entry and severe expiratory wheezing diffusely Cardiac heart sounds are inaudible JVP is not elevated she appears to be euvolemic Abdomen is soft PEG tube feedings of already been stopped no liver no spleen Extremities; no  evidence of a DVT  Impression/plan #1 aspiration pneumonitis in the setting of recurrent aspiration pneumonitis. She is already been designated comfort care and this is entirely reasonable. I suctioned her and gave her a nebulizer I will increase the Roxanol #2 pseudomembranous colitis this is really moot point at this point as her medications have stopped #3 late effects severe bihemispheric damage which is the major issue here  I certainly agree with comfort measures however the patient did not look at all comfortable. Will do what I can to ease her respiratory distress including suctioning nebulizers and Roxanol

## 2013-07-04 LAB — GLUCOSE, CAPILLARY
Glucose-Capillary: 233 mg/dL — ABNORMAL HIGH (ref 70–99)
Glucose-Capillary: 287 mg/dL — ABNORMAL HIGH (ref 70–99)

## 2013-07-13 DEATH — deceased

## 2013-10-04 ENCOUNTER — Other Ambulatory Visit (HOSPITAL_COMMUNITY): Payer: Medicare Other

## 2013-10-04 ENCOUNTER — Ambulatory Visit: Payer: Medicare Other | Admitting: Vascular Surgery

## 2014-04-18 IMAGING — CR DG CHEST 1V
1 series · 1 of 1 positions shown · non-contrast
Comparison: 03/22/2013

CLINICAL DATA: Cough.

EXAM:
CHEST - 1 VIEW

[view not recorded]
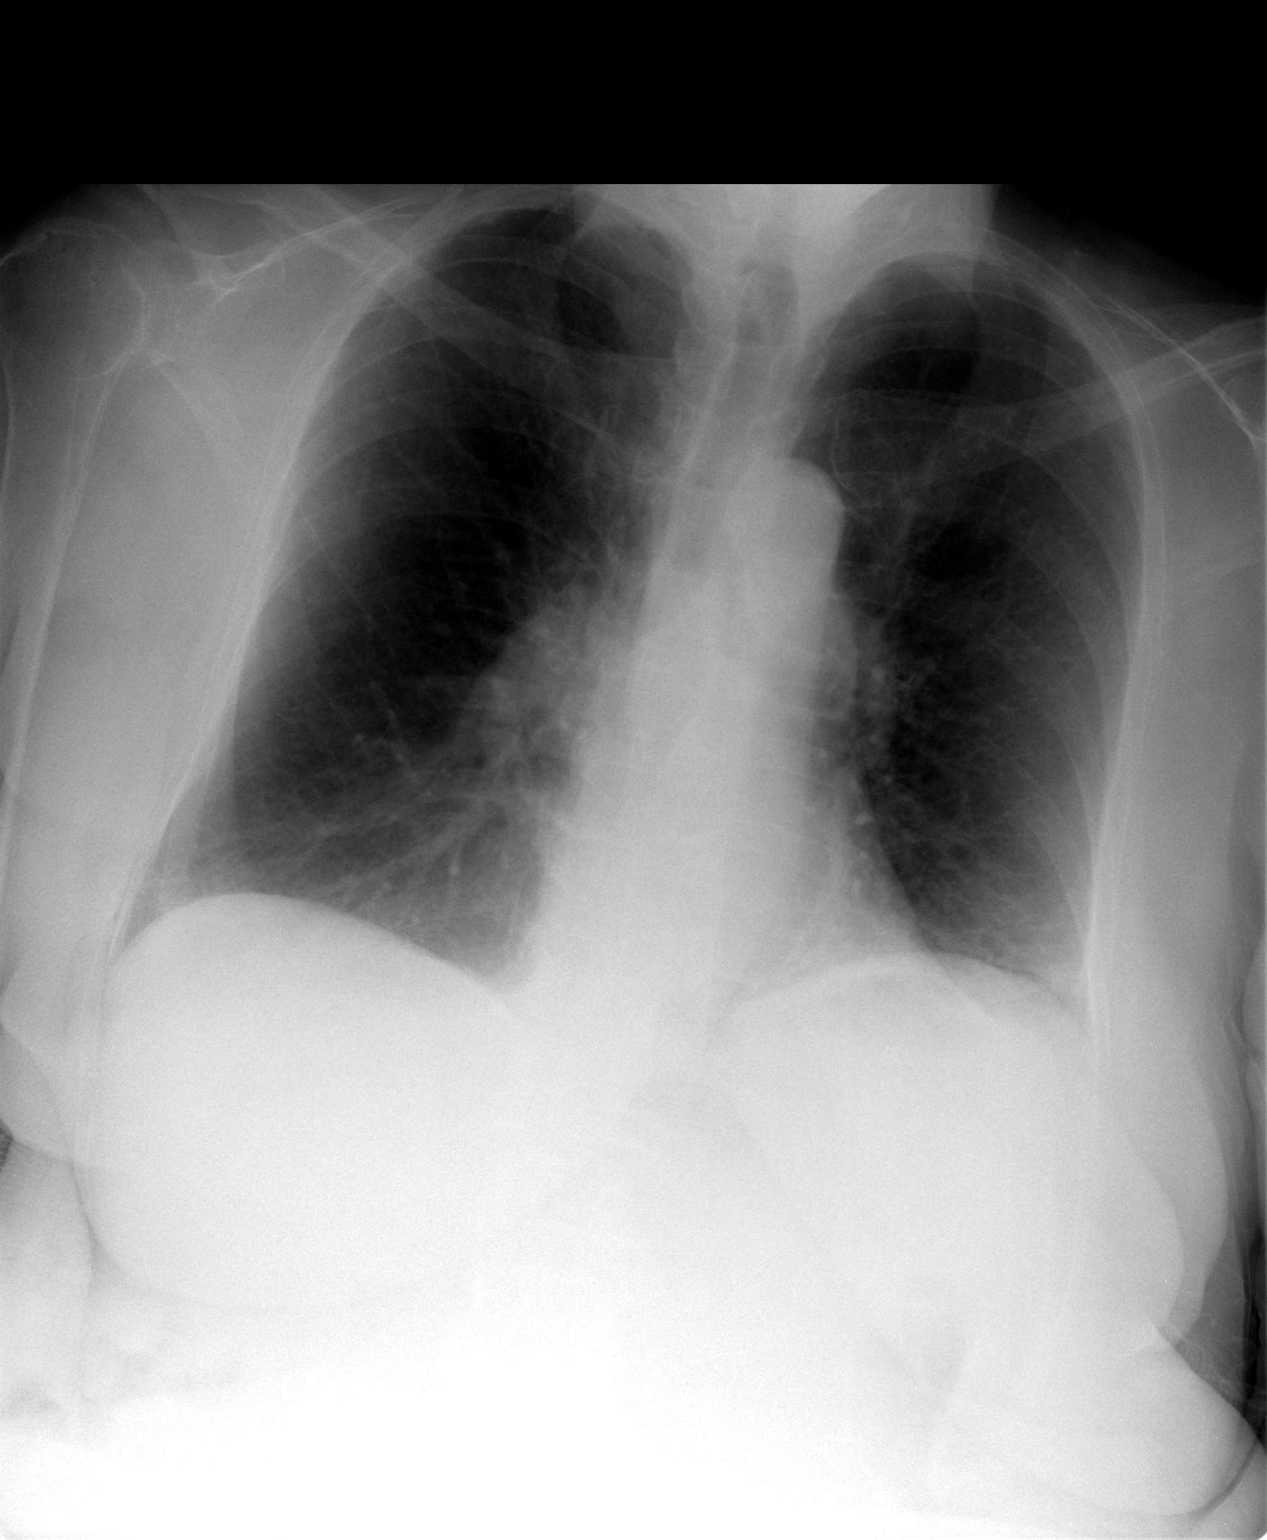

[1 of 1 positions shown; findings below may reference images not displayed]

FINDINGS: The heart size is normal. Main pulmonary arteries are prominent
suggesting pulmonary arterial hypertension. The lungs are somewhat
hyperinflated suggesting emphysema. Tiny area scarring at the left
base laterally. There appears to be a slight compression deformity
of T12, age indeterminate.
IMPRESSION: Probable COPD. Possible compression fracture of T12, age
indeterminate.

## 2014-04-19 IMAGING — CR DG CHEST 1V PORT
1 series · 1 of 1 positions shown · non-contrast
Comparison: Portable exam 0480 hr compared to 04/11/2013

CLINICAL DATA: Extreme weakness, dysphagia, stroke, history asthma,
diabetes, COPD, smoking, MI

EXAM:
PORTABLE CHEST - 1 VIEW

[portable]
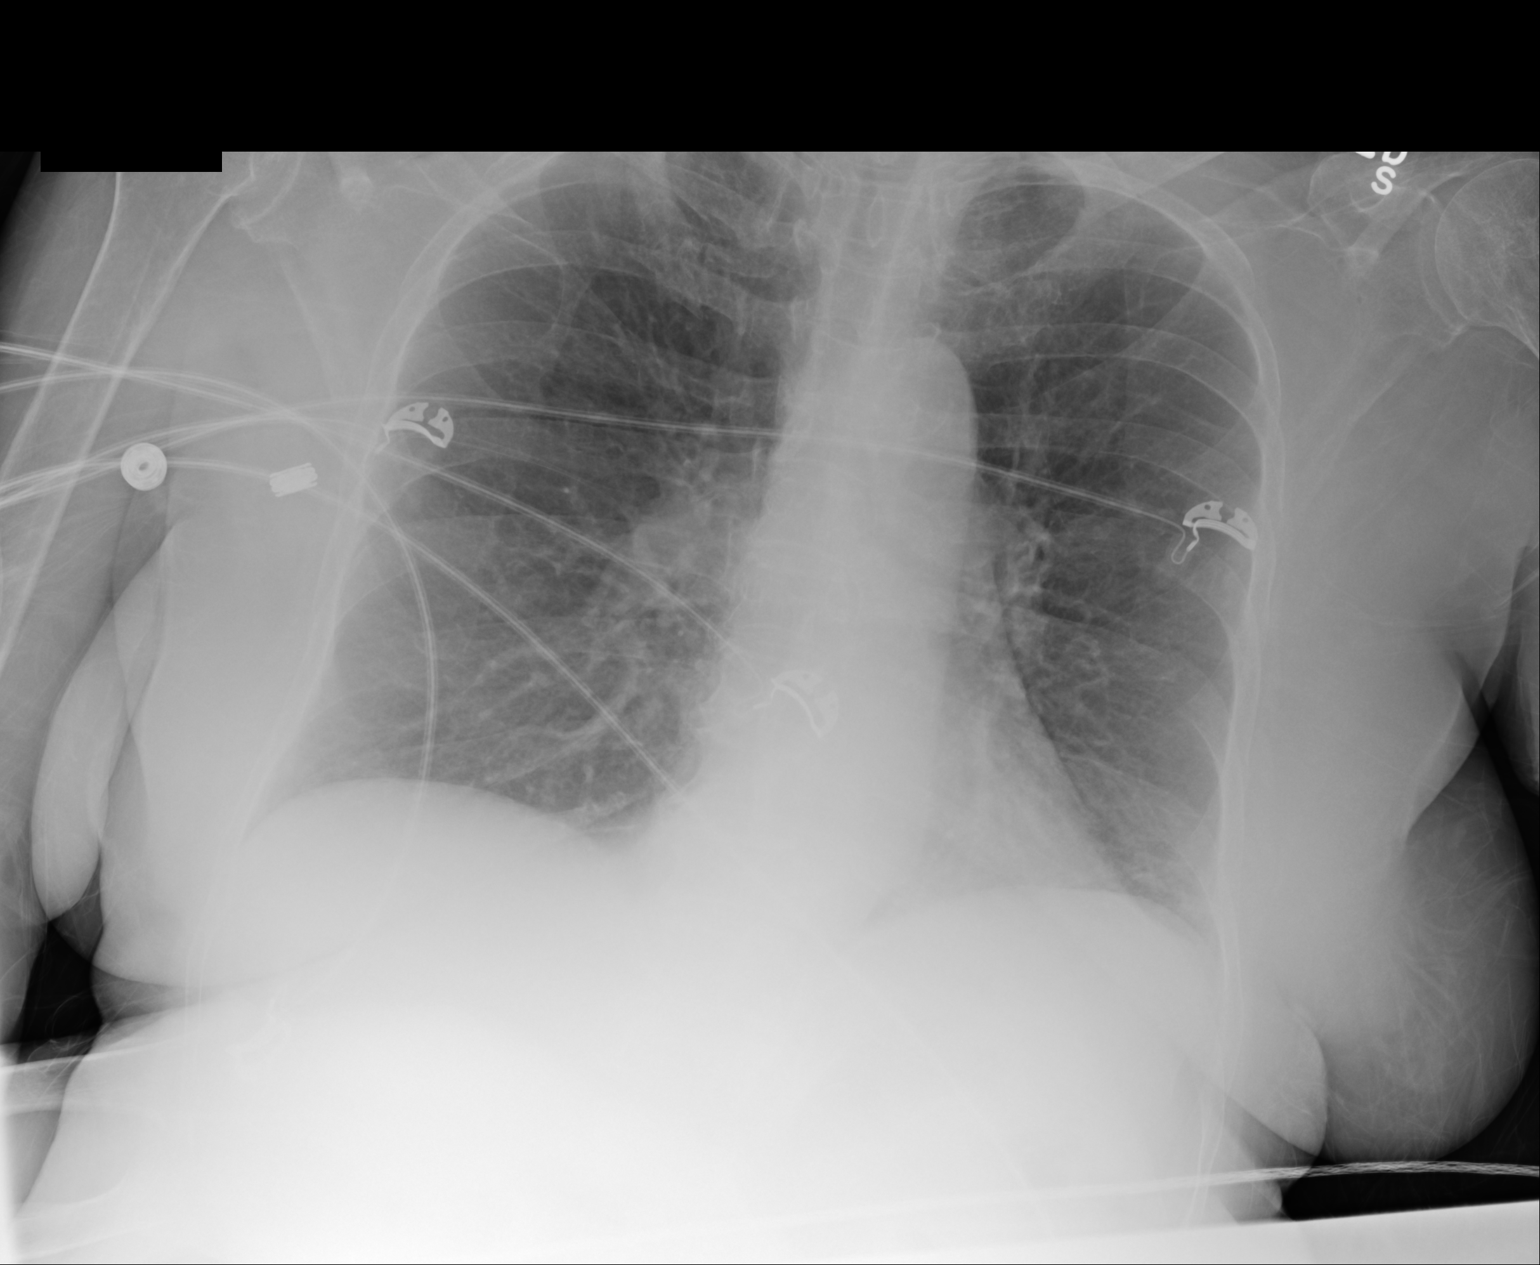

[1 of 1 positions shown; findings below may reference images not displayed]

FINDINGS: Normal heart size, mediastinal contours, and pulmonary vascularity.

Atherosclerotic calcification aorta.

Emphysematous and bronchitic changes consistent with COPD.

No definite infiltrate, pleural effusion, or pneumothorax.

Diffuse osseous demineralization.
IMPRESSION: COPD changes.

No acute abnormalities.

## 2014-05-06 IMAGING — CR DG CHEST 2V
2 series · 2 of 2 positions shown · non-contrast
Comparison: 04/15/2013

CLINICAL DATA: Congestion. Short of breath.

EXAM:
CHEST  2 VIEW

[view not recorded (1 of 2)]
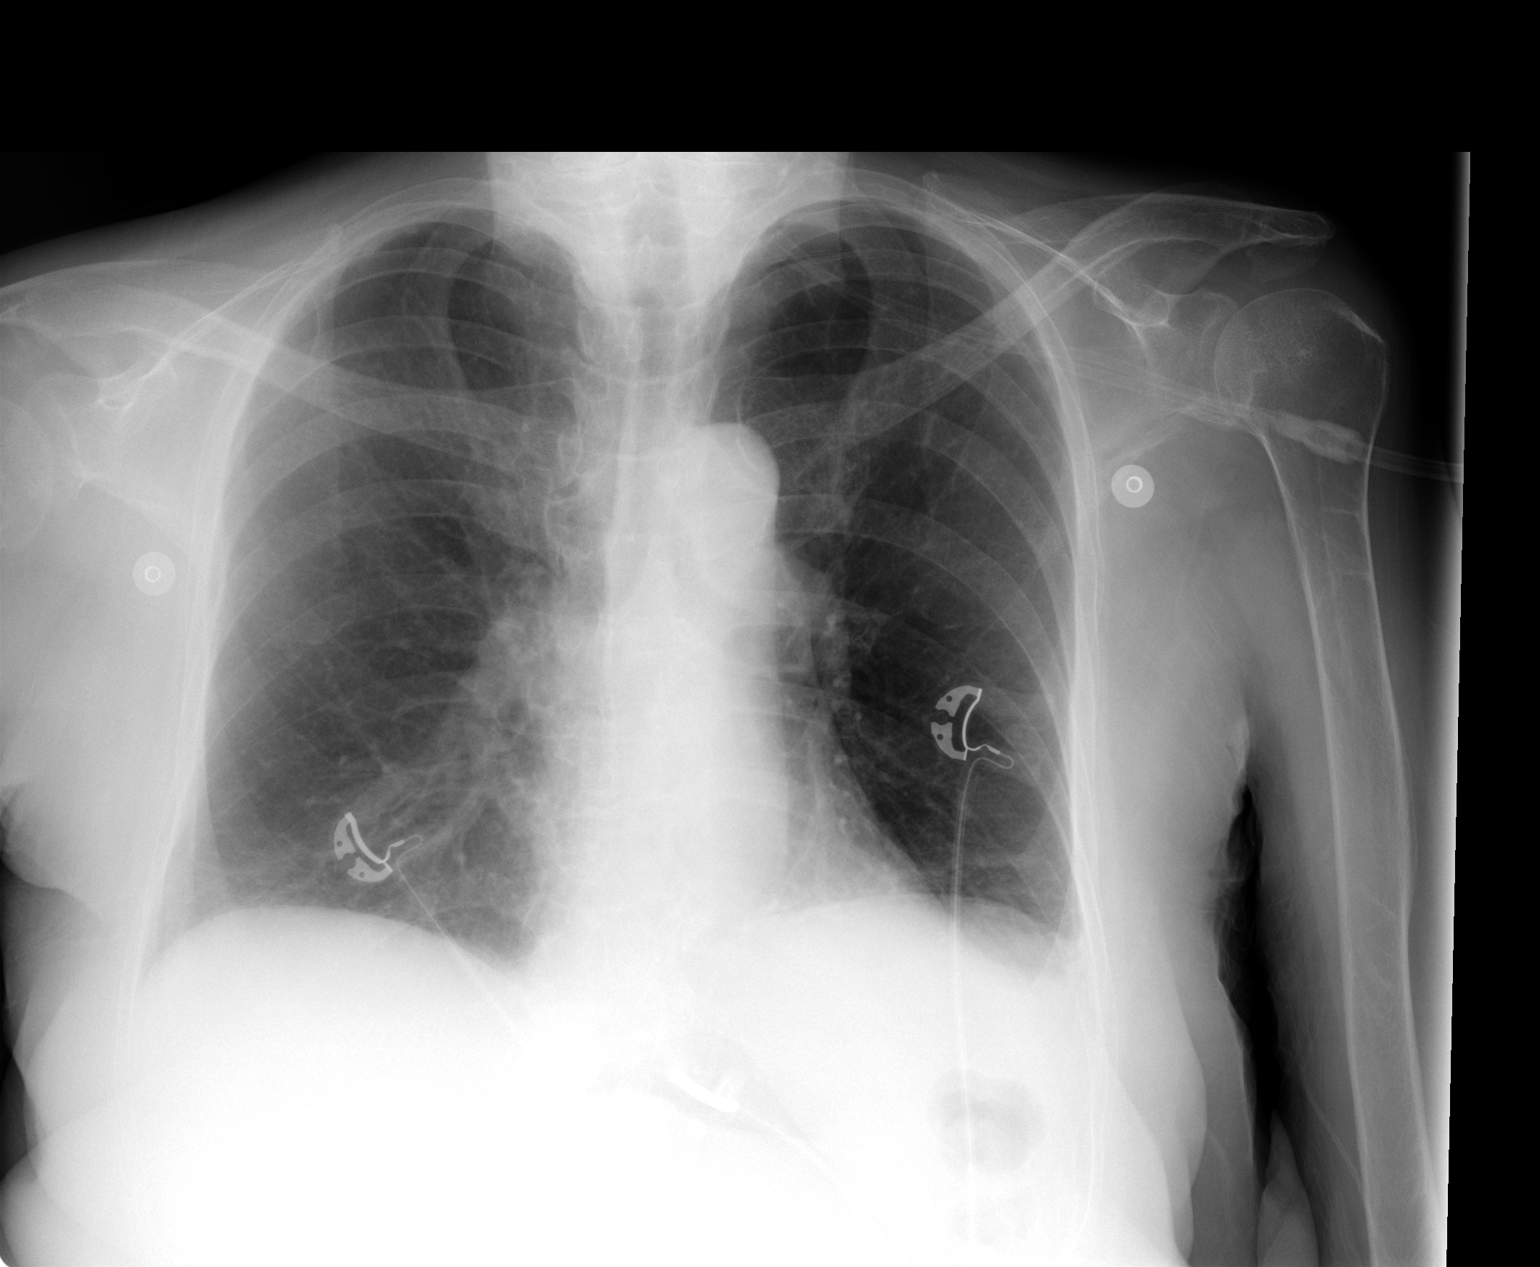

[view not recorded (2 of 2)]
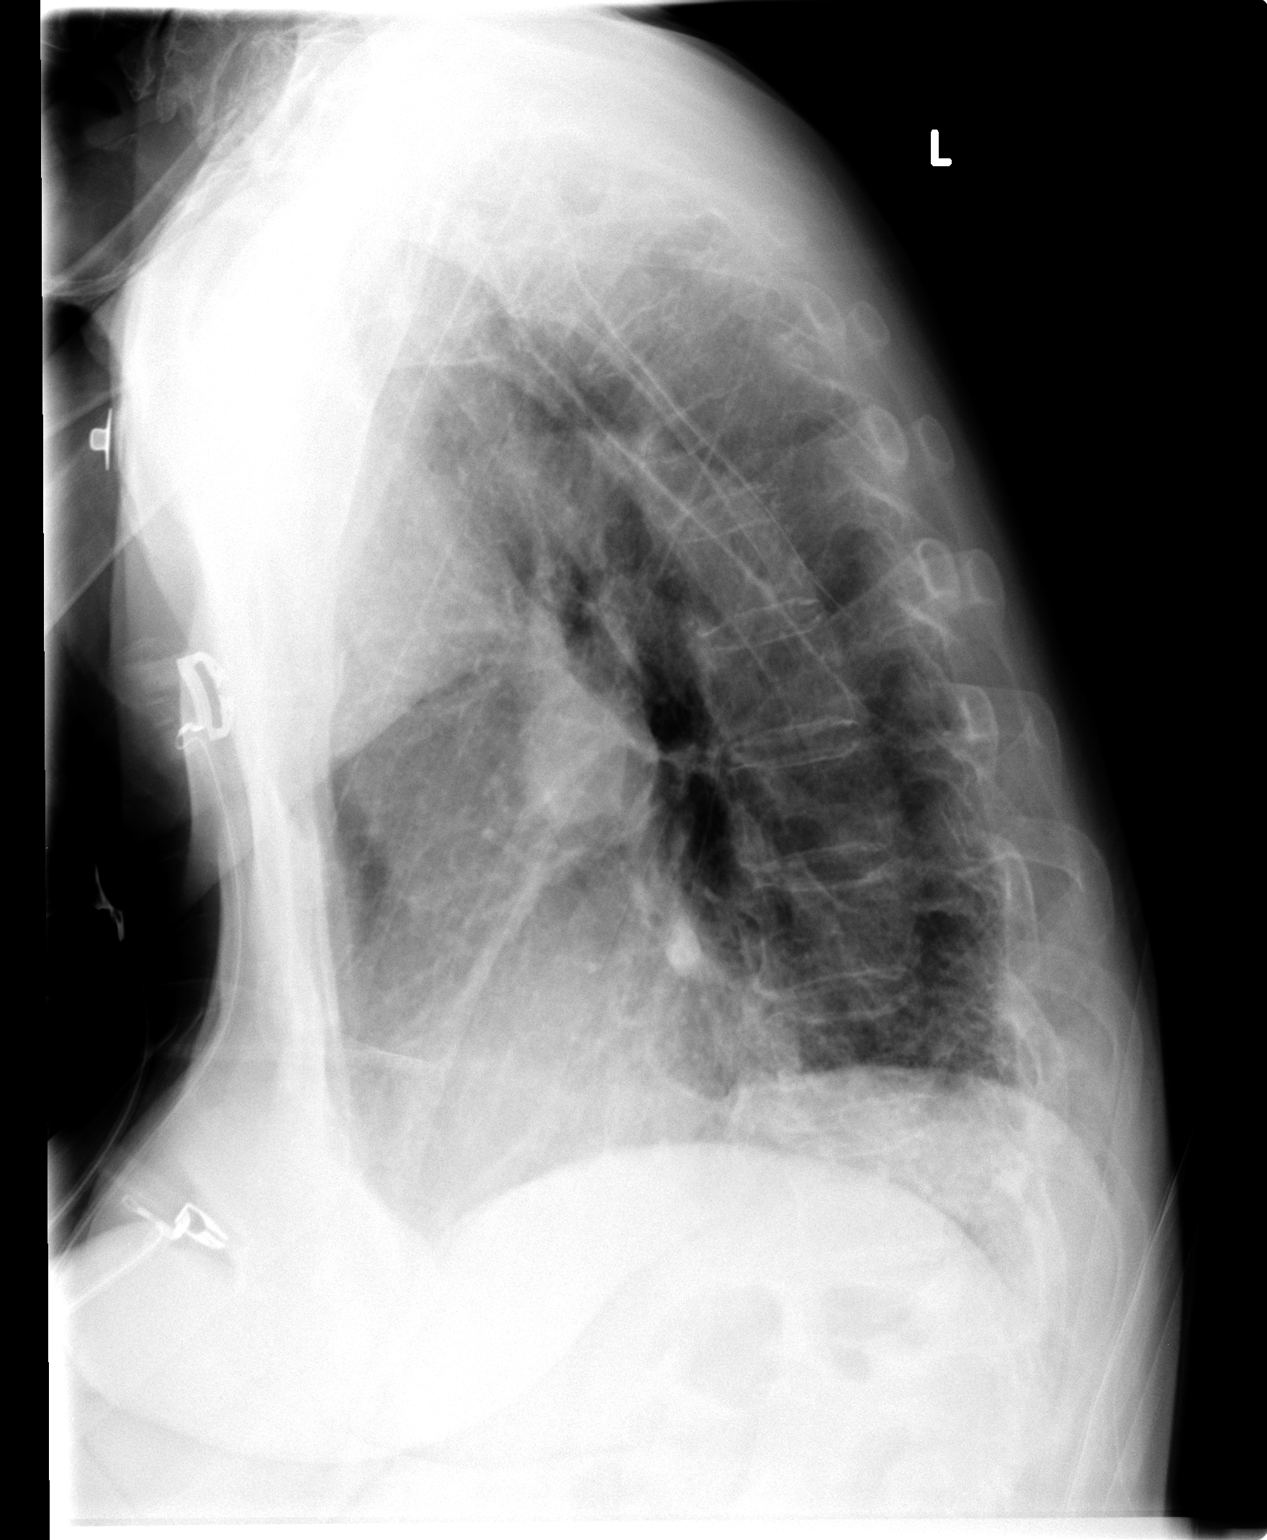

[2 of 2 positions shown; findings below may reference images not displayed]

FINDINGS: Low lung volumes. Upper normal heart size. Upper lungs clear. No
pneumothorax. Stable T12 compression deformity.
IMPRESSION: Bibasilar atelectasis.

## 2014-06-21 ENCOUNTER — Encounter (HOSPITAL_COMMUNITY): Payer: Self-pay | Admitting: Cardiovascular Disease

## 2014-07-02 IMAGING — CR DG CHEST 1V PORT
1 series · 1 of 1 positions shown · non-contrast
Comparison: 06/09/2013

CLINICAL DATA: Cough, congestion

EXAM:
PORTABLE CHEST - 1 VIEW

[portable]
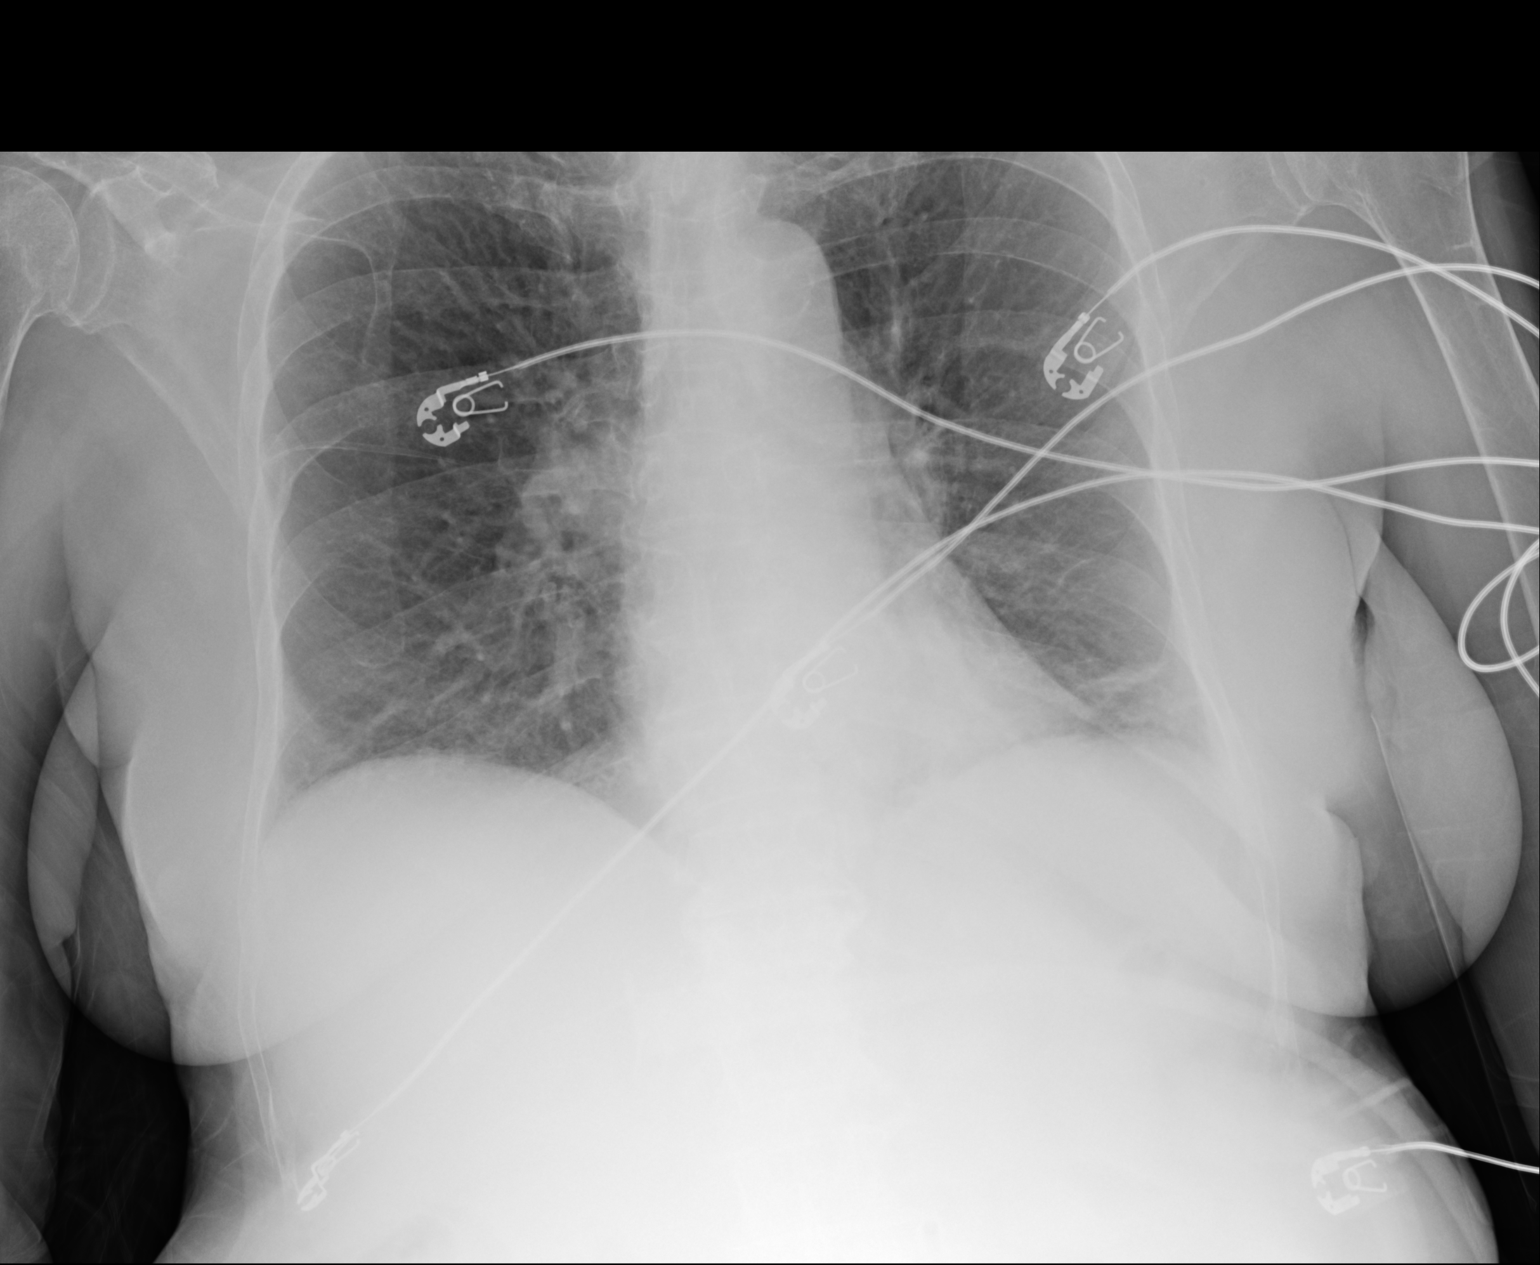

[1 of 1 positions shown; findings below may reference images not displayed]

FINDINGS: Cardiac silhouette is in normal limits. Low lung volumes.
Ill-defined area of increased density with linear components
projecting region of the left lung base, costophrenic angle region.
There is mild prominence of the interstitial markings. The osseous
structures unremarkable.
IMPRESSION: Discoid atelectasis versus mild infiltrate left lower lobe.
Pulmonary vascular congestion with likely underlying component
pulmonary fibrosis.
